# Patient Record
Sex: Male | Born: 1935 | Race: White | Hispanic: No | Marital: Married | Smoking: Former smoker
Health system: Southern US, Community
[De-identification: ages and names within clinical notes are randomized; demographics above are authoritative.]

## PROBLEM LIST (undated history)

## (undated) ENCOUNTER — Emergency Department (HOSPITAL_COMMUNITY): Disposition: A | Discharge status: Home / Self Care | Payer: Self-pay

## (undated) DIAGNOSIS — R0602 Shortness of breath: Secondary | ICD-10-CM

## (undated) DIAGNOSIS — N189 Chronic kidney disease, unspecified: Secondary | ICD-10-CM

## (undated) DIAGNOSIS — E669 Obesity, unspecified: Secondary | ICD-10-CM

## (undated) DIAGNOSIS — I2699 Other pulmonary embolism without acute cor pulmonale: Secondary | ICD-10-CM

## (undated) DIAGNOSIS — Z87898 Personal history of other specified conditions: Secondary | ICD-10-CM

## (undated) DIAGNOSIS — I2510 Atherosclerotic heart disease of native coronary artery without angina pectoris: Secondary | ICD-10-CM

## (undated) DIAGNOSIS — M1990 Unspecified osteoarthritis, unspecified site: Secondary | ICD-10-CM

## (undated) DIAGNOSIS — N390 Urinary tract infection, site not specified: Secondary | ICD-10-CM

## (undated) DIAGNOSIS — K5792 Diverticulitis of intestine, part unspecified, without perforation or abscess without bleeding: Secondary | ICD-10-CM

## (undated) DIAGNOSIS — C801 Malignant (primary) neoplasm, unspecified: Secondary | ICD-10-CM

## (undated) DIAGNOSIS — K56600 Partial intestinal obstruction, unspecified as to cause: Secondary | ICD-10-CM

## (undated) DIAGNOSIS — I639 Cerebral infarction, unspecified: Secondary | ICD-10-CM

## (undated) DIAGNOSIS — N400 Enlarged prostate without lower urinary tract symptoms: Secondary | ICD-10-CM

## (undated) DIAGNOSIS — K219 Gastro-esophageal reflux disease without esophagitis: Secondary | ICD-10-CM

## (undated) DIAGNOSIS — I519 Heart disease, unspecified: Secondary | ICD-10-CM

## (undated) DIAGNOSIS — R32 Unspecified urinary incontinence: Secondary | ICD-10-CM

## (undated) DIAGNOSIS — M810 Age-related osteoporosis without current pathological fracture: Secondary | ICD-10-CM

## (undated) DIAGNOSIS — I10 Essential (primary) hypertension: Secondary | ICD-10-CM

## (undated) DIAGNOSIS — G7000 Myasthenia gravis without (acute) exacerbation: Secondary | ICD-10-CM

## (undated) HISTORY — DX: Cerebral infarction, unspecified: I63.9

## (undated) HISTORY — DX: Gastro-esophageal reflux disease without esophagitis: K21.9

## (undated) HISTORY — DX: Atherosclerotic heart disease of native coronary artery without angina pectoris: I25.10

## (undated) HISTORY — PX: OTHER SURGICAL HISTORY: SHX169

## (undated) HISTORY — DX: Essential (primary) hypertension: I10

## (undated) HISTORY — DX: Partial intestinal obstruction, unspecified as to cause: K56.600

## (undated) HISTORY — PX: NEPHRECTOMY: SHX65

## (undated) HISTORY — PX: TOTAL KNEE ARTHROPLASTY: SHX125

## (undated) HISTORY — DX: Benign prostatic hyperplasia without lower urinary tract symptoms: N40.0

## (undated) HISTORY — DX: Myasthenia gravis without (acute) exacerbation: G70.00

## (undated) HISTORY — PX: EYE SURGERY: SHX253

## (undated) HISTORY — PX: TONSILLECTOMY: SUR1361

## (undated) HISTORY — PX: SP KYPHOPLASTY: HXRAD454

## (undated) HISTORY — PX: ANGIOPLASTY: SHX39

## (undated) HISTORY — DX: Heart disease, unspecified: I51.9

## (undated) HISTORY — PX: DOPPLER ECHOCARDIOGRAPHY: SHX263

## (undated) HISTORY — DX: Obesity, unspecified: E66.9

## (undated) HISTORY — DX: Diverticulitis of intestine, part unspecified, without perforation or abscess without bleeding: K57.92

## (undated) HISTORY — DX: Urinary tract infection, site not specified: N39.0

## (undated) HISTORY — PX: CARDIAC CATHETERIZATION: SHX172

## (undated) HISTORY — DX: Personal history of other specified conditions: Z87.898

## (undated) HISTORY — DX: Unspecified urinary incontinence: R32

## (undated) HISTORY — PX: JOINT REPLACEMENT: SHX530

## (undated) HISTORY — DX: Age-related osteoporosis without current pathological fracture: M81.0

---

## 1998-10-07 ENCOUNTER — Inpatient Hospital Stay (HOSPITAL_COMMUNITY): Admission: RE | Admit: 1998-10-07 | Discharge: 1998-10-11 | Payer: Self-pay | Admitting: Orthopaedic Surgery

## 1998-10-21 ENCOUNTER — Encounter (HOSPITAL_COMMUNITY): Admission: RE | Admit: 1998-10-21 | Discharge: 1999-01-19 | Payer: Self-pay | Admitting: Orthopaedic Surgery

## 1998-10-22 ENCOUNTER — Encounter: Admission: RE | Admit: 1998-10-22 | Discharge: 1998-11-10 | Payer: Self-pay | Admitting: Orthopaedic Surgery

## 1999-12-02 ENCOUNTER — Ambulatory Visit (HOSPITAL_COMMUNITY): Admission: RE | Admit: 1999-12-02 | Discharge: 1999-12-02 | Payer: Self-pay | Admitting: Gastroenterology

## 1999-12-02 ENCOUNTER — Encounter (INDEPENDENT_AMBULATORY_CARE_PROVIDER_SITE_OTHER): Payer: Self-pay | Admitting: Specialist

## 2000-10-06 ENCOUNTER — Encounter: Payer: Self-pay | Admitting: Urology

## 2000-10-06 ENCOUNTER — Encounter: Admission: RE | Admit: 2000-10-06 | Discharge: 2000-10-06 | Payer: Self-pay | Admitting: *Deleted

## 2001-01-11 ENCOUNTER — Encounter: Payer: Self-pay | Admitting: *Deleted

## 2001-01-12 ENCOUNTER — Inpatient Hospital Stay (HOSPITAL_COMMUNITY): Admission: AD | Admit: 2001-01-12 | Discharge: 2001-01-13 | Payer: Self-pay | Admitting: *Deleted

## 2001-04-06 ENCOUNTER — Emergency Department (HOSPITAL_COMMUNITY): Admission: EM | Admit: 2001-04-06 | Discharge: 2001-04-06 | Payer: Self-pay | Admitting: *Deleted

## 2001-08-20 ENCOUNTER — Emergency Department (HOSPITAL_COMMUNITY): Admission: EM | Admit: 2001-08-20 | Discharge: 2001-08-20 | Payer: Self-pay | Admitting: Emergency Medicine

## 2001-09-17 ENCOUNTER — Inpatient Hospital Stay (HOSPITAL_COMMUNITY): Admission: EM | Admit: 2001-09-17 | Discharge: 2001-09-19 | Payer: Self-pay | Admitting: Emergency Medicine

## 2001-09-17 ENCOUNTER — Encounter: Payer: Self-pay | Admitting: Emergency Medicine

## 2003-12-03 ENCOUNTER — Inpatient Hospital Stay (HOSPITAL_COMMUNITY): Admission: RE | Admit: 2003-12-03 | Discharge: 2003-12-10 | Payer: Self-pay | Admitting: Orthopaedic Surgery

## 2004-02-04 ENCOUNTER — Ambulatory Visit (HOSPITAL_COMMUNITY): Admission: RE | Admit: 2004-02-04 | Discharge: 2004-02-04 | Payer: Self-pay | Admitting: Ophthalmology

## 2004-02-24 ENCOUNTER — Encounter: Admission: RE | Admit: 2004-02-24 | Discharge: 2004-02-24 | Payer: Self-pay | Admitting: Neurology

## 2004-03-14 ENCOUNTER — Emergency Department (HOSPITAL_COMMUNITY): Admission: EM | Admit: 2004-03-14 | Discharge: 2004-03-14 | Payer: Self-pay | Admitting: Emergency Medicine

## 2004-05-25 ENCOUNTER — Emergency Department (HOSPITAL_COMMUNITY): Admission: EM | Admit: 2004-05-25 | Discharge: 2004-05-25 | Payer: Self-pay | Admitting: Emergency Medicine

## 2004-06-23 ENCOUNTER — Inpatient Hospital Stay (HOSPITAL_COMMUNITY): Admission: EM | Admit: 2004-06-23 | Discharge: 2004-07-03 | Payer: Self-pay | Admitting: *Deleted

## 2004-08-06 ENCOUNTER — Ambulatory Visit: Payer: Self-pay | Admitting: Family Medicine

## 2004-08-12 ENCOUNTER — Ambulatory Visit: Payer: Self-pay | Admitting: Family Medicine

## 2004-08-15 ENCOUNTER — Emergency Department (HOSPITAL_COMMUNITY): Admission: EM | Admit: 2004-08-15 | Discharge: 2004-08-16 | Payer: Self-pay | Admitting: Emergency Medicine

## 2004-08-28 ENCOUNTER — Ambulatory Visit: Payer: Self-pay | Admitting: Internal Medicine

## 2004-08-28 ENCOUNTER — Ambulatory Visit: Payer: Self-pay | Admitting: Pulmonary Disease

## 2004-08-28 ENCOUNTER — Ambulatory Visit: Payer: Self-pay | Admitting: Physical Medicine & Rehabilitation

## 2004-08-28 ENCOUNTER — Inpatient Hospital Stay (HOSPITAL_COMMUNITY): Admission: EM | Admit: 2004-08-28 | Discharge: 2004-09-08 | Payer: Self-pay | Admitting: Emergency Medicine

## 2004-08-31 ENCOUNTER — Ambulatory Visit: Payer: Self-pay | Admitting: Internal Medicine

## 2004-09-01 ENCOUNTER — Encounter (INDEPENDENT_AMBULATORY_CARE_PROVIDER_SITE_OTHER): Payer: Self-pay | Admitting: Specialist

## 2004-09-08 ENCOUNTER — Ambulatory Visit: Payer: Self-pay | Admitting: Psychology

## 2004-09-08 ENCOUNTER — Inpatient Hospital Stay
Admission: RE | Admit: 2004-09-08 | Discharge: 2004-09-23 | Payer: Self-pay | Admitting: Physical Medicine & Rehabilitation

## 2004-09-08 ENCOUNTER — Ambulatory Visit: Payer: Self-pay | Admitting: Physical Medicine & Rehabilitation

## 2004-10-16 ENCOUNTER — Ambulatory Visit: Payer: Self-pay | Admitting: Family Medicine

## 2004-11-17 ENCOUNTER — Inpatient Hospital Stay (HOSPITAL_COMMUNITY): Admission: EM | Admit: 2004-11-17 | Discharge: 2004-11-20 | Payer: Self-pay | Admitting: Emergency Medicine

## 2004-11-25 ENCOUNTER — Ambulatory Visit: Payer: Self-pay | Admitting: Family Medicine

## 2004-12-11 ENCOUNTER — Ambulatory Visit: Payer: Self-pay | Admitting: Family Medicine

## 2004-12-17 ENCOUNTER — Ambulatory Visit: Payer: Self-pay | Admitting: Family Medicine

## 2004-12-30 ENCOUNTER — Ambulatory Visit: Payer: Self-pay | Admitting: Family Medicine

## 2005-01-13 ENCOUNTER — Ambulatory Visit: Payer: Self-pay | Admitting: Family Medicine

## 2005-01-23 ENCOUNTER — Emergency Department (HOSPITAL_COMMUNITY): Admission: EM | Admit: 2005-01-23 | Discharge: 2005-01-23 | Payer: Self-pay | Admitting: Family Medicine

## 2005-01-28 ENCOUNTER — Ambulatory Visit: Payer: Self-pay | Admitting: Family Medicine

## 2005-03-02 ENCOUNTER — Ambulatory Visit: Payer: Self-pay | Admitting: Family Medicine

## 2005-03-29 ENCOUNTER — Ambulatory Visit: Payer: Self-pay | Admitting: Family Medicine

## 2005-04-26 ENCOUNTER — Ambulatory Visit: Payer: Self-pay | Admitting: Family Medicine

## 2005-05-14 ENCOUNTER — Ambulatory Visit: Payer: Self-pay | Admitting: Family Medicine

## 2005-05-27 ENCOUNTER — Ambulatory Visit: Payer: Self-pay | Admitting: Family Medicine

## 2005-05-28 ENCOUNTER — Ambulatory Visit: Payer: Self-pay | Admitting: Family Medicine

## 2005-06-10 ENCOUNTER — Ambulatory Visit (HOSPITAL_COMMUNITY): Admission: RE | Admit: 2005-06-10 | Discharge: 2005-06-10 | Payer: Self-pay | Admitting: Orthopaedic Surgery

## 2005-07-14 ENCOUNTER — Ambulatory Visit: Payer: Self-pay | Admitting: Family Medicine

## 2005-08-16 ENCOUNTER — Ambulatory Visit: Payer: Self-pay | Admitting: Family Medicine

## 2005-09-01 ENCOUNTER — Ambulatory Visit: Payer: Self-pay | Admitting: Internal Medicine

## 2005-09-01 ENCOUNTER — Inpatient Hospital Stay (HOSPITAL_COMMUNITY): Admission: EM | Admit: 2005-09-01 | Discharge: 2005-09-07 | Payer: Self-pay | Admitting: Emergency Medicine

## 2005-09-04 ENCOUNTER — Ambulatory Visit: Payer: Self-pay | Admitting: Internal Medicine

## 2005-09-06 ENCOUNTER — Ambulatory Visit: Payer: Self-pay | Admitting: Internal Medicine

## 2005-09-10 ENCOUNTER — Ambulatory Visit: Payer: Self-pay | Admitting: Family Medicine

## 2005-12-09 ENCOUNTER — Ambulatory Visit: Payer: Self-pay | Admitting: Family Medicine

## 2005-12-20 ENCOUNTER — Encounter: Payer: Self-pay | Admitting: Family Medicine

## 2006-01-21 ENCOUNTER — Ambulatory Visit: Payer: Self-pay | Admitting: Family Medicine

## 2006-01-26 ENCOUNTER — Encounter: Admission: RE | Admit: 2006-01-26 | Discharge: 2006-01-26 | Payer: Self-pay | Admitting: Family Medicine

## 2006-02-17 ENCOUNTER — Encounter: Payer: Self-pay | Admitting: Family Medicine

## 2006-06-06 ENCOUNTER — Emergency Department (HOSPITAL_COMMUNITY): Admission: EM | Admit: 2006-06-06 | Discharge: 2006-06-06 | Payer: Self-pay | Admitting: Emergency Medicine

## 2006-06-07 ENCOUNTER — Inpatient Hospital Stay (HOSPITAL_COMMUNITY): Admission: EM | Admit: 2006-06-07 | Discharge: 2006-06-09 | Payer: Self-pay | Admitting: *Deleted

## 2006-06-28 ENCOUNTER — Ambulatory Visit: Payer: Self-pay | Admitting: Family Medicine

## 2006-10-04 HISTORY — PX: OTHER SURGICAL HISTORY: SHX169

## 2006-10-04 HISTORY — PX: CARDIAC CATHETERIZATION: SHX172

## 2006-10-15 ENCOUNTER — Inpatient Hospital Stay (HOSPITAL_COMMUNITY): Admission: EM | Admit: 2006-10-15 | Discharge: 2006-10-18 | Payer: Self-pay | Admitting: Emergency Medicine

## 2006-11-09 ENCOUNTER — Observation Stay (HOSPITAL_COMMUNITY): Admission: EM | Admit: 2006-11-09 | Discharge: 2006-11-10 | Payer: Self-pay | Admitting: Emergency Medicine

## 2006-11-10 ENCOUNTER — Ambulatory Visit: Payer: Self-pay | Admitting: Internal Medicine

## 2006-11-16 ENCOUNTER — Ambulatory Visit: Payer: Self-pay | Admitting: Family Medicine

## 2007-03-27 DIAGNOSIS — Z8719 Personal history of other diseases of the digestive system: Secondary | ICD-10-CM | POA: Insufficient documentation

## 2007-03-27 DIAGNOSIS — I10 Essential (primary) hypertension: Secondary | ICD-10-CM

## 2007-03-27 DIAGNOSIS — K219 Gastro-esophageal reflux disease without esophagitis: Secondary | ICD-10-CM

## 2007-03-27 DIAGNOSIS — I2510 Atherosclerotic heart disease of native coronary artery without angina pectoris: Secondary | ICD-10-CM

## 2007-03-27 DIAGNOSIS — Z85528 Personal history of other malignant neoplasm of kidney: Secondary | ICD-10-CM | POA: Insufficient documentation

## 2007-03-27 DIAGNOSIS — G7000 Myasthenia gravis without (acute) exacerbation: Secondary | ICD-10-CM | POA: Insufficient documentation

## 2007-03-27 DIAGNOSIS — Z860100 Personal history of colon polyps, unspecified: Secondary | ICD-10-CM | POA: Insufficient documentation

## 2007-05-15 ENCOUNTER — Encounter: Payer: Self-pay | Admitting: Family Medicine

## 2007-05-18 ENCOUNTER — Encounter: Payer: Self-pay | Admitting: Family Medicine

## 2007-05-26 ENCOUNTER — Encounter: Payer: Self-pay | Admitting: Family Medicine

## 2007-05-29 ENCOUNTER — Encounter: Payer: Self-pay | Admitting: Family Medicine

## 2007-07-18 ENCOUNTER — Ambulatory Visit: Payer: Self-pay | Admitting: Family Medicine

## 2007-07-18 DIAGNOSIS — N400 Enlarged prostate without lower urinary tract symptoms: Secondary | ICD-10-CM

## 2007-07-19 ENCOUNTER — Telehealth: Payer: Self-pay | Admitting: Family Medicine

## 2007-07-24 ENCOUNTER — Telehealth: Payer: Self-pay | Admitting: Family Medicine

## 2007-10-26 ENCOUNTER — Ambulatory Visit: Payer: Self-pay | Admitting: Family Medicine

## 2007-10-26 DIAGNOSIS — D179 Benign lipomatous neoplasm, unspecified: Secondary | ICD-10-CM | POA: Insufficient documentation

## 2007-10-26 DIAGNOSIS — R252 Cramp and spasm: Secondary | ICD-10-CM | POA: Insufficient documentation

## 2008-03-12 ENCOUNTER — Telehealth: Payer: Self-pay | Admitting: Family Medicine

## 2008-03-25 ENCOUNTER — Encounter: Payer: Self-pay | Admitting: Family Medicine

## 2008-09-12 ENCOUNTER — Encounter: Payer: Self-pay | Admitting: Family Medicine

## 2008-09-30 ENCOUNTER — Telehealth: Payer: Self-pay | Admitting: Family Medicine

## 2008-10-03 ENCOUNTER — Encounter: Payer: Self-pay | Admitting: Family Medicine

## 2008-10-08 ENCOUNTER — Ambulatory Visit: Payer: Self-pay | Admitting: Family Medicine

## 2008-10-22 ENCOUNTER — Ambulatory Visit (HOSPITAL_COMMUNITY): Admission: RE | Admit: 2008-10-22 | Discharge: 2008-10-22 | Payer: Self-pay | Admitting: Orthopaedic Surgery

## 2008-10-25 ENCOUNTER — Encounter: Payer: Self-pay | Admitting: Family Medicine

## 2008-11-13 ENCOUNTER — Encounter: Payer: Self-pay | Admitting: Family Medicine

## 2008-12-27 ENCOUNTER — Encounter: Payer: Self-pay | Admitting: Family Medicine

## 2009-01-15 ENCOUNTER — Encounter: Payer: Self-pay | Admitting: Family Medicine

## 2009-01-20 ENCOUNTER — Encounter: Payer: Self-pay | Admitting: Family Medicine

## 2009-01-29 ENCOUNTER — Encounter: Payer: Self-pay | Admitting: Family Medicine

## 2009-02-04 ENCOUNTER — Encounter: Payer: Self-pay | Admitting: Family Medicine

## 2009-04-29 ENCOUNTER — Telehealth: Payer: Self-pay | Admitting: Family Medicine

## 2009-05-20 ENCOUNTER — Emergency Department (HOSPITAL_COMMUNITY): Admission: EM | Admit: 2009-05-20 | Discharge: 2009-05-20 | Payer: Self-pay | Admitting: Family Medicine

## 2009-06-11 ENCOUNTER — Ambulatory Visit: Payer: Self-pay | Admitting: Family Medicine

## 2009-06-11 DIAGNOSIS — B356 Tinea cruris: Secondary | ICD-10-CM

## 2009-06-11 DIAGNOSIS — H9190 Unspecified hearing loss, unspecified ear: Secondary | ICD-10-CM | POA: Insufficient documentation

## 2009-07-20 ENCOUNTER — Emergency Department (HOSPITAL_COMMUNITY): Admission: EM | Admit: 2009-07-20 | Discharge: 2009-07-20 | Payer: Self-pay | Admitting: Family Medicine

## 2009-08-13 ENCOUNTER — Ambulatory Visit: Payer: Self-pay | Admitting: Family Medicine

## 2009-08-13 DIAGNOSIS — R079 Chest pain, unspecified: Secondary | ICD-10-CM | POA: Insufficient documentation

## 2009-08-13 LAB — CONVERTED CEMR LAB: Specific Gravity, Urine: 1.01

## 2009-08-14 ENCOUNTER — Ambulatory Visit: Payer: Self-pay | Admitting: Family Medicine

## 2009-08-14 ENCOUNTER — Telehealth: Payer: Self-pay | Admitting: Family Medicine

## 2009-09-01 ENCOUNTER — Encounter (INDEPENDENT_AMBULATORY_CARE_PROVIDER_SITE_OTHER): Payer: Self-pay | Admitting: *Deleted

## 2009-09-24 ENCOUNTER — Inpatient Hospital Stay (HOSPITAL_COMMUNITY): Admission: EM | Admit: 2009-09-24 | Discharge: 2009-09-29 | Payer: Self-pay | Admitting: Emergency Medicine

## 2009-10-16 ENCOUNTER — Emergency Department (HOSPITAL_COMMUNITY): Admission: EM | Admit: 2009-10-16 | Discharge: 2009-10-16 | Payer: Self-pay | Admitting: Emergency Medicine

## 2009-10-27 ENCOUNTER — Ambulatory Visit: Payer: Self-pay | Admitting: Family Medicine

## 2009-10-27 DIAGNOSIS — L989 Disorder of the skin and subcutaneous tissue, unspecified: Secondary | ICD-10-CM | POA: Insufficient documentation

## 2009-10-27 DIAGNOSIS — M1990 Unspecified osteoarthritis, unspecified site: Secondary | ICD-10-CM | POA: Insufficient documentation

## 2009-10-27 DIAGNOSIS — A088 Other specified intestinal infections: Secondary | ICD-10-CM

## 2009-11-11 ENCOUNTER — Encounter: Payer: Self-pay | Admitting: Family Medicine

## 2009-11-17 ENCOUNTER — Encounter: Payer: Self-pay | Admitting: Family Medicine

## 2009-12-11 ENCOUNTER — Ambulatory Visit: Payer: Self-pay | Admitting: Family Medicine

## 2009-12-11 DIAGNOSIS — R05 Cough: Secondary | ICD-10-CM

## 2010-01-29 ENCOUNTER — Encounter: Payer: Self-pay | Admitting: Family Medicine

## 2010-02-06 ENCOUNTER — Encounter: Payer: Self-pay | Admitting: Family Medicine

## 2010-03-18 ENCOUNTER — Telehealth: Payer: Self-pay | Admitting: Family Medicine

## 2010-03-31 ENCOUNTER — Encounter: Payer: Self-pay | Admitting: Family Medicine

## 2010-04-01 ENCOUNTER — Encounter: Payer: Self-pay | Admitting: Family Medicine

## 2010-04-16 ENCOUNTER — Ambulatory Visit: Payer: Self-pay | Admitting: Family Medicine

## 2010-04-16 ENCOUNTER — Inpatient Hospital Stay (HOSPITAL_COMMUNITY): Admission: EM | Admit: 2010-04-16 | Discharge: 2010-04-21 | Payer: Self-pay | Admitting: Emergency Medicine

## 2010-04-16 DIAGNOSIS — R109 Unspecified abdominal pain: Secondary | ICD-10-CM | POA: Insufficient documentation

## 2010-04-16 DIAGNOSIS — K56600 Partial intestinal obstruction, unspecified as to cause: Secondary | ICD-10-CM

## 2010-04-16 HISTORY — DX: Partial intestinal obstruction, unspecified as to cause: K56.600

## 2010-04-16 LAB — CONVERTED CEMR LAB: Hemoglobin: 15.4 g/dL

## 2010-05-20 ENCOUNTER — Encounter: Payer: Self-pay | Admitting: Family Medicine

## 2010-05-28 ENCOUNTER — Ambulatory Visit (HOSPITAL_COMMUNITY): Admission: RE | Admit: 2010-05-28 | Discharge: 2010-05-28 | Payer: Self-pay | Admitting: Gastroenterology

## 2010-05-28 HISTORY — PX: COLONOSCOPY: SHX174

## 2010-08-11 ENCOUNTER — Ambulatory Visit: Payer: Self-pay | Admitting: Family Medicine

## 2010-09-12 ENCOUNTER — Emergency Department (HOSPITAL_COMMUNITY)
Admission: EM | Admit: 2010-09-12 | Discharge: 2010-09-12 | Payer: Self-pay | Source: Home / Self Care | Admitting: Family Medicine

## 2010-09-16 ENCOUNTER — Encounter: Payer: Self-pay | Admitting: Family Medicine

## 2010-09-17 ENCOUNTER — Encounter: Payer: Self-pay | Admitting: Family Medicine

## 2010-10-01 ENCOUNTER — Emergency Department (HOSPITAL_COMMUNITY)
Admission: EM | Admit: 2010-10-01 | Discharge: 2010-10-01 | Payer: Self-pay | Source: Home / Self Care | Admitting: Family Medicine

## 2010-10-16 ENCOUNTER — Ambulatory Visit
Admission: RE | Admit: 2010-10-16 | Discharge: 2010-10-16 | Payer: Self-pay | Source: Home / Self Care | Attending: Family Medicine | Admitting: Family Medicine

## 2010-10-23 ENCOUNTER — Encounter: Payer: Self-pay | Admitting: Family Medicine

## 2010-11-03 NOTE — Letter (Signed)
Summary: [facility]  [facility]   Imported By: Sherian Rein 03/20/2010 14:50:55  _____________________________________________________________________  External Attachment:    Type:   Image     Comment:   External Document

## 2010-11-03 NOTE — Letter (Signed)
Summary: [facility]   Imported By: Maryln Gottron 05/28/2010 15:03:33  _____________________________________________________________________  External Attachment:    Type:   Image     Comment:   External Document

## 2010-11-03 NOTE — Assessment & Plan Note (Signed)
Summary: GI ILLNESS ? (PT C/O STOMACH ACHE, NAUSEA) // RS   Vital Signs:  Patient profile:   77 year old male Weight:      272 pounds O2 Sat:      94 % Temp:     97.7 degrees F Pulse rate:   92 / minute BP sitting:   150 / 80  (left arm) Cuff size:   large  Vitals Entered By: Pura Spice, RN (August 11, 2010 2:34 PM) CC: aching all over c/o pain rt shoulder to rt side.    History of Present Illness: here for one week of sharp pains in the right rib area. No SOB, no abdominal pains. No change in BMs or urinations. No recent trauma. This is similar to some pains he had one year ago. Our workup at that time was unrevealing (normal Xrays), and it was felt to be a muscular pain.   Allergies: 1)  ! * Dicyclomine  Past History:  Past Medical History: Coronary artery disease (Dr. Julien Nordmann) Hypertension Diverticulitis, hx of GERD Benign prostatic hypertrophy myasthenia gravis (Dr. Anne Hahn) partial small bowel obstruction 04-16-10, resolved with bowel rest  Past Surgical History: Total knee replacements, both (sees Dr. Margreta Journey) Nephrectomy left Angioplasty & had 2 stent replacements stent placed 1/08 colonoscopy 05-28-10 per Dr. Carman Ching, diverticulosis, no repeats planned   Review of Systems  The patient denies anorexia, fever, weight loss, weight gain, vision loss, decreased hearing, hoarseness, syncope, dyspnea on exertion, peripheral edema, prolonged cough, headaches, hemoptysis, abdominal pain, melena, hematochezia, severe indigestion/heartburn, hematuria, incontinence, genital sores, muscle weakness, suspicious skin lesions, transient blindness, difficulty walking, depression, unusual weight change, abnormal bleeding, enlarged lymph nodes, angioedema, breast masses, and testicular masses.         Flu Vaccine Consent Questions     Do you have a history of severe allergic reactions to this vaccine? no    Any prior history of allergic reactions to egg and/or  gelatin? no    Do you have a sensitivity to the preservative Thimersol? no    Do you have a past history of Guillan-Barre Syndrome? no    Do you currently have an acute febrile illness? no    Have you ever had a severe reaction to latex? no    Vaccine information given and explained to patient? yes    Are you currently pregnant? no    Lot Number:[id number]   Exp Date:04/03/2011   Site Given  Left Deltoid IM Pura Spice, RN  August 11, 2010 2:43 PM   Physical Exam  General:  Well-developed,well-nourished,in no acute distress; alert,appropriate and cooperative throughout examination Chest Wall:  tender along the right lateral ribs under the axilla Lungs:  Normal respiratory effort, chest expands symmetrically. Lungs are clear to auscultation, no crackles or wheezes. Heart:  Normal rate and regular rhythm. S1 and S2 normal without gallop, murmur, click, rub or other extra sounds. Abdomen:  Bowel sounds positive,abdomen soft and non-tender without masses, organomegaly or hernias noted. Skin:  Intact without suspicious lesions or rashes   Impression & Recommendations:  Problem # 1:  RIB PAIN, RIGHT SIDED (ICD-786.50)  Complete Medication List: 1)  Pepcid 20 Mg Tabs (Famotidine) .Marland Kitchen.. 1 by mouth two times a day 2)  Toprol Xl 25 Mg Tb24 (Metoprolol succinate) .... Qhs 3)  Aspirin 325 Mg Tabs (Aspirin) .Marland Kitchen.. 1 qd 4)  Mestinon 60 Mg Tabs (Pyridostigmine bromide) .Marland Kitchen.. 1 by mouth once daily 5)  Cellcept 500 Mg Tabs (  Mycophenolate mofetil) .Marland Kitchen.. 1 by mouth once daily 6)  Oscal 500/200 D-3 500-200 Mg-unit Tabs (Calcium-vitamin d) .Marland Kitchen.. 1 by mouth once daily 7)  Vicodin 5-500 Mg Tabs (Hydrocodone-acetaminophen) .Marland Kitchen.. 1 every 6 hours as needed pain 8)  Bentyl 20 Mg Tabs (Dicyclomine hcl) .Marland Kitchen.. 1 by mouth three times a day as needed fro cramps 9)  Miralax Powd (Polyethylene glycol 3350) .Marland Kitchen.. 17g by mouth once daily as needed constipation 10)  Combivent 103-18 Mcg/act Aero (Albuterol-ipratropium)  .... 2 inh. q4-6h as needed 11)  Promethazine Hcl 25 Mg Tabs (Promethazine hcl) .Marland Kitchen.. 1 q4h as needed nausea  Other Orders: Flu Vaccine 20yrs + MEDICARE PATIENTS (Q0347) Administration Flu vaccine - MCR (Q2595)  Patient Instructions: 1)  rest, Vicodin as needed . 2)  Please schedule a follow-up appointment as needed .    Orders Added: 1)  Flu Vaccine 55yrs + MEDICARE PATIENTS [Q2039] 2)  Administration Flu vaccine - MCR [G0008] 3)  Est. Patient Level IV [63875]

## 2010-11-03 NOTE — Letter (Signed)
Summary: [facility]  [facility]   Imported By: Maryln Gottron 11/26/2009 15:50:30  _____________________________________________________________________  External Attachment:    Type:   Image     Comment:   External Document

## 2010-11-03 NOTE — Letter (Signed)
Summary: [facility]  [facility]   Imported By: Sherian Rein 03/20/2010 14:51:50  _____________________________________________________________________  External Attachment:    Type:   Image     Comment:   External Document

## 2010-11-03 NOTE — Progress Notes (Signed)
Summary: new RX  Phone Note Call from Patient   Caller: Patient Call For: Nelwyn Salisbury MD Summary of Call: Pt's neurologist stopped the Dicylcomine, and he wants a new RX for nausea and abdominal pain sent to [facility].  Not GERD. [phone] [facility] Initial call taken by: Lynann Beaver CMA,  March 18, 2010 11:06 AM  Follow-up for Phone Call        call in Phenergan 25 mg tabs, use q 4 hours as needed nausea, #60 with 5 rf Follow-up by: Nelwyn Salisbury MD,  March 19, 2010 8:03 AM   New Allergies: ! * DICYCLOMINE New/Updated Medications: PROMETHAZINE HCL 25 MG TABS (PROMETHAZINE HCL) 1 q4h as needed nausea New Allergies: ! * DICYCLOMINEPrescriptions: PROMETHAZINE HCL 25 MG TABS (PROMETHAZINE HCL) 1 q4h as needed nausea  #60 x 5   Entered by:   Raechel Ache, RN   Authorized by:   Nelwyn Salisbury MD   Signed by:   Raechel Ache, RN on 03/19/2010   Method used:   Historical   RxID:   [id number]

## 2010-11-03 NOTE — Letter (Signed)
Summary: [facility]  [facility]   Imported By: Maryln Gottron 04/07/2010 13:44:51  _____________________________________________________________________  External Attachment:    Type:   Image     Comment:   External Document

## 2010-11-03 NOTE — Consult Note (Signed)
Summary: [facility]  [facility]   Imported By: Maryln Gottron 11/21/2009 15:34:11  _____________________________________________________________________  External Attachment:    Type:   Image     Comment:   External Document

## 2010-11-03 NOTE — Assessment & Plan Note (Signed)
Summary: follow up/cjr   Vital Signs:  Patient profile:   77 year old male Weight:      266 pounds Temp:     97.9 degrees F oral Pulse rate:   99 / minute BP sitting:   132 / 74  (left arm) Cuff size:   large  Vitals Entered By: Alfred Levins, CMA (October 27, 2009 10:43 AM) CC: hospital f/u, referral to dematologist   History of Present Illness: Here to follow up on a hospital stay from 09-24-09 to 09-29-09 for viral gastroenteritis. He had diarrhea and was dehydrated, but he recovered well with some IV fluids and antibiotics. Now he feels fine with normal BMs. He needs refills on his pain meds, and he asks about seeing a dermatologist. he has a number of skin lesions which he is concerned about.   Current Medications (verified): 1)  Pepcid 20 Mg Tabs (Famotidine) .Marland Kitchen.. 1 By Mouth Two Times A Day 2)  Toprol Xl 25 Mg Tb24 (Metoprolol Succinate) .... Qhs 3)  Aspirin 325 Mg Tabs (Aspirin) .Marland Kitchen.. 1 Qd 4)  Mestinon 60 Mg  Tabs (Pyridostigmine Bromide) .Marland Kitchen.. 1 By Mouth Once Daily 5)  Cellcept 500 Mg  Tabs (Mycophenolate Mofetil) .Marland Kitchen.. 1 By Mouth Once Daily 6)  Fosamax 70 Mg Tabs (Alendronate Sodium) .Marland Kitchen.. 1 By Mouth Once Daily 7)  Oscal 500/200 D-3 500-200 Mg-Unit  Tabs (Calcium-Vitamin D) .Marland Kitchen.. 1 By Mouth Once Daily 8)  Vicodin 5-500 Mg Tabs (Hydrocodone-Acetaminophen) .Marland Kitchen.. 1 Every 6 Hours As Needed Pain 9)  Bentyl 20 Mg  Tabs (Dicyclomine Hcl) .Marland Kitchen.. 1 By Mouth Three Times A Day As Needed Fro Cramps 10)  Miralax   Powd (Polyethylene Glycol 3350) .Marland Kitchen.. 17g By Mouth Once Daily As Needed Constipation 11)  Combivent 103-18 Mcg/act Aero (Albuterol-Ipratropium) .... 2 Inh. Q4-6h As Needed  Allergies (verified): No Known Drug Allergies  Past History:  Past Medical History: Reviewed history from 06/11/2009 and no changes required. Coronary artery disease (Dr. Julien Nordmann) Hypertension Diverticulitis, hx of GERD Benign prostatic hypertrophy myasthenia gravis (Dr. Anne Hahn)  Past Surgical  History: Reviewed history from 10/08/2008 and no changes required. Total knee replacements, both (sees Dr. Margreta Journey) Nephrectomy left Angioplasty & had 2 stent replacements stent placed 1/08  Review of Systems  The patient denies anorexia, fever, weight loss, weight gain, vision loss, decreased hearing, hoarseness, chest pain, syncope, dyspnea on exertion, peripheral edema, prolonged cough, headaches, hemoptysis, abdominal pain, melena, hematochezia, severe indigestion/heartburn, hematuria, incontinence, genital sores, muscle weakness, suspicious skin lesions, transient blindness, difficulty walking, depression, unusual weight change, abnormal bleeding, enlarged lymph nodes, angioedema, breast masses, and testicular masses.    Physical Exam  General:  overweight-appearing.   Lungs:  Normal respiratory effort, chest expands symmetrically. Lungs are clear to auscultation, no crackles or wheezes. Heart:  Normal rate and regular rhythm. S1 and S2 normal without gallop, murmur, click, rub or other extra sounds. Abdomen:  Bowel sounds positive,abdomen soft and non-tender without masses, organomegaly or hernias noted. Skin:  multiple seborrheic keratoses and nevi over the body.    Impression & Recommendations:  Problem # 1:  VIRAL GASTROENTERITIS (ICD-008.8) Assessment Improved  Problem # 2:  SKIN LESIONS, MULTIPLE (ICD-709.9) Assessment: Unchanged  Orders: Dermatology Referral (Derma)  Problem # 3:  HYPERTENSION (ICD-401.9) Assessment: Unchanged  His updated medication list for this problem includes:    Toprol Xl 25 Mg Tb24 (Metoprolol succinate) ..... Qhs  Problem # 4:  CORONARY ARTERY DISEASE (ICD-414.00) Assessment: Unchanged  The following medications were removed from  the medication list:    Plavix 75 Mg Tabs (Clopidogrel bisulfate) ..... Qd His updated medication list for this problem includes:    Toprol Xl 25 Mg Tb24 (Metoprolol succinate) ..... Qhs    Aspirin 325 Mg Tabs  (Aspirin) .Marland Kitchen... 1 qd  Problem # 5:  DEGENERATIVE JOINT DISEASE (ICD-715.90) Assessment: Unchanged  His updated medication list for this problem includes:    Aspirin 325 Mg Tabs (Aspirin) .Marland Kitchen... 1 qd    Vicodin 5-500 Mg Tabs (Hydrocodone-acetaminophen) .Marland Kitchen... 1 every 6 hours as needed pain  Complete Medication List: 1)  Pepcid 20 Mg Tabs (Famotidine) .Marland Kitchen.. 1 by mouth two times a day 2)  Toprol Xl 25 Mg Tb24 (Metoprolol succinate) .... Qhs 3)  Aspirin 325 Mg Tabs (Aspirin) .Marland Kitchen.. 1 qd 4)  Mestinon 60 Mg Tabs (Pyridostigmine bromide) .Marland Kitchen.. 1 by mouth once daily 5)  Cellcept 500 Mg Tabs (Mycophenolate mofetil) .Marland Kitchen.. 1 by mouth once daily 6)  Fosamax 70 Mg Tabs (Alendronate sodium) .Marland Kitchen.. 1 by mouth once daily 7)  Oscal 500/200 D-3 500-200 Mg-unit Tabs (Calcium-vitamin d) .Marland Kitchen.. 1 by mouth once daily 8)  Vicodin 5-500 Mg Tabs (Hydrocodone-acetaminophen) .Marland Kitchen.. 1 every 6 hours as needed pain 9)  Bentyl 20 Mg Tabs (Dicyclomine hcl) .Marland Kitchen.. 1 by mouth three times a day as needed fro cramps 10)  Miralax Powd (Polyethylene glycol 3350) .Marland Kitchen.. 17g by mouth once daily as needed constipation 11)  Combivent 103-18 Mcg/act Aero (Albuterol-ipratropium) .... 2 inh. q4-6h as needed  Patient Instructions: 1)  refer to Dermatology Prescriptions: VICODIN 5-500 MG TABS (HYDROCODONE-ACETAMINOPHEN) 1 every 6 hours as needed pain  #120 x 5   Entered and Authorized by:   Nelwyn Salisbury MD   Signed by:   Nelwyn Salisbury MD on 10/27/2009   Method used:   Print then Give to Patient   RxID:   [id number]

## 2010-11-03 NOTE — Letter (Signed)
Summary: [facility]  [facility]   Imported By: Maryln Gottron 02/16/2010 12:51:05  _____________________________________________________________________  External Attachment:    Type:   Image     Comment:   External Document

## 2010-11-03 NOTE — Letter (Signed)
Summary: [facility]  [facility]   Imported By: Maryln Gottron 02/19/2010 12:45:46  _____________________________________________________________________  External Attachment:    Type:   Image     Comment:   External Document

## 2010-11-03 NOTE — Assessment & Plan Note (Signed)
Summary: V/D/dm   Vital Signs:  Patient profile:   77 year old male Weight:      271 pounds Temp:     97.4 degrees F oral BP sitting:   130 / 74  (left arm) Cuff size:   regular  Vitals Entered By: Kern Reap CMA Duncan Dull) (April 16, 2010 2:55 PM) CC: nausea, diarrhea   CC:  nausea and diarrhea.  History of Present Illness: [name] is 77 year old male, who comes in today for evaluation of abdominal bloating, nausea, no vomiting, and to bright red stools.  Today.  He has a history of renal cell carcinoma is under current chemotherapy with CellCept.  Today, he began having abdominal bloating, nausea, no vomiting, and to bright red stools.  Hemoglobin unchanged at 15  Allergies: 1)  ! * Dicyclomine  Past History:  Past medical, surgical, family and social histories (including risk factors) reviewed, and no changes noted (except as noted below).  Past Medical History: Reviewed history from 06/11/2009 and no changes required. Coronary artery disease (Dr. Julien Nordmann) Hypertension Diverticulitis, hx of GERD Benign prostatic hypertrophy myasthenia gravis (Dr. Anne Hahn)  Past Surgical History: Reviewed history from 10/08/2008 and no changes required. Total knee replacements, both (sees Dr. Margreta Journey) Nephrectomy left Angioplasty & had 2 stent replacements stent placed 1/08  Family History: Reviewed history from 06/11/2009 and no changes required. unremarkable  Social History: Reviewed history from 06/11/2009 and no changes required. Never Smoked Alcohol use-no  Review of Systems      See HPI  Physical Exam  General:  elderly male, confused about his medication however, he feels bloated nauseated Abdomen:  the abdomen is obese.  Bowel sounds are not audible.  There is diffuse tenderness no rebound.  No stool in the rectum.  Guaiac-positive Rectal:  No external abnormalities noted. Normal sphincter tone. No rectal masses or tenderness.   Problems:  Medical Problems  Added: 1)  Dx of Abdominal Pain, Acute  (ICD-789.00) 2)  Dx of Abdominal Pain, Lower  (ICD-789.09)  Impression & Recommendations:  Problem # 1:  ABDOMINAL PAIN, ACUTE (ICD-789.00) Assessment New  Complete Medication List: 1)  Pepcid 20 Mg Tabs (Famotidine) .Marland Kitchen.. 1 by mouth two times a day 2)  Toprol Xl 25 Mg Tb24 (Metoprolol succinate) .... Qhs 3)  Aspirin 325 Mg Tabs (Aspirin) .Marland Kitchen.. 1 qd 4)  Mestinon 60 Mg Tabs (Pyridostigmine bromide) .Marland Kitchen.. 1 by mouth once daily 5)  Cellcept 500 Mg Tabs (Mycophenolate mofetil) .Marland Kitchen.. 1 by mouth once daily 6)  Oscal 500/200 D-3 500-200 Mg-unit Tabs (Calcium-vitamin d) .Marland Kitchen.. 1 by mouth once daily 7)  Vicodin 5-500 Mg Tabs (Hydrocodone-acetaminophen) .Marland Kitchen.. 1 every 6 hours as needed pain 8)  Bentyl 20 Mg Tabs (Dicyclomine hcl) .Marland Kitchen.. 1 by mouth three times a day as needed fro cramps 9)  Miralax Powd (Polyethylene glycol 3350) .Marland Kitchen.. 17g by mouth once daily as needed constipation 10)  Combivent 103-18 Mcg/act Aero (Albuterol-ipratropium) .... 2 inh. q4-6h as needed 11)  Promethazine Hcl 25 Mg Tabs (Promethazine hcl) .Marland Kitchen.. 1 q4h as needed nausea  Other Orders: Hgb (16109)  Patient Instructions: 1)  go directly to [place] emergency room......... daughter will takehim   Laboratory Results   Blood Tests   Date/Time Received: April 16, 2010     CBC   HGB:  15.4 g/dL   (Normal Range: 60.4-54.0 in Males, 12.0-15.0 in Females) Comments: Kern Reap CMA Duncan Dull)  April 16, 2010 3:16 PM

## 2010-11-03 NOTE — Assessment & Plan Note (Signed)
Summary: uri/dm   Vital Signs:  Patient profile:   77 year old male O2 Sat:      92 % on Room air Temp:     98.9 degrees F oral Pulse rhythm:   regular BP sitting:   120 / 64  (left arm) Cuff size:   large  Vitals Entered By: Sid Falcon LPN (December 11, 2009 2:25 PM)  O2 Flow:  Room air CC: URI, SOB   History of Present Illness: Acute visit. Patient has a two-day history of cough which is mostly nonproductive and some nasal congestion. No fever or chills. Denies any pleuritic pain or hemoptysis. He has some dyspnea with exertion and not clear if  this is different from baseline. Denies any chest pain. Remote history of pulmonary emboli but these symptoms are different.  Chronic problems include remote history of renal cell carcinoma 1991, myasthenia gravis, hypertension, and history of CAD. Denies any orthopnea or PND. No chest pressure or pain. Smoked only briefly several years ago. No known COPD.  Allergies (verified): No Known Drug Allergies  Past History:  Past Medical History: Last updated: 06/11/2009 Coronary artery disease (Dr. Julien Nordmann) Hypertension Diverticulitis, hx of GERD Benign prostatic hypertrophy myasthenia gravis (Dr. Anne Hahn)  Past Surgical History: Last updated: 10/08/2008 Total knee replacements, both (sees Dr. Margreta Journey) Nephrectomy left Angioplasty & had 2 stent replacements stent placed 1/08  Social History: Last updated: 06/11/2009 Never Smoked Alcohol use-no PMH-FH-SH reviewed for relevance  Review of Systems      See HPI  Physical Exam  General:  Well-developed,well-nourished,in no acute distress; alert,appropriate and cooperative throughout examination Ears:  External ear exam shows no significant lesions or deformities.  Otoscopic examination reveals clear canals, tympanic membranes are intact bilaterally without bulging, retraction, inflammation or discharge. Hearing is grossly normal bilaterally. Mouth:  Oral mucosa and  oropharynx without lesions or exudates.  Teeth in good repair. Lungs:  slightly diminished breath sounds left upper lobe and mid lobe region c/w right but no rales or wheezes. No retractions. Heart:  normal rate and regular rhythm.   Extremities:  no pitting edema noted   Impression & Recommendations:  Problem # 1:  COUGH (ICD-786.2) I have recommended chest x-ray to further evaluate particularly given his age, multiple comorbidities, and somewhat subtle asymmetric breath sounds but patient refuses at this time. He does agree to start antibiotic and consider chest x-ray if not having prompt improvement.  Complete Medication List: 1)  Pepcid 20 Mg Tabs (Famotidine) .Marland Kitchen.. 1 by mouth two times a day 2)  Toprol Xl 25 Mg Tb24 (Metoprolol succinate) .... Qhs 3)  Aspirin 325 Mg Tabs (Aspirin) .Marland Kitchen.. 1 qd 4)  Mestinon 60 Mg Tabs (Pyridostigmine bromide) .Marland Kitchen.. 1 by mouth once daily 5)  Cellcept 500 Mg Tabs (Mycophenolate mofetil) .Marland Kitchen.. 1 by mouth once daily 6)  Fosamax 70 Mg Tabs (Alendronate sodium) .Marland Kitchen.. 1 by mouth once daily 7)  Oscal 500/200 D-3 500-200 Mg-unit Tabs (Calcium-vitamin d) .Marland Kitchen.. 1 by mouth once daily 8)  Vicodin 5-500 Mg Tabs (Hydrocodone-acetaminophen) .Marland Kitchen.. 1 every 6 hours as needed pain 9)  Bentyl 20 Mg Tabs (Dicyclomine hcl) .Marland Kitchen.. 1 by mouth three times a day as needed fro cramps 10)  Miralax Powd (Polyethylene glycol 3350) .Marland Kitchen.. 17g by mouth once daily as needed constipation 11)  Combivent 103-18 Mcg/act Aero (Albuterol-ipratropium) .... 2 inh. q4-6h as needed 12)  Levaquin 500 Mg Tabs (Levofloxacin) .... One by mouth once daily for 10 days  Patient Instructions: 1)  followup  with your primary physician immediately if you have any increased fever, increased shortness of breath, or any new symptoms Prescriptions: LEVAQUIN 500 MG TABS (LEVOFLOXACIN) one by mouth once daily for 10 days  #10 x 0   Entered and Authorized by:   Evelena Peat MD   Signed by:   Evelena Peat MD on  12/11/2009   Method used:   Electronically to        [facility]* (retail)       [address]       [place], [place]  [id number]       Ph: [phone]       Fax: [phone]   RxID:   [id number]

## 2010-11-05 NOTE — Letter (Signed)
Summary: [facility]  [facility]   Imported By: Maryln Gottron 09/29/2010 12:11:16  _____________________________________________________________________  External Attachment:    Type:   Image     Comment:   External Document

## 2010-11-05 NOTE — Letter (Signed)
Summary: [facility]  [facility]   Imported By: Maryln Gottron 10/07/2010 13:17:13  _____________________________________________________________________  External Attachment:    Type:   Image     Comment:   External Document

## 2010-11-05 NOTE — Assessment & Plan Note (Signed)
Summary: COUGH.//SLM   Vital Signs:  Patient profile:   77 year old male Temp:     98.1 degrees F BP sitting:   150 / 82  (left arm) Cuff size:   large  Vitals Entered By: Sid Falcon LPN (October 16, 2010 4:13 PM)  History of Present Illness: Cough productive of white sputum for 2 days No fever.  Mild sore throat.  Ex-smoker. No sick contacts.  No meds for cough. Also has minimal nasal congestion.  Denies dyspnea, hemoptysis, pleuritic pain.  Hx myasthenia gravis followed by neurology.  Allergies: 1)  ! * Dicyclomine  Past History:  Past Medical History: Last updated: 08/11/2010 Coronary artery disease (Dr. Julien Nordmann) Hypertension Diverticulitis, hx of GERD Benign prostatic hypertrophy myasthenia gravis (Dr. Anne Hahn) partial small bowel obstruction 04-16-10, resolved with bowel rest  Social History: Last updated: 06/11/2009 Never Smoked Alcohol use-no PMH reviewed for relevance, SH/Risk Factors reviewed for relevance  Physical Exam  General:  Well-developed,well-nourished,in no acute distress; alert,appropriate and cooperative throughout examination Ears:  External ear exam shows no significant lesions or deformities.  Otoscopic examination reveals clear canals, tympanic membranes are intact bilaterally without bulging, retraction, inflammation or discharge. Hearing is grossly normal bilaterally. Mouth:  Oral mucosa and oropharynx without lesions or exudates.  Teeth in good repair. Neck:  No deformities, masses, or tenderness noted. Lungs:  Normal respiratory effort, chest expands symmetrically. Lungs are clear to auscultation, no crackles or wheezes. Heart:  Normal rate and regular rhythm. S1 and S2 normal without gallop, murmur, click, rub or other extra sounds.   Impression & Recommendations:  Problem # 1:  ACUTE BRONCHITIS (ICD-466.0) explained very likely viral.  Tessalon for cough suppression His updated medication list for this problem includes:   Combivent 103-18 Mcg/act Aero (Albuterol-ipratropium) .Marland Kitchen... 2 inh. q4-6h as needed    Benzonatate 200 Mg Caps (Benzonatate) ..... One by mouth q 8 hours as needed cough  Complete Medication List: 1)  Pepcid 20 Mg Tabs (Famotidine) .Marland Kitchen.. 1 by mouth two times a day 2)  Toprol Xl 25 Mg Tb24 (Metoprolol succinate) .... Qhs 3)  Aspirin 325 Mg Tabs (Aspirin) .Marland Kitchen.. 1 qd 4)  Mestinon 60 Mg Tabs (Pyridostigmine bromide) .Marland Kitchen.. 1 by mouth once daily 5)  Cellcept 500 Mg Tabs (Mycophenolate mofetil) .Marland Kitchen.. 1 by mouth once daily 6)  Oscal 500/200 D-3 500-200 Mg-unit Tabs (Calcium-vitamin d) .Marland Kitchen.. 1 by mouth once daily 7)  Vicodin 5-500 Mg Tabs (Hydrocodone-acetaminophen) .Marland Kitchen.. 1 every 6 hours as needed pain 8)  Bentyl 20 Mg Tabs (Dicyclomine hcl) .Marland Kitchen.. 1 by mouth three times a day as needed fro cramps 9)  Miralax Powd (Polyethylene glycol 3350) .Marland Kitchen.. 17g by mouth once daily as needed constipation 10)  Combivent 103-18 Mcg/act Aero (Albuterol-ipratropium) .... 2 inh. q4-6h as needed 11)  Promethazine Hcl 25 Mg Tabs (Promethazine hcl) .Marland Kitchen.. 1 q4h as needed nausea 12)  Benzonatate 200 Mg Caps (Benzonatate) .... One by mouth q 8 hours as needed cough  Patient Instructions: 1)  Acute Bronchitis symptoms for less then 10 days are not  helped by antibiotics. Take over the counter cough medications. Call if no improvement in 5-7 days, sooner if increasing cough, fever, or new symptoms ( shortness of breath, chest pain) .  Prescriptions: BENZONATATE 200 MG CAPS (BENZONATATE) one by mouth q 8 hours as needed cough  #30 x 0   Entered and Authorized by:   Evelena Peat MD   Signed by:   Evelena Peat MD on 10/16/2010  Method used:   Electronically to        [facility]* (retail)       [address]       [place], [place]  [id number]       Ph: [phone]       Fax: [phone]   RxID:   [id number]    Orders Added: 1)  Est. Patient Level III [27253]

## 2010-11-11 NOTE — Letter (Signed)
Summary: [facility]  [facility]   Imported By: Maryln Gottron 11/06/2010 11:30:07  _____________________________________________________________________  External Attachment:    Type:   Image     Comment:   External Document

## 2010-12-14 LAB — POCT URINALYSIS DIPSTICK
Bilirubin Urine: NEGATIVE
Glucose, UA: NEGATIVE mg/dL
Hgb urine dipstick: NEGATIVE
Ketones, ur: NEGATIVE mg/dL
Ketones, ur: NEGATIVE mg/dL
Nitrite: NEGATIVE
Protein, ur: NEGATIVE mg/dL
Specific Gravity, Urine: 1.02 (ref 1.005–1.030)
pH: 7 (ref 5.0–8.0)

## 2010-12-14 LAB — URINE CULTURE: Culture  Setup Time: 201112291125

## 2010-12-19 LAB — BASIC METABOLIC PANEL
BUN: 10 mg/dL (ref 6–23)
BUN: 13 mg/dL (ref 6–23)
CO2: 26 mEq/L (ref 19–32)
Calcium: 8.4 mg/dL (ref 8.4–10.5)
Chloride: 102 mEq/L (ref 96–112)
Chloride: 105 mEq/L (ref 96–112)
Creatinine, Ser: 0.93 mg/dL (ref 0.4–1.5)
Creatinine, Ser: 1.01 mg/dL (ref 0.4–1.5)
Creatinine, Ser: 1.09 mg/dL (ref 0.4–1.5)
GFR calc Af Amer: >60 mL/min (ref >60)
GFR calc non Af Amer: >60 mL/min (ref >60)
Glucose, Bld: 107 mg/dL — ABNORMAL HIGH (ref 70–99)
Glucose, Bld: 108 mg/dL — ABNORMAL HIGH (ref 70–99)
Glucose, Bld: 92 mg/dL (ref 70–99)
Sodium: 139 mEq/L (ref 135–145)

## 2010-12-19 LAB — CBC
HCT: 39.4 % (ref 39.0–52.0)
MCH: 31.5 pg (ref 26.0–34.0)
MCH: 31.5 pg (ref 26.0–34.0)
MCH: 31.7 pg (ref 26.0–34.0)
MCHC: 33.4 g/dL (ref 30.0–36.0)
MCHC: 33.5 g/dL (ref 30.0–36.0)
MCV: 93.8 fL (ref 78.0–100.0)
MCV: 94.4 fL (ref 78.0–100.0)
Platelets: 158 10*3/uL (ref 150–400)
Platelets: 160 10*3/uL (ref 150–400)
Platelets: 168 10*3/uL (ref 150–400)
RBC: 4.2 MIL/uL — ABNORMAL LOW (ref 4.22–5.81)
RDW: 13.5 % (ref 11.5–15.5)
RDW: 14.2 % (ref 11.5–15.5)
WBC: 3.1 10*3/uL — ABNORMAL LOW (ref 4.0–10.5)

## 2010-12-20 LAB — TYPE AND SCREEN: Antibody Screen: NEGATIVE

## 2010-12-20 LAB — DIFFERENTIAL
Basophils Relative: 0 % (ref 0–1)
Lymphs Abs: 0.3 10*3/uL — ABNORMAL LOW (ref 0.7–4.0)
Monocytes Absolute: 0.5 10*3/uL (ref 0.1–1.0)
Monocytes Relative: 8 % (ref 3–12)
Neutro Abs: 5.2 10*3/uL (ref 1.7–7.7)
Neutrophils Relative %: 86 % — ABNORMAL HIGH (ref 43–77)

## 2010-12-20 LAB — URINALYSIS, ROUTINE W REFLEX MICROSCOPIC
Bilirubin Urine: NEGATIVE
Hgb urine dipstick: NEGATIVE
Ketones, ur: NEGATIVE mg/dL
Nitrite: NEGATIVE
Protein, ur: NEGATIVE mg/dL
Specific Gravity, Urine: 1.024 (ref 1.005–1.030)
Urobilinogen, UA: 0.2 mg/dL (ref 0.0–1.0)

## 2010-12-20 LAB — CBC
MCHC: 33.7 g/dL (ref 30.0–36.0)
Platelets: 172 10*3/uL (ref 150–400)
RDW: 13.7 % (ref 11.5–15.5)
WBC: 6 10*3/uL (ref 4.0–10.5)

## 2010-12-20 LAB — COMPREHENSIVE METABOLIC PANEL
ALT: 22 U/L (ref 0–53)
Albumin: 3.3 g/dL — ABNORMAL LOW (ref 3.5–5.2)
Alkaline Phosphatase: 43 U/L (ref 39–117)
Calcium: 8.9 mg/dL (ref 8.4–10.5)
Potassium: 4.4 mEq/L (ref 3.5–5.1)
Sodium: 136 mEq/L (ref 135–145)
Total Protein: 6.4 g/dL (ref 6.0–8.3)

## 2010-12-20 LAB — ABO/RH: ABO/RH(D): A POS

## 2010-12-20 LAB — APTT: aPTT: 33 seconds (ref 24–37)

## 2010-12-20 LAB — PROTIME-INR: INR: 1.02 (ref 0.00–1.49)

## 2010-12-20 LAB — HEMOCCULT GUIAC POC 1CARD (OFFICE): Fecal Occult Bld: NEGATIVE

## 2010-12-20 LAB — URINE CULTURE

## 2010-12-23 ENCOUNTER — Other Ambulatory Visit: Payer: Self-pay | Admitting: Family Medicine

## 2010-12-25 NOTE — Telephone Encounter (Signed)
Call in #[phone] with 5 rf 

## 2011-01-04 LAB — CBC
HCT: 35.5 % — ABNORMAL LOW (ref 39.0–52.0)
HCT: 36.5 % — ABNORMAL LOW (ref 39.0–52.0)
HCT: 37.5 % — ABNORMAL LOW (ref 39.0–52.0)
HCT: 37.6 % — ABNORMAL LOW (ref 39.0–52.0)
HCT: 43.7 % (ref 39.0–52.0)
HCT: 46.9 % (ref 39.0–52.0)
Hemoglobin: 11.9 g/dL — ABNORMAL LOW (ref 13.0–17.0)
Hemoglobin: 12.4 g/dL — ABNORMAL LOW (ref 13.0–17.0)
Hemoglobin: 12.5 g/dL — ABNORMAL LOW (ref 13.0–17.0)
Hemoglobin: 12.6 g/dL — ABNORMAL LOW (ref 13.0–17.0)
Hemoglobin: 14.8 g/dL (ref 13.0–17.0)
Hemoglobin: 15.8 g/dL (ref 13.0–17.0)
MCHC: 33.3 g/dL (ref 30.0–36.0)
MCHC: 33.3 g/dL (ref 30.0–36.0)
MCHC: 33.6 g/dL (ref 30.0–36.0)
MCHC: 33.9 g/dL (ref 30.0–36.0)
MCV: 92.8 fL (ref 78.0–100.0)
MCV: 94.3 fL (ref 78.0–100.0)
Platelets: 125 10*3/uL — ABNORMAL LOW (ref 150–400)
Platelets: 133 10*3/uL — ABNORMAL LOW (ref 150–400)
Platelets: 134 10*3/uL — ABNORMAL LOW (ref 150–400)
Platelets: 163 10*3/uL (ref 150–400)
RBC: 3.99 MIL/uL — ABNORMAL LOW (ref 4.22–5.81)
RBC: 5.06 MIL/uL (ref 4.22–5.81)
RDW: 14 % (ref 11.5–15.5)
RDW: 14.1 % (ref 11.5–15.5)
RDW: 14.3 % (ref 11.5–15.5)
RDW: 14.3 % (ref 11.5–15.5)
RDW: 14.6 % (ref 11.5–15.5)
WBC: 2.9 10*3/uL — ABNORMAL LOW (ref 4.0–10.5)
WBC: 3.6 10*3/uL — ABNORMAL LOW (ref 4.0–10.5)
WBC: 7.9 10*3/uL (ref 4.0–10.5)

## 2011-01-04 LAB — DIFFERENTIAL
Basophils Absolute: 0 K/uL (ref 0.0–0.1)
Basophils Relative: 0 % (ref 0–1)
Eosinophils Absolute: 0 K/uL (ref 0.0–0.7)
Eosinophils Relative: 0 % (ref 0–5)
Eosinophils Relative: 7 % — ABNORMAL HIGH (ref 0–5)
Lymphocytes Relative: 2 % — ABNORMAL LOW (ref 12–46)
Lymphocytes Relative: 24 % (ref 12–46)
Lymphs Abs: 0.2 K/uL — ABNORMAL LOW (ref 0.7–4.0)
Lymphs Abs: 0.9 10*3/uL (ref 0.7–4.0)
Monocytes Absolute: 0.5 10*3/uL (ref 0.1–1.0)
Monocytes Absolute: 0.7 10*3/uL (ref 0.1–1.0)
Monocytes Relative: 9 % (ref 3–12)
Neutro Abs: 2 10*3/uL (ref 1.7–7.7)
Neutro Abs: 7 10*3/uL (ref 1.7–7.7)
Neutrophils Relative %: 88 % — ABNORMAL HIGH (ref 43–77)

## 2011-01-04 LAB — COMPREHENSIVE METABOLIC PANEL
AST: 37 U/L (ref 0–37)
Albumin: 2.5 g/dL — ABNORMAL LOW (ref 3.5–5.2)
Albumin: 3.4 g/dL — ABNORMAL LOW (ref 3.5–5.2)
Alkaline Phosphatase: 38 U/L — ABNORMAL LOW (ref 39–117)
BUN: 13 mg/dL (ref 6–23)
BUN: 44 mg/dL — ABNORMAL HIGH (ref 6–23)
Calcium: 8.4 mg/dL (ref 8.4–10.5)
Chloride: 106 mEq/L (ref 96–112)
Creatinine, Ser: 1.01 mg/dL (ref 0.4–1.5)
GFR calc Af Amer: 39 mL/min — ABNORMAL LOW (ref >60)
Glucose, Bld: 112 mg/dL — ABNORMAL HIGH (ref 70–99)
Potassium: 3.6 mEq/L (ref 3.5–5.1)
Potassium: 4.9 mEq/L (ref 3.5–5.1)
Sodium: 139 mEq/L (ref 135–145)
Total Protein: 4.9 g/dL — ABNORMAL LOW (ref 6.0–8.3)
Total Protein: 6.4 g/dL (ref 6.0–8.3)

## 2011-01-04 LAB — OVA AND PARASITE EXAMINATION: Ova and parasites: NONE SEEN

## 2011-01-04 LAB — FECAL LACTOFERRIN, QUANT: Fecal Lactoferrin: POSITIVE

## 2011-01-04 LAB — HEPATIC FUNCTION PANEL
AST: 27 U/L (ref 0–37)
Bilirubin, Direct: 0.2 mg/dL (ref 0.0–0.3)
Total Bilirubin: 0.8 mg/dL (ref 0.3–1.2)

## 2011-01-04 LAB — BASIC METABOLIC PANEL
BUN: 19 mg/dL (ref 6–23)
CO2: 22 mEq/L (ref 19–32)
Calcium: 8.1 mg/dL — ABNORMAL LOW (ref 8.4–10.5)
Calcium: 8.7 mg/dL (ref 8.4–10.5)
Chloride: 117 mEq/L — ABNORMAL HIGH (ref 96–112)
GFR calc Af Amer: >60 mL/min (ref >60)
GFR calc non Af Amer: 57 mL/min — ABNORMAL LOW (ref >60)
GFR calc non Af Amer: >60 mL/min (ref >60)
GFR calc non Af Amer: >60 mL/min (ref >60)
GFR calc non Af Amer: >60 mL/min (ref >60)
Glucose, Bld: 103 mg/dL — ABNORMAL HIGH (ref 70–99)
Glucose, Bld: 86 mg/dL (ref 70–99)
Potassium: 4 mEq/L (ref 3.5–5.1)
Potassium: 4 mEq/L (ref 3.5–5.1)
Potassium: 4.6 mEq/L (ref 3.5–5.1)
Sodium: 136 mEq/L (ref 135–145)
Sodium: 138 mEq/L (ref 135–145)
Sodium: 138 mEq/L (ref 135–145)
Sodium: 142 mEq/L (ref 135–145)

## 2011-01-04 LAB — CLOSTRIDIUM DIFFICILE EIA
C difficile Toxins A+B, EIA: NEGATIVE
C difficile Toxins A+B, EIA: NEGATIVE
C difficile Toxins A+B, EIA: NEGATIVE
C difficile Toxins A+B, EIA: NEGATIVE
C difficile Toxins A+B, EIA: NEGATIVE

## 2011-01-04 LAB — COMPREHENSIVE METABOLIC PANEL WITH GFR
ALT: 28 U/L (ref 0–53)
CO2: 23 meq/L (ref 19–32)
Calcium: 8.7 mg/dL (ref 8.4–10.5)
Creatinine, Ser: 1.99 mg/dL — ABNORMAL HIGH (ref 0.4–1.5)
GFR calc non Af Amer: 32 mL/min — ABNORMAL LOW (ref >60)
Glucose, Bld: 113 mg/dL — ABNORMAL HIGH (ref 70–99)
Total Bilirubin: 1.3 mg/dL — ABNORMAL HIGH (ref 0.3–1.2)

## 2011-01-04 LAB — CULTURE, BLOOD (ROUTINE X 2)
Culture: NO GROWTH
Culture: NO GROWTH

## 2011-01-04 LAB — RENAL FUNCTION PANEL
BUN: 44 mg/dL — ABNORMAL HIGH (ref 6–23)
CO2: 18 mEq/L — ABNORMAL LOW (ref 19–32)
Calcium: 7.7 mg/dL — ABNORMAL LOW (ref 8.4–10.5)
Chloride: 112 mEq/L (ref 96–112)
Creatinine, Ser: 1.55 mg/dL — ABNORMAL HIGH (ref 0.4–1.5)

## 2011-01-04 LAB — HEMOCCULT GUIAC POC 1CARD (OFFICE): Fecal Occult Bld: POSITIVE

## 2011-01-04 LAB — STOOL CULTURE

## 2011-01-04 LAB — URINALYSIS, ROUTINE W REFLEX MICROSCOPIC
Specific Gravity, Urine: 1.031 — ABNORMAL HIGH (ref 1.005–1.030)
Urobilinogen, UA: 0.2 mg/dL (ref 0.0–1.0)

## 2011-01-04 LAB — URINE CULTURE
Colony Count: NO GROWTH
Special Requests: NEGATIVE

## 2011-01-04 LAB — BLOOD GAS, VENOUS
Acid-base deficit: 2.6 mmol/L — ABNORMAL HIGH (ref 0.0–2.0)
Bicarbonate: 20.8 mEq/L (ref 20.0–24.0)
TCO2: 17.8 mmol/L (ref 0–100)
pCO2, Ven: 35.7 mmHg — ABNORMAL LOW (ref 45.0–50.0)
pH, Ven: 7.389 — ABNORMAL HIGH (ref 7.250–7.300)
pO2, Ven: 58.3 mmHg — ABNORMAL HIGH (ref 30.0–45.0)

## 2011-01-04 LAB — TSH: TSH: 1.777 u[IU]/mL (ref 0.350–4.500)

## 2011-01-04 LAB — T4, FREE: Free T4: 1.05 ng/dL (ref 0.80–1.80)

## 2011-01-04 LAB — LIPASE, BLOOD: Lipase: 22 U/L (ref 11–59)

## 2011-01-04 LAB — PROTIME-INR
INR: 1.13 (ref 0.00–1.49)
Prothrombin Time: 14.4 s (ref 11.6–15.2)

## 2011-01-04 LAB — URINE MICROSCOPIC-ADD ON

## 2011-01-04 LAB — LACTIC ACID, PLASMA: Lactic Acid, Venous: 3.8 mmol/L — ABNORMAL HIGH (ref 0.5–2.2)

## 2011-01-04 LAB — APTT: aPTT: 36 seconds (ref 24–37)

## 2011-02-02 HISTORY — PX: NM MYOVIEW LTD: HXRAD82

## 2011-02-19 NOTE — Discharge Summary (Signed)
[place]  Patient:    [name], [name]                       MRN: [id number] Adm. Date:  [id number] Disc. Date: 01/13/01 Attending:  Darlin Priestly Dictator:   Mancel Bale, P.A. CC:         Juluis Mire, M.D.   Discharge Summary  ADMISSION DIAGNOSES: 1. History of left nephrectomy secondary to cancer. 2. Remote tobacco use. 3. Status post knee replacement in December 1999. 4. Status post Persantine Cardiolite on November 23, 2000, which revealed    evidence of inferior and inferolateral wall thinning with minimal    reversibility in the inferolateral territory with normal ejection fraction. 5. Need for upcoming surgery for both knee replacement and prostate surgery.  DISCHARGE DIAGNOSES: 1. History of left nephrectomy secondary to cancer. 2. Remote tobacco use. 3. Status post knee replacement in December 1999. 4. Status post Persantine Cardiolite on November 23, 2000, which revealed    evidence of inferior and inferolateral wall thinning with minimal    reversibility in the inferolateral territory with normal ejection fraction. 5. Need for upcoming surgery for both knee replacement and prostate surgery. 6. Status post cardiac catheterization on January 11, 2001, by Dr. Lenise Herald with percutaneous transluminal coronary angioplasty stent to the    mid left anterior descending going from 90% to 10% residual and with    percutaneous transluminal coronary angioplasty stent to the proximal left    anterior descending going from 95% to less than 10% residual.  A    catheterization, he was found to have left ventricle showing ejection    fraction 45-50% with mild anterolateral hypokinesis.  There was no renal    artery stenosis with a right solitary kidney.  HISTORY OF PRESENT ILLNESS:  [name] is a 77 year old, white male patient of Dr. Earline Mayotte with a history of left nephrectomy secondary to CA and remote tobacco use.  He was  last seen by Dr. Jenne Campus in December 1999, for preoperative clearance for a knee replacement.  At that time, he underwent a Persantine Cardiolite which revealed no evidence of ischemia.  He subsequently had his knee replaced without complication.  He presented back to Dr. Jenne Campus because he was scheduled to have another knee replacement and prostate surgery and was therefore referred back for a Persantine Cardiolite on November 23, 2000, to rule out ischemia.  He did undergo the Cardiolite on November 23, 2000, which showed evidence of inferior and inferolateral wall thinning with minimal amount of reversibility noted on the inferolateral territory with normal EF.  When he saw Dr. Jenne Campus back in the office to follow up the Cardiolite on December 16, 2000, he denied any chest pain or shortness of breath and had no CHF symptoms and no syncope.  At that time, it was felt that given his Cardiolite findings and his upcoming surgery, they discussed the need for catheterization.  He was agreeable with this approach and was planned for cardiac catheterization.  HOSPITAL COURSE:  On January 11, 2001, [name] underwent cardiac catheterization by Dr. Lenise Herald.  Dr. Jenne Campus performed successful placement of intracoronary stent x 2 in the distal and proximal LAD.  As well, he had mildly depressed systolic function with EF 45-50% with mild anterolateral hypokinesis.  The angiogram had revealed the following: Left main with no significant disease; LAD had 95% proximal stenosis at the take  off of the first diagonal.  There was aneurysmal dilatation after the stenotic lesion.  There was 40% diffuse disease throughout the mid and distal portion of the LAD with a 90% focal lesion after the take off of the third diagonal; The first diagonal with medium size vessel with 70% osteal lesion; second diagonal with no significant disease and third diagonal no significant disease.  Left circumflex was totally  occluded at the AV groove after giving rise to the first OM.  The first OM bifurcated distally and 60% stenotic lesion in the distal portion of the OM.  The distal portion of the AV groove as well as the second obtuse marginal filled via right to left collateral from the distal PLA.  There was no significant disease in the body of the RCA.  PVA with no significant disease.  PLA had 50% stenosis in the proximal portion. Abdominal aortogram showed single, right renal artery with single right kidney.  No evidence of left renal artery.  He tolerated the procedure well without complications.  Dr. Jenne Campus planned to continue aspirin and Plavix and also planned to continue Integrilin for 18 hours.  On January 12, 2001, [name] had no complaints.  His cardiac enzymes were slightly elevated with a CK of 183, MB 18.5 and troponin 3.11.  EKG showed no acute change.  Therefore, it was felt that we should monitor him through the day and continued to check enzymes.  If he remains stable, we would discharge him home the next morning.  On the morning of January 13, 2001, [name] continues to be without chest pain and feels well.  CK is 82, MB 3.1 and troponin 1.1.  His exam is stable, his groin is stable and his lungs are clear.  He is tolerating his beta blocker and ACE inhibitor.  He is afebrile at 99.2, blood pressure is 115/65, pulse 75 and respiratory rate 20.  His CBC and metabolic profile are stable.  He was seen by Dr. Lenise Herald who deems him stable for discharge home.  CONSULTATIONS:  None.  PROCEDURE:  Cardiac catheterization on January 11, 2001, by Dr. Lenise Herald with successful placement of intracoronary stent x 2 in the distal and proximal left anterior descending.  Left ventriculogram showed ejection fraction 45-50%.  He has a solitary right kidney and single right renal artery that showed no evidence of stenosis.  For other catheterization findings, see above under hospital  course.  LABORATORY DATA AND X-RAY FINDINGS:  On January 11, 2001, WBC 5.6, hemoglobin  13.4, hematocrit 38.8, platelets 223.  On January 13, 2001, WBC 4.7, hemoglobin 13.4, hematocrit 39.4, platelets 217.  On January 12, 2001, sodium 134, potassium 3.7, BUN 18, creatinine 1.2, glucose 109.  On January 13, 2001, sodium 137, potassium 4.0, BUN 16, creatinine 1.2, glucose 98.  Cardiac enzymes on January 11, 2001, showed CK 120, MB 10.7, troponin 0.78.  On January 12, 2001, CK 183, MB 18.2, troponin 3.11.  On January 13, 2001, CK 82, MB 3.1, troponin 1.1. EKG throughout the hospitalization showed normal sinus rhythm with no specific change.  DISCHARGE MEDICATIONS: 1. Enteric coated aspirin 325 mg once a day. 2. Plavix 75 mg once a day for one month. 3. Lipitor 10 mg once a day. 4. Toprol XL 25 mg once a day. 5. Altace 1.25 mg one twice a day. 6. Nitroglycerin 0.4 mg sublingual as directed.  ACTIVITY:  No strenuous activity, lifting greater than five pounds, driving or sexual activity x 3  days.  DIET:  Low salt, low fat, low cholesterol diet.  WOUND CARE:  May gently wash groin with warm water and soap.  SPECIAL INSTRUCTIONS:  Call the office at [phone], if any bleeding or any increased size or pain of the groin or with chest discomfort.  FOLLOWUP:  Follow up with Dr. Jenne Campus on April 23, at 10:15 a.m. in the office. DD:  01/13/01 TD:  01/13/01 Job: [id number]

## 2011-02-19 NOTE — H&P (Signed)
[facility]  Patient:    [name], [name] Visit Number: [id number] MRN: [id number]          Service Type: MED Location: 1E 0102 01 Attending Physician:  Tobey Bride Dictated by:   Abelino Derrick, P.A.C. [facility] Admit Date:  09/17/2001   CC:         [name], M.D. [facility]   History and Physical  CHIEF COMPLAINT:  Chest pain.  HISTORY OF PRESENT ILLNESS:  [name] is a 77 year old male followed by [name] and previously seen by [name]. He had a Cardiolite study prior to knee surgery in February of 2002. Cardiolite was abnormal and he has had catheterization done in April 2002. This revealed a 95% proximal LAD lesion that was stented. His EF was 45% to 50% then. It should be noted the patient really has no history of exertional chest pain or angina; he had his Cardiolite for preoperative clearance. He has a history of a remote MI in 1989, catheterization was done at that time but it does not sound like he had angioplasty. The patient came into the [facility] emergency room tonight after developing some epigastric pain and substernal chest pain at home. The patient says he has been having indigestion-type symptoms off and on all day. He attributed this to "gas". He then developed a little chest tightness with this and took a nitroglycerin without relief. He came to the emergency room for further evaluation. His EKG in the emergency room showed sinus rhythm without acute changes and he is currently pain free. He denies any associated nausea, vomiting, or diaphoresis or shortness of breath. He does have some vague aching in his left arm.  PAST MEDICAL HISTORY:  His past medical history is remarkable for renal cell CA, he had a left nephrectomy in the past. His creatinine in April of 2002 was 1.2. He has DJD and had a left total knee replacement in 1999 and needs his right knee done. He denies any history of  hypertension or diabetes. He may have had hyperlipidemia in the past, he was discharged in April on Lipitor but was unable to tolerate this.  CURRENT MEDICATIONS: 1. Toprol XL 25 mg a day. 2. Aspirin q.d. 3. Nexium p.r.n.  ALLERGIES:  No known drug allergies.  SOCIAL HISTORY:  He is married and lives with his wife. He used to smoke but quit several years ago.  FAMILY HISTORY:  His family history is remarkable in that one brother of four has a history of coronary disease. His father died at age 4. His mother died at age 43.  REVIEW OF SYSTEMS:  Remarkable for situational stress at home; the patient apparently is having a stressful situation at home regarding his daughter and some financial problems. He says he is not sleeping well. He has a history of sinus problems in the past, none currently. He denies any history of GI bleeding or peptic ulcer disease or thyroid disease.  PHYSICAL EXAMINATION:  VITAL SIGNS:  Blood pressure 134/76, pulse 72 and regular. O2 saturation is 96% on 2 liters.  GENERAL:  Well-developed, overweight male in no acute distress.  HEENT:  Normocephalic. Extraocular movements intact. Sclerae anicteric. _______ conjunctivae within normal limits.  NECK:  Without bruit and without JVD.  CHEST:  Reveals basilar crackles bilaterally.  CARDIAC:  Regular rate and rhythm with an S4 gallop. No significant murmur.  ABDOMEN:  Obese, nontender. Bowel sounds are present. He does have  a left upper quadrant surgical scar.  EXTREMITIES:  Without edema. Posterior tibial pulses are 3+/4 bilaterally. Femoral pulses are present. There is no femoral bruits.  NEUROLOGIC:  Grossly intact. He is awake, alert, and oriented and cooperative. Moves all extremities without obvious deficit.  SKIN:  Warm and dry.  EKG:  EKG as noted shows normal sinus rhythm without acute changes.  CHEST X-RAY:  Chest x-ray shows bilaterally left lower lobe atelectasis.  LABORATORIES:   Other labs are pending.  IMPRESSION: 1. Unstable angina. 2. History of coronary disease, left anterior descending artery stenting April    of 2002 after abnormal Cardiolite study that was done preoperatively. 3. Degenerative joint disease. 4. Bilateral atelectasis on x-ray; the patient is fairly sedentary at home. 5. History of questionable remote myocardial infarction in 1989.  PLAN:  Patient is admitted to telemetry. We will check followup CKMBs and troponins. He has been started in IV heparin. If his enzymes are negative and followup EKG is normal, he may be discharged for outpatient Cardiolite study. If not, he will probably need re-study. Dictated by:   Abelino Derrick, P.A.C. [facility] Attending Physician:  Tobey Bride DD:  09/17/01 TD:  09/17/01 Job: [id number]

## 2011-02-19 NOTE — Discharge Summary (Signed)
NAMEALOK, MINSHALL NO.:  [id number]   MEDICAL RECORD NO.:  [id number]          PATIENT TYPE:  INP   LOCATION:  6526                         FACILITY:  [facility]   PHYSICIAN:  Darlin Priestly, MD  DATE OF BIRTH:  12/25/1924   DATE OF ADMISSION:  10/14/2006  DATE OF DISCHARGE:  10/18/2006                               DISCHARGE SUMMARY   Mr. Mccorkle is a 77 year old male followed by Dr. Jenne Campus with a history  of coronary disease.  He had LAD stent in April 2002.  He had a  Cardiolite in May 2006 that showed apical thinning and inferior scar  with good LV function.  His previous history of pulmonary embolism and  is currently off Coumadin.  He presented with chest pain consistent with  unstable anginal October 15, 2006.  He was admitted by Dr. Jacinto Halim.  We  started on heparin and nitrates.  His troponins went to 0.27 and CK-MBs  were negative.  CT scan of his chest was negative for pulmonary emboli.  He underwent diagnostic catheterization October 17, 2006.  The  previously placed LAD stent was patent.  He had a 70% proximal diagonal  stenosis and an old subtotaled distal OM stenosis.  The RCA had a 95%  mid distal lesion that was dilated and stented with a Cypher stent.  EF  was 55%.  He tolerated procedure well.  He did have some transient  Wenckebach during sleep.  Dr. Jacinto Halim feels the patient most likely has  sleep apnea and should have a sleep study as an outpatient.  He did not  want to cut back on his beta blocker at this time.  He was discharged  October 18, 2006.   DISCHARGE MEDICATIONS:  1. Toprol XL 50 mg a day.  2. Coated aspirin once a day.  3. Plavix 75 mg a day.  4. Crestor 10 mg a day.  5. Imdur 60 mg a day.  6. CellCept 500 mg t.i.d.  7. Prednisone 15 mg a day.  8. Os-Cal 600 t.i.d.  9. Protonix once a day.  10.Mestinon 60 mg a day.  11.Nitroglycerin sublingual p.r.n.   LABS:  White count 4.8, hemoglobin 13.6, hematocrit 39.3, platelets 170.  Sodium 139, potassium 3.7, BUN 13, creatinine 1.0 and LFTs were normal.  CK-MB were negative.  Troponins peaked at 0.27, D-dimer was 0.64.  TSH  1.18.   CT angiogram of the chest showed no pulmonary embolism. There was  basilar atelectasis. Chest x-ray showed under inflated lungs without  active disease. INR is 1.1.  EKG shows sinus rhythm without acute  changes.   DISCHARGE DIAGNOSIS:  1. Unstable angina status post elective right coronary artery Cypher      stenting this admission.  2. Coronary disease with previous left anterior descending stent      placed in 2002, patent this admission.  3. Normal LV function.  4. History of prior pulmonary embolism, negative CT scan this      admission, the patient is not on Coumadin.  5. History of renal cell CA status post left nephrectomy.  6. Myasthenia gravis, on chronic steroids.  7. Transient second-degree AV block, asymptomatic.  8. Suspected sleep apnea.   PLAN:  The patient was discharged in stable condition and will follow-up  with Dr. Jenne Campus.  Dr. Jacinto Halim feels he should have a sleep study as an  outpatient, although the patient was somewhat reluctant to proceed with  this.      Abelino Derrick, P.A.      Darlin Priestly, MD  Electronically Signed    LKK/[id number]  D:  10/18/2006  T:  10/18/2006  Job:  [id number]   cc:   Jeannett Senior A. Clent Ridges, MD

## 2011-02-19 NOTE — Discharge Summary (Signed)
[place]. [facility]  Patient:    Gerald, Nolan Visit Number: [id number] MRN: [id number]          Service Type: MED Location: [id number] Attending Physician:  Pamella Pert Dictated by:   Marya Fossa, P.A. Admit Date:  09/17/2001 Discharge Date: 09/19/2001   CC:         Delrae Rend, M.D., admitting physician  Dr. Earline Mayotte   Discharge Summary  DATE OF BIRTH:  23-Sep-1925.  ADMISSION DIAGNOSES: 1. Unstable angina, rule out myocardial infarction. 2. Known coronary artery disease. 3. Degenerative joint disease. 4. Gastroesophageal reflux disease. 5. History of renal cell carcinoma with left nephrectomy in the past. 6. Unknown details of hyperlipidemia.  DISCHARGE DIAGNOSES: 1. Unstable angina resolved, myocardial infarction ruled out with negative    enzymes.  Cardiac catheterization revealing stable coronary artery disease.    Medical therapy. 2. Probable gastroesophageal reflux disease exacerbation - Nexium therapy    recommended. 3. Known coronary artery disease. 4. Degenerative joint disease. 5. Gastroesophageal reflux disease. 6. History of renal cell carcinoma with left nephrectomy in the past. 7. Lipid profile revealing low HDL and elevated LDL - statin therapy    initiated.  HISTORY OF PRESENT ILLNESS:  [name] is a 77 year old male followed by Dr. Tiburcio Pea and Lenise Herald, M.D.  He had a Cardiolite in April of 2002 which was abnormal and had a cardiac catheterization revealing 95% proximal LAD lesion that was stented.  EF was 45 to 50% at that time.  Of note, the Cardiolite was done for preoperative clearance for knee surgery - the patient had not been experiencing any chest pain.  He does have a history of remote MI in 1989.  Cath done at that time; however, records unavailable.  The patient came to [facility] Emergency Room tonight after developing some epigastric discomfort with substernal chest  pain at home  He says he has been having indigestion type symptoms on and off all day and attributed this to "gas."  He had developed a little chest tightness, took a nitroglycerin without relief.  He came to the emergency room for further evaluation.  EKG in the ER showed sinus rhythm without acute changes.  He is currently pain-free.  Denies associated nausea, vomiting, diaphoresis or shortness of breath.  He does have some vague aching in the left arm.  The patient will be admitted for possible unstable angina and rule out MI. Will follow cardiac enzymes.  He has been started on IV heparin.  If EKG is normal and enzymes are negative, will consider outpatient Cardiolite versus cardiac catheterization.  PROCEDURES:  Cardiac catheterization September 19, 2001, by Dr. Lenise Herald.  COMPLICATIONS:  None.  CONSULTATIONS:  None.  HOSPITAL COURSE:  [name] was admitted to [facility] on September 17, 2001, for diagnosis of unstable angina.  He was started on IV heparin  per pharmacy protocol.  Cardiac enzymes were ordered.  The patient was continued on home medications including Toprol and aspirin and Nexium.  ADMISSION LABS:  The wbc was 3.9, hemoglobin 14.0, and platelets 233.  INR 1.0.  Sodium 138, potassium 4.5, BUN 24, creatinine 1.3 and glucose 95.  LFTs within normal limits.  Cardiac enzymes were negative x 3. Total cholesterol 187, triglycerides 176, HDL 35, and LDL 117.  Because of the patients persistent episodes of chest discomfort in the hospital despite IV heparin, Delrae Rend, M.D., felt it was prudent to proceed with cardiac  catheterization prior to discharge as opposed to outpatient Cardiolite.  On September 19, 2001, the patient was transferred to [facility] for elective cardiac catheterization to define coronary anatomy.  Cardiac catheterization was performed by Dr. Jenne Campus on September 19, 2001, and revealed normal left main,  widely patent prior proximal LAD stent, 60% mid LAD, 50% diagonal, widely patent circumflex with a large OM branch and a small branch off of this that was subtotally occluded chronically.  RCA with 30% proximal lesion and distal 50% lesion.  Left ventriculogram was not performed. Dr. Jenne Campus found no cardiac etiology for the patients chest pain and recommended continued medical therapy and Nexium daily.  The patient was felt stable for discharged to home post cath.  Groin remained stable and he had no further chest pain.  DISCHARGE MEDICATIONS: 1. Advicor 500/20 q.h.s. (new). 2. Imdur 30 mg a day (new). 3. Diovan 80 mg a day (new). 4. Plavix 75 mg a day (new). 5. Enteric coated aspirin 81 mg a day (reduced). 6. Toprol XL 25 mg q.d. 7. Nexium 40 mg a day for a month then as needed. 8. Nitroglycerin as needed for chest pain.  ACTIVITY:  No strenuous activity, lifting more than five pounds, or driving for two days.  DIET:  Low fat, low salt, low cholesterol diet.  DISCHARGE INSTRUCTIONS:  May shower.  He is asked to call the office if he has any questions or problems.  FOLLOW-UP:  He is to see Dr. Jenne Campus back on Wednesday, October 11, 2001, at 9 oclock in the morning.  He should call Dr. Tiburcio Pea office to be further evaluated for atypical chest pain and possible GI etiology. Dictated by:   Marya Fossa, P.A. Attending Physician:  Pamella Pert DD:  09/19/01 TD:  09/20/01 Job: [id number]

## 2011-02-19 NOTE — Discharge Summary (Signed)
NAME:  [name], [name]                          ACCOUNT NO.:  [id number]   MEDICAL RECORD NO.:  [id number]                   PATIENT TYPE:  INP   LOCATION:  5019                                 FACILITY:  [facility]   PHYSICIAN:  Lubertha Basque. Jerl Santos, M.D.             DATE OF BIRTH:  03/09/1925   DATE OF ADMISSION:  12/03/2003  DATE OF DISCHARGE:  12/10/2003                                 DISCHARGE SUMMARY   ADMISSION DIAGNOSES:  1. Right knee end stage degenerative joint disease.  2. History of coronary artery disease.  3. History of lens implants.  4. History of left total knee replacement.  5. History of kidney excision.  6. History of cardiac catheterization - coronary artery disease.   DISCHARGE DIAGNOSES:  1. Right knee end stage degenerative joint disease.  2. History of coronary artery disease.  3. History of lens implants.  4. History of left total knee replacement.  5. History of kidney excision.  6. History of cardiac catheterization - coronary artery disease.   PROCEDURE:  Right total knee replacement.   HISTORY OF PRESENT ILLNESS:  Briefly, the patient is a 77 year old white  male patient well known to our practice who has had complaints of increasing  right knee pain, having night pain and trouble walking without discomfort.  He has failed oral nonsteroidal anti-inflammatory drugs and injections of  corticosteroids. His recent x-rays standing films revealed end stage right  knee degenerative joint disease.  We discussed treatment options with him,  that being total knee replacement with the risks of anesthesia, risk of  infection, risk of deep venous thrombosis and possible death.   PERTINENT LABORATORY DATA AND X-RAY FINDINGS:  Electrocardiogram shows  normal sinus rhythm.  White blood cell count 6.1, red blood cells 3.40,  hemoglobin  initially at 15.1 with a drop to 10.7 postoperatively.  His  hematocrit initially 44.0 dropped to 31.3.  Serial INR's were done as he  was  on low dose Coumadin protocol and last testing his INR was 2.1.  Sodium 137,  potassium 4.2, chloride 108, glucose 101, BUN 19, creatinine 1.2, calcium  9.4.   HOSPITAL COURSE:  The patient was admitted postoperatively.  He was placed  on the following medications and protocols:  He was given PCA standing  orders for morphine pump, Ancef 1 gram q.8h. X3 doses, laxative of choice,  Coumadin and Lovenox protocols for deep venous thrombosis prophylaxis,  Percocet one to two q.4h. for pain, Skelaxin as a muscle relaxer.  He was  kept on his home medications.  These include Toprol 25 mg one daily and  Imdur 30 mg one a day.  A Foley catheter was used.  Ice to his knee and knee  immobilizer until he was strong and able to bear weight and then  discontinued.  CPM machine from 0 to 50 degrees advance as tolerated for at  least 8 hours a  day, physical therapy was ordered, weight-bearing as  tolerated and then knee-high TED stockings as well as an incentive  spirometer.  CBC drawn two days in a row postoperatively as well as daily  Pro Time levels while on Coumadin.  The first day postoperatively he was  controlled on his PCA pump.  His Foley catheter was functioning, he was  eating food. His vital signs were stable.  His abdomen was soft.  His calf  was nontender and his wound was benign without sign of infection.  He was  continuing to use his CPM machine and also working with physical therapy.  The second day postoperatively his temperature was 98, blood pressure was  126/59, hemoglobin was 12.6.  His drain was pulled.  His dressing was  changed.  There was no sign of infection in his leg and his calf was  nontender.  He had positive bowel sounds and no guarding.  He was eating  well.  His Foley catheter was discontinue.  The next day his PCA pump was  discontinued as well as his IV.  He did spike a temperature on the third day  postoperatively in the evening to 100 but came back to normal  in the  daytime. His wound was benign and at no time during his hospital stay did  his knee incision appear to be infected or show any signs of infection.  His  INR was 1.4, hemoglobin was 10.7 and he was working well with physical  therapy showing progress. His Foley was discontinued.  There was a SACU or  rehabilitation consult that was done but beds were not available and he also  wished to go home.  He did have some tape blistering around the incision  but, once again, at no time did it show any signs of infection.  We did  start him on Keflex 500 mg one p.o. q.i.d. as a precautionary measure.  Arrangements for home health were done and he was discharged home.   CONDITION ON DISCHARGE:  As follows:  He is improved, he is on Coumadin 5 mg  as directed by the pharmacist, Toprol XL 25 mg one a day, Imdur 30 mg one a  day, Tylox one or two q.4-6h. as needed for pain and may resume his aspirin  after his Coumadin is discontinued.  [facility] would be available  for blood draws for Pro Time's and home physical therapy.  He can be weight-  bearing as tolerated.  His diet is unrestricted.  He can change his dressing  as needed throughout the time until we see him back in the office and will  call [phone] for any signs of infection including redness, drainage,  increasing pain and also to set up an appointment.  Prior to his discharge  his discharge instructions were gone over by me and his medications were  told to him in [place] terms so that he could understand them as well as  the signs of infection to be noted if they appeared.  He was not discharged  home on an antibiotic.      Lindwood Qua, P.A.                    Lubertha Basque Jerl Santos, M.D.    MC/MEDQ  D:  01/07/2004  T:  01/08/2004  Job:  [id number]

## 2011-02-19 NOTE — Op Note (Signed)
NAME:  [name], [name]                          ACCOUNT NO.:  [id number]   MEDICAL RECORD NO.:  [id number]                   PATIENT TYPE:  INP   LOCATION:  2899                                 FACILITY:  [facility]   PHYSICIAN:  Lubertha Basque. Jerl Santos, M.D.             DATE OF BIRTH:  [birth year]   DATE OF PROCEDURE:  12/03/2003  DATE OF DISCHARGE:                                 OPERATIVE REPORT   PREOPERATIVE DIAGNOSIS:  Right knee degenerative arthritis.   POSTOPERATIVE DIAGNOSIS:  Right knee degenerative arthritis.   PROCEDURE:  Right total knee replacement.   ANESTHESIA:  General.   SURGEON:  Lubertha Basque. Jerl Santos, M.D.   ASSISTANT:  Bradley Ferris, M.D.  Lindwood Qua, P.A.   INDICATIONS FOR PROCEDURE:  The patient is a 77 year old man about 4-5 years  out from a successful left knee replacement.  He has persisted with right  knee pain despite oral anti-inflammatories and various injectables.  On x-  ray, he has end stage degeneration.  He has pain at rest and pain which  limits his activities and he is offered a knee replacement operation.  Informed operative consent was obtained after a discussion of the possible  complications of reaction to anesthesia, infection, DVT, PE, and death.   DESCRIPTION OF PROCEDURE:  The patient was taken to the operating room suite  where general anesthetic was applied without difficulty.  He was positioned  supine and prepped and draped in normal sterile fashion.  After  administration of preop IV antibiotics, the right leg was elevated,  exsanguinated, and a tourniquet inflated about the thigh.  A longitudinal  anterior incision was made with dissection down to the extensor mechanism.  All appropriate anti-infective measures were used including preoperative IV  antibiotic, Betadine impregnated drape, and closed, hooded exhaust systems  for each member of the surgical team.  A medial parapatellar incision was  made and the knee cap flipped and the  knee flexed.  Residual meniscal  tissues were removed.  He had end stage degeneration medial compartment with  moderate change lateral.  The ACL and PCL were intact.  These two ligaments  were incised.  He had excellent bone quality.  He had some loose bodies and  large osteophytes, all of which were removed.  An extramedullary guide was  placed on the tibia to make a flap cut on this surface parallel to the  floor.  An intramedullary guide was then placed on the femur in order to  create anterior and posterior cuts on this bone creating a flexion gap of 10  mm.  A second intramedullary guide was placed on the femur to create a  distal cut making an extension gap of 10 mm down the knee.  The femur sized  to a large component and the tibia sized to a 5 and appropriate guides were  placed and utilized.  The patella was cut down  from 16 to 15 mm in thickness  and sized to a  41.  The size 41 guide was placed and utilized.  A trial  reduction was done with the aforementioned components and a 10 mm spacer.  The knee cap tracked well and no lateral release was required.  He had a  slight flexion contracture preoperatively and was set up a bit loose in  order to address this problem.  The trial components were removed and all  cut bony surfaces were irrigated with pulsatile lavage.  The cement was  mixed including antibiotic and this was pressurized on all three cut bony  surfaces.  The aforementioned DePuy LCS components were then placed.  The  components, again, a 5 tibia, large femur, 10 mm deep dish spacer, and a 41  all polyethylene patella.  Pressure was held on the cement until it had  hardened and excess was trimmed.  The tourniquet was deflated and a small  amount of bleeding was easily controlled with Bovie cautery.  A drain was  placed exiting superolaterally.  The knee was again irrigated followed by  reapproximation of the extensor mechanism with #1 Vicryl in an interrupted  fashion.   Once this was accomplished, the knee flexed to about 115 degrees.  The subcutaneous tissues were reapproximated in two layers with 0 and 2-0  undyed Vicryl followed by skin closure with staples.  Adaptic was applied to  the wound followed by dry gauze and a loose Ace wrap.  Estimated blood loss  and interoperative fluids as well as accurate tourniquet time, which was  about one hour, can be obtained from anesthesia records.   DISPOSITION:  The patient was extubated in the operating room and taken to  the recovery room in stable condition.  The plans were for him to be  admitted to the orthopedic surgery service with appropriate postop care to  include perioperative antibiotics and Coumadin plus Lovenox for DVT  prophylaxis.                                               Lubertha Basque Jerl Santos, M.D.    PGD/MEDQ  D:  12/03/2003  T:  12/03/2003  Job:  [id number]

## 2011-02-19 NOTE — Discharge Summary (Signed)
[name], FRANEK NO.:  [id number]   MEDICAL RECORD NO.:  [id number]          PATIENT TYPE:  INP   LOCATION:  3014                         FACILITY:  [facility]   PHYSICIAN:  Rene Paci, M.D. LHCDATE OF BIRTH:  01-09-1925   DATE OF ADMISSION:  09/01/2005  DATE OF DISCHARGE:  09/07/2005                                 DISCHARGE SUMMARY   DISCHARGE DIAGNOSES:  1.  Acute infectious gastroenteritis.  2.  Tachycardia.  3.  Abdominal pain secondary to #1.  4.  Chronic obstructive pulmonary disease.   HISTORY OF PRESENT ILLNESS:  The patient is a 77 year old white male with a  history of myasthenia gravis who presented to [facility] Emergency  Department on September 01, 2005, with complaints of nausea, vomiting, and  diarrhea since 4 a.m. on the day of admission.  He then developed left-sided  chest pain and shoulder pain which later localized to the left abdomen,  accompanied by vomiting, diarrhea, and cramps.  The patient was admitted for  further evaluation and treatment.   PAST MEDICAL HISTORY:  1.  Myasthenia gravis followed by Dr. Anne Hahn.  2.  Coronary artery disease status post PTCA with stent in 2002.  3.  Osteoporosis with compression fracture November 2005 and December 2005.  4.  Degenerative joint disease with a right total knee replacement March      2005 and a left total knee replacement in 1997.  5.  History of PE December 2005.  6.  COPD.  7.  History of renal cell carcinoma status post left nephrectomy.  8.  Status post wrist fracture with plate.   HOSPITAL COURSE:  #1.  ACUTE INFECTIOUS GASTROENTERITIS:  The patient was admitted and was  placed on IV fluids.  A KUB was performed to rule out small-bowel  obstruction which was negative.  In addition, a chest x-ray was performed  which was also negative with the exception of cardiomegaly.  Stool studies  were sent which were negative including negative for C. difficile x2. The  patient was placed on a liquid diet which was later advanced.  Of note, the  patient's wife had similar symptoms which started several days after his  hospitalization.  He did have fever during this admission which resolved.  He is currently tolerating a regular diet without any diarrhea, nausea or  vomiting.   #2.  TACHYCARDIA:  Tachycardia was likely secondary to dehydration in the  setting of acute gastroenteritis.  Tachycardia resolved with IV hydration.   #3.  ABDOMINAL PAIN AND CHEST PAIN:  Cardiac enzymes were performed to rule  out any acute cardiac issues.  Cardiac enzymes were negative.  The patient  was noted to have some mild hypoxia.  A chest x-ray was performed which was  clear on November 29.  The patient was placed on Spiriva, and a prescription  will be provided at discharge.   #4.  HISTORY OF PULMONARY EMBOLUS:  The patient is maintained at home on  Coumadin 7.5 mg once daily.  During this admission, he was noted to be  supratherapeutic.  On  the date of discharge, the patient's INR is 3.3.  At  this time, we will decrease patient's Coumadin to 5 mg p.o. daily; however,  he will need close outpatient followup of PT/INR and adjustment accordingly.   DISCHARGE MEDICATIONS:  1.  Spiriva 1 inhalation daily.  Prescription provided for 30-day supply.  2.  Mestinon 60 mg p.o. 4 times daily.  3.  Bentyl (dicyclomine) 10 mg 4 times daily as needed.  4.  Toprol 50 mg once daily.  5.  Cellcept 500 mg 4 times daily.  6.  Pepcid 20 mg daily.  7.  Imdur 30 mg p.o. once daily.  8.  MiraLax 17 g as needed.  9.  Coumadin 5 mg 1 tablet p.o. daily.  Prescription provided for 30      tablets.  10. Ultram 50 mg as needed.  11. Fosamax 70 mg every Sunday.  12. Combivent 2 puffs 2 to 4 times daily as needed.  13. Vicodin 1 to 2 tablets every 4 hours as needed.  14. Os-Cal Plus D 1 tablet p.o. 3 times daily.  15. Aspirin 81 mg once daily.  16. Prednisone 20 mg every other day as  before.   LABORATORY DATA AT DISCHARGE:  INR 3.3.  Hemoglobin 12.9, hematocrit 37.9.  BUN 8, creatinine 0.8.   FOLLOW UP:  The patient will follow up with Dr. Gershon Crane on Friday,  December 8, at 10:45 a.m.  At this time, he will need a followup PT/INR.  He  will be discharged on 5 mg Coumadin p.o. daily which may need to be adjusted  as an outpatient.      Melissa S. Peggyann Juba, NP      Rene Paci, M.D. [facility]  Electronically Signed    MSO/MEDQ  D:  09/07/2005  T:  09/07/2005  Job:  [id number]   cc:   Jeannett Senior A. Clent Ridges, M.D. [facility]  [address] [place]  [place] [address]

## 2011-02-19 NOTE — Op Note (Signed)
NAMEHARVIR, [name]                ACCOUNT NO.:  [id number]   MEDICAL RECORD NO.:  [id number]          PATIENT TYPE:  AMB   LOCATION:  SDS                          FACILITY:  [facility]   PHYSICIAN:  Lubertha Basque. Dalldorf, M.D.DATE OF BIRTH:  [birth year]   DATE OF PROCEDURE:  06/10/2005  DATE OF DISCHARGE:  06/10/2005                                 OPERATIVE REPORT   PREOPERATIVE DIAGNOSIS:  Right distal radius fracture.   POSTOPERATIVE DIAGNOSIS:  Right distal radius fracture.   PROCEDURE:  ORIF right distal radius fracture.   ANESTHESIA:  Axillary block and MAC.   ATTENDING SURGEON:  Lubertha Basque. Jerl Santos, M.D.   ASSISTANTHarolyn Rutherford, PA   INDICATIONS FOR PROCEDURE:  The patient is a 77 year old man who fell about  a week ago and sustained a displaced intra-articular fracture of his right  dominant wrist. He is offered ORIF in hopes of realigning the bones and  giving him a functional wrist. Informed operative consent was obtained after  discussion of possible complications of reaction to anesthesia, infection,  neurovascular injury, and arthritis.   DESCRIPTION OF PROCEDURE:  The patient was taken to the operating suite  where axillary block was applied along with some sedation.  He was  positioned supine and prepped and draped in normal sterile fashion. After  administration of preop IV Kefzol, the right arm was elevated,  exsanguinated, tourniquet inflated about the upper arm. He was placed about  10 pounds the finger trap traction.  A volar incision was made with  dissection down to the FCR tendon. This was taken in a radial direction  protecting the neurovascular bundle.  I went through the floor of this  tunnel to expose the distal radius on its volar aspect. I elevated the short  muscles off the volar aspect and ulnar direction. We then reduced the  fracture under direct visualization and under fluoroscopy. I stabilized the  construct with a [employer] DVR plate. We used  approximately seven  distal screws and three proximal screws. Used fluoroscopy to confirm  adequate placement of hardware and reduction of fracture. I  read all these  views myself. The traction was then removed. The tourniquet was deflated and  a small amount bleeding was easily controlled Bovie cautery. Radial pulse  remained intact as did an ulnar pulse. The wound was irrigated followed by  reapproximation of subcutaneous tissues with 2-0 undyed Vicryl and skin  closure with nylon. Some Marcaine was injected about the incision site.  Adaptic was applied followed by dry gauze and a volar splint plaster with  wrist in slight extension. Estimated blood loss and intraoperative fluids as  well as accurate tourniquet time can be obtained from  anesthesia records.   DISPOSITION:  The patient was taken to recovery in stable condition. Plans  were for him to go home the  same day and follow up in the office in less  than a week.  I will contact him by phone tonight.      Lubertha Basque Jerl Santos, M.D.  Electronically Signed     PGD/MEDQ  D:  06/10/2005  T:  06/11/2005  Job:  [id number]

## 2011-02-19 NOTE — Discharge Summary (Signed)
[name], [name]                ACCOUNT NO.:  [id number]   MEDICAL RECORD NO.:  [id number]          PATIENT TYPE:  INP   LOCATION:  0356                         FACILITY:  [facility]   PHYSICIAN:  [name], M.D. LHCDATE OF BIRTH:  09/20/1925   DATE OF ADMISSION:  11/17/2004  DATE OF DISCHARGE:                                 DISCHARGE SUMMARY   DISCHARGE DIAGNOSES:  1.  Fever, suspect viral pneumonia, chest x-ray improved. Continue empiric      antibiotics.  2.  Shortness of breath, acute-on-chronic, with chronic obstructive      pulmonary disease exacerbation secondary to above, resolved.  3.  History of pulmonary embolus September 2005; subtherapeutic INR on      admission. CT chest negative for pulmonary embolus. Discharge INR is      2.7.  4.  History of myasthenia gravis; treatment ongoing by Dr. Anne Nolan.  5.  History of coronary artery disease status post percutaneous transluminal      coronary angioplasty and stent in 2002; continue medical management.  6.  Osteoporosis with multiple compression fractures November 2005 and      December 2005.  7.  History of renal cell carcinoma status post left nephrectomy.  8.  Degenerative joint disease status post right total knee arthroplasty      March 2005 and left total knee replacement in 1997.  9.  General debilitation status post physical therapy/occupational therapy      evaluation; stable for home health therapy as prior to admission.   DISCHARGE MEDICATIONS:  1.  Prednisone taper.  2.  Empiric Avelox 400 mg daily x7 days more to complete 10-day course.   Other medications are as prior to admission and include:  1.  Toprol-XL 25 mg p.o. daily.  2.  Mestinon 60 mg q.i.d.  3.  Aspirin 81 mg daily.  4.  Cellcept 250 mg tablets two b.i.d.  5.  Pepcid 20 mg daily or Protonix 40 mg daily.  6.  Imdur 30 mg p.o. daily.  7.  MiraLax 17 g p.o. daily.  8.  Coumadin 5 mg daily except 7 mg on Monday and Friday. Outpatient  follow-      up with the primary M.D.  9.  Ultram p.r.n. pain.  10. Combivent inhaler two puffs three to four times a day as needed.  11. Home oxygen 2 L at all times with exertion as prior to admission.  12. Fosamax 70 mg once weekly on Saturday.  13. Calcium + D supplements b.i.d.   DISPOSITION:  The patient is discharged home in medically stable and  improved condition. He has been afebrile since admission. He is tolerating  room air saturations at 92-94%, improving even higher with 2 L as  instructed, though the patient is often noncompliant with this. He is self-  ambulatory with aid of walker. Hospital follow-up will be with primary care  physician, Dr. Clent Nolan, on Wednesday, November 25, 2004 at 1 p.m. for hospital  follow-up, Coumadin check, as well as to reschedule bone density test that  the patient missed during this hospitalization. Otherwise, hospital  follow-  up with neurologist, Dr. Anne Nolan, to be scheduled by the patient on an as-  needed basis or as previously scheduled for routine follow-up.   HOSPITAL COURSE BY PROBLEM:  FEVER WITH SHORTNESS OF BREATH. The patient is  a 77 year old gentleman with myasthenia gravis and COPD, often noncompliant  with his oxygen, who presented to the emergency room with his wife on the  day of admission complaining of being unable to catch is breath. He was  found to have a temperature of 102.3 as well as bilateral infiltrates on  chest x-ray and was admitted for antibiotic treatment, IV steroids, and  further observation and treatment. A CT chest was repeated as his Coumadin  was subtherapeutic at 1.7 and he has history of PE. However, the CT failed  to reveal any recurrent clot or significant airspace disease. Follow-up  chest x-ray in 48 hours of admission showed significant clearing with  previous bibasilar infiltrates and the patient's O2 saturations improved  accordingly. He was changed to p.o. Avelox for continued empiric treatment   of probable viral disease and his fever had been resolved since admission.  Prednisone was changed back to an oral taper and it is unclear to me at this  time if the patient had been on prednisone prior to admission as he is  claiming that he should not be on so much prednisone due to his  osteoporosis. Defer further management of this to his primary M.D. and/or  neurologist as this is unclear to me to be related treatment to myasthenia  or chronic COPD. The patient is instructed to continue his Combivent and  oxygen as prior to admission with stressed need for compliance on this. All  other medications for his other medical problems as listed above are without  change during this time.      VL/MEDQ  D:  11/20/2004  T:  11/20/2004  Job:  [id number]

## 2011-02-19 NOTE — Consult Note (Signed)
NAMEARIN, PERAL NO.:  [id number]   MEDICAL RECORD NO.:  [id number]          PATIENT TYPE:  INP   LOCATION:  3307                         FACILITY:  [facility]   PHYSICIAN:  Ollen Gross. Vernell Morgans, M.D. DATE OF BIRTH:  1925/03/15   DATE OF CONSULTATION:  06/26/2004  DATE OF DISCHARGE:                                   CONSULTATION   HISTORY OF PRESENT ILLNESS:  Mr. Cadieux is a 77 year old white male who was  initially admitted several days ago with shortness of breath and pain all  over his body. He was found to have pulmonary embolus and as part of his  workup, also underwent an abdominal CT, which showed a possible stone in his  gallbladder but no inflammatory change of the gallbladder. There was some  question of some small inflammatory change around his sigmoid colon. He does  not note any particular pain in any particular place at the moment. He  denies any nausea or vomiting. He feels much better and denies any abdominal  pain. He has not run any fevers and his white count has been normal.   REVIEW OF SYSTEMS:  His other review of systems is unremarkable.   PAST MEDICAL HISTORY:  Significant for myasthenia gravis, coronary artery  disease, renal cell carcinoma and degenerative joint disease.   PAST SURGICAL HISTORY:  Significant for bilateral total knee replacements,  the most recent was March of 2005 and percutaneous transluminal coronary  angioplasty and stent placement of his coronary artery and left nephrectomy.   MEDICATIONS:  Include pyridostigmine, prednisone, aspirin, Colace,  magnesium, Isosorbide, iron, Toprol, dicyclomine and CellCept.   ALLERGIES:  No known drug allergies.   SOCIAL HISTORY:  He quit smoking 25 to 30 years ago. Denies any alcohol use.  His family history is significant for coronary artery disease in his  brothers.   PHYSICAL EXAMINATION:  VITAL SIGNS:  Temperature 98.4, blood pressure  120/55, pulse 85.  GENERAL:  A well  developed, well nourished, white male in no acute distress.  SKIN:  Warm and dry with no jaundice. Eyes, extraocular muscles intact.  Pupils are equal, round and reactive to light. Sclerae nonicteric.  LUNGS:  Clear bilaterally. __________.  HEART:  Regular rate and rhythm with an impulse in left chest.  ABDOMEN:  Soft and nontender with no palpable mass or hepatosplenomegaly.  EXTREMITIES:  No clubbing, cyanosis, or edema with good strength of his arms  and legs.  PSYCHOLOGICAL:  He is alert and oriented x3 with no evidence today of  anxiety or depression.   LABORATORY DATA:  On review of his lab work, he had a white count of 5,400  and his liver functions were normal.   His CT scan report was reviewed and showed possible stone in his gallbladder  but no gallbladder wall thickening or ductal dilatation. There was also some  question of some mild inflammatory change near his sigmoid colon.   ASSESSMENT/PLAN:  This is a 77 year old gentleman admitted with acute  pulmonary embolus who is now on anticoagulation. It appears as though he may  have a small stone in his gallbladder but there was no inflammatory change  of the gallbladder and given his current condition, I would not recommend  surgery at this time to remove a gallbladder that may be asymptomatic. He  has no abdominal pain, fever, or white count to go along with sigmoid  diverticulitis either, although his lack of findings may be related to his  prednisone and immunosuppression. Because of this, I would probably keep him  on antibiotics for several days and monitor him but if he continues to have  no abdominal pain and do well, then he will probably not require any  surgery.   We will continue to follow along with you and appreciate the opportunity to  help with the care of this patient.       PST/MEDQ  D:  06/26/2004  T:  06/27/2004  Job:  [id number]

## 2011-02-19 NOTE — Discharge Summary (Signed)
NAMEHANLEY, WOERNER                ACCOUNT NO.:  [id number]   MEDICAL RECORD NO.:  [id number]            PATIENT TYPE:   LOCATION:                                 FACILITY:   PHYSICIAN:  Valerie A. Felicity Coyer, MD     DATE OF BIRTH:   DATE OF ADMISSION:  11/09/2006  DATE OF DISCHARGE:  11/10/2006                               DISCHARGE SUMMARY   DISCHARGE DIAGNOSIS:  Probable bronchitis.   HISTORY OF PRESENT ILLNESS:  [name] is a 77 year old white  male who presented to the emergency department with complaint of two day  history of shortness of breath which was accompanied by chest pressure  and arm pain. The patient also was noted to have a productive cough with  tactile fevers and chills. He was admitted for further evaluation and  treatment.   PAST MEDICAL HISTORY:  1. Coronary artery disease status post PCI to the LAD in 2000 with      repeat cardiac cath for chest pain January 2008 and Cypher stent      placement to a 95% RCA lesion on October 17, 2006.  2. Remote history of PE.  3. Myasthenia gravis.  4. Osteoporosis.  5. GERD.  6. History of renal cell carcinoma.  7. Osteoarthritis.  8. Bilateral knee arthroplasty.   COURSE OF HOSPITALIZATION:  Probable bronchitis. The patient was  admitted and underwent a chest x-ray which showed low lung volumes and  mild pulmonary vascular congestion. There was no clear sign of  pneumonia.  He was started on Avelox. He has remained afebrile and is  improved clinically, although he did complain of some chest discomfort.  He was also noted to have wheezing at the time, and his symptoms  appeared to be more pulmonary in origin. He did undergo serial cardiac  enzymes which were negative x3. He was maintained on his aspirin and  Plavix as prior to admission. In addition a CT angio chest was performed  as the patient has a remote history of PE. CT angio chest was negative  for PE. It did note multiple compression fractures three  of which had  been previously treated. At this time we plan to discharge the patient  to home as he is clinically improved. He is to follow up with Dr. Clent Ridges  next week and will be treated with a 7-day course of p.o. Avelox.   MEDICATIONS AT TIME OF DISCHARGE:  1. Plavix 75 mg p.o. daily.  2. Zocor 40 mg p.o. daily.  3. Protonix 40 mg p.o. daily.  4. CellCept 500 mg p.o. t.i.d.  5. Aspirin 325 mg p.o. daily.  6. Toprol XL 50 mg p.o. daily.  7. Mestinon 60 mg p.o. with meals and at bedtime.  8. Prednisone 15 mg p.o. every other day.  9. Avelox 400 mg p.o. daily for seven days.   DISPOSITION:  The patient will be discharged to home.   PERTINENT LABORATORY DATA:  At time of discharge cardiac enzymes  negative x3. Blood cultures drawn on November 08, 2006 remain no growth  to date.  Urine culture is pending. BUN 23, creatinine 1.12. Hemoglobin  13.9, hematocrit 40.8.   FOLLOW UP:  The patient to follow up with Dr. Gershon Crane on February 13  at 1:30 p.m. At this visit he will need follow up of final culture  results.      Sandford Craze, NP      Raenette Rover. Felicity Coyer, MD  Electronically Signed    MO/MEDQ  D:  11/10/2006  T:  11/10/2006  Job:  [id number]   cc:   Tera Mater. Clent Ridges, MD

## 2011-02-19 NOTE — Cardiovascular Report (Signed)
[place]  Patient:    [name], [name]                       MRN: [id number] Proc. Date: 01/11/01 Adm. Date:  [id number] Attending:  Rosaria Ferries F CC:         Juluis Mire, M.D.                        Cardiac Catheterization  PROCEDURES: 1. Left heart catheterization. 2. Coronary angiography. 3. Left ventriculogram. 4. Abdominal aortogram. 5. Left anterior descending - proximal and mid - placement of    intracoronary stent x 2.  COMPLICATIONS:  None.  INDICATIONS:  Mr. Dempster is a 77 year old, white male, patient of Dr. Ellene Route with a history of left nephrectomy secondary to CA, remote tobacco abuse, recently underwent Persantine Cardiolite on November 23, 2000, revealing inferolateral wall thinning with reversibility noted and inferolateral wall territory.  He had a normal EF.  He is now referred for cardiac catheterization to define his coronary anatomy.  DESCRIPTION OF PROCEDURE:  After given informed written consent, the patient was brought to the cardiac catheterization lab where his right and left groins were shaved, prepped, and draped in the usual sterile fashion.  ECG monitoring was established.  Using modified Seldinger technique, a #6 French arterial sheath was inserted in the right femoral artery.  A 6 French diagnostic catheter was then used to perform diagnostic angiography.  This reveals a large left main with no significant disease.  The LAD is a large vessel which coursed to the apex and gave rise to three diagonal branches.  The LAD is noted to have a 95% proximal stenosis at the takeoff of the first diagonal.  There is aneurysmal dilatation after the stenotic lesion.  There is 40% diffuse disease throughout the mid and distal portion of the LAD with a 90% focal lesion after the takeoff of the third diagonal.  The first diagonal was a medium sized vessel with 70% ostial lesion.  The second diagonal was a small  vessel with no significant disease. The third diagonal was a medium sized vessel with no significant disease.  The left circumflex is a medium sized vessel which coursed in the AV groove and gave rise to one obtuse marginal branch.  AV groove circumflex is noted to be totally occluded after giving rise to the first OM.  The first OM is a medium sized vessel which bifurcates distally and has a 60% stenotic lesion in the distal portion of the OM.  The distal portion of the AV groove as well as a second obtuse marginal fills via right to left collaterals from the distal PLA.  There does not appear to be any significant disease in the distal AV groove circumflex or second OM.  The right coronary artery is a large vessel which is dominant and gives rise to PDA and large posterolateral branch.  There is no significant disease in the body of the RCA.  The PDA is a medium sized vessel with no significant disease.  The PLA is a large vessel which bifurcates in its mid segment and gives collaterals to the distal AV groove circumflex.  There is 50% stenosis in the proximal portion of the PLA.  LEFT VENTRICULOGRAM:  The left ventriculogram reveals mildly depressed EF at 45-50%.  There is mild anterolateral hypokinesis.  ABDOMINAL AORTOGRAM:  Abdominal aortogram reveals single right renal artery with  single right kidney.  There is no evidence of left renal artery and clips are noted in the mid abdomen.  HEMODYNAMICS:  Systemic arterial pressure 124/66, LV systemic pressure 123/6, LVEDP of 16.  INTERVENTIONAL PROCEDURE:  LAD - proximal and mid:  After diagnostic angiography, the #6 French sheath was then exchanged for a #7 [place] arterial sheath and a #7 [place] guiding catheter was coaxially engaged in the left coronary ostium.  Next, a short 0.014 Forte guidewire was advanced out of the guiding catheter into the proximal LAD.  This guidewire was then positioned in the distal LAD without  difficulty.  Next, a Guidant 2.5 x 8 mm Pixel stent was then tracked over the guidewire, positioned in the distal LAD stenotic lesion. The stent was then deployed to a maximum of 16 atmospheres for a total of one minute.  Followup angiogram revealed no evidence of dissection or thrombus with TIMI-3 flow to the distal vessel.  This stent balloon was then removed and a second Guidant, Penta 4.0 x 23 mm stent was then tracked across the proximal LAD stenotic lesion.  This stent was then deployed to a maximum of 16 atmospheres for a total of 18 seconds.  The patient became markedly ischemic and the balloon was then deflated and a second inflation to 16 atmospheres performed for a total of 18 seconds.  Followup angiogram revealed no evidence of dissection or thrombus with TIMI-3 flow to the distal vessel.  There did appear to be some impingement on the ostium of the first diagonal.  However, we were unable to recanulate the diagonal using the guidewire.  The patient was chest pain free.  We elected to proceed with Integrilin infusion. Intravenous doses of heparin were given to maintain the ACT between 200 and 300.  Final orthogonal angiograms reveal less than 10% residual stenosis in the proximal and mid LAD stenotic lesions with TIMI-3 flow to the distal vessel. At this point, we elected to conclude the procedure.  All balloons, wires, and catheters removed.  Hemostatic sheaths were sewn in place.  The patient was transferred back to the ward in stable condition.  CONCLUSIONS: 1. Successful placement of intracoronary stent x 2 (Guidant 2.5 x 8 mm    Pixel and 4.0 x 23 mm Penta) in the distal and proximal left anterior    descending stenotic lesions respectively. 2. Mildly depressed left ventricular systolic function with wall motion    abnormalities as noted above. 3. Adjunct use of Integrilin fusion. 4. Single right renal artery and right kidney.  There is no evidence of a    left renal  artery or left kidney. DD:  01/11/01 TD:  01/11/01 Job: [id number]

## 2011-02-19 NOTE — H&P (Signed)
NAMEHERBERTH, DEHARO NO.:  [id number]   MEDICAL RECORD NO.:  [id number]          PATIENT TYPE:  EMS   LOCATION:  ED                           FACILITY:  [facility]   PHYSICIAN:  Deirdre Peer. Polite, M.D. DATE OF BIRTH:  1925-03-01   DATE OF ADMISSION:  11/17/2004  DATE OF DISCHARGE:                                HISTORY & PHYSICAL   PRIMARY CARE PHYSICIAN:  Dr. Gershon Crane.   CONSULTANTS:  Dr. Jerl Santos, orthopedic surgery.   CHIEF COMPLAINT:  Shortness of breath.   The patient is a 77 year old white male with past medical history of  extensive osteoporosis and multiple compression fractures, COPD, and  myasthenia gravis, who presents on November 17, 2004, after having several  days of increased dyspnea on exertion.  He was brought in by his wife, when  it was noted today that he was acting very confused.  The patient has a  history of COPD and actually was just recently in the hospital treated for  multiple compression fractures requiring kyphoplasty and vertebroplasty.  This was on September 23, 2004.  Since then, he has been doing relatively  well, but he has been having more and more shortness of breath.  When he was  brought into the emergency room, he was noted to have an O2 saturation of  89% on 3 L and a temp of 102.3.  The patient's oxygen was increased up to 5  L.  Labs were checked.  He was noted to have a pO2 of 81, but this was on 4  L of oxygen.  In addition, he had a normal white count of 4.7 but an 80%  shift on his differential.  A chest x-ray was checked which noted bilateral  infiltrates, although given he had a recent diagnosis of pulmonary embolus  back in September, so a CT of his chest was done.  There was no evidence of  any acute pulmonary embolus but showed bibasilar atelectasis and infiltrate.  The patient was seen.  He complained of shortness of breath as well as he  complained of some right leg and knee pain.  His wife tells me that he  has  had some severe knee swelling since his knee replacement and is followed by  Dr. Jerl Santos.  He was recently seen in Dr. Nolon Nations office, and the plan  was to have an aspiration of his knee to remove fluid, which she was told it  was blood.  The patient otherwise denies any other complaints.  He is  slightly lethargic but easily arousable.  He appears to be alert and  oriented x 2.  He otherwise denies any complaints such as headache, visual  changes, dysphagia, chest pain, palpitations.  He does complain of wheezing  and a productive cough with whitish sputum.  He denies any abdominal pain,  hematuria, dysuria, constipation, or diarrhea.  He denies any left leg pain.   REVIEW OF SYSTEMS:  Otherwise negative.   PAST MEDICAL HISTORY:  1.  Osteoporosis.  2.  Multiple compression fractures, status post vertebroplasty and  kyphoplasty.  3.  Status post bilateral knee replacements.  4.  CAD with a history of MI in the past.  5.  History of hypertension.  6.  History of myasthenia gravis.  7.  History of COPD.   MEDICATIONS:  The patient is on, based on his last discharge summary:  1.  Toprol XL 25 daily.  2.  Mestinon 60 q.i.d.  3.  Aspirin 81 daily.  4.  CellCept 250, 2 p.o. b.i.d.  5.  Pepcid 20 daily.  6.  Imdur 30 daily.  7.  Miralax 17 g daily.  8.  Coumadin 5 q.h.s.  9.  Ultram 50 q.i.d.  10. Vicodin 1-2 q.4-6h. p.r.n. pain.  11. Fosamax 70 on Sundays.  12. Combivent 2 puffs t.i.d. to q.i.d. p.r.n.  13. Os-Cal D 500 mg p.o. t.i.d.   ALLERGIES:  The patient has no known drug allergies.   SOCIAL HISTORY:  The patient does not use any tobacco or alcohol.  He lives  with his wife.   FAMILY HISTORY:  Noncontributory.   PHYSICAL EXAMINATION:  VITAL SIGNS:  On admission, temp 102.3, heart rate  153 now down to 102.  Blood pressure 143/83, respirations 26, O2 saturations  89% on 3 L, now 92% on 5 L.  GENERAL:  The patient appears to be arousable, oriented x 2, no  apparent  distress.  HEENT:  Normocephalic, atraumatic.  Mucous membranes are dry.  He has no  carotid bruits.  HEART:  Regular rate and rhythm, S1 and S2.  LUNGS:  He has decreased breath sounds throughout with some mild rales at  the bases.  ABDOMEN:  Soft, nontender, obese, positive bowel sounds.  EXTREMITIES:  No clubbing, cyanosis.  He has a 1-2+ pitting edema with  marked swelling of his right knee.   LABORATORY DATA:  A pH of 7.46, pCO2 28, pO2 81, bicarb 20, O2 saturation  93%.  This is on 4 L.  White count of 4.7 with an 80% shift.  H&H 13.0 and  39.  MCV of 90, platelet count 316.  Sodium 132, potassium 3.7, chloride  106, bicarb 21, BUN 15, creatinine 0.9, glucose 111.  LFTs are all within  normal limits except for an albumin of 3.3.  D-dimer is elevated at 8.81.  PT 18.5, INR 1.8.   ASSESSMENT/PLAN:  1.  Shortness of breath, likely chronic obstructive pulmonary disease      exacerbation from pneumonia.  IV steroids, nebulizers, oxygen,      antibiotics.  No evidence of pulmonary embolus by CT.  The patient is on      Coumadin from his pulmonary embolus diagnosed in September 2005.  Given      his decreased ambulation, would continue his Coumadin, although we will      add Lovenox until his INR is greater than 2.  If the patient does not      respond to treatments, would consider myasthenia gravis exacerbation,      would at that time ask neurology for assistance.  2.  Decreased ambulation with reported hemarthroses of the right knee joint.      The patient is being followed by Dr. Jerl Santos.  We will ask them for a      consult to see if an aspiration of the knee needs to be done while in      the hospital.  3.  Myasthenia gravis.  Continue Mestinon.  4.  Osteoporosis with multiple compression fractures, continue Os-Cal.  5.  Hypertension, coronary artery disease.  Continue medications.     SKK/MEDQ  D:  11/17/2004  T:  11/17/2004  Job:  [id number]   cc:   Jeannett Senior A.  Clent Ridges, M.D. [facility]   Lubertha Basque. Jerl Santos, M.D.  [address]  [place]  [place] [address]  Fax: [phone]

## 2011-02-19 NOTE — H&P (Signed)
NAME:  [name], [name] NO.:  [id number]   MEDICAL RECORD NO.:  [id number]          PATIENT TYPE:  EMS   LOCATION:  MAJO                         FACILITY:  [facility]   PHYSICIAN:  Isidor Holts, M.D.  DATE OF BIRTH:  June 02, 1925   DATE OF ADMISSION:  06/23/2004  DATE OF DISCHARGE:                                HISTORY & PHYSICAL   PRIMARY CARE PHYSICIAN:  Unassigned.   CHIEF COMPLAINT:  Shortness of breath, chest pain, abdominal pain.   HISTORY OF PRESENT ILLNESS:  As above.  This is a 77 year old Caucasian male  with known case of coronary artery disease, recently diagnosed with  myasthenia gravis.  He is status post right knee arthroplasty March 2005;  however, he is now fully mobile.  The patient started having shortness of  breath about three weeks ago both on exertion and at rest, awful chest pain  on and off, although he states that this did not feel like his cardiac  pain.  Denies cough, denies hemoptysis.  Denies leg pain or swelling,  though history of recent prolonged travel or immobility.  In addition,  developed pain in the upper abdomen on and off since June 22, 2004.  Denies vomiting, denies diarrhea.  Gets constipated.  His sister brought him  to the emergency department.  Apparently, his PMD, Dr. Shon Hough, has no  privileges at [facility].   PAST MEDICAL HISTORY:  1.  Myasthenia gravis diagnosed three months ago.  2.  Coronary artery disease, status post PTCA and stent.  3.  Renal cell carcinoma 1990, status post last nephrectomy for DJD.  4.  Status post bilateral total knee replacement, left knee years ago, right      knee March 2005.   MEDICATIONS:  1.  Pyridostigmine bromide 60 mg t.i.d.  2.  Prednisone 10 mg working up to 40 mg q.d.  3.  Enteric-coated aspirin 81 mg q.d.  4.  Colace 100 mg.  5.  Magnesium 250 mg q.d.  6.  Isosorbide mononitrate 30 mg q.d.  7.  Iron q.d.  8.  Toprol XL 25 mg p.o. q.d.  9.   Dicyclomine one p.o. q.i.d.  10. CellCept 500 mg p.o. q.d.   ALLERGIES:  No known drug allergies.   SOCIAL HISTORY:  Married with three offspring-one of whom is deceased from  nonmedical causes.  Ex-smoker, quit 25-30 years ago.  Denies alcohol use or  drug abuse.   FAMILY HISTORY:  Has four brothers-one of whom has coronary artery disease.  Has sisters all of whom are alive and well.   REVIEW OF SYMPTOMS:  As per HPI and chief complaint.  Otherwise negative,  although the patient states that in the past three weeks he has also had  back pain and off, although he denies strong.   PHYSICAL EXAMINATION:  VITAL SIGNS:  Temperature 98.7, pulse 96 per minute,  regular respiratory rate 16, blood pressure 144/88 mmHg.  Oxygen saturation  96%.  GENERAL:  Appears comfortable at rest albeit somewhat drowsy secondary to  morphine that was started in the emergency  department.  No shortness of  breath at rest.  No syncope, pallor or jaundice.  NECK:  Supple.  No JVP not seen.  No palpable lymphadenopathy.  CHEST:  No palpable chest pain.  LUNGS:  Clear to auscultation.  No wheezes or crackles.  HEART:  S1 and S2 heard normal regular.  No murmurs.  ABDOMEN:  Obese.  The patient has left lateral scar, i.e. status post  nephrectomy and nontender.  No palpable organomegaly.  Normal bowel sounds.  EXTREMITIES:  Scars noted at both knees.  No pitting edema.  Has pronounced  varicosities but no ankle DVT.   LABORATORY DATA:  CBC shows WBC 6.7, hemoglobin 13.2, hematocrit 38.4,  platelets 217,000.  Electrolytes 3136, potassium 3.3, chloride 104, CO2 of  25, BUN 22, creatinine 1.0, glucose 79.  AST 23, ALT 49.  CK-MB less than  1.3.  Troponin I less than 0.05.  D-dimer 3.86.  Urinalysis negative.   CT chest/abdomen shows bilateral acute pulmonary emboli.  Midthoracic spine  fracture clearly acute.  Sigmoid diverticulitis.  Abdominal wall is  distended with probable small stool with evidence of acute  cholecystitis.  Chest x-ray showed bilateral atelectasis.  EKG shows sinus rhythm.  No acute  ischemic changes.   ASSESSMENT:  1.  Admit to stepdown unit for bilateral pulmonary emboli.  Commence      anticoagulation and oxygen therapy.  2.  Sigmoid diverticula:  We will manage with antibiotic therapy.  Bowel      rest overnight with clear fluids.  May expand that the following day      provided the patient tolerates this.  3.  Coronary artery disease:  Asymptomatic for now; however, we will      complete cardiac enzymes cycle.  4.  Myasthenia gravis:  Continue prednisone, pyridostigmine.  The patient is      also on CellCept so will continue this for now.  It is possible that the      patient may need neurologic consultation while in the hospital.  5.  Osteoporosis with fracture of the thoracic spine:  Given the patient's      past history of renal cell carcinoma, one has to maintain a low      threshold for working up for metastasis.  However, we will consider      Fosamax in due course as the patient is also on steroids.  Meanwhile, we      will administer analgesics as needed.  Further management of the      clinical course.       CO/MEDQ  D:  06/23/2004  T:  06/23/2004  Job:  [id number]

## 2011-02-19 NOTE — Discharge Summary (Signed)
[name], [name]                ACCOUNT NO.:  [id number]   MEDICAL RECORD NO.:  [id number]          PATIENT TYPE:  INP   LOCATION:  5150                         FACILITY:  [facility]   PHYSICIAN:  Rene Paci, M.D. LHCDATE OF BIRTH:  Aug 18, 1925   DATE OF ADMISSION:  08/28/2004  DATE OF DISCHARGE:  09/08/2004                                 DISCHARGE SUMMARY   DISCHARGE DIAGNOSES:  1.  Intractable back pain.  2.  Compression fractures at C7, T10, and L1.  3.  Acute hypoxic respiratory insufficiency.  4.  Weakness.  5.  Increased falls.  6.  Debilitation.  7.  Myasthenia gravis.   HOSPITAL COURSE:  [name] is a 77 year old white male who has had  increased recent falls since the diagnosis of myasthenia gravis.  The  patient fell prior to admission sustaining rib fractures and compression  fractures at T7.  The patient is having progressive difficulty caring for  himself at home.   PAST MEDICAL HISTORY:  1.  Myasthenia gravis recently diagnosed.  2.  Coronary artery disease.  3.  Percutaneous transluminal coronary angioplasty.  4.  History of renal cell carcinoma, status post left nephrectomy.  5.  Status post bilateral total knee replacements in 2002 and 2005.  6.  Morbid obesity.  7.  Bilateral PE diagnosed earlier in the month.   HOSPITAL COURSE:  #1 -  The patient presented with intractable back pain  secondary to compression fractures and rib fractures.  The patient was  evaluated by interventional radiology for possible vertebroplasty and  kyphoplasty.  Ultimately, he was able to undergo kyphoplasty at T10 and L1,  and vertebroplasty at T7.  The patient had improvement in his back pain  after treatment.  #2 -  PULMONARY:  The patient developed acute hypoxic respiratory  insufficiency.  This was transient and felt to be multi-factorial secondary  to over-sedation versus atelectasis secondary to poor inspiratory effort, as  well as underlying COPD versus recent  bilateral PE's.  There were no signs  of pneumonia or congestive heart failure.  Dr. Anne Hahn did not think that his  respiratory compromise was secondary to a myasthenia gravis crisis.  The  patient's oxygenation has improved.  The patient was seen briefly by  pulmonary who concurred with diagnosis and treatment.  The patient is stable  from a pulmonary standpoint for transfer to rehabilitation.  #3 -  NEURO:  As noted, the patient was recently diagnosed with myasthenia  gravis.  The patient was seen in consultation by neuro.  They make some  adjustments to his medications.  #4 -  DYSPHAGIA:  The patient did undergo a modified barium swallow during  this admission and diet recommendations were made.  #5 -  ELEVATED D-DIMER, POSSIBLY SECONDARY TO KNOWN BILATERAL PULMONARY  EMBOLUS:  The patient is chronically anti-coagulated.  Anti-coagulation was  revesered for his interventional radiology procedures, but has been resumed.  #6 -  WEAKNESS, DEBILITATION, AND FALLS:  Also probably multi-factorial  secondary to his underlying myasthenia gravis, but also contributed by his  recent compression fractures and PE's.  The patient was seen in consultation  by physical medicine and rehabilitation who felt he would benefit from  inpatient subacute rehabilitation.  Arrangements have been made for the  patient to be transferred to the subacute rehabilitation unit.   DISCHARGE LABORATORY DATA:  Prothrombin time 17.5, INR 1.7.  Hemoglobin  12.8.  BNP 132.  BUN 23, creatinine 1.  Urinalysis was negative.   DISCHARGE MEDICATIONS:  1.  Protonix 40 mg daily.  2.  MiraLax 17 g daily.  3.  Calcium carbonate.  4.  Os-Cal 500 plus D b.i.d.  5.  Imdur 30 mg daily.  6.  Aspirin 81 mg daily.  7.  Parafon 500 mg t.i.d.  8.  CellCept 500 mg b.i.d.  9.  Forteo injection 20 mcg daily.  10. Albuterol nebulizer with Atrovent q.i.d.  11. Mestinon 60 mg q.i.d.  12. Coumadin per pharmacy.  13. Prednisone 20 mg daily  x3 days, then discontinue b.i.d.  14. Ultram 50 mg to 100 mg q.4h. p.r.n.   DIET:  He has been advanced to a regular diet with thin liquids.      Laur   LC/MEDQ  D:  09/08/2004  T:  09/08/2004  Job:  [id number]   cc:   Marlan Palau, M.D.  [address]  [address]  [place]  [place] [address]  Fax: [phone]

## 2011-02-19 NOTE — Consult Note (Signed)
NAME:  [name], HOCKEY NO.:  [id number]   MEDICAL RECORD NO.:  [id number]          PATIENT TYPE:  INP   LOCATION:  3030                         FACILITY:  [facility]   PHYSICIAN:  Melvyn Novas, M.D.  DATE OF BIRTH:  [birth year]   DATE OF CONSULTATION:  08/29/2004  DATE OF DISCHARGE:                                   CONSULTATION   REASON FOR CONSULTATION:  Mr. Cardiff is a 77 year old gentleman with a  history of myasthenia gravis followed at [facility] by  Dr. Lesia Sago.  Mr. Ohanesian is admitted now for a procedure to alleviate  his severe lower back pain at kyphoplasty.  The patient has not just has a  history of myasthenia gravis, but also a history of bilateral pulmonary  emboli in the past, and has, according to the family, has been increasing  doses of CellCept for myasthenia treatment and steroid doses for myasthenia  treatment responded with confusion.  He is currently on Lovenox, Zyprexa, Os-  Cal, isosorbide mononitrate, Toprol, CellCept 500 mg daily, aspirin  81 mg daily, prednisone 10 mg b.i.d., ____________t.i.d., and was on  Coumadin per pharmacy protocol which has now been changed to Lovenox.   ALLERGIES:  No known drug allergies.   ADMISSION DIAGNOSES:  1.  Intractable back pain.  2.  Delirium.  3.  History of coronary artery disease, status post percutaneous      transluminal coronary angioplasty.  4.  Had a left-sided nephrectomy for renal cell carcinoma.  5.  Bilateral total knee replacements.  6.  Suffers currently from a T7 compression fracture which may be the main      reason for his back pain.   SOCIAL HISTORY:  The patient lives with his family.   PHYSICAL EXAMINATION:  VITAL SIGNS:  Stable.  Afebrile with a temperature of  97, blood pressure 114/66, pulse 88.  HEENT:  Normocephalic, atraumatic.  NEUROLOGIC:  The patient is alert, friendly, but asks repeatedly the same  questions.  He knows that he is here for a  procedure, but ask me three times  who will perform the procedure and what the name of the doctor is.  He then  again mistook me for the person who will actually perform the procedure.  He  also states that he needs something done quickly, as his back pain is  intractable.  He speaks with a hoarse voice and has mild dysarthria, no  aphasia.  His respiratory rate is 14 to 16.  He can hold his breath and  count to 20 with me.  Motor examination:  Equal grip strength bilaterally.  On the left side, mild cogwheeling and tremor.  Deep tendon reflexes are  equal.  The patient has pain with psoas elevation and prefers to lay flat in  bed.  A finger-nose test was performed and shows a mild pronator drift on  the left, as well as increasing tremor amplitude with finger-nose test, left  over right.  Sensory examination shows that the patient has not just focal  back pain over T9, but also an area  of back pain at T12 and L3, paraspinal  tenderness is associated.  He does not have loss of primary modalities.   I will discuss this case tomorrow with Dr. Lesia Sago.  I think that the  patient is ready to undergo the procedure and that the increasing doses of  steroids in the treatment of myasthenia might have caused him to have a  delirium,  but it would be an unusual response to CellCept.  We could use an injectable  form of __________to bridge over the period when the patient cannot take  p.o. medications perioperatively.  CellCept can be continued within eight  hours after surgery.  I do think it is safe to continue Zyprexa to treat the  acute delirium component.       CD/MEDQ  D:  08/29/2004  T:  08/29/2004  Job:  [id number]   cc:   Stacie Glaze, M.D. [facility]   Marlan Palau, M.D.  [address]  [address]  [place]  [place] [address]  Fax: [phone]

## 2011-02-19 NOTE — H&P (Signed)
NAME:  [name], [name]                ACCOUNT NO.:  [id number]   MEDICAL RECORD NO.:  [id number]          PATIENT TYPE:  INP   LOCATION:  1843                         FACILITY:  [facility]   PHYSICIAN:  Bimal R. Sherryll Burger, MD      DATE OF BIRTH:  1925/09/09   DATE OF PROCEDURE:  DATE OF DISCHARGE:                    STAT - MUST CHANGE TO CORRECT WORK TYPE   CHIEF COMPLAINT:  Shortness of breath.   HISTORY OF PRESENT ILLNESS:  This is a 77 year old gentleman with a  history of coronary artery disease status post: stenting to an RCA  lesion on October 17, 2006 myasthenia gravis. He has a 2-day history of  shortness of breath associated with a productive cough and tactile fever  and chills. He also describes some mild chest pressure with some left  arm pain; however, states that this is similar to his chest pain that he  has had in the past that has resulted in angioplasty. He denies any  headaches, rhinorrhea, sore throat, or sick contacts. Additionally, he  denies any orthopnea, PND, angina, or lower extremity edema. He does  express some increased fatigue, but no generalized or localized  weakness.   ALLERGIES:  NONE.   PAST MEDICAL HISTORY:  1. Coronary artery disease status post: PCI to the LAD in 2000 and      then repeat cardiac catheterization for chest pain in January of      2008, with a cipher stent placed to a 95% RCA lesion on October 17, 2006.  2. Remote history of PE, currently not on Coumadin.  3. Myasthenia gravis on Cellcept and prednisone, chronically.  4. Osteoporosis.  5. GERD.  6. History of renal cell carcinoma status post: left total      nephrectomy.  7. Osteoarthritis.  8. History of bilateral knee arthroplasty.   CURRENT MEDICATIONS:  1. Toprol-XL 50 mg daily.  2. Aspirin 325 mg daily.  3. Plavix 75 mg daily.  4. Crestor 10 mg daily.  5. Imdur 60 mg daily.  6. Cellcept 500 mg t.i.d.  7. Prednisone 15 mg daily.  8. Os-Cal 600 mg t.i.d.  9. Protonix 40  mg daily.  10.Mestinon 60 mg daily.  11.Sublingual nitroglycerine p.r.n.   SOCIAL HISTORY:  He lives with his wife. He is currently on disability  for his myasthenia gravis. He quit tobacco 40 years ago. He denies any  other habits.   FAMILY HISTORY:  Noncontributory.   REVIEW OF SYSTEMS:  In addition to the review of systems as above in the  HPI, he does admit to some chills and sweats. He also endorses some  wheezing, as well as a productive cough.   PHYSICAL EXAMINATION:  Temperature is 100.3, pulse of 94, respiratory  rate of 22, blood pressure of 145/64, O2 saturation is 94% on room air.  GENERAL: In no acute distress, alert and oriented times 3 with some  audible wheezing just during my history taking.  HEENT: Pupils equally round and reactive to light. Normocephalic,  atraumatic. Anicteric sclerae. Extraocular muscles are intact.  NECK: Supple with no lymphadenopathy. 2+ carotid  impulses. No carotid  bruits.  LUNGS: With bilateral bruits, wheezing. No rhonchi or crackles, with  good airway movement bilaterally.  CARDIOVASCULAR: Regular rate and rhythm, normal S1/S2, no murmurs, rubs,  or gallops.  ABDOMEN: Positive bowel sounds, soft, nontender, nondistended.  EXTREMITIES: 2+ pulses, no lower extremity edema, clubbing, nor  cyanosis.  NEUROLOGIC: Cranial nerves III-XII are intact with 5/5 motor strength  throughout.   LABORATORY DATA:  Chest x-ray shows low lung volumes without any obvious  infiltrate or edema. EKG shows a normal sinus rhythm at a rate 87 with  left axis deviation normal intervals with a Q-wave in V1, but without  any AQ, ST, or Q-wave changes. He has LVH by voltage. White count of  5.8, platelets of 213, hematocrit of 43, potassium of 4, creatinine of  1.2, glucose of 91. AVG shows a pH of 7.47, PCO2 of 31. He has blood  cultures times 2 pending, a nasal influenza washing was negative for  influenza and infection.   ASSESSMENT:  1. Bronchitis versus  early pneumonia.  2. Coronary artery disease status post: percutaneous coronary      intervention in January of 2008.  3. Myasthenia gravis.  4. History of left total nephrectomy.   PLAN:  The patient will be admitted for overnight observation. Given his  most-likely diagnosis of bronchitis versus community-acquired pneumonia  and his other risk factors we will just get 2 more sets of cardiac  enzymes just to ensure that this is not related to his recent stent  placement. I have started him on Levofloxacin for this upper respiratory  infection. He will continue on all of his remaining outpatient  medications. I have not started him on steroids, as he does not appear  to be in extremis at this point in time. I have also asked RT to provide  nebs as needed.           ______________________________  Eston Esters Sherryll Burger, MD     BRS/MEDQ  D:  11/09/2006  T:  11/09/2006  Job:  [id number]

## 2011-02-19 NOTE — Cardiovascular Report (Signed)
. [facility]  Patient:    [name], [name] Visit Number: [id number] MRN: [id number]          Service Type: MED Location: [id number] Attending Physician:  Pamella Pert Dictated by:   Lenise Herald, M.D. Proc. Date: 09/19/01 Admit Date:  09/17/2001 Discharge Date: 09/19/2001   CC:         Cardiac Catheterization Laboratory   Cardiac Catheterization  PROCEDURE:  Coronary angiography.  CARDIOLOGIST:  Lenise Herald, M.D.  COMPLICATIONS:  None.  INDICATIONS:  Mr. [name] is a 77 year old white male, a patient of Dr. Dessie Coma, with a history of coronary artery disease, status post PTCA and stenting of his LAD in April 2002.  The patient was readmitted on September 17, 2001, with recurrent chest pain.  He subsequently ruled out for a myocardial infarction.  He is now referred for a repeat cardiac catheterization.  DESCRIPTION OF PROCEDURE:  After giving an informed written consent, the patient is brought to the cardiac catheterization lab where the right groin was shaved, prepped, and draped in the usual sterile fashion. Electrocardiogram monitoring was established.  Using the modified Seldinger technique, a 6-French arterial sheath was inserted in the right femoral artery.  A 6-French diagnostic catheter was then used to perform a diagnostic angiography.  RESULTS 1. Left main coronary artery:  This reveals a large left main with    no significant disease. 2. Left anterior descending coronary artery:  The left anterior descending    coronary artery large vessels coursed to the apex after one diagonal    branch.  There is a stent noted in the proximal portion of the LAD, as    well as in the midportion of the LAD.  The proximal stent is widely    patent.  There is diffuse disease of the mid-LAD up to 50%-60% stenosis    involving the takeoff of the first diagonal.  The mid-LAD stent appears    widely patent.  The first diagonal is a  medium-sized vessel which    originates in the diffuse diseased segment of the LAD.  There is a 50%    ostial and proximal diagonal disease. 3. Left circumflex coronary artery:  The left circumflex artery is a    medium-sized vessel which coursed in the AV groove and gives off one    large bifurcating obtuse marginal branch.  The upper branch of the OM    is widely patent, with irregularities.  The lower branch of the OM is    subtotally occluded in its midsegment.  This is chronic. 4. Right coronary artery:  The right coronary artery is a large vessel    which is dominant and gives rise to a PDA as well as a posterolateral    branch.  The RCA is noted to have a 30% proximal lesion.  The PDA has    no significant disease.  The posterolateral branch is a large vessel    which bifurcates distally and has a 50% midvessel stenotic lesion.  No left ventriculogram is performed, secondary to the patients solitary kidney and mildly elevated creatinine.  HEMODYNAMICS:  Systemic arterial pressure:  132/75.  CONCLUSION 1. Widely patent stents noted in the proximal and mid left anterior    descending coronary artery. 2. Chronically-occluded lower branch of the first obtuse marginal. 3. No left ventriculogram performed. Dictated by:   Lenise Herald, M.D. Attending Physician:  Pamella Pert DD:  09/19/01 TD:  09/19/01 Job:  [id number]

## 2011-02-19 NOTE — H&P (Signed)
[facility]  Patient:    [name], [name] Visit Number: [id number] MRN: [id number]          Service Type: MED Location: 3W [phone] Attending Physician:  Pamella Pert Dictated by:   Abelino Derrick, P.A.C. Admit Date:  09/17/2001                           History and Physical  CHIEF COMPLAINT:  Chest pain.  HISTORY OF PRESENT ILLNESS:  [name] is a 77 year old male followed by Dr. Juluis Mire and previously seen by Dr. Lenise Herald.  He had a Cardiolite study prior to knee surgery in February of this year.  Cardiolite was abnormal and he has had catheterization done in April 2002.  This revealed a 95% proximal LAD lesion that was stented.  His EF was 45-50% then.  It should be noted the patient really has no history of exertional chest pain or angina; he had his Cardiolite for preoperative clearance.  He has a history of a remote MI in 1989; catheterization was done at that time but it does not sound like he had an angioplasty.  The patient came into the [facility] Emergency Room tonight after developing some epigastric pain and substernal chest pain at home.  Patient says he has been having indigestion-type symptoms off and on all day.  He attributed this to "gas."  He then developed a little chest tightness with this and took a nitroglycerin without relief.  He came to the emergency room for further evaluation.  His EKG in the emergency room showed a sinus rhythm without acute changes.  He is currently pain-free.  He denies any associated nausea, vomiting, or diaphoresis or shortness of breath. He does have some vague aching in his left arm.  PAST MEDICAL HISTORY:  His past medical history is remarkable for renal cell CA; he had a left nephrectomy in the past.  His creatinine in April of 2002 was 1.2.  He has DJD and had a left total knee replacement in 1999 and needs his right knee done.  He denies any history of hypertension or  diabetes.  He may have had hyperlipidemia in the past; he was discharged in April on Lipitor but was unable to tolerate this.  CURRENT MEDICATIONS: 1. Toprol-XL 25 mg a day. 2. Aspirin q.d. 3. Nexium p.r.n.  ALLERGIES:  He has no known drug allergies.  SOCIAL HISTORY:  He is married and lives with his wife.  He used to smoke but quit several years ago.  FAMILY HISTORY:  Family history is remarkable in that one brother of four has a history of coronary disease.  His father died at age 42, mother died at age 45.  REVIEW OF SYSTEMS:  Review of systems is remarkable for situational stress at home, patient apparently is having a stressful situation at home regarding his daughter and some financial problems.  He says he is not sleeping well.  He has a history of sinus problems in the past but none currently.  Denies any history of GI bleeding or peptic ulcer disease or thyroid disease.  PHYSICAL EXAMINATION:  VITAL SIGNS:  Blood pressure 134/76, pulse 72 and regular.  O2 saturation is 96% on 2 L.  GENERAL:  He is a well-developed, overweight male in no acute distress.  HEENT:  Normocephalic.  Extraocular movements are intact.  Sclerae nonicteric. Lids and conjunctivae are within normal limits.  NECK:  Without bruit and without JVD.  CHEST:  Basilar crackles bilaterally.  CARDIAC:  Regular rate and rhythm with an S4 gallop.  No significant murmur.  ABDOMEN:  Obese, nontender.  Bowel sounds are present.  He does have a left upper quadrant surgical scar.  EXTREMITIES:  Without edema.  Posterior tibial pulses are 3+/4 bilaterally. Femoral pulses are present.  There are no femoral bruits noted.  NEUROLOGIC:  Exam is grossly intact.  He is awake, alert and cooperative and moves all extremities without obvious deficit.  SKIN:  Warm and dry.  LABORATORY DATA:  EKG, as noted, shows normal sinus rhythm without acute changes.  Chest x-ray shows bilateral left lower lobe  atelectasis.  Other labs are pending.  IMPRESSION: 1. Unstable angina. 2. History of coronary disease, left anterior descending stenting,    April of 2002, after abnormal Cardiolite study that was done    preoperatively. 3. Degenerative joint disease. 4. Bilateral atelectasis on x-ray; the patient is fairly sedentary at home. 5. History of questionable remote myocardial infarction in 1989.  PLAN:  Patient is admitted to telemetry.  We will check followup CK-MBs and troponins.  He has been started on IV heparin.  If his enzymes are negative and followup EKG is normal, he may be discharged for outpatient Cardiolite study, if not, he will probably need restudy.  Dictated by:   Abelino Derrick, P.A.C. Attending Physician:  Pamella Pert DD:  09/17/01 TD:  09/18/01 Job: [id number]

## 2011-02-19 NOTE — Cardiovascular Report (Signed)
NAMESHANNA, STRENGTH NO.:  [id number]   MEDICAL RECORD NO.:  [id number]          PATIENT TYPE:  INP   LOCATION:  6526                         FACILITY:  [facility]   PHYSICIAN:  Cristy Hilts. Jacinto Halim, MD       DATE OF BIRTH:  [birth year]   DATE OF PROCEDURE:  10/17/2006  DATE OF DISCHARGE:                            CARDIAC CATHETERIZATION   PROCEDURE PERFORMED:  1. Left ventriculography.  2. Selective left coronary angiography.  3. Percutaneous transluminal coronary angiography and stenting of the      distal right coronary artery.   INDICATION:  [name] is a 77 year old gentleman with a history of  coronary artery disease with angioplasty to his proximal and mid LAD  performed by Dr. Lenise Herald with implantation of 4.0 x 23-mm Penta  and a 2.5 x 8-mm Pixel in 2002.  He had been doing fairly well.  He is  now being admitted to the hospital with symptoms very typical for  unstable angina.  He also came in with chest pain and a pulmonary  embolism was ruled out as his D-dimer was slightly elevated by  performing a CT scan of his chest.  Of note, patient has had a history  of pulmonary embolism in the past, and this was related to his knee  surgery, following which he had a pulmonary embolism.  He has been off  of Coumadin for a year and had been doing well until 3 days ago when he  started experiencing chest discomfort.  Hence, he is now brought back to  the cardiac catheter lab to evaluate his coronary artery.   HEMODYNAMIC DATA:  The left arterial pressures were 111/110 - with an  end-diastolic pressure of14 mmHg.  The aortic pressure was 110/64 with a  mean of 84 mmHg.  There was no pressure gradient across the aortic  valve.   ANGIOGRAPHIC DATA:  Left ventricular systolic function was normal with  the ejection fraction estimated around 55%.  There was no regional wall  motion abnormality.  There was no significant mitral regurgitation.   Right coronary artery:   The right coronary is a large and super-dominant  vessel giving a large PDA and a large PLA, which supplies the large part  of the lateral wall.  He has got mild diffuse luminal irregularity.  In  the distal right coronary artery, it has a 90-95% stenosis at the  bifurcation of the secondary PLA branch.  Distally this PLA branch, one  of the small branches is subtotally occluded.   Left main:  Left main is a large-caliber vessel.  It is very short.   Circumflex:  Circumflex is a moderate-caliber vessel.  It has mild  calcification.  It has got mild diffuse luminal irregularity.  Proximal  circumflex has a 40-50% stenosis.  Distal circumflex is subtotally  occluded.  This appears chronic and appears to be calcific.   LAD:  LAD is a large-caliber vessel in the proximal segment. It has  again got diffuse luminal irregularity.  The previously-placed stent to  the proximal and mid LAD are  widely patent.  The diagonal one which is  small has an ostial 70% stenosis and appears to be stent jailed.  There  is a small diagonal 2 and a small-to-moderate-sized diagonal 3.   <Interventional DATA/>  Successful PTCA and stenting of the distal right coronary artery with  the implantation of a 3.0 x 13-mm Cypher.  This stent was posterolateral  to the 3.25 x 8-mm Quantum at 20 atmospheres of pressure.  Overall, the  stenosis was reduced from 90-95% to 0% with TIMI-3 to TIMI-3 flow  maintained at the end of the procedure.   RECOMMENDATION:  Patient will be continued on aspirin and will also be  continued on Plavix for at least a period of year.  Excellent results  are noted.  Continued brisk modification is indicated.  Patient will  probably be discharged home in the morning if he remains stable.   A total of 180-190 mL of contrast was utilized for diagnostic and he  wishes to proceed.   TECHNIQUE OF THE PROCEDURE:  Under usual sterile precautions using a 6-  French left femoral arterial access, a  6-French multipurpose B2 catheter  was advanced in the ascending aorta with 0.035 Jwire.  The catheter was  gently advanced to the left ventricle and hand contrast injection of the  LV was performed in both RAO and LAO projections .  Catheter was flushed  with saline pulled into the ascending aorta.  Right coronary artery was  engaged and angiography was performed.  Then the left main coronary  artery was selectively engaged and angiography is performed.  Then the  catheter was pulled out of the body in the usual fashion.   TECHNICAL INTERVENTION:  The 6-French sheath was exchanged to a 7-French  sheath.  Using a 7-French FR-4 guide and using heparin and Integralin  for anticoagulation, a 190 cm x 0.014 80W guidewire was utilized, and  with moderate to moderate difficulty and with the help of a balloon  support, I was able to cross through the distal high-grade stenosis of  the PLA branch of the right coronary artery.  Then using a 2.5 x 15-mm  Maverick, a balloon angioplasty was performed at 14 atmospheres of  pressure x2.  Then I decided to proceed with stenting of the same vessel  with a 3.0 x 13-mm Cypher.  The stent was deployed at 6 atmospheres of  pressure x2 for 45 seconds.  This stent was then post dilated with a  3.25 x 8-mm Quantum at 18 atmospheres distally and 20 atmospheres of  pressure proximally with total reduction of stenosis from 90-95% to 0%  with excellent results.  Then the guidewires withdrawn angiography.  Guide catheter disengaged and pulled out of the body.  The patient  tolerated the procedure.  No immediate complications noted.      Cristy Hilts. Jacinto Halim, MD  Electronically Signed     JRG/MEDQ  D:  10/17/2006  T:  10/18/2006  Job:  [id number]   cc:   Darlin Priestly, MD  Tera Mater. Clent Ridges, MD

## 2011-02-19 NOTE — Discharge Summary (Signed)
[name], [name]                ACCOUNT NO.:  [id number]   MEDICAL RECORD NO.:  [id number]          PATIENT TYPE:  ORB   LOCATION:  4524                         FACILITY:  [facility]   PHYSICIAN:  Erick Colace, M.D.DATE OF BIRTH:  [birth year]   DATE OF ADMISSION:  09/08/2004  DATE OF DISCHARGE:  09/23/2004                                 DISCHARGE SUMMARY   DISCHARGE DIAGNOSES:  1.  Multiple compression fractures, T7, T10, and L1, requiring      vertebroplasty of T7 and kyphoplasty of T10 and L1.  2.  Myasthenia gravis.  3.  Continued hypoxia.  4.  Pain control better overall.  5.  New mild compression fracture T11.   HISTORY OF PRESENT ILLNESS:  [name] is a 77 year old male with recent  diagnosis of myasthenia gravis and PEs, increased falls with rib fractures,  and low back pain secondary to T7 compression fracture, admitted August 28, 2004 with constipation, low back pain, and inability of family to care  for patient at home.  On September 01, 2004, once INR had trended downward,  the patient underwent vertebroplasty T7 and kyphoplasty T10 and L1 by  Deveshwar.  The patient was noted to have persistent hypoxia this admission,  and pulmonary was consulted for input.  They recommended discontinuing  Zyprexa and metoprolol and question of MG exacerbation contributing to  hypoxia.  Dr. Anne Hahn was consulted and recommended swallowing evaluation to  rule out aspiration.  No signs of myasthenia gravis crisis per his exam, and  BS then showed less penetration of thins with large bolus.  Patient placed  on D3 diet with thin liquids, initially advanced to regular currently.  The  patient was started on steroids with question of COPD.  This is currently  being tapered off.  He continues to have complaints regarding pain with poor  mobility.  He is at moderate assist with transfers, is able to stand and  march in place with plus to total assist, with complaints of pain with  activity.  SACU was consulted for progression.   PAST MEDICAL HISTORY:  1.  Renal cancer, with left nephrectomy.  2.  GERD.  3.  PE, diagnosed September 2005.  4.  Myasthenia gravis.  5.  Bilateral total knee replacement.  6.  Obesity.  7.  Coronary artery disease, with PTCA.   ALLERGIES:  No known drug allergies.   SOCIAL HISTORY:  The patient lives with family.  He is married.  Wife unable  to provide much assistance.  He requires some assist for transfers.  The  patient's nephew is assisting with this.  The patient does not use any  tobacco or alcohol.   HOSPITAL COURSE:  [name] was admitted to subacute on September 08, 2004 for SACU level therapies to consist of PT, OT, __________.  Post  admission, the patient was maintained on Lovenox until Coumadin on a  therapeutic basis.  The patient had much complaints regarding pain control  as well as poor p.o. intake and poor motivation initially.  He required lots  of encouragement to  even sit out of bed for short periods of time.  The  patient was started on Ultram q.i.d., and then Vicodin slowly added, with  much improvement in his pain control.  The patient's p.o. intake has slowly  improved during his stay.  No complaints of back pain reported by the time  of discharge.  He has continued with hypoxia throughout his stay.  __________ dropped from 85 to 92%.  His saturations did improve with  activity.  He has been encouraged to continue with incentive spirometry q. 1-  2 hours while awake.  A follow-up chest x-ray was done secondary to  continued shortness of breath and hypoxia.  On September 21, 2004, chest x-  ray showed slight increased bibasilar atelectasis.  Also, incidental note  was made of a mild compression fracture of T11 which was new since MRI of  thoracic spine dated August 31, 2004.   The patient has been on Forteo during his stay.  Attempts were made to teach  the patient and wife regarding  self-injections.  However, this was very  difficult and unsuccessful.  Also of issue has been cost regarding the  medications in terms of insurance coverage.  The patient was changed over to  Fosamax weekly to help with bone density.  Attempts were made to contact the  insurance company regarding some assistance program.  They have recommended  following up with a [employer] representative regarding best medical insurance  coverage for this medication in the future.  Dr. Claris Che office was contacted  regarding this, and they will followup with the patient regarding resumption  of Forteo in the future.  Labs checked during this stay:  Labs of September 22, 2004 showed hemoglobin 11.4, hematocrit 33.3, white count 4.0, platelets  272.  Check of electrolytes at admission revealed sodium 138, potassium 4,  chloride 104, CO2 24, BUN 17, creatinine 0.9, glucose 105.  LFTs revealed  total protein 5, albumin 2.3, total bilirubin 0.5, AST 19, ALT 65, alkaline  phosphatase 94.   The patient's kyphoplasty puncture sites have healed well.  Dr. Leonides Cave of  neuropsych has been following for support.  The patient's mood and  motivation have improved greatly during his stay.  At the time of discharge,  the patient was at a supervision level for self-care.  He is supervision  level for toileting, for toilet transfers, modified independent for toilet  hygiene.  He is at supervision level for transfers, supervision level for  ambulating 125 feet with a rolling walker.  Further followup therapies to  include home health PT, OT, and R.N. by [facility].  Home health  R.N. to draw next pro time on September 25, 2004, with results to Dr. Claris Che  office.  Home health R.N. also to help complete out current Forteo pen and  medication adjustment and medication administration.  She will also help the  patient with medication monitoring.  On September 23, 2004, the patient is discharged to home.   DISCHARGE  MEDICATIONS:  1.  Toprol XL 25 mg daily.  2.  Mestinon 60 mg q.i.d.  3.  Coated aspirin 81 mg daily.  4.  CellCept 250 mg two p.o. b.i.d.  5.  Pepcid 20 mg daily.  6.  Imdur 30 mg daily.  7.  MiraLax 17 g in 8 ounces of fluid per day.  8.  Coumadin 5 mg q.p.m.  9.  Ultram 50 mg q.i.d.  10. Vicodin 1-2 q.4-6 h. p.r.n. pain.  11.  Fosamax 70 mg on Sundays.  12. Combivent 2 puffs t.i.d. to q.i.d. p.r.n.  13. Os-Cal Plus D one p.o. t.i.d.   ACTIVITY:  Supervision for walking.  Keep using walker.   DIET:  Regular.   SPECIAL INSTRUCTIONS:  May use Tylenol or Vicodin for pain.  Use oxygen for  now.  No alcohol, no smoking, no driving.  [facility] to provide  home health PT, OT, R.N.   FOLLOWUP:  Patient to follow up with Dr. Anne Hahn in the next couple weeks.  Follow up with Dr. Clent Ridges for routine check in two weeks.  Follow up with Dr.  Wynn Banker as needed.      Pame   PP/MEDQ  D:  09/23/2004  T:  09/24/2004  Job:  [id number]   cc:   Tera Mater. Clent Ridges, M.D. [facility]   Marlan Palau, M.D.  [address]  [address]  [place]  [place] [address]  Fax: [phone]   Grandville Silos. Corliss Skains, M.D.  [address]  [place]  [place] [address]  Fax: [phone]   Pollyann Savoy, M.D.  [address]  [place], [place] [address]  Fax: [phone]

## 2011-02-19 NOTE — Discharge Summary (Signed)
[name], Gerald NO.:  [id number]   MEDICAL RECORD NO.:  [id number]          PATIENT TYPE:  INP   LOCATION:  5156                         FACILITY:  [facility]   PHYSICIAN:  Michaelyn Barter, M.D. DATE OF BIRTH:  1924-12-18   DATE OF ADMISSION:  06/23/2004  DATE OF DISCHARGE:  07/03/2004                                 DISCHARGE SUMMARY   CONSULTATIONS:  General surgery with Dr. Ollen Gross. [name], III.   CHIEF COMPLAINT:  The chief complaint at the time of admission was shortness  of breath, chest pain, and abdominal pain.   HISTORY OF PRESENT ILLNESS:  [name] is a 77 year old gentleman with a  past medical history of coronary artery disease and myasthenia gravis.  At  the time of his presentation, he complained of having shortness of breath,  which had started approximately 3 weeks prior to admission, and occurred on  exertion and at rest.  He also experienced chest pain on and off, which he  described as being something different than the cardiac pain that he had  experienced in the past.  He denied having cough or hemoptysis.  There was  no complaint of leg pain or swelling.  He did give a history of recent  travel.  He also complained of pain in the upper abdominal area on and off  since June 22, 2004.  He denied emesis or diarrhea.   PAST MEDICAL HISTORY:  1.  Myasthenia gravis diagnosed approximately 3 months ago.  2.  Coronary artery disease status post PTCA and stent placement.  3.  Renal cell carcinoma diagnosed in 1990, status post questionable      nephrectomy.  4.  Status post bilateral total knee replacement approximately 3 years ago      for the left knee, and the right knee was replaced in March of 2005.   ALLERGIES:  The patient has no known drug allergies.   SOCIAL HISTORY:  Cigarettes positive; however, the patient quit  approximately 25-30 years ago.  Alcohol - the patient denied.  Street drugs  - the patient denied.   FAMILY  HISTORY:  One brother has coronary artery disease.   HOSPITAL COURSE:  1.  The patient had a portable chest x-ray completed, and the final      impression of that was suboptimal inspiration causes bibasilar      atelectasis.  Likewise, to further work-up his complaint of shortness of      breath and chest pain, he underwent a chest CT scan with contrast, the      final impressions of which were (1) extensive acute bilateral pulmonary      emboli with associated bilateral pulmonary atelectasis, and (2) possible      acute mid-T spine fracture.  The patient was subsequently started on      heparin IV as treatment of his pulmonary emboli.  Likewise, because of      his abdominal pain, he had a CT scan of his abdomen and pelvis      completed, the final results of which were (1) no definite  acute      abdominal findings; however, the gallbladder does appear mildly      distended, and there are small __________ within its lumen.      Cholecystitis cannot entirely be excluded clearly clinically.  In      addition, it also revealed a right renal cyst, with no evidence of      metastatic renal cell carcinoma status post left nephrectomy.  Over the      course of his hospitalization, the patient did complain of some right      lateral side chest pain; however, he stated that the pain medication      given did control his symptoms.  By June 26, 2004, the patient was      stating that he had started feeling much better; however, he did state      that his right upper quadrant pain was persistent.  June 27, 2004,      the patient was stating again that his chest pain was better, although      he did experience some breakthrough pain on and off once the medication      would wear off.  By the time of his discharge, the patient stated that      his chest pain had resolved.  2.  The second issue was the abdominal pain.  Because of the findings on the      CT scan, surgery was consulted, and  Dr. Ollen Gross. [name] responded to the      consult.  His final assessment and plan of the patient was that the      patient may have a small stone in his gallbladder, but there were no      inflammatory changes of the gallbladder, and given his other medical      condition (i.e. pulmonary emboli), he stated that he would not recommend      surgery at this time to remove a gallbladder that may be asymptomatic.      At that time, the patient was not complaining of abdominal pain, fever,      and his white count was within normal limits, which went against a      diagnosis of sigmoid diverticulitis also.  He stated that those findings      may be related to the fact that the patient was on prednisone and      immunosuppression.  He did recommend, however, that the patient remain      on antibiotics for several days, and also to continue to monitor the      patient, and stated that if the patient continues to have no abdominal      pain and does well, he probably will not require any surgery at all.      Again, with regards to the patient's abdominal pain, he was treated      empirically with ciprofloxacin and Flagyl, and these were provided      because of the presence of sigmoid diverticula.  However, again, over      the course of the patient's hospitalization, his abdominal complaints      resolved, and by the day of discharge, he stated that he felt much      better, and again requested to be discharged home.  3.  Coronary artery disease.  The patient does have a history of coronary      artery disease; however, over the course of his hospitalization, he  never complained of cardiac related symptoms.  4.  Myasthenia gravis.  The patient's myasthenia gravis remained controlled      over the course of his hospitalization, and he was continued on his home      regimen of prednisone and pyridostigmine.  Likewise, CellCept was also      continued. 5.  Osteoporosis with fracture of the thoracic  spine.  This was just      monitored over the course of the patient's hospitalization.   CONDITION ON THE DATE OF DISCHARGE:  At the date of discharge, the patient  presented with no new complaints.  He was found to be in no obvious distress  likewise.  His vitals at the time of discharge revealed a temperature of  97.9, heart rate 83, respirations 20, blood pressure 138/84, and the CBGs  from the previous 24 hours were 88, 110, and 131 respectively.  The decision  was made to discharge the patient home.   DISCHARGE MEDICATIONS:  1.  Os-Cal plus D one tablet b.i.d.  2.  Warfarin 2.5 mg p.o. daily.  3.  Isosorbide mononitrate 30 mg p.o. daily.  4.  Toprol-XL 25 mg one tablet daily.  5.  CellCept 500 mg p.o. daily.  6.  Colace 100 mg b.i.d.  7.  Aspirin 81 mg p.o. daily.  8.  Prednisone 10 mg p.o. daily.  9.  Protonix 40 mg p.o. daily.  10. Pyridostigmine 60 mg one tablet t.i.d.  11. Darvocet 50/325 mg one tablet q.6h. p.r.n. for pain.  12. Dicyclomine 10 mg one tablet q.6h.   DISCHARGE INSTRUCTIONS:  The patient was instructed to see Dr. Benn Moulder at  [facility] before home health could see him at home, and he had  an outpatient appointment set up to see Dr. Abran Cantor on July 06, 2004.  He was  also instructed to take all of his medications as prescribed.   DIAGNOSES AT THE TIME OF DISCHARGE:  1.  Bilateral pulmonary emboli.  2.  Possible cholelithiasis/possible cholecystitis.  3.  Compression fracture.  4.  Myasthenia gravis.  5.  Olecranon bursitis.  6.  Stable coronary artery disease.      Orla   OR/MEDQ  D:  08/17/2004  T:  08/17/2004  Job:  [id number]

## 2011-02-19 NOTE — Discharge Summary (Signed)
[name], [name]                ACCOUNT NO.:  [id number]   MEDICAL RECORD NO.:  [id number]          PATIENT TYPE:  INP   LOCATION:  1414                         FACILITY:  [facility]   PHYSICIAN:  Lucrezia Starch. Earlene Plater, M.D.  DATE OF BIRTH:  [birth year]   DATE OF ADMISSION:  06/06/2006  DATE OF DISCHARGE:  06/09/2006                                 DISCHARGE SUMMARY   DIAGNOSES:  1. Urinary tract infection.  2. Immunosuppression.  3. Gross hematuria.   OPERATIVE PROCEDURES:  None.   HISTORY AND PHYSICAL EXAMINATION:  Mr. Meidinger is a very nice, 77 year old,  white male who presented with fever to 103, development of frequency and  gross hematuria.  He has history of myasthenia gravis, and was admitted for  evaluation.  He is also immunosuppressed and on CellCept and prednisone for  the myasthenia gravis.   For past medical history, social history, family history, review of systems,  please see the signed past medical history sheet for full details.   On physical examination he is a well-nourished, well-developed, well-groomed  male in mild distress, oriented x3 but had some slowing of speech.  Head,  eyes, ears, nose and throat normal.  Neck supple without masses or  thyromegaly.  Chest had normal diaphragmatic motion.  Abdomen was soft,  nontender without masses or organomegaly or hernia.  Extremities had trace  bilateral lower extremity edema.  Neurologic was intact but again he is slow  due to the myasthenia gravis.   HOSPITAL COURSE:  The patient was admitted, placed on IV antibiotics and by  June 07, 2006, he was afebrile.  The labs were markedly improved.  He  was still very weak, voiding with some hematuria.  Blood culture and urine  culture were pending.  On the evening of June 07, 2006, he had a low-  grade fever.  A renal ultrasound revealed a cyst only.  Preliminary urine  culture and sensitivity was Escherichia coli.  By June 08, 2006, his  temperature max was 101  degrees Fahrenheit.  He was switched to Rocephin IV  to tobramycin due to the culture and sensitivity and IV was changed to  saline lock.  He was subsequently discharged on June 09, 2006,  tolerating a diet, voiding well.  Urine culture and sensitivity was  Escherichia coli sensitive to Cipro.   DISCHARGE MEDICATIONS:  Oral Cipro.   FOLLOWUP:  He was to return in 2 weeks for an office cystoscopy due to the  hematuria.  Instructions were given.   CONDITION ON DISCHARGE:  Improved.      Ronald L. Earlene Plater, M.D.  Electronically Signed     RLD/MEDQ  D:  07/06/2006  T:  07/08/2006  Job:  [id number]

## 2011-02-19 NOTE — H&P (Signed)
NAMERALPHEAL, ZAPPONE NO.:  [id number]   MEDICAL RECORD NO.:  [id number]          PATIENT TYPE:  OBV   LOCATION:  1414                         FACILITY:  [facility]   PHYSICIAN:  Heloise Purpura, MD      DATE OF BIRTH:  05-04-1925   DATE OF ADMISSION:  06/06/2006  DATE OF DISCHARGE:                                HISTORY & PHYSICAL   PRIMARY UROLOGIST:  Dr. Gaynelle Arabian.   PRIMARY CARE PHYSICIAN:  Dr. Gershon Crane.   CHIEF COMPLAINT:  1. Fever.  2. Gross hematuria.  3. Urinary frequency.   HISTORY:  Mr. Montesdeoca is a 77 year old gentleman with a history of renal cell  carcinoma and BPH who began having a fever up to 103 degrees yesterday  associated with gross hematuria and urinary frequency.  He continued to feel  poorly and presented to the [facility] earlier today.  He  was diagnosed with a urinary tract infection there.  He also has myasthenia  gravis and is on immunosuppression with prednisone and CellCept.  Due to his  immunosuppressed status as well as the fact the patient was found to be  slightly tachycardiac at that time, he was sent to the [facility] Emergency  Room for further urologic evaluation and hospital admission.  He denies a  history of flank pain or specific history of subjective chills.  He states  that he has had some urinary urgency as well as some urge incontinence which  he has at baseline as well.  He denies a history of gross hematuria up until  the last 24 hours.  He denies a history of frequent urinary tract  infections, urolithiasis, or GU malignancy, besides his renal cell  carcinoma.  He is status post a left radical nephrectomy in 1990 by Dr.  Earlene Plater.   PAST MEDICAL HISTORY:  1. Myasthenia gravis.  2. Coronary artery disease.  3. Osteoporosis.  4. Gastroesophageal reflux disease.  5. Renal cell carcinoma.  6. Osteoarthritis.   PAST SURGICAL HISTORY:  1. Bilateral total knee arthroplasty.  2. Left radical  nephrectomy.  3. Right upper extremity trauma with internal fixation.   MEDICATIONS:  1. CellCept.  2. Prednisone  3. Aspirin.  4. Calcium supplementation.  5. Fosamax.  6. Toprol-XL.  7. Isosorbide.  8. Vicodin,  9. Protonix.  10.Dicyclomine   ALLERGIES:  NO KNOWN DRUG ALLERGIES.   FAMILY HISTORY:  No history of GU malignancy.   SOCIAL HISTORY:  The patient did smoke but quit smoking 40 years ago.   REVIEW OF SYSTEMS:  UROLOGIC REVIEW OF SYSTEMS:  Are as in the history of  present illness.  CARDIOVASCULAR:  No chest pain or palpitations.  PULMONARY:  No shortness of breath.  NEUROLOGIC: Grossly intact, without  recent neurologic changes.   PHYSICAL EXAMINATION:  VITAL SIGNS:  The patient's vitals in the [facility]  Emergency Room are currently pending.  He was found to be afebrile at the  Urgent Care Center but with a heart rate of 110.  CONSTITUTIONAL:  The patient is alert and oriented and  in no acute distress,  without labored breathing.  CARDIOVASCULAR:  Regular rate and rhythm, without obvious murmurs.  LUNGS:  Clear bilaterally.  ABDOMEN:  The patient does have a well-healed left-sided flank scar.  No  abdominal masses.  GU:  The patient has a normal male phallus, with grossly bloody urine.  EXTREMITIES:  Trace bilateral lower extremity edema.  SKIN:  Warm and well perfused.  NEUROLOGIC:  Grossly intact.   LABORATORY STUDIES:  The patient's white blood count is elevated at 12.9.  Urinalysis demonstrated positive nitrite and large leukocyte esterase urine.  Urine culture is pending.   IMPRESSION:  Febrile, urinary tract infection with immunosuppression and  gross hematuria.   PLANS:  Due to the fact that the patient is immunosuppressed, he does have a  complex urinary tract infection and I feel should be closely monitored for  that reason.  He also has a solitary kidney.  Therefore, I will plan to  admit him for IV fluid hydration and IV antibiotics and close  monitoring.  I  will plan to check laboratory work to check his renal function.  Blood  cultures and urine cultures will also be sent.  The patient will be admitted  under the care of Dr. Gaynelle Arabian for further decision-making.           ______________________________  Heloise Purpura, MD  Electronically Signed     LB/MEDQ  D:  06/06/2006  T:  06/06/2006  Job:  [id number]

## 2011-08-06 ENCOUNTER — Encounter: Payer: Self-pay | Admitting: Family Medicine

## 2011-08-09 ENCOUNTER — Encounter: Payer: Self-pay | Admitting: Family Medicine

## 2011-08-09 ENCOUNTER — Ambulatory Visit (INDEPENDENT_AMBULATORY_CARE_PROVIDER_SITE_OTHER): Payer: Medicare Other | Admitting: Family Medicine

## 2011-08-09 VITALS — BP 136/82 | HR 91 | Temp 97.6°F | Wt 271.0 lb

## 2011-08-09 DIAGNOSIS — K589 Irritable bowel syndrome without diarrhea: Secondary | ICD-10-CM

## 2011-08-09 DIAGNOSIS — M1990 Unspecified osteoarthritis, unspecified site: Secondary | ICD-10-CM

## 2011-08-09 DIAGNOSIS — Z23 Encounter for immunization: Secondary | ICD-10-CM

## 2011-08-09 MED ORDER — HYDROCODONE-ACETAMINOPHEN 5-500 MG PO TABS: 1.0000 | ORAL_TABLET | Freq: Four times a day (QID) | ORAL | Status: DC | PRN

## 2011-08-09 NOTE — Progress Notes (Signed)
  Subjective:    Patient ID: [name], male    DOB: 30-May-1925, 77 y.o.   MRN: [id number]  HPI Here complaining of irregular BMs for the past few months. He has complained of this before, and we recommended he use Miralax daily. However he uses this only sporadically, so his BMs alternate between constipation and diarrhea. He feels bloated at times, and he passes a lot of gas. He will have small hard BMs for several days followed by liquid stools for several days. No fever or blood in the stool. Appetite is good.    Review of Systems  Constitutional: Negative.   Gastrointestinal: Positive for diarrhea and constipation. Negative for nausea, vomiting, abdominal pain, blood in stool, abdominal distention and rectal pain.       Objective:   Physical Exam  Constitutional: He appears well-developed and well-nourished.  Abdominal: Soft. Bowel sounds are normal. He exhibits no distension and no mass. There is no tenderness. There is no rebound and no guarding.          Assessment & Plan:  This is classic IBS. I again advised him to use Miralax on a daily basis.

## 2011-10-28 ENCOUNTER — Encounter (HOSPITAL_COMMUNITY): Payer: Self-pay | Admitting: *Deleted

## 2011-10-28 ENCOUNTER — Emergency Department (HOSPITAL_COMMUNITY)
Admission: EM | Admit: 2011-10-28 | Discharge: 2011-10-29 | Disposition: A | Discharge status: Home / Self Care | Payer: Medicare Other | Attending: Emergency Medicine | Admitting: Emergency Medicine

## 2011-10-28 DIAGNOSIS — I10 Essential (primary) hypertension: Secondary | ICD-10-CM | POA: Insufficient documentation

## 2011-10-28 DIAGNOSIS — R509 Fever, unspecified: Secondary | ICD-10-CM | POA: Insufficient documentation

## 2011-10-28 DIAGNOSIS — I2510 Atherosclerotic heart disease of native coronary artery without angina pectoris: Secondary | ICD-10-CM | POA: Insufficient documentation

## 2011-10-28 DIAGNOSIS — R300 Dysuria: Secondary | ICD-10-CM | POA: Insufficient documentation

## 2011-10-28 DIAGNOSIS — G7000 Myasthenia gravis without (acute) exacerbation: Secondary | ICD-10-CM | POA: Insufficient documentation

## 2011-10-28 DIAGNOSIS — Z79899 Other long term (current) drug therapy: Secondary | ICD-10-CM | POA: Insufficient documentation

## 2011-10-28 DIAGNOSIS — N390 Urinary tract infection, site not specified: Secondary | ICD-10-CM | POA: Insufficient documentation

## 2011-10-28 LAB — DIFFERENTIAL
Basophils Relative: 0 % (ref 0–1)
Lymphocytes Relative: 11 % — ABNORMAL LOW (ref 12–46)
Monocytes Absolute: 0.9 10*3/uL (ref 0.1–1.0)
Monocytes Relative: 10 % (ref 3–12)
Neutro Abs: 6.9 10*3/uL (ref 1.7–7.7)
Neutrophils Relative %: 77 % (ref 43–77)

## 2011-10-28 LAB — CBC
HCT: 42.9 % (ref 39.0–52.0)
Hemoglobin: 14.5 g/dL (ref 13.0–17.0)
RBC: 4.57 MIL/uL (ref 4.22–5.81)
WBC: 9 10*3/uL (ref 4.0–10.5)

## 2011-10-28 MED ORDER — SODIUM CHLORIDE 0.9 % IV BOLUS (SEPSIS)
500.0000 mL | INTRAVENOUS | Status: AC
  Administered 2011-10-28: 500 mL via INTRAVENOUS

## 2011-10-28 MED ORDER — SODIUM CHLORIDE 0.9 % IV SOLN
INTRAVENOUS | Status: DC
  Administered 2011-10-29: via INTRAVENOUS

## 2011-10-28 NOTE — ED Notes (Signed)
JXB:[id number]<GN> Expected date:10/28/11<BR> Expected time:10:26 PM<BR> Means of arrival:Ambulance<BR> Comments:<BR> EMS 10 GC - painful urination

## 2011-10-28 NOTE — ED Notes (Signed)
Pt brought in via ems and taken to room 2 and made comfortable. Pt is able to make needs known.

## 2011-10-28 NOTE — ED Provider Notes (Signed)
History     CSN: [id number]  Arrival date & time 10/28/11  2230   First MD Initiated Contact with Patient 10/28/11 2234      Chief Complaint  Patient presents with  . Dysuria    pt states started about 4 hours ago.   . Fever    started 2 hours ago.     (Consider location/radiation/quality/duration/timing/severity/associated sxs/prior treatment) HPI  Patient relates this afternoon he had acute onset of frequency, dysuria with burning or, and fever to 100. He states he's having to urinate every 2-3 minutes. He denies nausea or vomiting. He denies cough. He states he has chronic rhinorrhea. He has an appointment tomorrow afternoon to be seen by his doctor. He states he was last put on Cipro by his urologist about 30 days ago for a urinary tract  infection. Patient denies feeling he is having a flareup of his myasthenia gravis.  PCP Dr. Clent Ridges Neurologist t Dr. Anne Hahn  Past Medical History  Diagnosis Date  . Coronary artery disease   . Hypertension   . Diverticulitis   . GERD (gastroesophageal reflux disease)   . Benign prostatic hypertrophy   . Myasthenia gravis   . Partial small bowel obstruction 04/16/10    resolved with bowel rest    Past Surgical History  Procedure Date  . Total knee arthroplasty     both Sees Dr. Margreta Journey  . Nephrectomy     left  . Angioplasty     had 2 stent replacements  . Stented placed 1/08  . Colonoscopy 05/28/10    per Dr. Carman Ching, diverticulosis, no repeats planned     History reviewed. No pertinent family history.  History  Substance Use Topics  . Smoking status: Never Smoker   . Smokeless tobacco: Never Used  . Alcohol Use: No   Lives at home with spouse   Review of Systems  All other systems reviewed and are negative.    Allergies  Dicyclomine  Home Medications   Current Outpatient Rx  Name Route Sig Dispense Refill  . IPRATROPIUM-ALBUTEROL 18-103 MCG/ACT IN AERO Inhalation Inhale 2 puffs into the lungs every 6  (six) hours as needed.      . ASPIRIN 325 MG PO TABS Oral Take 325 mg by mouth daily.      Marland Kitchen BENZONATATE 200 MG PO CAPS Oral Take 200 mg by mouth 3 (three) times daily as needed.      Marland Kitchen CALCIUM CARBONATE-VITAMIN D 500-200 MG-UNIT PO TABS Oral Take 1 tablet by mouth daily.      Marland Kitchen FAMOTIDINE 20 MG PO TABS Oral Take 20 mg by mouth 2 (two) times daily.      Marland Kitchen HYDROCODONE-ACETAMINOPHEN 5-500 MG PO TABS Oral Take 1 tablet by mouth every 6 (six) hours as needed for pain. 120 tablet 5  . METOPROLOL SUCCINATE ER 25 MG PO TB24 Oral Take 25 mg by mouth at bedtime.      Marland Kitchen MYCOPHENOLATE MOFETIL 500 MG PO TABS Oral Take 500 mg by mouth daily.      Marland Kitchen POLYETHYLENE GLYCOL 3350 PO PACK Oral Take 17 g by mouth daily.      Marland Kitchen PROMETHAZINE HCL 25 MG PO TABS Oral Take 25 mg by mouth every 6 (six) hours as needed.      Marland Kitchen PYRIDOSTIGMINE BROMIDE 60 MG PO TABS Oral Take 60 mg by mouth daily.        BP 142/82  Pulse 96  Temp(Src) 99 F (37.2 C) (Oral)  Resp 20  Ht 5\' 9"  (1.753 m)  Wt 253 lb (114.76 kg)  BMI 37.36 kg/m2  SpO2 95%  Vital signs normal except mild low-grade fever   Physical Exam  Nursing note and vitals reviewed. Constitutional: He is oriented to person, place, and time. He appears well-developed and well-nourished.  Non-toxic appearance. He does not appear ill. No distress.  HENT:  Head: Normocephalic and atraumatic.  Right Ear: External ear normal.  Left Ear: External ear normal.  Nose: Nose normal. No mucosal edema or rhinorrhea.  Mouth/Throat: Mucous membranes are normal. No dental abscesses or uvula swelling.        mucous membranes dry  Eyes: Conjunctivae and EOM are normal. Pupils are equal, round, and reactive to light.  Neck: Normal range of motion and full passive range of motion without pain. Neck supple.  Cardiovascular: Normal rate, regular rhythm and normal heart sounds.  Exam reveals no gallop and no friction rub.   No murmur heard. Pulmonary/Chest: Effort normal and breath  sounds normal. No respiratory distress. He has no wheezes. He has no rhonchi. He has no rales. He exhibits no tenderness and no crepitus.  Abdominal: Soft. Normal appearance and bowel sounds are normal. He exhibits no distension. There is no tenderness. There is no rebound and no guarding.  Musculoskeletal: Normal range of motion. He exhibits no edema and no tenderness.       Moves all extremities well.   Neurological: He is alert and oriented to person, place, and time. He has normal strength. No cranial nerve deficit.  Skin: Skin is warm, dry and intact. No rash noted. No erythema. No pallor.  Psychiatric: His speech is normal and behavior is normal. His mood appears not anxious.       Affect is flat    ED Course  Procedures (including critical care time)   Pt had IV started, at end of shift his urine had just resulted. He received IV rocephin and will be dispositioned by Dr Hyacinth Meeker.  Results for orders placed during the hospital encounter of 10/28/11  CBC      Component Value Range   WBC 9.0  4.0 - 10.5 (K/uL)   RBC 4.57  4.22 - 5.81 (MIL/uL)   Hemoglobin 14.5  13.0 - 17.0 (g/dL)   HCT 62.1  30.8 - 65.7 (%)   MCV 93.9  78.0 - 100.0 (fL)   MCH 31.7  26.0 - 34.0 (pg)   MCHC 33.8  30.0 - 36.0 (g/dL)   RDW 84.6  96.2 - 95.2 (%)   Platelets 175  150 - 400 (K/uL)  DIFFERENTIAL      Component Value Range   Neutrophils Relative 77  43 - 77 (%)   Neutro Abs 6.9  1.7 - 7.7 (K/uL)   Lymphocytes Relative 11 (*) 12 - 46 (%)   Lymphs Abs 1.0  0.7 - 4.0 (K/uL)   Monocytes Relative 10  3 - 12 (%)   Monocytes Absolute 0.9  0.1 - 1.0 (K/uL)   Eosinophils Relative 1  0 - 5 (%)   Eosinophils Absolute 0.1  0.0 - 0.7 (K/uL)   Basophils Relative 0  0 - 1 (%)   Basophils Absolute 0.0  0.0 - 0.1 (K/uL)  COMPREHENSIVE METABOLIC PANEL      Component Value Range   Sodium 139  135 - 145 (mEq/L)   Potassium 4.1  3.5 - 5.1 (mEq/L)   Chloride 107  96 - 112 (mEq/L)   CO2 23  19 -  32 (mEq/L)   Glucose,  Bld 108 (*) 70 - 99 (mg/dL)   BUN 25 (*) 6 - 23 (mg/dL)   Creatinine, Ser 1.61  0.50 - 1.35 (mg/dL)   Calcium 9.6  8.4 - 09.6 (mg/dL)   Total Protein 6.9  6.0 - 8.3 (g/dL)   Albumin 3.4 (*) 3.5 - 5.2 (g/dL)   AST 32  0 - 37 (U/L)   ALT 34  0 - 53 (U/L)   Alkaline Phosphatase 44  39 - 117 (U/L)   Total Bilirubin 0.5  0.3 - 1.2 (mg/dL)   GFR calc non Af Amer 59 (*) >90 (mL/min)   GFR calc Af Amer 68 (*) >90 (mL/min)  URINALYSIS, ROUTINE W REFLEX MICROSCOPIC      Component Value Range   Color, Urine YELLOW  YELLOW    APPearance TURBID (*) CLEAR    Specific Gravity, Urine 1.023  1.005 - 1.030    pH 7.0  5.0 - 8.0    Glucose, UA NEGATIVE  NEGATIVE (mg/dL)   Hgb urine dipstick LARGE (*) NEGATIVE    Bilirubin Urine NEGATIVE  NEGATIVE    Ketones, ur NEGATIVE  NEGATIVE (mg/dL)   Protein, ur 045 (*) NEGATIVE (mg/dL)   Urobilinogen, UA 0.2  0.0 - 1.0 (mg/dL)   Nitrite NEGATIVE  NEGATIVE    Leukocytes, UA LARGE (*) NEGATIVE   URINE MICROSCOPIC-ADD ON      Component Value Range   WBC, UA TOO NUMEROUS TO COUNT  <3 (WBC/hpf)   RBC / HPF 11-20  <3 (RBC/hpf)   Bacteria, UA FEW (*) RARE     Diagnoses that have been ruled out:  None  Diagnoses that are still under consideration:  None  Final diagnoses:  UTI (lower urinary tract infection)    New Prescriptions   CEPHALEXIN (KEFLEX) 500 MG CAPSULE    Take 1 capsule (500 mg total) by mouth 4 (four) times daily.    Plan discharge per Dr Hyacinth Meeker      MDM          Ward Givens, MD 10/29/11 [phone]

## 2011-10-29 LAB — COMPREHENSIVE METABOLIC PANEL
AST: 32 U/L (ref 0–37)
Albumin: 3.4 g/dL — ABNORMAL LOW (ref 3.5–5.2)
Alkaline Phosphatase: 44 U/L (ref 39–117)
BUN: 25 mg/dL — ABNORMAL HIGH (ref 6–23)
CO2: 23 mEq/L (ref 19–32)
Chloride: 107 mEq/L (ref 96–112)
Creatinine, Ser: 1.11 mg/dL (ref 0.50–1.35)
GFR calc non Af Amer: 59 mL/min — ABNORMAL LOW (ref >90)
Potassium: 4.1 mEq/L (ref 3.5–5.1)
Total Bilirubin: 0.5 mg/dL (ref 0.3–1.2)

## 2011-10-29 LAB — URINALYSIS, ROUTINE W REFLEX MICROSCOPIC
Bilirubin Urine: NEGATIVE
Nitrite: NEGATIVE
Protein, ur: 100 mg/dL — AB
Urobilinogen, UA: 0.2 mg/dL (ref 0.0–1.0)

## 2011-10-29 LAB — URINE MICROSCOPIC-ADD ON

## 2011-10-29 MED ORDER — CEPHALEXIN 500 MG PO CAPS: 500.0000 mg | ORAL_CAPSULE | Freq: Four times a day (QID) | ORAL | Status: AC

## 2011-10-29 MED ORDER — DEXTROSE 5 % IV SOLN
1.0000 g | Freq: Once | INTRAVENOUS | Status: AC
  Administered 2011-10-29: 01:00:00 via INTRAVENOUS
  Filled 2011-10-29: qty 10

## 2011-10-29 NOTE — ED Notes (Signed)
Patient is resting comfortably. 

## 2011-10-29 NOTE — ED Notes (Signed)
Vital signs stable. 

## 2011-10-29 NOTE — ED Provider Notes (Signed)
  Physical Exam  BP 142/82  Pulse 96  Temp(Src) 99 F (37.2 C) (Oral)  Resp 20  Ht 5\' 9"  (1.753 m)  Wt 253 lb (114.76 kg)  BMI 37.36 kg/m2  SpO2 95%  Physical Exam  ED Course  Procedures  MDM Received in change of shift, has normal vital signs, normal white blood cell count, normal kidney function but has a urinalysis consistent with a urinary tract infection. I have reevaluated the patient who has no abdominal pain at this time. Rocephin IV has been given and the patient states that he does not have a ride home. Due to lack of transportation we'll place the patient in the transitional. For the rest of the evening and have family pickup in the morning.      Vida Roller, MD 10/29/11 [phone]

## 2011-10-29 NOTE — ED Notes (Signed)
Patient denies pain and is resting comfortably.  

## 2011-10-31 LAB — URINE CULTURE

## 2011-11-04 LAB — CULTURE, BLOOD (ROUTINE X 2)
Culture  Setup Time: 201301250328
Culture: NO GROWTH

## 2011-11-27 ENCOUNTER — Encounter (HOSPITAL_COMMUNITY): Payer: Self-pay

## 2011-11-27 ENCOUNTER — Emergency Department (INDEPENDENT_AMBULATORY_CARE_PROVIDER_SITE_OTHER)
Admission: EM | Admit: 2011-11-27 | Discharge: 2011-11-27 | Disposition: A | Discharge status: Home / Self Care | Payer: Medicare Other | Source: Home / Self Care | Attending: Family Medicine | Admitting: Family Medicine

## 2011-11-27 DIAGNOSIS — N390 Urinary tract infection, site not specified: Secondary | ICD-10-CM

## 2011-11-27 LAB — POCT URINALYSIS DIP (DEVICE)
Bilirubin Urine: NEGATIVE
Nitrite: POSITIVE — AB
pH: 7 (ref 5.0–8.0)

## 2011-11-27 MED ORDER — CIPROFLOXACIN HCL 500 MG PO TABS: 500.0000 mg | ORAL_TABLET | Freq: Two times a day (BID) | ORAL | Status: AC

## 2011-11-27 NOTE — ED Notes (Signed)
PT HAS BURNING AND FREQUENCY OF URINATION SINCE YESTERDAY, HE HAD UTI THREE WEEKS AGO.

## 2011-11-27 NOTE — Discharge Instructions (Signed)

## 2011-11-27 NOTE — ED Provider Notes (Signed)
History     CSN: [id number]  Arrival date & time 11/27/11  4098   None     Chief Complaint  Patient presents with  . Urinary Tract Infection    (Consider location/radiation/quality/duration/timing/severity/associated sxs/prior treatment) Patient is a 78 y.o. male presenting with dysuria. The history is provided by the patient. No language interpreter was used.  Dysuria  This is a new problem. The current episode started 12 to 24 hours ago. The problem occurs every urination. The problem has been gradually worsening. The pain is at a severity of 3/10. The pain is moderate. The maximum temperature recorded prior to his arrival was 101 to 101.9 F. The fever has been present for 5 days or more. There is no history of pyelonephritis. Associated symptoms include vomiting. He has tried antibiotics for the symptoms. His past medical history is significant for recurrent UTIs.  Pt is followed by urology for you recurrent UTIs. Patient reports he was treated in January for a urinary tract infection he followed up with urology. Patient has one kidney. He reports he lost a kidney to kidney cancer. Patient reports he has responded best to Cipro in the past and that this is what the urologist uses to treat him.  Past Medical History  Diagnosis Date  . Coronary artery disease   . Hypertension   . Diverticulitis   . GERD (gastroesophageal reflux disease)   . Benign prostatic hypertrophy   . Myasthenia gravis   . Partial small bowel obstruction 04/16/10    resolved with bowel rest    Past Surgical History  Procedure Date  . Total knee arthroplasty     both Sees Dr. Margreta Journey  . Nephrectomy     left  . Angioplasty     had 2 stent replacements  . Stented placed 1/08  . Colonoscopy 05/28/10    per Dr. Carman Ching, diverticulosis, no repeats planned     History reviewed. No pertinent family history.  History  Substance Use Topics  . Smoking status: Never Smoker   . Smokeless tobacco: Never  Used  . Alcohol Use: No      Review of Systems  Gastrointestinal: Positive for vomiting.  Genitourinary: Positive for dysuria.  All other systems reviewed and are negative.    Allergies  Dicyclomine  Home Medications   Current Outpatient Rx  Name Route Sig Dispense Refill  . IPRATROPIUM-ALBUTEROL 18-103 MCG/ACT IN AERO Inhalation Inhale 2 puffs into the lungs every 6 (six) hours as needed.      Marland Kitchen CALCIUM CARBONATE-VITAMIN D 500-200 MG-UNIT PO TABS Oral Take 1 tablet by mouth daily.      Marland Kitchen FAMOTIDINE 20 MG PO TABS Oral Take 20 mg by mouth 2 (two) times daily.      Marland Kitchen HYDROCODONE-ACETAMINOPHEN 5-500 MG PO TABS Oral Take 1 tablet by mouth every 6 (six) hours as needed for pain. 120 tablet 5  . METOPROLOL SUCCINATE ER 25 MG PO TB24 Oral Take 25 mg by mouth daily.     Marland Kitchen MYCOPHENOLATE MOFETIL 500 MG PO TABS Oral Take 500 mg by mouth daily.      Marland Kitchen PANTOPRAZOLE SODIUM 40 MG PO TBEC Oral Take 40 mg by mouth daily.    Marland Kitchen POLYETHYLENE GLYCOL 3350 PO PACK Oral Take 17 g by mouth daily.      Marland Kitchen PROMETHAZINE HCL 25 MG PO TABS Oral Take 25 mg by mouth every 6 (six) hours as needed. nausea    . PYRIDOSTIGMINE BROMIDE 60 MG PO TABS  Oral Take 60 mg by mouth daily.        BP 153/79  Pulse 85  Temp(Src) 97.6 F (36.4 C) (Oral)  Resp 19  SpO2 95%  Physical Exam  Nursing note and vitals reviewed. Constitutional: He is oriented to person, place, and time. He appears well-developed and well-nourished.  HENT:  Head: Normocephalic.  Eyes: Pupils are equal, round, and reactive to light.  Neck: Normal range of motion.  Cardiovascular: Normal rate.   Pulmonary/Chest: Effort normal.  Abdominal: Soft.  Musculoskeletal: Normal range of motion.  Neurological: He is alert and oriented to person, place, and time.  Skin: Skin is warm.  Psychiatric: He has a normal mood and affect.    ED Course  Procedures (including critical care time)  Labs Reviewed  POCT URINALYSIS DIP (DEVICE) - Abnormal;  Notable for the following:    Hgb urine dipstick LARGE (*)    Protein, ur >=300 (*)    Nitrite POSITIVE (*)    Leukocytes, UA LARGE (*) Biochemical Testing Only. Please order routine urinalysis from main lab if confirmatory testing is needed.   All other components within normal limits   No results found.   No diagnosis found.    MDM  Last urine culture reviewed was positive for Klebsiella. Today's urinalysis shows large leukocytes. I will obtain a culture patient is given prescription for Cipro #14 one tablet by mouth twice a day he is advised to call the urology office for appointment next week for recheck he is to return if symptoms worsen or change.       Langston Masker, [place] 11/27/11 1108  [place], [place] 11/27/11 1323  [place], [place] 11/27/11 1324  [place], [place] 11/27/11 1325  [place], [place] 11/27/11 1326  [place], [place] 11/27/11 1328  [place], [place] 11/29/11 1103  [place], [place] 11/29/11 1116  [place], [place] 11/29/11 [phone]

## 2011-11-29 LAB — URINE CULTURE
Colony Count: >=100000
Culture  Setup Time: 201302231849

## 2011-12-02 NOTE — ED Notes (Signed)
Urine culture: >100,000 colonies Klebsiella Pneumoniae. Pt. adequately treated with Cipro. [name] 12/02/2011

## 2011-12-02 NOTE — ED Provider Notes (Signed)
Medical screening examination/treatment/procedure(s) were performed by non-physician practitioner and as supervising physician I was immediately available for consultation/collaboration.  Amariya Liskey G  D.O.    Randa Spike, MD 12/02/11 [phone]

## 2012-02-21 ENCOUNTER — Other Ambulatory Visit: Payer: Self-pay | Admitting: Orthopaedic Surgery

## 2012-03-14 ENCOUNTER — Telehealth: Payer: Self-pay | Admitting: Family Medicine

## 2012-03-14 NOTE — Telephone Encounter (Signed)
Refill request for Vicodin 5/500 mg take 1 po q6hrs prn and pt last here on 08/19/11.

## 2012-03-15 ENCOUNTER — Encounter (HOSPITAL_COMMUNITY): Payer: Self-pay | Admitting: Pharmacy Technician

## 2012-03-15 MED ORDER — HYDROCODONE-ACETAMINOPHEN 5-500 MG PO TABS: 1.0000 | ORAL_TABLET | Freq: Four times a day (QID) | ORAL | Status: DC | PRN

## 2012-03-15 NOTE — Telephone Encounter (Signed)
Call in #[phone] with 5 rf 

## 2012-03-15 NOTE — Telephone Encounter (Signed)
Pt is waiting on refill °

## 2012-03-15 NOTE — Telephone Encounter (Signed)
I called in script 

## 2012-03-21 ENCOUNTER — Encounter (HOSPITAL_COMMUNITY)
Admission: RE | Admit: 2012-03-21 | Discharge: 2012-03-21 | Disposition: A | Discharge status: Home / Self Care | Payer: Medicare Other | Source: Ambulatory Visit | Attending: Orthopaedic Surgery | Admitting: Orthopaedic Surgery

## 2012-03-21 ENCOUNTER — Encounter (HOSPITAL_COMMUNITY): Payer: Self-pay

## 2012-03-21 HISTORY — DX: Chronic kidney disease, unspecified: N18.9

## 2012-03-21 HISTORY — DX: Shortness of breath: R06.02

## 2012-03-21 HISTORY — DX: Unspecified osteoarthritis, unspecified site: M19.90

## 2012-03-21 HISTORY — DX: Other pulmonary embolism without acute cor pulmonale: I26.99

## 2012-03-21 HISTORY — DX: Malignant (primary) neoplasm, unspecified: C80.1

## 2012-03-21 LAB — TYPE AND SCREEN: Antibody Screen: NEGATIVE

## 2012-03-21 LAB — BASIC METABOLIC PANEL
BUN: 19 mg/dL (ref 6–23)
CO2: 24 mEq/L (ref 19–32)
Chloride: 105 mEq/L (ref 96–112)
Creatinine, Ser: 1.09 mg/dL (ref 0.50–1.35)

## 2012-03-21 LAB — DIFFERENTIAL
Basophils Absolute: 0 10*3/uL (ref 0.0–0.1)
Eosinophils Relative: 5 % (ref 0–5)
Lymphocytes Relative: 24 % (ref 12–46)
Monocytes Absolute: 0.5 10*3/uL (ref 0.1–1.0)

## 2012-03-21 LAB — URINALYSIS, ROUTINE W REFLEX MICROSCOPIC
Hgb urine dipstick: NEGATIVE
Nitrite: NEGATIVE
Specific Gravity, Urine: 1.023 (ref 1.005–1.030)
Urobilinogen, UA: 0.2 mg/dL (ref 0.0–1.0)

## 2012-03-21 LAB — SURGICAL PCR SCREEN
MRSA, PCR: NEGATIVE
Staphylococcus aureus: NEGATIVE

## 2012-03-21 LAB — CBC
HCT: 41.9 % (ref 39.0–52.0)
MCHC: 33.4 g/dL (ref 30.0–36.0)
MCV: 93.5 fL (ref 78.0–100.0)
RDW: 13.9 % (ref 11.5–15.5)
WBC: 5 10*3/uL (ref 4.0–10.5)

## 2012-03-21 NOTE — Progress Notes (Signed)
Call to [facility], spoke with [facility], request last ov visit & ekg & stress test.

## 2012-03-21 NOTE — Pre-Procedure Instructions (Signed)
20 [name]  03/21/2012   Your procedure is scheduled on:  03/28/2012  Report to [facility] at 8:15 AM.  Call this number if you have problems the morning of surgery: [phone]   Remember:   Do not eat food or drink:After Midnight.      Take these medicines the morning of surgery with A SIP OF WATER: metoprolol, protonix,  Cellcept, mestinon   Do not wear jewelry, make-up or nail polish.  Do not wear lotions, powders, or perfumes. You may wear deodorant.  Do not shave 48 hours prior to surgery. Men may shave face and neck.  Do not bring valuables to the hospital.  Contacts, dentures or bridgework may not be worn into surgery.  Leave suitcase in the car. After surgery it may be brought to your room.  For patients admitted to the hospital, checkout time is 11:00 AM the day of discharge.   Patients discharged the day of surgery will not be allowed to drive home.  Name and phone number of your driver: with sister  Special Instructions: CHG Shower Use Special Wash: 1/2 bottle night before surgery and 1/2 bottle morning of surgery.   Please read over the following fact sheets that you were given: Pain Booklet, Coughing and Deep Breathing, Blood Transfusion Information, MRSA Information and Surgical Site Infection Prevention

## 2012-03-21 NOTE — Progress Notes (Signed)
Left message for Dr. Jerl Santos, Agustin Cree, for determination of plavix in view of surgery date of 03/28/2012.

## 2012-03-21 NOTE — Progress Notes (Signed)
Call to Dr,. Massegee for guidance on pt.'s meds for day of surg. Pt. Instructed to take cellcept, mestinon , toprol & protonix dos.

## 2012-03-22 ENCOUNTER — Encounter (HOSPITAL_COMMUNITY): Payer: Self-pay | Admitting: Vascular Surgery

## 2012-03-22 NOTE — H&P (Signed)
[name] is an 78 y.o. male.   Chief Complaint: right total knee replacement with pain HPI: this patient has had a knee replacement done on the right side in 2005 and is having tibial component pain.a bone scan from 2010 is very suggestive of tibial component loosening.  Recent aspiration shows 1000 WBCs.  At this point he is having pain when he ambulates and some trouble sleeping at nighttime.  We have discussed proceeding with revision of the tibial component to reduce his pain and improve function.  Past Medical History  Diagnosis Date  . Coronary artery disease   . Diverticulitis   . GERD (gastroesophageal reflux disease)   . Benign prostatic hypertrophy   . Myasthenia gravis   . Partial small bowel obstruction 04/16/10    resolved with bowel rest  . Hypertension     Dr. Alanda Amass- within a month or 2 to clear for surgery   . Shortness of breath   . Pulmonary embolus     2000  . Arthritis     back, hips, knees   . Cancer     h/o renal carcinoma   . Chronic kidney disease     h/o UTI-2012  . Myasthenia gravis     Dr. Anne Hahn    Past Surgical History  Procedure Date  . Total knee arthroplasty     both Sees Dr. Margreta Journey  . Nephrectomy     left  . Angioplasty     had 2 stent replacements  . Stented placed 1/08  . Colonoscopy 05/28/10    per Dr. Carman Ching, diverticulosis, no repeats planned   . Sp kyphoplasty     and vertebroplasty  . Cardiac catheterization     2008, stents   . Eye surgery     cataracts(bilateral)  removed, ?iol  . Tonsillectomy   . Joint replacement     2005-R, L knee replacement- 2000    No family history on file. Social History:  reports that he quit smoking about 53 years ago. He has never used smokeless tobacco. He reports that he does not drink alcohol or use illicit drugs.  Allergies:  Allergies  Allergen Reactions  . Dicyclomine Other (See Comments)    REACTION: can worsen myasthenia gravis    No prescriptions prior to  admission    Results for orders placed during the hospital encounter of 03/21/12 (from the past 48 hour(s))  URINALYSIS, ROUTINE W REFLEX MICROSCOPIC     Status: Abnormal   Collection Time   03/21/12 11:05 AM      Component Value Range Comment   Color, Urine YELLOW  YELLOW    APPearance CLEAR  CLEAR    Specific Gravity, Urine 1.023  1.005 - 1.030    pH 6.0  5.0 - 8.0    Glucose, UA NEGATIVE  NEGATIVE mg/dL    Hgb urine dipstick NEGATIVE  NEGATIVE    Bilirubin Urine NEGATIVE  NEGATIVE    Ketones, ur NEGATIVE  NEGATIVE mg/dL    Protein, ur NEGATIVE  NEGATIVE mg/dL    Urobilinogen, UA 0.2  0.0 - 1.0 mg/dL    Nitrite NEGATIVE  NEGATIVE    Leukocytes, UA TRACE (*) NEGATIVE   URINE MICROSCOPIC-ADD ON     Status: Normal   Collection Time   03/21/12 11:05 AM      Component Value Range Comment   Squamous Epithelial / LPF RARE  RARE    WBC, UA 0-2  <3 WBC/hpf   SURGICAL PCR  SCREEN     Status: Normal   Collection Time   03/21/12 11:06 AM      Component Value Range Comment   MRSA, PCR NEGATIVE  NEGATIVE    Staphylococcus aureus NEGATIVE  NEGATIVE   APTT     Status: Normal   Collection Time   03/21/12 11:13 AM      Component Value Range Comment   aPTT 35  24 - 37 seconds   BASIC METABOLIC PANEL     Status: Abnormal   Collection Time   03/21/12 11:13 AM      Component Value Range Comment   Sodium 139  135 - 145 mEq/L    Potassium 4.2  3.5 - 5.1 mEq/L    Chloride 105  96 - 112 mEq/L    CO2 24  19 - 32 mEq/L    Glucose, Bld 93  70 - 99 mg/dL    BUN 19  6 - 23 mg/dL    Creatinine, Ser 9.56  0.50 - 1.35 mg/dL    Calcium 9.5  8.4 - 21.3 mg/dL    GFR calc non Af Amer 60 (*) >90 mL/min    GFR calc Af Amer 70 (*) >90 mL/min   CBC     Status: Normal   Collection Time   03/21/12 11:13 AM      Component Value Range Comment   WBC 5.0  4.0 - 10.5 K/uL    RBC 4.48  4.22 - 5.81 MIL/uL    Hemoglobin 14.0  13.0 - 17.0 g/dL    HCT 08.6  57.8 - 46.9 %    MCV 93.5  78.0 - 100.0 fL    MCH 31.3   26.0 - 34.0 pg    MCHC 33.4  30.0 - 36.0 g/dL    RDW 62.9  52.8 - 41.3 %    Platelets 180  150 - 400 K/uL   DIFFERENTIAL     Status: Normal   Collection Time   03/21/12 11:13 AM      Component Value Range Comment   Neutrophils Relative 61  43 - 77 %    Neutro Abs 3.0  1.7 - 7.7 K/uL    Lymphocytes Relative 24  12 - 46 %    Lymphs Abs 1.2  0.7 - 4.0 K/uL    Monocytes Relative 10  3 - 12 %    Monocytes Absolute 0.5  0.1 - 1.0 K/uL    Eosinophils Relative 5  0 - 5 %    Eosinophils Absolute 0.3  0.0 - 0.7 K/uL    Basophils Relative 0  0 - 1 %    Basophils Absolute 0.0  0.0 - 0.1 K/uL   PROTIME-INR     Status: Normal   Collection Time   03/21/12 11:13 AM      Component Value Range Comment   Prothrombin Time 15.1  11.6 - 15.2 seconds    INR 1.17  0.00 - 1.49   TYPE AND SCREEN     Status: Normal   Collection Time   03/21/12 11:15 AM      Component Value Range Comment   ABO/RH(D) A POS      Antibody Screen NEG      Sample Expiration 04/04/2012      Dg Chest 2 View  03/21/2012  *RADIOLOGY REPORT*  Clinical Data: Right knee replacement  CHEST - 2 VIEW  Comparison: 09/24/2009  Findings: Upper normal heart size. Calcified tortuous aorta. Pulmonary vascularity normal. Minimal peribronchial  thickening and bibasilar atelectasis. Lungs otherwise clear. No pleural effusion or pneumothorax. Multiple thoracic compression deformities with prior vertebroplasties at question three levels.  IMPRESSION: Minimal bronchitic changes and bibasilar atelectasis.  Original Report Authenticated By: Lollie Marrow, M.D.    Review of Systems  Constitutional: Negative.   HENT: Negative.   Eyes: Negative.   Respiratory: Negative.   Cardiovascular: Negative.   Gastrointestinal: Negative.   Genitourinary: Negative.   Musculoskeletal: Negative.   Skin: Negative.   Neurological: Negative.   Endo/Heme/Allergies: Negative.   Psychiatric/Behavioral: Negative.     There were no vitals taken for this  visit. Physical Exam  Constitutional: He appears well-nourished.  HENT:  Head: Atraumatic.  Eyes: Pupils are equal, round, and reactive to light.  Neck: Normal range of motion.  Cardiovascular: Normal rate.   Respiratory: Breath sounds normal.  GI: Bowel sounds are normal.  Musculoskeletal:       Right knee exam: Range of motion 0-105 no effusion.  Walks with a slight altered gait.  Some pain in the proximal tibial region to direct palpation.  There is no redness or warmth.  Neurological: He is alert.  Skin: Skin is warm.  Psychiatric: He has a normal mood and affect.     Assessment/Plan A: Painful right total knee replacement in 2005 with probable loose tibial component P: We have discussed with this patient proceeding with a right knee revision and the risks of anesthesia, infection, DVT and possible death associated with this procedure.  We have also discussed the length of stay in the hospital and need for physical therapy.  Hopefully the procedure will reduce his pain and improve function.  Kylee Umana R 03/22/2012, 5:39 PM

## 2012-03-22 NOTE — Consult Note (Signed)
Anesthesia Chart Review:  Patient is a 78 year old male scheduled for a right TK revision on 03/28/12 by Dr. Jerl Santos.  History includes CAD s/p non-DES mid LAD '02 with chronically occluded mid CX, DES RCA '08, obesity with BMI 41.7, HTN, HLD, left nephrectomy for renal cancer and UTI (Dr. Vernie Ammons), Myasthenia Gravis on chronic Mestinon, prednisone and Cellcept (Dr. Anne Hahn), GERD, BPH, PE '00, chronic SOB, diverticulitis, partial SBO treated non-surgically '11, prior bilateral TKR.  His Cardiologist is Dr. Alanda Amass [facility]).  He was last seen on 01/25/12, and by notes, appeared stable from a cardiac standpoint and "does not need any cardiac testing at present." EKG then showed NSR with PACs, minor nonspecific ST changes that were felt unchanged by Dr. Alanda Amass.  His last stress test was on 02/24/11 and showed moderate area of inferobasal scar without ischemia.  It was not felt significantly changed from prior study, and overall felt to be a low risk scan.    Echo on 02/24/11 showed mild LVH, EF > 55%, impaired LV relaxation, mild LA dilatation, mild mitral annular calcification, mild TR, RV systolic pressure elevated at 16-10 mmHg, AV mildly sclerotic, aortic root sclerosis/calcification.  CXR on 03/21/12 showed minimal bronchitic changes and bibasilar atelectasis.  Labs noted.    Anticipate he can proceed if no significant change in his status.  Of note, Agustin Cree at [employer] reported that Dr. Jerl Santos did not feel patient had to stop his Plavix pre-operatively.  Shonna Chock, PA-C

## 2012-03-27 MED ORDER — CEFAZOLIN SODIUM-DEXTROSE 2-3 GM-% IV SOLR
2.0000 g | INTRAVENOUS | Status: AC
  Administered 2012-03-28: 2 g via INTRAVENOUS

## 2012-03-28 ENCOUNTER — Encounter (HOSPITAL_COMMUNITY)
Admission: RE | Disposition: A | Discharge status: Skilled Nursing Facility | Payer: Self-pay | Source: Ambulatory Visit | Attending: Orthopaedic Surgery

## 2012-03-28 ENCOUNTER — Encounter (HOSPITAL_COMMUNITY): Payer: Self-pay | Admitting: Vascular Surgery

## 2012-03-28 ENCOUNTER — Inpatient Hospital Stay (HOSPITAL_COMMUNITY)
Admission: RE | Admit: 2012-03-28 | Discharge: 2012-03-31 | DRG: 468 | Disposition: A | Discharge status: Skilled Nursing Facility | Payer: Medicare Other | Source: Ambulatory Visit | Attending: Orthopaedic Surgery | Admitting: Orthopaedic Surgery

## 2012-03-28 ENCOUNTER — Encounter (HOSPITAL_COMMUNITY): Payer: Self-pay | Admitting: Internal Medicine

## 2012-03-28 ENCOUNTER — Encounter (HOSPITAL_COMMUNITY): Payer: Self-pay | Admitting: *Deleted

## 2012-03-28 ENCOUNTER — Ambulatory Visit (HOSPITAL_COMMUNITY): Payer: Medicare Other | Admitting: Vascular Surgery

## 2012-03-28 DIAGNOSIS — Z85528 Personal history of other malignant neoplasm of kidney: Secondary | ICD-10-CM

## 2012-03-28 DIAGNOSIS — N400 Enlarged prostate without lower urinary tract symptoms: Secondary | ICD-10-CM | POA: Diagnosis present

## 2012-03-28 DIAGNOSIS — Z79899 Other long term (current) drug therapy: Secondary | ICD-10-CM

## 2012-03-28 DIAGNOSIS — Z87891 Personal history of nicotine dependence: Secondary | ICD-10-CM

## 2012-03-28 DIAGNOSIS — I10 Essential (primary) hypertension: Secondary | ICD-10-CM | POA: Diagnosis present

## 2012-03-28 DIAGNOSIS — Z01812 Encounter for preprocedural laboratory examination: Secondary | ICD-10-CM

## 2012-03-28 DIAGNOSIS — G7000 Myasthenia gravis without (acute) exacerbation: Secondary | ICD-10-CM | POA: Diagnosis present

## 2012-03-28 DIAGNOSIS — K219 Gastro-esophageal reflux disease without esophagitis: Secondary | ICD-10-CM

## 2012-03-28 DIAGNOSIS — Z9861 Coronary angioplasty status: Secondary | ICD-10-CM

## 2012-03-28 DIAGNOSIS — I129 Hypertensive chronic kidney disease with stage 1 through stage 4 chronic kidney disease, or unspecified chronic kidney disease: Secondary | ICD-10-CM | POA: Diagnosis present

## 2012-03-28 DIAGNOSIS — T84012A Broken internal right knee prosthesis, initial encounter: Secondary | ICD-10-CM

## 2012-03-28 DIAGNOSIS — I2510 Atherosclerotic heart disease of native coronary artery without angina pectoris: Secondary | ICD-10-CM | POA: Diagnosis present

## 2012-03-28 DIAGNOSIS — Z86711 Personal history of pulmonary embolism: Secondary | ICD-10-CM

## 2012-03-28 DIAGNOSIS — Z7902 Long term (current) use of antithrombotics/antiplatelets: Secondary | ICD-10-CM

## 2012-03-28 DIAGNOSIS — T84039A Mechanical loosening of unspecified internal prosthetic joint, initial encounter: Principal | ICD-10-CM | POA: Diagnosis present

## 2012-03-28 DIAGNOSIS — IMO0002 Reserved for concepts with insufficient information to code with codable children: Secondary | ICD-10-CM

## 2012-03-28 DIAGNOSIS — N189 Chronic kidney disease, unspecified: Secondary | ICD-10-CM | POA: Diagnosis present

## 2012-03-28 DIAGNOSIS — Z96659 Presence of unspecified artificial knee joint: Secondary | ICD-10-CM

## 2012-03-28 DIAGNOSIS — Z01818 Encounter for other preprocedural examination: Secondary | ICD-10-CM

## 2012-03-28 DIAGNOSIS — Y831 Surgical operation with implant of artificial internal device as the cause of abnormal reaction of the patient, or of later complication, without mention of misadventure at the time of the procedure: Secondary | ICD-10-CM | POA: Diagnosis present

## 2012-03-28 HISTORY — PX: TOTAL KNEE REVISION: SHX996

## 2012-03-28 SURGERY — TOTAL KNEE REVISION
Anesthesia: General | Site: Knee | Laterality: Right | Wound class: Clean

## 2012-03-28 MED ORDER — MORPHINE SULFATE 2 MG/ML IJ SOLN
2.0000 mg | INTRAMUSCULAR | Status: DC | PRN
  Administered 2012-03-28 – 2012-03-29 (×2): 2 mg via INTRAVENOUS
  Filled 2012-03-28 (×2): qty 1

## 2012-03-28 MED ORDER — CALCIUM CARBONATE-VITAMIN D 500-200 MG-UNIT PO TABS
1.0000 | ORAL_TABLET | Freq: Four times a day (QID) | ORAL | Status: DC
  Administered 2012-03-28 (×2): 1 via ORAL
  Filled 2012-03-28 (×2): qty 1

## 2012-03-28 MED ORDER — ACETAMINOPHEN 10 MG/ML IV SOLN
INTRAVENOUS | Status: AC
  Filled 2012-03-28: qty 100

## 2012-03-28 MED ORDER — LACTATED RINGERS IV SOLN
INTRAVENOUS | Status: DC
  Administered 2012-03-28: 10:00:00 via INTRAVENOUS

## 2012-03-28 MED ORDER — ATORVASTATIN CALCIUM 10 MG PO TABS
10.0000 mg | ORAL_TABLET | Freq: Every day | ORAL | Status: DC
  Administered 2012-03-29 – 2012-03-30 (×2): 10 mg via ORAL
  Filled 2012-03-28 (×3): qty 1

## 2012-03-28 MED ORDER — PREDNISONE 2.5 MG PO TABS
7.5000 mg | ORAL_TABLET | ORAL | Status: DC
Start: 2012-04-13 — End: 2012-03-31

## 2012-03-28 MED ORDER — METOCLOPRAMIDE HCL 10 MG PO TABS: 5.0000 mg | ORAL_TABLET | Freq: Three times a day (TID) | ORAL | Status: DC | PRN

## 2012-03-28 MED ORDER — EPHEDRINE SULFATE 50 MG/ML IJ SOLN
INTRAMUSCULAR | Status: DC | PRN
  Administered 2012-03-28 (×2): 10 mg via INTRAVENOUS

## 2012-03-28 MED ORDER — OMEGA-3-ACID ETHYL ESTERS 1 G PO CAPS
2.0000 g | ORAL_CAPSULE | Freq: Every day | ORAL | Status: DC
  Administered 2012-03-29 – 2012-03-31 (×3): 2 g via ORAL
  Filled 2012-03-28 (×3): qty 2

## 2012-03-28 MED ORDER — PROPOFOL 10 MG/ML IV EMUL
INTRAVENOUS | Status: DC | PRN
  Administered 2012-03-28: 200 mg via INTRAVENOUS

## 2012-03-28 MED ORDER — MIDAZOLAM HCL 2 MG/2ML IJ SOLN: 0.5000 mg | Freq: Once | INTRAMUSCULAR | Status: DC | PRN

## 2012-03-28 MED ORDER — METOPROLOL SUCCINATE ER 50 MG PO TB24
50.0000 mg | ORAL_TABLET | Freq: Every day | ORAL | Status: DC
  Administered 2012-03-29 – 2012-03-31 (×3): 50 mg via ORAL
  Filled 2012-03-28 (×5): qty 1

## 2012-03-28 MED ORDER — ONDANSETRON HCL 4 MG/2ML IJ SOLN
INTRAMUSCULAR | Status: DC | PRN
  Administered 2012-03-28: 4 mg via INTRAVENOUS

## 2012-03-28 MED ORDER — ACETAMINOPHEN 10 MG/ML IV SOLN
INTRAVENOUS | Status: DC | PRN
  Administered 2012-03-28: 1000 mg via INTRAVENOUS

## 2012-03-28 MED ORDER — PROMETHAZINE HCL 25 MG PO TABS: 25.0000 mg | ORAL_TABLET | Freq: Four times a day (QID) | ORAL | Status: DC | PRN

## 2012-03-28 MED ORDER — PREDNISONE 5 MG PO TABS: 7.5000 mg | ORAL_TABLET | Freq: Every day | ORAL | Status: DC

## 2012-03-28 MED ORDER — MIDAZOLAM HCL 2 MG/2ML IJ SOLN
1.0000 mg | INTRAMUSCULAR | Status: DC | PRN
  Administered 2012-03-28: 0.5 mg via INTRAVENOUS

## 2012-03-28 MED ORDER — SODIUM CHLORIDE 0.9 % IR SOLN
Status: DC | PRN
  Administered 2012-03-28: 1

## 2012-03-28 MED ORDER — MIDAZOLAM HCL 2 MG/2ML IJ SOLN
INTRAMUSCULAR | Status: AC
  Filled 2012-03-28: qty 2

## 2012-03-28 MED ORDER — MYCOPHENOLATE MOFETIL 250 MG PO CAPS
500.0000 mg | ORAL_CAPSULE | Freq: Every day | ORAL | Status: DC
  Administered 2012-03-29 – 2012-03-31 (×3): 500 mg via ORAL
  Filled 2012-03-28 (×3): qty 2

## 2012-03-28 MED ORDER — ASPIRIN EC 325 MG PO TBEC
325.0000 mg | DELAYED_RELEASE_TABLET | Freq: Two times a day (BID) | ORAL | Status: DC
  Administered 2012-03-28 – 2012-03-31 (×6): 325 mg via ORAL
  Filled 2012-03-28 (×9): qty 1

## 2012-03-28 MED ORDER — LACTATED RINGERS IV SOLN
INTRAVENOUS | Status: DC | PRN
  Administered 2012-03-28 (×2): via INTRAVENOUS

## 2012-03-28 MED ORDER — PREDNISONE 20 MG PO TABS
40.0000 mg | ORAL_TABLET | Freq: Two times a day (BID) | ORAL | Status: DC
  Administered 2012-03-29 – 2012-03-31 (×5): 40 mg via ORAL
  Filled 2012-03-28 (×7): qty 2

## 2012-03-28 MED ORDER — ACETAMINOPHEN 325 MG PO TABS: 650.0000 mg | ORAL_TABLET | Freq: Four times a day (QID) | ORAL | Status: DC | PRN

## 2012-03-28 MED ORDER — CEFUROXIME SODIUM 1.5 G IJ SOLR
INTRAMUSCULAR | Status: DC | PRN
  Administered 2012-03-28: 1.5 g

## 2012-03-28 MED ORDER — MENTHOL 3 MG MT LOZG: 1.0000 | LOZENGE | OROMUCOSAL | Status: DC | PRN

## 2012-03-28 MED ORDER — PANTOPRAZOLE SODIUM 40 MG PO TBEC: 40.0000 mg | DELAYED_RELEASE_TABLET | Freq: Two times a day (BID) | ORAL | Status: DC | PRN

## 2012-03-28 MED ORDER — ONDANSETRON HCL 4 MG/2ML IJ SOLN: 4.0000 mg | Freq: Four times a day (QID) | INTRAMUSCULAR | Status: DC | PRN

## 2012-03-28 MED ORDER — PREDNISONE 5 MG PO TABS: 7.5000 mg | ORAL_TABLET | ORAL | Status: DC

## 2012-03-28 MED ORDER — PROMETHAZINE HCL 25 MG/ML IJ SOLN: 6.2500 mg | INTRAMUSCULAR | Status: DC | PRN

## 2012-03-28 MED ORDER — POLYETHYLENE GLYCOL 3350 17 G PO PACK: 17.0000 g | PACK | Freq: Every day | ORAL | Status: DC | PRN

## 2012-03-28 MED ORDER — METOCLOPRAMIDE HCL 5 MG/ML IJ SOLN: 5.0000 mg | Freq: Three times a day (TID) | INTRAMUSCULAR | Status: DC | PRN

## 2012-03-28 MED ORDER — PREDNISONE 10 MG PO TABS: 10.0000 mg | ORAL_TABLET | Freq: Every day | ORAL | Status: DC

## 2012-03-28 MED ORDER — ZOLPIDEM TARTRATE 5 MG PO TABS: 5.0000 mg | ORAL_TABLET | Freq: Every evening | ORAL | Status: DC | PRN

## 2012-03-28 MED ORDER — PHENOL 1.4 % MT LIQD
1.0000 | OROMUCOSAL | Status: DC | PRN
  Filled 2012-03-28: qty 177

## 2012-03-28 MED ORDER — CHLORHEXIDINE GLUCONATE 4 % EX LIQD: 60.0000 mL | Freq: Once | CUTANEOUS | Status: DC

## 2012-03-28 MED ORDER — PREDNISONE 20 MG PO TABS: 20.0000 mg | ORAL_TABLET | Freq: Every day | ORAL | Status: DC

## 2012-03-28 MED ORDER — ACETAMINOPHEN 650 MG RE SUPP: 650.0000 mg | Freq: Four times a day (QID) | RECTAL | Status: DC | PRN

## 2012-03-28 MED ORDER — MEPERIDINE HCL 25 MG/ML IJ SOLN: 6.2500 mg | INTRAMUSCULAR | Status: DC | PRN

## 2012-03-28 MED ORDER — FENTANYL CITRATE 0.05 MG/ML IJ SOLN
50.0000 ug | INTRAMUSCULAR | Status: DC | PRN
  Administered 2012-03-28: 50 ug via INTRAVENOUS

## 2012-03-28 MED ORDER — FENTANYL CITRATE 0.05 MG/ML IJ SOLN
INTRAMUSCULAR | Status: AC
  Filled 2012-03-28: qty 2

## 2012-03-28 MED ORDER — LIDOCAINE HCL (CARDIAC) 20 MG/ML IV SOLN
INTRAVENOUS | Status: DC | PRN
  Administered 2012-03-28: 60 mg via INTRAVENOUS

## 2012-03-28 MED ORDER — CLOPIDOGREL BISULFATE 75 MG PO TABS
75.0000 mg | ORAL_TABLET | Freq: Every day | ORAL | Status: DC
  Administered 2012-03-29 – 2012-03-31 (×3): 75 mg via ORAL
  Filled 2012-03-28 (×4): qty 1

## 2012-03-28 MED ORDER — HYDROMORPHONE HCL PF 1 MG/ML IJ SOLN: 0.2500 mg | INTRAMUSCULAR | Status: DC | PRN

## 2012-03-28 MED ORDER — CEFAZOLIN SODIUM-DEXTROSE 2-3 GM-% IV SOLR
INTRAVENOUS | Status: AC
  Administered 2012-03-29: 2 g via INTRAVENOUS
  Filled 2012-03-28: qty 50

## 2012-03-28 MED ORDER — CEFAZOLIN SODIUM-DEXTROSE 2-3 GM-% IV SOLR
2.0000 g | Freq: Four times a day (QID) | INTRAVENOUS | Status: AC
  Administered 2012-03-28 – 2012-03-29 (×2): 2 g via INTRAVENOUS
  Filled 2012-03-28 (×3): qty 50

## 2012-03-28 MED ORDER — PYRIDOSTIGMINE BROMIDE 60 MG PO TABS
60.0000 mg | ORAL_TABLET | Freq: Four times a day (QID) | ORAL | Status: DC
  Administered 2012-03-28 – 2012-03-31 (×11): 60 mg via ORAL
  Filled 2012-03-28 (×14): qty 1

## 2012-03-28 MED ORDER — PREDNISONE 20 MG PO TABS
40.0000 mg | ORAL_TABLET | Freq: Every day | ORAL | Status: DC
  Filled 2012-03-28: qty 2

## 2012-03-28 MED ORDER — BUPIVACAINE-EPINEPHRINE PF 0.5-1:200000 % IJ SOLN
INTRAMUSCULAR | Status: DC | PRN
  Administered 2012-03-28: 30 mL

## 2012-03-28 MED ORDER — MYCOPHENOLATE MOFETIL 500 MG PO TABS: 500.0000 mg | ORAL_TABLET | Freq: Every day | ORAL | Status: DC

## 2012-03-28 MED ORDER — DEXTROSE IN LACTATED RINGERS 5 % IV SOLN
INTRAVENOUS | Status: DC
  Administered 2012-03-28 – 2012-03-29 (×2): via INTRAVENOUS

## 2012-03-28 MED ORDER — ONDANSETRON HCL 4 MG PO TABS: 4.0000 mg | ORAL_TABLET | Freq: Four times a day (QID) | ORAL | Status: DC | PRN

## 2012-03-28 MED ORDER — ADULT MULTIVITAMIN W/MINERALS CH
1.0000 | ORAL_TABLET | Freq: Every day | ORAL | Status: DC
  Administered 2012-03-28 – 2012-03-31 (×4): 1 via ORAL
  Filled 2012-03-28 (×4): qty 1

## 2012-03-28 MED ORDER — CEFUROXIME SODIUM 1.5 G IJ SOLR
INTRAMUSCULAR | Status: AC
  Filled 2012-03-28: qty 1.5

## 2012-03-28 MED ORDER — FENTANYL CITRATE 0.05 MG/ML IJ SOLN
INTRAMUSCULAR | Status: DC | PRN
  Administered 2012-03-28 (×2): 50 ug via INTRAVENOUS
  Administered 2012-03-28: 100 ug via INTRAVENOUS

## 2012-03-28 MED ORDER — CALCIUM CARBONATE-VITAMIN D 500-200 MG-UNIT PO TABS
1.0000 | ORAL_TABLET | Freq: Three times a day (TID) | ORAL | Status: DC
  Administered 2012-03-29 – 2012-03-31 (×7): 1 via ORAL
  Filled 2012-03-28 (×9): qty 1

## 2012-03-28 MED ORDER — HYDROCODONE-ACETAMINOPHEN 5-325 MG PO TABS
1.0000 | ORAL_TABLET | ORAL | Status: DC | PRN
  Administered 2012-03-28 – 2012-03-31 (×7): 2 via ORAL
  Filled 2012-03-28 (×8): qty 2

## 2012-03-28 SURGICAL SUPPLY — 64 items
AUTOTRANSFUSION W/QD PVC DRAIN (AUTOTRANSFUSION) ×2 IMPLANT
BANDAGE ELASTIC 4 VELCRO ST LF (GAUZE/BANDAGES/DRESSINGS) ×2 IMPLANT
BANDAGE ELASTIC 6 VELCRO ST LF (GAUZE/BANDAGES/DRESSINGS) ×2 IMPLANT
BANDAGE ESMARK 6X9 LF (GAUZE/BANDAGES/DRESSINGS) ×1 IMPLANT
BANDAGE GAUZE ELAST BULKY 4 IN (GAUZE/BANDAGES/DRESSINGS) ×4 IMPLANT
BLADE SAGITTAL 25.0X1.19X90 (BLADE) ×2 IMPLANT
BLADE SURG ROTATE 9660 (MISCELLANEOUS) IMPLANT
BNDG CMPR 9X6 STRL LF SNTH (GAUZE/BANDAGES/DRESSINGS) ×1
BNDG ESMARK 6X9 LF (GAUZE/BANDAGES/DRESSINGS) ×2
BOWL SMART MIX CTS (DISPOSABLE) ×2 IMPLANT
CEMENT HV SMART SET (Cement) ×2 IMPLANT
CLOTH BEACON ORANGE TIMEOUT ST (SAFETY) ×2 IMPLANT
COVER SURGICAL LIGHT HANDLE (MISCELLANEOUS) ×2 IMPLANT
CUFF TOURNIQUET SINGLE 34IN LL (TOURNIQUET CUFF) ×2 IMPLANT
CUFF TOURNIQUET SINGLE 44IN (TOURNIQUET CUFF) IMPLANT
DRAPE EXTREMITY T 121X128X90 (DRAPE) ×2 IMPLANT
DRAPE PROXIMA HALF (DRAPES) ×2 IMPLANT
DRAPE U-SHAPE 47X51 STRL (DRAPES) ×2 IMPLANT
DRSG ADAPTIC 3X8 NADH LF (GAUZE/BANDAGES/DRESSINGS) ×2 IMPLANT
DRSG PAD ABDOMINAL 8X10 ST (GAUZE/BANDAGES/DRESSINGS) ×2 IMPLANT
DURAPREP 26ML APPLICATOR (WOUND CARE) ×2 IMPLANT
ELECT REM PT RETURN 9FT ADLT (ELECTROSURGICAL) ×2
ELECTRODE REM PT RTRN 9FT ADLT (ELECTROSURGICAL) ×1 IMPLANT
EVACUATOR 1/8 PVC DRAIN (DRAIN) IMPLANT
GLOVE BIO SURGEON STRL SZ8.5 (GLOVE) IMPLANT
GLOVE BIOGEL PI IND STRL 8 (GLOVE) ×1 IMPLANT
GLOVE BIOGEL PI IND STRL 8.5 (GLOVE) IMPLANT
GLOVE BIOGEL PI INDICATOR 8 (GLOVE) ×1
GLOVE BIOGEL PI INDICATOR 8.5 (GLOVE)
GLOVE SS BIOGEL STRL SZ 8 (GLOVE) ×1 IMPLANT
GLOVE SUPERSENSE BIOGEL SZ 8 (GLOVE) ×1
GOWN PREVENTION PLUS XLARGE (GOWN DISPOSABLE) ×2 IMPLANT
GOWN PREVENTION PLUS XXLARGE (GOWN DISPOSABLE) IMPLANT
GOWN STRL NON-REIN LRG LVL3 (GOWN DISPOSABLE) ×4 IMPLANT
HANDPIECE INTERPULSE COAX TIP (DISPOSABLE) ×2
HOOD PEEL AWAY FACE SHEILD DIS (HOOD) ×4 IMPLANT
INSERT TIB LCS RP LRG 15 (Knees) ×2 IMPLANT
KIT BASIN OR (CUSTOM PROCEDURE TRAY) ×2 IMPLANT
KIT ROOM TURNOVER OR (KITS) ×2 IMPLANT
MANIFOLD NEPTUNE II (INSTRUMENTS) ×2 IMPLANT
NEEDLE 18GX1X1/2 (RX/OR ONLY) (NEEDLE) IMPLANT
NS IRRIG 1000ML POUR BTL (IV SOLUTION) ×2 IMPLANT
PACK TOTAL JOINT (CUSTOM PROCEDURE TRAY) ×2 IMPLANT
PAD ARMBOARD 7.5X6 YLW CONV (MISCELLANEOUS) ×4 IMPLANT
PAD CAST 4YDX4 CTTN HI CHSV (CAST SUPPLIES) ×1 IMPLANT
PADDING CAST COTTON 4X4 STRL (CAST SUPPLIES) ×2
PADDING CAST COTTON 6X4 STRL (CAST SUPPLIES) ×2 IMPLANT
SET HNDPC FAN SPRY TIP SCT (DISPOSABLE) ×1 IMPLANT
SPONGE GAUZE 4X4 12PLY (GAUZE/BANDAGES/DRESSINGS) ×2 IMPLANT
STAPLER VISISTAT 35W (STAPLE) ×2 IMPLANT
SUCTION FRAZIER TIP 10 FR DISP (SUCTIONS) ×2 IMPLANT
SUT VIC AB 0 CT1 27 (SUTURE) ×4
SUT VIC AB 0 CT1 27XBRD ANBCTR (SUTURE) ×2 IMPLANT
SUT VIC AB 1 CTB1 27 (SUTURE) ×4 IMPLANT
SUT VIC AB 2-0 CT1 27 (SUTURE) ×4
SUT VIC AB 2-0 CT1 TAPERPNT 27 (SUTURE) ×2 IMPLANT
SUT VLOC 180 0 24IN GS25 (SUTURE) ×2 IMPLANT
SYR 50ML SLIP (SYRINGE) IMPLANT
TOWEL OR 17X24 6PK STRL BLUE (TOWEL DISPOSABLE) ×2 IMPLANT
TOWEL OR 17X26 10 PK STRL BLUE (TOWEL DISPOSABLE) ×2 IMPLANT
TRAY FOLEY CATH 14FR (SET/KITS/TRAYS/PACK) ×2 IMPLANT
TRAY TIB SZ 5 REVISION (Knees) ×2 IMPLANT
TUBE ANAEROBIC SPECIMEN COL (MISCELLANEOUS) IMPLANT
WATER STERILE IRR 1000ML POUR (IV SOLUTION) ×6 IMPLANT

## 2012-03-28 NOTE — Anesthesia Preprocedure Evaluation (Signed)
Anesthesia Evaluation  Patient identified by MRN, date of birth, ID band Patient awake    Reviewed: Allergy & Precautions, H&P , NPO status , Patient's Chart, lab work & pertinent test results, reviewed documented beta blocker date and time   History of Anesthesia Complications Negative for: history of anesthetic complications  Airway Mallampati: III TM Distance: >3 FB Neck ROM: Full    Dental No notable dental hx. (+) Caps and Dental Advisory Given   Pulmonary former smoker (quit 40 years ) breath sounds clear to auscultation  Pulmonary exam normal       Cardiovascular hypertension, Pt. on medications and Pt. on home beta blockers + CAD and + Cardiac Stents (DES RCA 2008, non-DES LAD 2002, on Plavix) Rhythm:Regular Rate:Normal  '12  Myoview:  No ischemia, EF 58% '12 ECHO: LVH with EF >55%   Neuro/Psych  Neuromuscular disease (myasthenia gravis (eye symptoms)- mestinon) negative psych ROS   GI/Hepatic Neg liver ROS, GERD-  Medicated and Controlled,  Endo/Other  Morbid obesity  Renal/GU negative Renal ROS     Musculoskeletal  (+) Arthritis -,   Abdominal (+) + obese,   Peds  Hematology negative hematology ROS (+)   Anesthesia Other Findings   Reproductive/Obstetrics                           Anesthesia Physical Anesthesia Plan  ASA: III  Anesthesia Plan: General   Post-op Pain Management:    Induction: Intravenous  Airway Management Planned: LMA  Additional Equipment:   Intra-op Plan:   Post-operative Plan:   Informed Consent: I have reviewed the patients History and Physical, chart, labs and discussed the procedure including the risks, benefits and alternatives for the proposed anesthesia with the patient or authorized representative who has indicated his/her understanding and acceptance.   Dental advisory given  Plan Discussed with: CRNA and Surgeon  Anesthesia Plan  Comments: (Plan routine monitors, GA- LMA OK, femoral nerve block for post op analgesia)        Anesthesia Quick Evaluation

## 2012-03-28 NOTE — Progress Notes (Signed)
Orthopedic Tech Progress Note Patient Details:  [name] 08/05/1925 [id number]  CPM Right Knee CPM Right Knee: On Right Knee Flexion (Degrees): 90  Right Knee Extension (Degrees): 0  Additional Comments: trapeze bar   Cammer, Mickie Bail 03/28/2012, 3:29 PM

## 2012-03-28 NOTE — Brief Op Note (Signed)
[name] [id number] 03/28/2012   PRE-OP DIAGNOSIS: painful right TKR  POST-OP DIAGNOSIS: same  PROCEDURE: revision right TKR  ANESTHESIA: general and block  [facility]   Dictation #:  [id number]

## 2012-03-28 NOTE — Consult Note (Addendum)
[name] is an 78 y.o. male.    PCP: Nelwyn Salisbury, MD    His neurologist is Dr. Lesia Sago His cardiologist is Dr. Alanda Amass   Reason for Consult: Management of coronary artery disease, and myasthenia gravis  Referring Physician: Marcene Corning   Chief Complaint: Knee pain  HPI: This is 78 year old, Caucasian male, with a past medical history of coronary artery disease, hypertension, myasthenia gravis, who underwent a right total knee replacement earlier today. Patient currently still under the effects of anesthesia and is unable to provide much history. He denies any significant pain in his recent right knee at this time. Denies any shortness of breath or chest pains. More history will need to be obtained when the patient is fully awake and alert.   Prior to Admission medications   Medication Sig Start Date End Date Taking? Authorizing Provider  calcium-vitamin D (OSCAL WITH D) 500-200 MG-UNIT per tablet Take 1 tablet by mouth 4 (four) times daily.    Yes Historical Provider, MD  clopidogrel (PLAVIX) 75 MG tablet Take 75 mg by mouth daily.   Yes Historical Provider, MD  HYDROcodone-acetaminophen (VICODIN) 5-500 MG per tablet Take 1 tablet by mouth every 6 (six) hours as needed for pain. 03/15/12  Yes Nelwyn Salisbury, MD  metoprolol succinate (TOPROL-XL) 50 MG 24 hr tablet Take 50 mg by mouth daily after breakfast. Take with or immediately following a meal.   Yes Historical Provider, MD  Multiple Vitamin (MULTIVITAMIN WITH MINERALS) TABS Take 1 tablet by mouth daily.   Yes Historical Provider, MD  mycophenolate (CELLCEPT) 500 MG tablet Take 500 mg by mouth daily.     Yes Historical Provider, MD  pantoprazole (PROTONIX) 40 MG tablet Take 40 mg by mouth 2 (two) times daily as needed. For upset stomach   Yes Historical Provider, MD  polyethylene glycol (MIRALAX / GLYCOLAX) packet Take 17 g by mouth daily as needed. For constipation   Yes Historical Provider, MD  predniSONE  (DELTASONE) 2.5 MG tablet Take 7.5 mg by mouth every other day.   Yes Historical Provider, MD  promethazine (PHENERGAN) 25 MG tablet Take 25 mg by mouth every 6 (six) hours as needed. For nausea   Yes Historical Provider, MD  pyridostigmine (MESTINON) 60 MG tablet Take 60 mg by mouth 4 (four) times daily.    Yes Historical Provider, MD  rosuvastatin (CRESTOR) 10 MG tablet Take 10 mg by mouth daily after breakfast.    Yes Historical Provider, MD  omega-3 acid ethyl esters (LOVAZA) 1 G capsule Take 2 g by mouth daily.    Historical Provider, MD    Allergies:  Allergies  Allergen Reactions  . Dicyclomine Other (See Comments)    REACTION: can worsen myasthenia gravis    Past Medical History  Diagnosis Date  . Coronary artery disease   . Diverticulitis   . GERD (gastroesophageal reflux disease)   . Benign prostatic hypertrophy   . Partial small bowel obstruction 04/16/10    resolved with bowel rest  . Hypertension     Dr. Alanda Amass- within a month or 2 to clear for surgery   . Shortness of breath   . Pulmonary embolus     2000  . Arthritis     back, hips, knees   . Cancer     h/o renal carcinoma   . Chronic kidney disease     h/o UTI-2012  . Myasthenia gravis   . Myasthenia gravis     Dr.  Anne Hahn    Past Surgical History  Procedure Date  . Total knee arthroplasty     both Sees Dr. Margreta Journey  . Nephrectomy     left  . Angioplasty     had 2 stent replacements  . Stented placed 1/08  . Colonoscopy 05/28/10    per Dr. Carman Ching, diverticulosis, no repeats planned   . Sp kyphoplasty     and vertebroplasty  . Cardiac catheterization     2008, stents   . Eye surgery     cataracts(bilateral)  removed, ?iol  . Tonsillectomy   . Joint replacement     2005-R, L knee replacement- 2000    Social History: He was able to tell me that he lives in [place] with his wife. However rest of the components of the social history could not be obtained.  Family History: Unable to  obtain as the patient is under affects of anesthesia  Review of Systems -  unobtainable from patient due to effects of anesthesia  Physical Examination Blood pressure 158/75, pulse 85, temperature 97.9 F (36.6 C), temperature source Oral, resp. rate 15, SpO2 98.00%.  General appearance: appears stated age, no distress and slowed mentation Head: Normocephalic, without obvious abnormality, atraumatic Eyes: conjunctivae/corneas clear. PERRL, EOM's intact.  Neck: no adenopathy, no carotid bruit, no JVD, supple, symmetrical, trachea midline and thyroid not enlarged, symmetric, no tenderness/mass/nodules Resp: clear to auscultation bilaterally Cardio: regular rate and rhythm, S1, S2 normal, no murmur, click, rub or gallop GI: soft, non-tender; bowel sounds normal; no masses,  no organomegaly Extremities: Right LE was not examined due to recent surgery. Other extremities normal, atraumatic, no cyanosis or edema Pulses: 2+ and symmetric Skin: Skin color, texture, turgor normal. No rashes or lesions Lymph nodes: Cervical, supraclavicular, and axillary nodes normal. Neurologic: Drowsy but arousable. No focal deficits.  Labs done on June 18 were reviewed.  Assessment/Plan  Active Problems:  MYASTHENIA GRAVIS W/O (ACUTE) EXACERBATION  HYPERTENSION  CORONARY ARTERY DISEASE  GERD   So, this is an 78 year old, Caucasian male, who underwent a right total knee replacement earlier today. Patient appears to be medically stable at this time. He still under the effects of anesthesia.  #1 history of coronary artery disease: This issue appears to be stable. Patient denies any chest pain or shortness of breath. For this surgery by [facility]. Heart and vascular. He is known to have a normal ejection fraction. He's had a stent placed to the RCA in 2008, and has had low risk stress testing subsequently. Continue to monitor closely. Continue with his antiplatelet agents as long, as it's okay with  orthopedic service.  #2 history of myasthenia gravis: Continue with Mestinon. Patient tells me that he is also on CellCept and prednisone for the same reason. In view the surgery we will give him a slightly higher dose of prednisone to deal with the surgical stress and quickly taper down to his usual home dose off of prednisone.   #3 history of hypertension: Continue to monitor. Blood pressures closely.  #4 history of GERD continue PPI.  He is a full code.  DVT, prophylaxis as per orthopedic surgeon.  Would like to thank Dr. Jerl Santos for this consultation. We will follow this patient along with him while he is hospitalized.  [facility]  [facility] Pager [phone]  03/28/2012, 4:03 PM

## 2012-03-28 NOTE — Anesthesia Procedure Notes (Signed)
Anesthesia Regional Block:  Femoral nerve block  Pre-Anesthetic Checklist: ,, timeout performed, Correct Patient, Correct Site, Correct Laterality, Correct Procedure, Correct Position, site marked, Risks and benefits discussed,  Surgical consent,  Pre-op evaluation,  At surgeon's request and post-op pain management  Laterality: Right  Prep: chloraprep       Needles:  Injection technique: Single-shot  Needle Type: Stimulator Needle - 40     Needle Length: 4cm  Needle Gauge: 22 and 22 G    Additional Needles:  Procedures: nerve stimulator Femoral nerve block  Nerve Stimulator or Paresthesia:  Response: patella twitch, 0.45 mA, 0.1 ms,   Additional Responses:   Narrative:  Start time: 03/28/2012 10:40 AM End time: 03/28/2012 10:47 AM Injection made incrementally with aspirations every 5 mL.  Performed by: Personally  Anesthesiologist: Sandford Craze, MD  Additional Notes: Pt identified in Holding room.  Monitors applied. Working IV access confirmed. Sterile prep R groin.  #22ga PNS to patella twitch at 0.36mA threshold.  30cc 0.5% Bupivacaine with 1:200k epi injected incrementally after negative test dose.  Patient asymptomatic, VSS, no heme aspirated, tolerated well.   Sandford Craze, MD  Femoral nerve block

## 2012-03-28 NOTE — Progress Notes (Signed)
Orthopedic Tech Progress Note Patient Details:  [name] 1925-01-27 [id number] Drop off Ortho Devices Type of Ortho Device: Knee Immobilizer Ortho Device/Splint Location: right LE Ortho Device/Splint Interventions: Ordered;Other (comment) (dropped off in pt. room)   [place] R Thompson 03/28/2012, 5:02 PM

## 2012-03-28 NOTE — Interval H&P Note (Signed)
History and Physical Interval Note:  03/28/2012 9:51 AM  [name]  has presented today for surgery, with the diagnosis of PAINFUL RIGHT TOTAL KNEE  The various methods of treatment have been discussed with the patient and family. After consideration of risks, benefits and other options for treatment, the patient has consented to  Procedure(s) (LRB): TOTAL KNEE REVISION (Right) as a surgical intervention .  The patient's history has been reviewed, patient examined, no change in status, stable for surgery.  I have reviewed the patients' chart and labs.  Questions were answered to the patient's satisfaction.     Geniece Akers G

## 2012-03-28 NOTE — Preoperative (Signed)
Beta Blockers   Reason not to administer Beta Blockers:Beta blocker taken this am 

## 2012-03-28 NOTE — Anesthesia Postprocedure Evaluation (Signed)
Anesthesia Post Note  Patient: [name]  Procedure(s) Performed: Procedure(s) (LRB): TOTAL KNEE REVISION (Right)  Anesthesia type: General  Patient location: PACU  Post pain: Pain level controlled and Adequate analgesia  Post assessment: Post-op Vital signs reviewed, Patient's Cardiovascular Status Stable, Respiratory Function Stable, Patent Airway and Pain level controlled  Last Vitals:  Filed Vitals:   03/28/12 1514  BP: 158/75  Pulse: 85  Temp:   Resp: 15    Post vital signs: Reviewed and stable  Level of consciousness: awake, alert  and oriented  Complications: No apparent anesthesia complications

## 2012-03-28 NOTE — Transfer of Care (Signed)
Immediate Anesthesia Transfer of Care Note  Patient: [name]  Procedure(s) Performed: Procedure(s) (LRB): TOTAL KNEE REVISION (Right)  Patient Location: PACU  Anesthesia Type: General  Level of Consciousness: sedated  Airway & Oxygen Therapy: Patient Spontanous Breathing and Patient connected to face mask oxygen  Post-op Assessment: Report given to PACU RN, Post -op Vital signs reviewed and stable and Patient moving all extremities  Post vital signs: Reviewed and stable  Complications: No apparent anesthesia complications

## 2012-03-29 DIAGNOSIS — G7000 Myasthenia gravis without (acute) exacerbation: Secondary | ICD-10-CM

## 2012-03-29 DIAGNOSIS — K219 Gastro-esophageal reflux disease without esophagitis: Secondary | ICD-10-CM

## 2012-03-29 DIAGNOSIS — Z96659 Presence of unspecified artificial knee joint: Secondary | ICD-10-CM

## 2012-03-29 DIAGNOSIS — T847XXA Infection and inflammatory reaction due to other internal orthopedic prosthetic devices, implants and grafts, initial encounter: Secondary | ICD-10-CM

## 2012-03-29 DIAGNOSIS — I10 Essential (primary) hypertension: Secondary | ICD-10-CM

## 2012-03-29 DIAGNOSIS — I2510 Atherosclerotic heart disease of native coronary artery without angina pectoris: Secondary | ICD-10-CM

## 2012-03-29 LAB — CBC
MCH: 31.5 pg (ref 26.0–34.0)
MCV: 93.8 fL (ref 78.0–100.0)
Platelets: 152 10*3/uL (ref 150–400)
RDW: 14.1 % (ref 11.5–15.5)
WBC: 5.4 10*3/uL (ref 4.0–10.5)

## 2012-03-29 LAB — BASIC METABOLIC PANEL
Calcium: 8.5 mg/dL (ref 8.4–10.5)
Creatinine, Ser: 0.98 mg/dL (ref 0.50–1.35)
GFR calc Af Amer: 85 mL/min — ABNORMAL LOW (ref >90)
Sodium: 139 mEq/L (ref 135–145)

## 2012-03-29 NOTE — Op Note (Signed)
[name], [name] NO.:  [id number]  MEDICAL RECORD NO.:  [id number]  LOCATION:  5N30C                        FACILITY:  [facility]  PHYSICIAN:  Lubertha Basque. Arlan Birks, M.D.DATE OF BIRTH:  04/28/1925  DATE OF PROCEDURE:  03/28/2012 DATE OF DISCHARGE:                              OPERATIVE REPORT   PREOPERATIVE DIAGNOSIS:  Painful loose right total knee replacement.  POSTOPERATIVE DIAGNOSIS:  Painful loose right total knee replacement.  PROCEDURE:  Right total knee replacement revision.  ANESTHESIA:  General and block.  ATTENDING SURGEON:  Lubertha Basque. Jerl Santos, MD  ASSISTANT:  Lindwood Qua, PA  INDICATION FOR PROCEDURE:  The patient is an 78 year old man with several years of a painful right total knee replacement.  This was placed about 8 years ago.  He initially did very well.  By x-ray, he has been noted to have some lucency around the tibial component and a bone scan confirmed probable loosening of this component.  He had aspiration of the knee which has always returned sterile fluid.  He has pain which is limiting to him and he is offered a revision operation after clearance was achieved through his family doctor, cardiologist, and neurologist.  Informed operative consent was obtained after discussion of possible complications including reaction to anesthesia, infection, DVT, PE, and death.  SUMMARY OF FINDINGS AND PROCEDURE:  Under general anesthesia and a block through his old incision, a right knee replacement was exposed.  The tibial component was indeed loose at the component cement interface. This was easily removed.  We removed all the cement as well and revised to a size 5 MBT tibial tray and changed the spacer to 15 mm in size.  He was closed primarily, admitted back to the Orthopedic Surgery Service with a medical consult to be called.  DESCRIPTION OF PROCEDURE:  The patient was taken to the operating suite where general anesthetic was applied  without difficulty.  He was also given a block in the preanesthesia area.  He was positioned supine and prepped and draped in normal sterile fashion.  After the administration of IV Kefzol and appropriate time out, the right leg was elevated, exsanguinated, tourniquet inflated about the thigh.  His old longitudinal anterior incision was made with dissection down to the capsule.  This was incised in medial parapatellar fashion.  The kneecap was split off to the side and the knee flexed.  He had a small amount of fluid which appeared normal.  The spacer was removed without difficulty and the tibial component was examined.  This was grossly loose at the component cement interface and was removed simply.  The entire cement mantle remained in place.  This was then removed sacrificing just a very tiny amount of bone.  We then sized this to a 5 and reamed for the MBT stem.  We then placed a size 5 MBT stem.  I first used pulsatile lavage and cleansed surfaces completely and then dried them.  I trimmed excess cement. Pressure was held on the component until the cement had hardened.  We then set several trial reductions and it seemed best in terms of stability and motion with a size 15 deep dish  spacer.  This was applied.  I also performed a synovectomy on the anterior aspect of the knee.  The tourniquet was deflated and a small amount of bleeding was easily controlled with Bovie cautery.  The knee was again irrigated followed by placement of a drain exiting superolaterally.  We then reapproximated the extensor mechanism with V-Loc suture in running fashion.  Subcutaneous tissues were reapproximated with 0 and 2-0 undyed Vicryl and skin was closed with staples.  Adaptic was applied followed by dry gauze and loose Ace wrap.  Estimated blood loss and fluids obtained from anesthesia records as can accurate tourniquet time.  DISPOSITION:  The patient was extubated in the operating room and taken to  recovery room in stable addition.  He was to be admitted to the Orthopedic Surgery Service.  Appropriate postop care to include perioperative antibiotics and aspirin for DVT prophylaxis.     Lubertha Basque Jerl Santos, M.D.     PGD/MEDQ  D:  03/28/2012  T:  03/29/2012  Job:  [id number]

## 2012-03-29 NOTE — Progress Notes (Signed)
Subjective: 1 Day Post-Op Procedure(s) (LRB): TOTAL KNEE REVISION (Right)  Activity level:  Bedrest and CPM Diet tolerance:  liquids Voiding:  Foley still in place Patient reports pain as moderate.    Objective: Vital signs in last 24 hours: Temp:  [96.8 F (36 C)-98.3 F (36.8 C)] 97.9 F (36.6 C) (06/26 0440) Pulse Rate:  [72-85] 81  (06/26 0440) Resp:  [13-18] 16  (06/26 0440) BP: (102-158)/(63-98) 114/63 mmHg (06/26 0440) SpO2:  [94 %-98 %] 94 % (06/26 0440) Weight:  [124 kg (273 lb 5.9 oz)] 124 kg (273 lb 5.9 oz) (06/26 0554)  Labs: No results found for this basename: HGB:5 in the last 72 hours No results found for this basename: WBC:2,RBC:2,HCT:2,PLT:2 in the last 72 hours No results found for this basename: NA:2,K:2,CL:2,CO2:2,BUN:2,CREATININE:2,GLUCOSE:2,CALCIUM:2 in the last 72 hours No results found for this basename: LABPT:2,INR:2 in the last 72 hours  Physical Exam:  Neurologically intact ABD soft Sensation intact distally Intact pulses distally Dorsiflexion/Plantar flexion intact Compartment soft  Assessment/Plan:  1 Day Post-Op Procedure(s) (LRB): TOTAL KNEE REVISION (Right) Advance diet Up with therapy  Will likely need placement temporarily  Appreciate medical team help and stress steroid order  Continues on his baseline plavix and have bumped his ASA to 325 bid for DVT prophylaxis(for next 2 weeks)    Neytiri Asche G 03/29/2012, 6:36 AM

## 2012-03-29 NOTE — Evaluation (Signed)
Physical Therapy Evaluation Patient Details Name: [name] MRN: [id number] DOB: 24-Jun-1925 Today's Date: 03/29/2012 Time: 4098-1191 PT Time Calculation (min): 25 min  PT Assessment / Plan / Recommendation Clinical Impression  pt presents with R TKRevision.  pt at this time requiring 2 person A to get to chair.  discussed with pt need for ST-SNF at D/C, but pt not very receptive to this idea.  pt seems to have decreased cognition limiting awareness of deficits and safety awareness.  Feel pt needs SNF.      PT Assessment  Patient needs continued PT services    Follow Up Recommendations  Skilled nursing facility    Barriers to Discharge Other (comment) pt notes wife is primary CG and he has multiple steps to enter home.      Equipment Recommendations  Defer to next venue    Recommendations for Other Services OT consult   Frequency 7X/week    Precautions / Restrictions Precautions Precautions: Knee Required Braces or Orthoses: Knee Immobilizer - Right Knee Immobilizer - Right: Discontinue once straight leg raise with < 10 degree lag Restrictions Weight Bearing Restrictions: Yes RLE Weight Bearing: Weight bearing as tolerated   Pertinent Vitals/Pain Not much pain at all.        Mobility  Bed Mobility Bed Mobility: Supine to Sit;Sitting - Scoot to Edge of Bed Supine to Sit: 4: Min assist;With rails;HOB elevated (HOB ~ 35 degrees) Sitting - Scoot to Edge of Bed: 4: Min assist Details for Bed Mobility Assistance: cues for safe technique, encouragement Transfers Transfers: Sit to Stand;Stand to Sit Sit to Stand: 1: +2 Total assist;With upper extremity assist;From bed Sit to Stand: Patient Percentage: 50% Stand to Sit: 1: +2 Total assist;With upper extremity assist;With armrests;To chair/3-in-1 Stand to Sit: Patient Percentage: 40% Details for Transfer Assistance: cues for use of UEs, positioning of LEs, safe technique Ambulation/Gait Ambulation/Gait Assistance: 1: +2  Total assist Ambulation/Gait: Patient Percentage: 60% Ambulation Distance (Feet): 3 Feet Assistive device: Rolling walker Ambulation/Gait Assistance Details: cues for use of RW, movement of LEs, upright posture, safety.  pt's R knee buckles and pt with decreased awareness of buckling and need for 2 person A.   Gait Pattern: Step-to pattern;Trunk flexed Stairs: No Wheelchair Mobility Wheelchair Mobility: No    Exercises Total Joint Exercises Ankle Circles/Pumps: AROM;Both;10 reps Quad Sets: AROM;Both;10 reps   PT Diagnosis: Abnormality of gait;Acute pain  PT Problem List: Decreased strength;Decreased range of motion;Decreased activity tolerance;Decreased balance;Decreased mobility;Decreased cognition;Decreased knowledge of use of DME;Decreased safety awareness;Pain PT Treatment Interventions: DME instruction;Gait training;Stair training;Functional mobility training;Therapeutic activities;Therapeutic exercise;Balance training;Cognitive remediation;Patient/family education   PT Goals Acute Rehab PT Goals PT Goal Formulation: With patient Time For Goal Achievement: 04/05/12 Potential to Achieve Goals: Good Pt will go Supine/Side to Sit: with supervision PT Goal: Supine/Side to Sit - Progress: Goal set today Pt will go Sit to Supine/Side: with supervision PT Goal: Sit to Supine/Side - Progress: Goal set today Pt will go Sit to Stand: with min assist;with upper extremity assist PT Goal: Sit to Stand - Progress: Goal set today Pt will go Stand to Sit: with min assist;with upper extremity assist PT Goal: Stand to Sit - Progress: Goal set today Pt will Ambulate: 51 - 150 feet;with min assist;with rolling walker PT Goal: Ambulate - Progress: Goal set today Pt will Go Up / Down Stairs: 3-5 stairs;with min assist;with least restrictive assistive device PT Goal: Up/Down Stairs - Progress: Goal set today Pt will Perform Home Exercise Program: with supervision, verbal  cues required/provided PT  Goal: Perform Home Exercise Program - Progress: Goal set today  Visit Information  Last PT Received On: 03/29/12 Assistance Needed: +2    Subjective Data  Subjective: I'm stronger than most people my age.   Patient Stated Goal: Home   Prior Functioning  Home Living Lives With: Spouse Lucila [place]) Available Help at Discharge: Family;Friend(s);Neighbor;Available 24 hours/day Type of Home: House Home Access: Stairs to enter [employer] of Steps: 6 Entrance Stairs-Rails: Right;Left;Can reach both Home Layout: Two level;Able to live on main level with bedroom/bathroom Bathroom Shower/Tub: Engineer, manufacturing systems: Standard Bathroom Accessibility: Yes How Accessible: Accessible via walker Home Adaptive Equipment: Shower chair without back;Bedside commode/3-in-1 Prior Function Level of Independence: Independent Able to Take Stairs?: Yes Driving: Yes 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  ) Communication Communication: No difficulties    Cognition  Overall Cognitive Status: Impaired Area of Impairment: Memory;Awareness of deficits;Safety/judgement Arousal/Alertness: Awake/alert Orientation Level: Oriented X4 / Intact Behavior During Session: [facility] for tasks performed Memory Deficits: pt repeats self multiple times during PT.   Safety/Judgement: Decreased safety judgement for tasks assessed;Decreased awareness of need for assistance Safety/Judgement - Other Comments: pt requiring 2 person A for 3 steps to a chair and pt noting he would be fine if he had a RW, yet pt was using a RW at that time.   Awareness of Deficits: pt not fully aware of LE weakness.   Cognition - Other Comments: ? If cognitive deficits are baseline.  No family to verify.      Extremity/Trunk Assessment Right Lower Extremity Assessment RLE ROM/Strength/Tone: Deficits RLE ROM/Strength/Tone Deficits: AAROM ~15-60 RLE Sensation: WFL - Light Touch Left Lower Extremity Assessment LLE  ROM/Strength/Tone: WFL for tasks assessed LLE Sensation: WFL - Light Touch   Balance Balance Balance Assessed: No  End of Session PT - End of Session Equipment Utilized During Treatment: Gait belt;Right knee immobilizer Activity Tolerance: Patient tolerated treatment well Patient left: in chair;with call bell/phone within reach Nurse Communication: Mobility status  GP     Sunny Schlein, [place] [phone] 03/29/2012, 12:37 PM

## 2012-03-29 NOTE — Progress Notes (Signed)
UR COMPLETED  

## 2012-03-29 NOTE — Consult Note (Signed)
Physical Medicine and Rehabilitation Consult Reason for Consult: Right total knee revision Referring Physician: Dr. Jerl Santos HPI: [name] is a 78 y.o. right-handed male with history of myasthenia gravis as well as right total knee replacement 2005. Admitted 03/28/2012 with progressive pain of right knee. X-rays and imaging suggestive of tibial component loosening. Recent aspiration shows 1000 WBCs. Patient reports decrease in mobility and trouble sleeping due to knee pain. Underwent right total knee replacement revision 03/28/2012 per Dr. Jerl Santos. Postoperative pain management. He continues on CellCept as well his Mestinon for history of myasthenia gravis. He is weightbearing as tolerated to the right lower extremity. Sequential compression devices in place for DVT prophylaxis. Physical and occupational therapy are pending. M.D. has requested physical medicine rehabilitation consult to consider inpatient rehabilitation services The patient states he's had no symptoms from his myasthenia gravis recently. No significant postoperative complications thus far, tolerated first time up with PT and OT, bed mobility min assist with +2 total 4 ambulation a few steps.  Review of Systems  Gastrointestinal: Positive for constipation.  Musculoskeletal: Positive for joint pain.  Neurological: Positive for weakness and headaches.  All other systems reviewed and are negative.   Past Medical History  Diagnosis Date  . Coronary artery disease   . Diverticulitis   . GERD (gastroesophageal reflux disease)   . Benign prostatic hypertrophy   . Partial small bowel obstruction 04/16/10    resolved with bowel rest  . Hypertension     Dr. Alanda Amass- within a month or 2 to clear for surgery   . Shortness of breath   . Pulmonary embolus     2000  . Arthritis     back, hips, knees   . Cancer     h/o renal carcinoma   . Chronic kidney disease     h/o UTI-2012  . Myasthenia gravis   . Myasthenia gravis    Dr. Anne Hahn   Past Surgical History  Procedure Date  . Total knee arthroplasty     both Sees Dr. Margreta Journey  . Nephrectomy     left  . Angioplasty     had 2 stent replacements  . Stented placed 1/08  . Colonoscopy 05/28/10    per Dr. Carman Ching, diverticulosis, no repeats planned   . Sp kyphoplasty     and vertebroplasty  . Cardiac catheterization     2008, stents   . Eye surgery     cataracts(bilateral)  removed, ?iol  . Tonsillectomy   . Joint replacement     2005-R, L knee replacement- 2000   History reviewed. No pertinent family history. Social History:  reports that he quit smoking about 53 years ago. He has never used smokeless tobacco. He reports that he does not drink alcohol or use illicit drugs. Allergies:  Allergies  Allergen Reactions  . Dicyclomine Other (See Comments)    REACTION: can worsen myasthenia gravis   Medications Prior to Admission  Medication Sig Dispense Refill  . calcium-vitamin D (OSCAL WITH D) 500-200 MG-UNIT per tablet Take 1 tablet by mouth 4 (four) times daily.       . clopidogrel (PLAVIX) 75 MG tablet Take 75 mg by mouth daily.      Marland Kitchen HYDROcodone-acetaminophen (VICODIN) 5-500 MG per tablet Take 1 tablet by mouth every 6 (six) hours as needed for pain.  120 tablet  5  . metoprolol succinate (TOPROL-XL) 50 MG 24 hr tablet Take 50 mg by mouth daily after breakfast. Take with or immediately following a  meal.      . Multiple Vitamin (MULTIVITAMIN WITH MINERALS) TABS Take 1 tablet by mouth daily.      . mycophenolate (CELLCEPT) 500 MG tablet Take 500 mg by mouth daily.        . pantoprazole (PROTONIX) 40 MG tablet Take 40 mg by mouth 2 (two) times daily as needed. For upset stomach      . polyethylene glycol (MIRALAX / GLYCOLAX) packet Take 17 g by mouth daily as needed. For constipation      . predniSONE (DELTASONE) 2.5 MG tablet Take 7.5 mg by mouth every other day.      . promethazine (PHENERGAN) 25 MG tablet Take 25 mg by mouth every 6 (six)  hours as needed. For nausea      . pyridostigmine (MESTINON) 60 MG tablet Take 60 mg by mouth 4 (four) times daily.       . rosuvastatin (CRESTOR) 10 MG tablet Take 10 mg by mouth daily after breakfast.       . omega-3 acid ethyl esters (LOVAZA) 1 G capsule Take 2 g by mouth daily.        Home:    Functional History:   Functional Status:  Mobility:          ADL:    Cognition: Cognition Orientation Level: Oriented X4    Blood pressure 114/63, pulse 81, temperature 97.9 F (36.6 C), temperature source Oral, resp. rate 16, SpO2 94.00%. Physical Exam  Constitutional: He is oriented to person, place, and time.  Neck: Neck supple.  Cardiovascular: Regular rhythm.   Pulmonary/Chest: Breath sounds normal. No respiratory distress.  Abdominal: He exhibits no distension. There is no tenderness.  Musculoskeletal: He exhibits no edema.  Neurological: He is alert and oriented to person, place, and time.  Skin:       Knee immobilizer with dry dressing to right knee in place  Psychiatric:       Mood is flat but appropriate  5/5 motor strength in bilateral deltoid, biceps, triceps, grip, 5/5 in the left hip flexor knee extensor ankle dorsiflexor 3 minus in the right hip flexor. Knee not tested due to knee immobilizer. Ankle dorsiflexor plantar flexor 4+/5 Sensation is intact to light touch in bilateral lower extremities  No results found for this or any previous visit (from the past 24 hour(s)). No results found.  Assessment/Plan: Diagnosis: right knee revision do to infection. No postoperative complications thus far 1. Does the need for close, 24 hr/day medical supervision in concert with the patient's rehab needs make it unreasonable for this patient to be served in a less intensive setting? No 2. Co-Morbidities requiring supervision/potential complications: myasthenia gravis asymptomatic 3. Due to bowel management, safety, skin/wound care, medication administration and pain  management, does the patient require 24 hr/day rehab nursing? Potentially 4. Does the patient require coordinated care of a physician, rehab nurse, PT (0.5-1 hrs/day, pre- days/week) and OT (0.5-1 hrs/day, 3 days/week) to address physical and functional deficits in the context of the above medical diagnosis(es)? Potentially Addressing deficits in the following areas: balance, endurance, locomotion, strength, transferring, bathing, dressing and toileting 5. Can the patient actively participate in an intensive therapy program of at least 3 hrs of therapy per day at least 5 days per week? Potentially 6. The potential for patient to make measurable gains while on inpatient rehab is good 7. Anticipated functional outcomes upon discharge from inpatient rehab are supervision to modified independent mobility with PT, supervision to modified independent ADLs with OT,  not applicable with SLP. 8. Estimated rehab length of stay to reach the above functional goals is: SNF/1-2 weeks 9. Does the patient have adequate social supports to accommodate these discharge functional goals? Potentially 10. Anticipated D/C setting: Home 11. Anticipated post D/C treatments: HH therapy 12. Overall Rehab/Functional Prognosis: good  RECOMMENDATIONS: This patient's condition is appropriate for continued rehabilitative care in the following setting: SNF Patient has agreed to participate in recommended program. Potentially Note that insurance prior authorization may be required for reimbursement for recommended care.  Comment:no medical complications to worn at CIR stay. If the patient does not progress over the next one to 2 days with his PT and OT may be a good candidate for SNF level rehabilitation    03/29/2012

## 2012-03-29 NOTE — Consult Note (Signed)
[name] is an 78 y.o. male.    PCP: Nelwyn Salisbury, MD    His neurologist is Dr. Lesia Sago His cardiologist is Dr. Alanda Amass   Reason for Consult: Management of coronary artery disease, and myasthenia gravis  Referring Physician: Marcene Corning  HPI: This is 78 year old, Caucasian male, with a past medical history of coronary artery disease, hypertension, myasthenia gravis, who underwent a right total knee replacement earlier today. Patient currently still under the effects of anesthesia and is unable to provide much history. He denies any significant pain in his recent right knee at this time. Denies any shortness of breath or chest pains. More history will need to be obtained when the patient is fully awake and alert.   Prior to Admission medications   Medication Sig Start Date End Date Taking? Authorizing Provider  calcium-vitamin D (OSCAL WITH D) 500-200 MG-UNIT per tablet Take 1 tablet by mouth 4 (four) times daily.    Yes Historical Provider, MD  clopidogrel (PLAVIX) 75 MG tablet Take 75 mg by mouth daily.   Yes Historical Provider, MD  HYDROcodone-acetaminophen (VICODIN) 5-500 MG per tablet Take 1 tablet by mouth every 6 (six) hours as needed for pain. 03/15/12  Yes Nelwyn Salisbury, MD  metoprolol succinate (TOPROL-XL) 50 MG 24 hr tablet Take 50 mg by mouth daily after breakfast. Take with or immediately following a meal.   Yes Historical Provider, MD  Multiple Vitamin (MULTIVITAMIN WITH MINERALS) TABS Take 1 tablet by mouth daily.   Yes Historical Provider, MD  mycophenolate (CELLCEPT) 500 MG tablet Take 500 mg by mouth daily.     Yes Historical Provider, MD  pantoprazole (PROTONIX) 40 MG tablet Take 40 mg by mouth 2 (two) times daily as needed. For upset stomach   Yes Historical Provider, MD  polyethylene glycol (MIRALAX / GLYCOLAX) packet Take 17 g by mouth daily as needed. For constipation   Yes Historical Provider, MD  predniSONE (DELTASONE) 2.5 MG tablet Take 7.5 mg  by mouth every other day.   Yes Historical Provider, MD  promethazine (PHENERGAN) 25 MG tablet Take 25 mg by mouth every 6 (six) hours as needed. For nausea   Yes Historical Provider, MD  pyridostigmine (MESTINON) 60 MG tablet Take 60 mg by mouth 4 (four) times daily.    Yes Historical Provider, MD  rosuvastatin (CRESTOR) 10 MG tablet Take 10 mg by mouth daily after breakfast.    Yes Historical Provider, MD  omega-3 acid ethyl esters (LOVAZA) 1 G capsule Take 2 g by mouth daily.    Historical Provider, MD    Allergies:  Allergies  Allergen Reactions  . Dicyclomine Other (See Comments)    REACTION: can worsen myasthenia gravis    Past Medical History  Diagnosis Date  . Coronary artery disease   . Diverticulitis   . GERD (gastroesophageal reflux disease)   . Benign prostatic hypertrophy   . Partial small bowel obstruction 04/16/10    resolved with bowel rest  . Hypertension     Dr. Alanda Amass- within a month or 2 to clear for surgery   . Shortness of breath   . Pulmonary embolus     2000  . Arthritis     back, hips, knees   . Cancer     h/o renal carcinoma   . Chronic kidney disease     h/o UTI-2012  . Myasthenia gravis   . Myasthenia gravis     Dr. Anne Hahn    Past Surgical  History  Procedure Date  . Total knee arthroplasty     both Sees Dr. Margreta Journey  . Nephrectomy     left  . Angioplasty     had 2 stent replacements  . Stented placed 1/08  . Colonoscopy 05/28/10    per Dr. Carman Ching, diverticulosis, no repeats planned   . Sp kyphoplasty     and vertebroplasty  . Cardiac catheterization     2008, stents   . Eye surgery     cataracts(bilateral)  removed, ?iol  . Tonsillectomy   . Joint replacement     2005-R, L knee replacement- 2000      Physical Examination Blood pressure 114/63, pulse 81, temperature 97.9 F (36.6 C), temperature source Oral, resp. rate 16, weight 124 kg (273 lb 5.9 oz), SpO2 94.00%.  General appearance: appears stated age, no  distress and slowed mentation Resp: clear to auscultation bilaterally Cardio: regular rate and rhythm, S1, S2 normal, no murmur, click, rub or gallop GI: soft, non-tender; bowel sounds normal; no masses,  no organomegaly Extremities: Right LE was not examined due to recent surgery. Other extremities normal, atraumatic, no cyanosis or edema Pulses: 2+ and symmetric Skin: Skin color, texture, turgor normal. No rashes or lesions    Assessment/Plan  Active Problems:  MYASTHENIA GRAVIS W/O (ACUTE) EXACERBATION  HYPERTENSION  CORONARY ARTERY DISEASE  GERD   #1 history of coronary artery disease: This issue appears to be stable. Patient denies any chest pain or shortness of breath. For this surgery by [facility]. Heart and vascular. He is known to have a normal ejection fraction. He's had a stent placed to the RCA in 2008, and has had low risk stress testing subsequently. Continue to monitor closely. Continue with his antiplatelet agents . #2 history of myasthenia gravis: Continue with Mestinon. Patient tells me that he is also on CellCept and prednisone for the same reason. In view the surgery we will give him a slightly higher dose of prednisone to deal with the surgical stress and quickly taper down to his usual home dose off of prednisone.   #3 history of hypertension: Continue to monitor. Blood pressures closely.  #4 history of GERD continue PPI.  He is a full code.  DVT, prophylaxis as per orthopedic surgeon.   We will follow this patient along with him while he is hospitalized.  [facility]  [facility] Pager [phone]  03/29/2012, 12:32 PM

## 2012-03-30 ENCOUNTER — Encounter (HOSPITAL_COMMUNITY): Payer: Self-pay | Admitting: Orthopaedic Surgery

## 2012-03-30 DIAGNOSIS — I2510 Atherosclerotic heart disease of native coronary artery without angina pectoris: Secondary | ICD-10-CM

## 2012-03-30 DIAGNOSIS — I10 Essential (primary) hypertension: Secondary | ICD-10-CM

## 2012-03-30 DIAGNOSIS — G7000 Myasthenia gravis without (acute) exacerbation: Secondary | ICD-10-CM

## 2012-03-30 DIAGNOSIS — K219 Gastro-esophageal reflux disease without esophagitis: Secondary | ICD-10-CM

## 2012-03-30 LAB — CBC
Platelets: 153 10*3/uL (ref 150–400)
RBC: 3.76 MIL/uL — ABNORMAL LOW (ref 4.22–5.81)
RDW: 13.8 % (ref 11.5–15.5)
WBC: 10.3 10*3/uL (ref 4.0–10.5)

## 2012-03-30 MED ORDER — SODIUM CHLORIDE 0.9 % IJ SOLN
3.0000 mL | Freq: Two times a day (BID) | INTRAMUSCULAR | Status: DC
  Administered 2012-03-30 (×2): 3 mL via INTRAVENOUS

## 2012-03-30 NOTE — Progress Notes (Addendum)
Physical Therapy Treatment Patient Details Name: [name] MRN: [id number] DOB: November 15, 1924 Today's Date: 03/30/2012 Time: 1191-4782 PT Time Calculation (min): 31 min  PT Assessment / Plan / Recommendation Comments on Treatment Session  Pt able to make great progress with mobility today but required significant amount of cues for increased safety awareness with activity.  Although pt made great gains with mobility he would con't to benefit from STR due to decreased safety awareness but unsure if impulsiveness is baseline or not.  Pt seems to be pretty adamant about returning home at d/c- if he does d/c directly home he will need HHPT.  Spoke with SW Re: pt's progress & d/c plans.  Pt would benefit from atleast 1-2 more days before d/cing home (if thats the plan) to ensure consistency with performance & maximize strength, safety, & independence with functional mobility.      Follow Up Recommendations  Skilled nursing facility;Home health PT;Supervision/Assistance - 24 hour    Barriers to Discharge        Equipment Recommendations  Rolling walker with 5" wheels;Other (comment) (if d/c's directly home)    Recommendations for Other Services    Frequency 7X/week   Plan Discharge plan needs to be updated    Precautions / Restrictions Precautions Precautions: Knee Required Braces or Orthoses: Knee Immobilizer - Right Knee Immobilizer - Right: Discontinue once straight leg raise with < 10 degree lag Restrictions Weight Bearing Restrictions: Yes RLE Weight Bearing: Weight bearing as tolerated       Mobility  Bed Mobility Bed Mobility: Supine to Sit Supine to Sit: 4: Min guard;HOB flat Sitting - Scoot to Edge of Bed: 4: Min guard Details for Bed Mobility Assistance: Cues for safety awareness of hips when coming to EOB.   Transfers Transfers: Sit to Stand;Stand to Sit Sit to Stand: 4: Min guard;With upper extremity assist;From bed Stand to Sit: 4: Min guard;With upper extremity  assist;With armrests;To chair/3-in-1 Details for Transfer Assistance: Cues for safe technique, hand placement, use of UE's to control descent, body positioning before sitting.   Ambulation/Gait Ambulation/Gait Assistance: 4: Min guard Ambulation Distance (Feet): 100 Feet Assistive device: Rolling walker Ambulation/Gait Assistance Details: Cues for safe use of RW, sequencing, body positioning inside RW, safety with turns, quad activation to prevent knee buckling- trialed ambulation without KI; no knee buckling noted.  Cues to slow down & take his time to increase safety.   Gait Pattern: Step-through pattern;Trunk flexed;Decreased hip/knee flexion - right Stairs: Yes Stairs Assistance: 4: Min assist Stairs Assistance Details (indicate cue type and reason): Max cues for sequencing, technique, & safety.  Pt impulsive.   Stair Management Technique: Two rails;Step to pattern;Forwards Number of Stairs: 4  Wheelchair Mobility Wheelchair Mobility: No    Exercises Total Joint Exercises Ankle Circles/Pumps: AROM;Both;10 reps Quad Sets: AROM;Both;10 reps Straight Leg Raises: AROM;Right;10 reps   PT Diagnosis:    PT Problem List:   PT Treatment Interventions:     PT Goals Acute Rehab PT Goals Time For Goal Achievement: 04/05/12 Potential to Achieve Goals: Good PT Goal: Supine/Side to Sit - Progress: Progressing toward goal PT Goal: Sit to Stand - Progress: Met PT Goal: Stand to Sit - Progress: Met PT Goal: Ambulate - Progress: Progressing toward goal PT Goal: Up/Down Stairs - Progress: Progressing toward goal PT Goal: Perform Home Exercise Program - Progress: Progressing toward goal  Visit Information  Last PT Received On: 03/30/12 Assistance Needed: +1    Subjective Data      Cognition  Overall Cognitive Status: Impaired Area of Impairment: Safety/judgement Arousal/Alertness: Awake/alert Orientation Level: Oriented X4 / Intact Behavior During Session: WFL for tasks  performed Safety/Judgement: Impulsive;Decreased safety judgement for tasks assessed Safety/Judgement - Other Comments: Pt required max safety cues throughout session.  Impulsive.   Cognition - Other Comments: ? If cognitive deficits are baseline.  No family to verify.      Balance  Balance Balance Assessed: No  End of Session PT - End of Session Equipment Utilized During Treatment: Gait belt Activity Tolerance: Patient tolerated treatment well Patient left: in chair;with call bell/phone within reach Nurse Communication: Mobility status CPM Right Knee CPM Right Knee: On Right Knee Flexion (Degrees): 60  Right Knee Extension (Degrees): 0    GP     Lara Mulch 03/30/2012, 12:04 PM  Verdell Face, PTA [phone] 03/30/2012

## 2012-03-30 NOTE — Progress Notes (Addendum)
Subjective: 2 Days Post-Op Procedure(s) (LRB): TOTAL KNEE REVISION (Right)  Activity level:  OOB with PT Diet tolerance:  regular Voiding:  well Patient reports pain as mild.    Objective: Vital signs in last 24 hours: Temp:  [97.9 F (36.6 C)-98.4 F (36.9 C)] 97.9 F (36.6 C) (06/27 1101) Pulse Rate:  [79-92] 81  (06/27 1101) Resp:  [16-20] 18  (06/27 1101) BP: (131-142)/(62-72) 131/62 mmHg (06/27 1101) SpO2:  [93 %-95 %] 95 % (06/27 1101)  Labs:  Basename 03/30/12 0450 03/29/12 0540  HGB 11.5* 11.6*    Basename 03/30/12 0450 03/29/12 0540  WBC 10.3 5.4  RBC 3.76* 3.68*  HCT 34.3* 34.5*  PLT 153 152    Basename 03/29/12 0540  NA 139  K 3.8  CL 105  CO2 26  BUN 19  CREATININE 0.98  GLUCOSE 124*  CALCIUM 8.5   No results found for this basename: LABPT:2,INR:2 in the last 72 hours  Physical Exam:  ABD soft Neurovascular intact Sensation intact distally Intact pulses distally Dorsiflexion/Plantar flexion intact Incision: no drainage No cellulitis present  Assessment/Plan:  2 Days Post-Op Procedure(s) (LRB): TOTAL KNEE REVISION (Right)  Dressing changed and drain pulled Up with therapy D/C IV fluids Plan for discharge tomorrow PT feels SNF best and I would agree a week there would be safest Will go on ASA 325 bid for DVT prophylaxis    Ranell Skibinski G 03/30/2012, 11:06 AM

## 2012-03-30 NOTE — Consult Note (Signed)
[name] is an 78 y.o. male.    PCP: Nelwyn Salisbury, MD    His neurologist is Dr. Lesia Sago His cardiologist is Dr. Alanda Amass   Reason for Consult: Management of coronary artery disease, and myasthenia gravis  Referring Physician: Marcene Corning  HPI: This is 78 year old, Caucasian male, with a past medical history of coronary artery disease, hypertension, myasthenia gravis, who underwent a right total knee replacement earlier today. Patient currently still under the effects of anesthesia and is unable to provide much history. He denies any significant pain in his recent right knee at this time. Denies any shortness of breath or chest pains. More history will need to be obtained when the patient is fully awake and alert.   Prior to Admission medications   Medication Sig Start Date End Date Taking? Authorizing Provider  calcium-vitamin D (OSCAL WITH D) 500-200 MG-UNIT per tablet Take 1 tablet by mouth 4 (four) times daily.    Yes Historical Provider, MD  clopidogrel (PLAVIX) 75 MG tablet Take 75 mg by mouth daily.   Yes Historical Provider, MD  HYDROcodone-acetaminophen (VICODIN) 5-500 MG per tablet Take 1 tablet by mouth every 6 (six) hours as needed for pain. 03/15/12  Yes Nelwyn Salisbury, MD  metoprolol succinate (TOPROL-XL) 50 MG 24 hr tablet Take 50 mg by mouth daily after breakfast. Take with or immediately following a meal.   Yes Historical Provider, MD  Multiple Vitamin (MULTIVITAMIN WITH MINERALS) TABS Take 1 tablet by mouth daily.   Yes Historical Provider, MD  mycophenolate (CELLCEPT) 500 MG tablet Take 500 mg by mouth daily.     Yes Historical Provider, MD  pantoprazole (PROTONIX) 40 MG tablet Take 40 mg by mouth 2 (two) times daily as needed. For upset stomach   Yes Historical Provider, MD  polyethylene glycol (MIRALAX / GLYCOLAX) packet Take 17 g by mouth daily as needed. For constipation   Yes Historical Provider, MD  predniSONE (DELTASONE) 2.5 MG tablet Take 7.5 mg  by mouth every other day.   Yes Historical Provider, MD  promethazine (PHENERGAN) 25 MG tablet Take 25 mg by mouth every 6 (six) hours as needed. For nausea   Yes Historical Provider, MD  pyridostigmine (MESTINON) 60 MG tablet Take 60 mg by mouth 4 (four) times daily.    Yes Historical Provider, MD  rosuvastatin (CRESTOR) 10 MG tablet Take 10 mg by mouth daily after breakfast.    Yes Historical Provider, MD  omega-3 acid ethyl esters (LOVAZA) 1 G capsule Take 2 g by mouth daily.    Historical Provider, MD    Allergies:  Allergies  Allergen Reactions  . Dicyclomine Other (See Comments)    REACTION: can worsen myasthenia gravis    Past Medical History  Diagnosis Date  . Coronary artery disease   . Diverticulitis   . GERD (gastroesophageal reflux disease)   . Benign prostatic hypertrophy   . Partial small bowel obstruction 04/16/10    resolved with bowel rest  . Hypertension     Dr. Alanda Amass- within a month or 2 to clear for surgery   . Shortness of breath   . Pulmonary embolus     2000  . Arthritis     back, hips, knees   . Cancer     h/o renal carcinoma   . Chronic kidney disease     h/o UTI-2012  . Myasthenia gravis   . Myasthenia gravis     Dr. Anne Hahn    Past Surgical  History  Procedure Date  . Total knee arthroplasty     both Sees Dr. Margreta Journey  . Nephrectomy     left  . Angioplasty     had 2 stent replacements  . Stented placed 1/08  . Colonoscopy 05/28/10    per Dr. Carman Ching, diverticulosis, no repeats planned   . Sp kyphoplasty     and vertebroplasty  . Cardiac catheterization     2008, stents   . Eye surgery     cataracts(bilateral)  removed, ?iol  . Tonsillectomy   . Joint replacement     2005-R, L knee replacement- 2000  . Total knee revision 03/28/2012    Procedure: TOTAL KNEE REVISION;  Surgeon: Velna Ochs, MD;  Location: [facility] OR;  Service: Orthopedics;  Laterality: Right;      Physical Examination Blood pressure 131/62, pulse 81,  temperature 97.9 F (36.6 C), temperature source Oral, resp. rate 18, weight 124 kg (273 lb 5.9 oz), SpO2 95.00%.  General appearance: appears stated age, no distress and slowed mentation Resp: clear to auscultation bilaterally Cardio: regular rate and rhythm, S1, S2 normal, no murmur, click, rub or gallop GI: soft, non-tender; bowel sounds normal; no masses,  no organomegaly Extremities: Right LE was not examined due to recent surgery. Other extremities normal, atraumatic, no cyanosis or edema Pulses: 2+ and symmetric Skin: Skin color, texture, turgor normal. No rashes or lesions    Assessment/Plan  Active Problems:  MYASTHENIA GRAVIS W/O (ACUTE) EXACERBATION  HYPERTENSION  CORONARY ARTERY DISEASE  GERD   #1 history of coronary artery disease: This issue appears to be stable. Patient denies any chest pain or shortness of breath. For this surgery by [facility]. Heart and vascular. He is known to have a normal ejection fraction. He's had a stent placed to the RCA in 2008, and has had low risk stress testing subsequently. Continue to monitor closely. Continue with his antiplatelet agents . #2 history of myasthenia gravis: Continue with Mestinon. Patient tells me that he is also on CellCept and prednisone for the same reason.on tapering dose of prednisone.  #3 history of hypertension:  Better controlled. Continue to monitor. Blood pressures closely.  #4 history of GERD continue PPI.  He is a full code.  DVT, prophylaxis as per orthopedic surgeon.   We will follow this patient along with him while he is hospitalized.  [facility]  [facility] Pager [phone]  03/30/2012, 6:26 PM

## 2012-03-30 NOTE — Care Management Note (Signed)
    Page 1 of 1   03/30/2012     10:49:15 AM   CARE MANAGEMENT NOTE 03/30/2012  Patient:  [name], [name]   Account Number:  [id number]  Date Initiated:  03/30/2012  Documentation initiated by:  Anette Guarneri  Subjective/Objective Assessment:   POD#1 s/p TKA  Lives at home with wife  PT recommends SNF  Patient perfers home with [facility] services  has DME per patient     Action/Plan:   Home with [facility] services vs SNF   Anticipated DC Date:  03/31/2012   Anticipated DC Plan:  HOME W HOME HEALTH SERVICES  In-house referral  Clinical Social Worker      DC Planning Services  CM consult      Choice offered to / List presented to:             Status of service:  In process, will continue to follow Medicare Important Message given?   (If response is "NO", the following Medicare IM given date fields will be blank) Date Medicare IM given:   Date Additional Medicare IM given:    Discharge Disposition:    Per UR Regulation:  Reviewed for med. necessity/level of care/duration of stay  If discussed at Long Length of Stay Meetings, dates discussed:    Comments:  03/30/12 10:43  Anette Guarneri RN/CM Spoke with patient regarding d/c needs. PT recommends SNF but patient does not want SNF, perfers home with [facility] services. Per patient he has all DME. [employer] to deliver CPM CM and CSW/Donna Craddock with continue to follow progression

## 2012-03-30 NOTE — Progress Notes (Signed)
Agree with PTA.    Jehieli Brassell, PT [phone]  

## 2012-03-30 NOTE — Clinical Social Work Placement (Addendum)
    Clinical Social Work Department CLINICAL SOCIAL WORK PLACEMENT NOTE 03/30/2012  Patient:  KAMONTE, MCMICHEN  Account Number:  [id number] Admit date:  03/28/2012  Clinical Social Worker:  Lupita Leash Imya Mance, BSW  Date/time:  03/30/2012 05:14 PM  Clinical Social Work is seeking post-discharge placement for this patient at the following level of care:   SKILLED NURSING   (*CSW will update this form in Epic as items are completed)   03/30/2012  Patient/family provided with [facility] Department of Clinical Social Work's list of facilities offering this level of care within the geographic area requested by the patient (or if unable, by the patient's family).  03/30/2012  Patient/family informed of their freedom to choose among providers that offer the needed level of care, that participate in Medicare, Medicaid or managed care program needed by the patient, have an available bed and are willing to accept the patient.  03/30/2012  Patient/family informed of [facility]' ownership interest in [facility], as well as of the fact that they are under no obligation to receive care at this facility.  PASARR submitted to EDS on 03/30/2012 PASARR number received from EDS on 04/01/2011  FL2 transmitted to all facilities in geographic area requested by pt/family on  03/30/2012 FL2 transmitted to all facilities within larger geographic area on   Patient informed that his/her managed care company has contracts with or will negotiate with  certain facilities, including the following:   NA- patient has Medicare  Patient/family informed of bed offers received:   Patient chooses bed at  Physician recommends and patient chooses bed at    Patient to be transferred to  on  03/31/2012 Patient to be transferred to facility by [facility]  The following physician request were entered in Epic:   Additional Comments: Patient and wife are all agreeable to short term SNF- patient states "I'll go for 1 week  only." Notified SNF, patient's wife and nursing.    Lorri Frederick. [place]  [phone]

## 2012-03-30 NOTE — Evaluation (Signed)
Occupational Therapy Evaluation Patient Details Name: [name] MRN: [id number] DOB: 03/07/1925 Today's Date: 03/30/2012 Time: 1478-2956 OT Time Calculation (min): 34 min  OT Assessment / Plan / Recommendation Clinical Impression  Pt s/p Rt TKA and presents with pain, weakness, decreased rt knee ROM as well as decreased safety awareness thereby limiting pt's I with ADL. Pt will benefit from skilled OT in the acute setting to maximize I with ADL and ADL mobility prior to d/c. Currently, pt unsafe to return home but is refusing SNF. Will continue to assess    OT Assessment  Patient needs continued OT Services    Follow Up Recommendations  Skilled nursing facility (vs HHOT with 24hr supervision pending progress)    Barriers to Discharge Decreased caregiver support    Equipment Recommendations  None recommended by OT    Recommendations for Other Services    Frequency  Min 2X/week    Precautions / Restrictions Precautions Precautions: Knee Required Braces or Orthoses: Knee Immobilizer - Right Knee Immobilizer - Right: Discontinue once straight leg raise with < 10 degree lag (achieved on 6/27) Restrictions Weight Bearing Restrictions: Yes RLE Weight Bearing: Weight bearing as tolerated   Pertinent Vitals/Pain Pt reports 3/10 rt knee pain. Pre-medicated     ADL  Eating/Feeding: Simulated;Independent Where Assessed - Eating/Feeding: Chair Grooming: Performed;Min guard Where Assessed - Grooming: Unsupported standing Lower Body Bathing: Simulated;Minimal assistance Where Assessed - Lower Body Bathing: Supported sit to stand Upper Body Dressing: Performed;Set up Where Assessed - Upper Body Dressing: Unsupported sitting Lower Body Dressing: Performed;Moderate assistance Where Assessed - Lower Body Dressing: Unsupported sitting Toilet Transfer: Min guard;Simulated Toilet Transfer Method: Sit to Barista: Bedside commode Toileting - Clothing  Manipulation and Hygiene: Simulated;Minimal assistance Where Assessed - Engineer, mining and Hygiene: Standing Tub/Shower Transfer: Performed;Minimal assistance Tub/Shower Transfer Method: Ambulating (sideways entry) Psychologist, educational: Shower seat without back Equipment Used: Gait belt;Rolling walker Transfers/Ambulation Related to ADLs: Pt Min guard A with RW ambulation. ADL Comments: Pt impulsive/with decreased safety awareness during functional activities (especially tub transfer). Pt able to doff/don sock on left foot but unable to reach on right- states his wife can help him    OT Diagnosis: Generalized weakness;Acute pain  OT Problem List: Decreased range of motion;Decreased activity tolerance;Decreased knowledge of use of DME or AE;Decreased safety awareness;Pain OT Treatment Interventions: Self-care/ADL training;DME and/or AE instruction;Therapeutic activities;Patient/family education;Balance training   OT Goals Acute Rehab OT Goals OT Goal Formulation: With patient Time For Goal Achievement: 04/06/12 Potential to Achieve Goals: Good ADL Goals Pt Will Perform Grooming: with modified independence;Standing at sink ADL Goal: Grooming - Progress: Goal set today Pt Will Perform Lower Body Bathing: with supervision;Sit to stand in shower ADL Goal: Lower Body Bathing - Progress: Goal set today Pt Will Perform Lower Body Dressing: with min assist;Sit to stand from chair;Sit to stand from bed ADL Goal: Lower Body Dressing - Progress: Goal set today Pt Will Transfer to Toilet: with modified independence;Ambulation;3-in-1 ADL Goal: Toilet Transfer - Progress: Goal set today Pt Will Perform Toileting - Clothing Manipulation: with modified independence;Standing ADL Goal: Toileting - Clothing Manipulation - Progress: Goal set today Pt Will Perform Tub/Shower Transfer: Tub transfer;with supervision;with DME;Grab bars;Shower seat without back ADL Goal: Glass blower/designer - Progress: Goal set today  Visit Information  Last OT Received On: 03/30/12 Assistance Needed: +1 PT/OT Co-Evaluation/Treatment: Yes    Subjective Data  Subjective: I'm doing better today Patient Stated Goal: Return home with family   Prior  Functioning  Home Living Lives With: Spouse Available Help at Discharge: Family;Friend(s);Neighbor;Available 24 hours/day Type of Home: House Home Access: Stairs to enter [employer] of Steps: 6 Entrance Stairs-Rails: Right;Left;Can reach both Home Layout: Two level;Able to live on main level with bedroom/bathroom Bathroom Shower/Tub: Engineer, manufacturing systems: Standard Bathroom Accessibility: Yes How Accessible: Accessible via walker Home Adaptive Equipment: Shower chair without back;Bedside commode/3-in-1 Prior Function Level of Independence: Independent Able to Take Stairs?: Yes Driving: Yes Communication Communication: No difficulties Dominant Hand: Right    Cognition  Arousal/Alertness: Awake/alert Orientation Level: Oriented X4 / Intact Safety/Judgement: Decreased safety judgement for tasks assessed;Decreased awareness of need for assistance    Extremity/Trunk Assessment Right Upper Extremity Assessment RUE ROM/Strength/Tone: Within functional levels Left Upper Extremity Assessment LUE ROM/Strength/Tone: Within functional levels   Mobility Transfers Sit to Stand: 4: Min guard;From bed Stand to Sit: 4: Min guard;With armrests;To chair/3-in-1 Details for Transfer Assistance: VC for hand placement and cues to square up with chair prior to sitting   Exercise    Balance    End of Session OT - End of Session Equipment Utilized During Treatment: Gait belt Activity Tolerance: Patient tolerated treatment well Patient left: in chair;with call bell/phone within reach Nurse Communication: Mobility status  GO     Brendan Gadson 03/30/2012, 10:04 AM

## 2012-03-30 NOTE — Progress Notes (Signed)
Late entry for 03/29/12. Per Rehab MD consult on 03/29/12, patient lacks medical complexity that is required for CIR admit. Discussed this with patient at bedside. Not sure that patient fully understands due to still being slightly sleepy. I did discuss with MSW. CIR signing off at this time. Toni Amend adm coordinator [phone].

## 2012-03-30 NOTE — Clinical Social Work Psychosocial (Signed)
     Clinical Social Work Department BRIEF PSYCHOSOCIAL ASSESSMENT 03/30/2012  Patient:  [name], [name]     Account Number:  [id number]     Admit date:  03/28/2012  Clinical Social Worker:  Burnard Hawthorne  Date/Time:  03/30/2012 05:01 PM  Referred by:  Physician  Date Referred:  03/30/2012 Referred for  SNF Placement   Other Referral:   Interview type:  Patient Other interview type:   Spoke with patient's brother Sherilyn Cooter later in afternoon per OK of patient. He stated that he would talk to his wife tonight and keep her notified of dc plan.    PSYCHOSOCIAL DATA Living Status:  WIFE Admitted from facility:   Level of care:   Primary support name:  Tucker Minter  [phone] Primary support relationship to patient:  SPOUSE Degree of support available:   Supportive    Sister : Lauro Regulus  [phone]    CURRENT CONCERNS Current Concerns  Post-Acute Placement   Other Concerns:    SOCIAL WORK ASSESSMENT / PLAN CSW referred to see patient for short term SNF for rehab. During intial visit, patiient declined offer of SNF despite recommendation of PT. After talking with Dr. Jerl Santos- he now agrees to a 1 week stay (ONLY) in a SNF. Bed search process discussed; patient requests [facility].  Spoke with Al Decant- she stated that she may not have a vacancy but will look at referral.  Fl2 generated and faxed to [facility]   Assessment/plan status:  Psychosocial Support/Ongoing Assessment of Needs Other assessment/ plan:   Information/referral to community resources:   SNF  bed list provided    PATIENTS/FAMILYS RESPONSE TO PLAN OF CARE: Patient reluctantly agrees to short term SNF and will only agree to 1 week in SNF then states he will return home. His wife and family support SNF per MD and patient.

## 2012-03-31 DIAGNOSIS — T84012A Broken internal right knee prosthesis, initial encounter: Secondary | ICD-10-CM

## 2012-03-31 DIAGNOSIS — G7000 Myasthenia gravis without (acute) exacerbation: Secondary | ICD-10-CM

## 2012-03-31 DIAGNOSIS — I2510 Atherosclerotic heart disease of native coronary artery without angina pectoris: Secondary | ICD-10-CM

## 2012-03-31 DIAGNOSIS — K219 Gastro-esophageal reflux disease without esophagitis: Secondary | ICD-10-CM

## 2012-03-31 DIAGNOSIS — I10 Essential (primary) hypertension: Secondary | ICD-10-CM

## 2012-03-31 LAB — CBC
Hemoglobin: 11.2 g/dL — ABNORMAL LOW (ref 13.0–17.0)
RBC: 3.61 MIL/uL — ABNORMAL LOW (ref 4.22–5.81)
WBC: 9.9 10*3/uL (ref 4.0–10.5)

## 2012-03-31 MED ORDER — PREDNISONE 10 MG PO TABS: 10.0000 mg | ORAL_TABLET | Freq: Every day | ORAL | Status: DC

## 2012-03-31 MED ORDER — ASPIRIN 325 MG PO TBEC: 325.0000 mg | DELAYED_RELEASE_TABLET | Freq: Two times a day (BID) | ORAL | Status: AC

## 2012-03-31 MED ORDER — HYDROCODONE-ACETAMINOPHEN 5-325 MG PO TABS: 1.0000 | ORAL_TABLET | ORAL | Status: AC | PRN

## 2012-03-31 MED ORDER — PREDNISONE 2.5 MG PO TABS: 7.5000 mg | ORAL_TABLET | Freq: Every day | ORAL | Status: AC

## 2012-03-31 MED ORDER — PREDNISONE 20 MG PO TABS: 40.0000 mg | ORAL_TABLET | Freq: Two times a day (BID) | ORAL | Status: AC

## 2012-03-31 MED ORDER — PREDNISONE 20 MG PO TABS: 40.0000 mg | ORAL_TABLET | Freq: Every day | ORAL | Status: AC

## 2012-03-31 MED ORDER — PREDNISONE 2.5 MG PO TABS: 7.5000 mg | ORAL_TABLET | ORAL | Status: DC

## 2012-03-31 MED ORDER — PREDNISONE 20 MG PO TABS: 20.0000 mg | ORAL_TABLET | Freq: Every day | ORAL | Status: AC

## 2012-03-31 NOTE — Progress Notes (Signed)
Subjective: 3 Days Post-Op Procedure(s) (LRB): TOTAL KNEE REVISION (Right) Patient reports pain as 2 on 0-10 scale.    Objective: Vital signs in last 24 hours: Temp:  [98.4 F (36.9 C)-98.7 F (37.1 C)] 98.4 F (36.9 C) (06/28 0605) Pulse Rate:  [79-80] 79  (06/28 0924) Resp:  [18] 18  (06/28 0605) BP: (116-139)/(53-74) 116/53 mmHg (06/28 0924) SpO2:  [94 %-97 %] 97 % (06/28 0605)  Intake/Output from previous day: 06/27 0701 - 06/28 0700 In: 840 [P.O.:840] Out: 1700 [Urine:1700] Intake/Output this shift:     Basename 03/31/12 0635 03/30/12 0450 03/29/12 0540  HGB 11.2* 11.5* 11.6*    Basename 03/31/12 0635 03/30/12 0450  WBC 9.9 10.3  RBC 3.61* 3.76*  HCT 33.1* 34.3*  PLT 155 153    Basename 03/29/12 0540  NA 139  K 3.8  CL 105  CO2 26  BUN 19  CREATININE 0.98  GLUCOSE 124*  CALCIUM 8.5   No results found for this basename: LABPT:2,INR:2 in the last 72 hours Right knee exam: Neurologically intact Sensation intact distally Intact pulses distally Dorsiflexion/Plantar flexion intact Incision: dressing C/D/I Compartment soft  Assessment/Plan: 3 Days Post-Op Procedure(s) (LRB): TOTAL KNEE REVISION (Right) Discharge to SNF F/U Dr Jerl Santos in 2 weeks. Ellamarie Naeve G 03/31/2012, 11:11 AM

## 2012-03-31 NOTE — Progress Notes (Signed)
Occupational Therapy Treatment Patient Details Name: [name] MRN: [id number] DOB: 06-16-1925 Today's Date: 03/31/2012 Time: 1202-1226 OT Time Calculation (min): 24 min  OT Assessment / Plan / Recommendation Comments on Treatment Session Pt progressing with therapy but continues with decreased safety awareness    Follow Up Recommendations  Skilled nursing facility    Barriers to Discharge       Equipment Recommendations  Defer to next venue    Recommendations for Other Services    Frequency     Plan Discharge plan remains appropriate    Precautions / Restrictions Precautions Precautions: Knee Required Braces or Orthoses: Knee Immobilizer - Right Knee Immobilizer - Right: Discontinue once straight leg raise with < 10 degree lag Restrictions RLE Weight Bearing: Weight bearing as tolerated   Pertinent Vitals/Pain Pt denies any pain at this time. Pt with DOE and attributes this to medications he has been taking. Pt's 02 sats=95% during ambulation    ADL  Grooming: Performed;Wash/dry hands;Supervision/safety Where Assessed - Grooming: Unsupported standing Lower Body Dressing: Performed;Minimal assistance Where Assessed - Lower Body Dressing: Unsupported sit to stand Toilet Transfer: Performed;Supervision/safety Toilet Transfer Method: Sit to Barista: Bedside commode Toileting - Clothing Manipulation and Hygiene: Performed;Min guard Where Assessed - Engineer, mining and Hygiene: Standing Equipment Used: Gait belt;Rolling walker Transfers/Ambulation Related to ADLs: Pt Supervision with ambulation but Min guard A with turns and backing to seating for safety ADL Comments: Pt progressing with therapy but continues with some decreased safety.     OT Diagnosis:    OT Problem List:   OT Treatment Interventions:     OT Goals ADL Goals ADL Goal: Grooming - Progress: Progressing toward goals ADL Goal: Lower Body Dressing - Progress:  Met ADL Goal: Toilet Transfer - Progress: Progressing toward goals ADL Goal: Toileting - Clothing Manipulation - Progress: Progressing toward goals  Visit Information  Last OT Received On: 03/31/12 Assistance Needed: +1    Subjective Data      Prior Functioning       Cognition  Overall Cognitive Status: Impaired Area of Impairment: Safety/judgement Arousal/Alertness: Awake/alert Orientation Level: Oriented X4 / Intact Behavior During Session: WFL for tasks performed Safety/Judgement: Decreased safety judgement for tasks assessed Safety/Judgement - Other Comments: decreased impulsiveness this sesison but still present.  Pt stating he would be fine to get by himself to ambulate to bathroom.      Mobility Bed Mobility Bed Mobility: Not assessed Sit to Stand: 5: Supervision;With armrests;From chair/3-in-1 Stand to Sit: To chair/3-in-1;With armrests;4:  Details for Transfer Assistance: Cues for safety, to ensure RW positioning, & body positioning before sitting.     Exercises   Balance Balance Balance Assessed: No  End of Session OT - End of Session Equipment Utilized During Treatment: Gait belt Activity Tolerance: Patient tolerated treatment well Patient left: in chair;with call bell/phone within reach   GO     [name] 03/31/2012, 2:24 PM

## 2012-03-31 NOTE — Consult Note (Signed)
[name] is an 78 y.o. male.    PCP: Nelwyn Salisbury, MD    His neurologist is Dr. Lesia Sago His cardiologist is Dr. Alanda Amass   Reason for Consult: Management of coronary artery disease, and myasthenia gravis  Referring Physician: Marcene Corning  HPI: This is 78 year old, Caucasian male, with a past medical history of coronary artery disease, hypertension, myasthenia gravis, who underwent a right total knee replacement earlier today. Patient currently still under the effects of anesthesia and is unable to provide much history. He denies any significant pain in his recent right knee at this time. Denies any shortness of breath or chest pains. More history will need to be obtained when the patient is fully awake and alert.   Prior to Admission medications   Medication Sig Start Date End Date Taking? Authorizing Provider  calcium-vitamin D (OSCAL WITH D) 500-200 MG-UNIT per tablet Take 1 tablet by mouth 4 (four) times daily.    Yes Historical Provider, MD  clopidogrel (PLAVIX) 75 MG tablet Take 75 mg by mouth daily.   Yes Historical Provider, MD  HYDROcodone-acetaminophen (VICODIN) 5-500 MG per tablet Take 1 tablet by mouth every 6 (six) hours as needed for pain. 03/15/12  Yes Nelwyn Salisbury, MD  metoprolol succinate (TOPROL-XL) 50 MG 24 hr tablet Take 50 mg by mouth daily after breakfast. Take with or immediately following a meal.   Yes Historical Provider, MD  Multiple Vitamin (MULTIVITAMIN WITH MINERALS) TABS Take 1 tablet by mouth daily.   Yes Historical Provider, MD  mycophenolate (CELLCEPT) 500 MG tablet Take 500 mg by mouth daily.     Yes Historical Provider, MD  pantoprazole (PROTONIX) 40 MG tablet Take 40 mg by mouth 2 (two) times daily as needed. For upset stomach   Yes Historical Provider, MD  polyethylene glycol (MIRALAX / GLYCOLAX) packet Take 17 g by mouth daily as needed. For constipation   Yes Historical Provider, MD  predniSONE (DELTASONE) 2.5 MG tablet Take 7.5 mg  by mouth every other day.   Yes Historical Provider, MD  promethazine (PHENERGAN) 25 MG tablet Take 25 mg by mouth every 6 (six) hours as needed. For nausea   Yes Historical Provider, MD  pyridostigmine (MESTINON) 60 MG tablet Take 60 mg by mouth 4 (four) times daily.    Yes Historical Provider, MD  rosuvastatin (CRESTOR) 10 MG tablet Take 10 mg by mouth daily after breakfast.    Yes Historical Provider, MD  omega-3 acid ethyl esters (LOVAZA) 1 G capsule Take 2 g by mouth daily.    Historical Provider, MD    Allergies:  Allergies  Allergen Reactions  . Dicyclomine Other (See Comments)    REACTION: can worsen myasthenia gravis    Past Medical History  Diagnosis Date  . Coronary artery disease   . Diverticulitis   . GERD (gastroesophageal reflux disease)   . Benign prostatic hypertrophy   . Partial small bowel obstruction 04/16/10    resolved with bowel rest  . Hypertension     Dr. Alanda Amass- within a month or 2 to clear for surgery   . Shortness of breath   . Pulmonary embolus     2000  . Arthritis     back, hips, knees   . Cancer     h/o renal carcinoma   . Chronic kidney disease     h/o UTI-2012  . Myasthenia gravis   . Myasthenia gravis     Dr. Anne Hahn    Past Surgical  History  Procedure Date  . Total knee arthroplasty     both Sees Dr. Margreta Journey  . Nephrectomy     left  . Angioplasty     had 2 stent replacements  . Stented placed 1/08  . Colonoscopy 05/28/10    per Dr. Carman Ching, diverticulosis, no repeats planned   . Sp kyphoplasty     and vertebroplasty  . Cardiac catheterization     2008, stents   . Eye surgery     cataracts(bilateral)  removed, ?iol  . Tonsillectomy   . Joint replacement     2005-R, L knee replacement- 2000  . Total knee revision 03/28/2012    Procedure: TOTAL KNEE REVISION;  Surgeon: Velna Ochs, MD;  Location: [facility] OR;  Service: Orthopedics;  Laterality: Right;      Physical Examination Blood pressure 116/53, pulse 79,  temperature 98.4 F (36.9 C), temperature source Oral, resp. rate 18, weight 124 kg (273 lb 5.9 oz), SpO2 97.00%.  General appearance: appears stated age, no distress and slowed mentation Resp: clear to auscultation bilaterally Cardio: regular rate and rhythm, S1, S2 normal, no murmur, click, rub or gallop GI: soft, non-tender; bowel sounds normal; no masses,  no organomegaly Extremities: Right LE was not examined due to recent surgery. Other extremities normal, atraumatic, no cyanosis or edema Pulses: 2+ and symmetric Skin: Skin color, texture, turgor normal. No rashes or lesions    Assessment/Plan  Active Problems:  MYASTHENIA GRAVIS W/O (ACUTE) EXACERBATION  HYPERTENSION  CORONARY ARTERY DISEASE  GERD   #1 history of coronary artery disease: This issue appears to be stable. Patient denies any chest pain or shortness of breath. For this surgery by [facility]. Heart and vascular. He is known to have a normal ejection fraction. He's had a stent placed to the RCA in 2008, and has had low risk stress testing subsequently. Continue to monitor closely. Continue with his antiplatelet agents . #2 history of myasthenia gravis: Continue with Mestinon. Patient tells me that he is also on CellCept and prednisone for the same reason.on tapering dose of prednisone currently.   #3 history of hypertension:  Better controlled. Continue to monitor. Blood pressures closely.  #4 history of GERD continue PPI.  He is a full code.  DVT, prophylaxis as per orthopedic surgeon.   We will follow this patient along with him while he is hospitalized.  [facility]  [facility] Pager [phone]  03/31/2012, 10:21 AM

## 2012-03-31 NOTE — Discharge Summary (Signed)
Patient ID: [name] MRN: [id number] DOB/AGE: [birth year] 78 y.o.  Admit date: 03/28/2012 Discharge date: 03/31/2012  Admission Diagnoses:  Principal Problem:  *Failed total knee, right Active Problems:  MYASTHENIA GRAVIS W/O (ACUTE) EXACERBATION  HYPERTENSION  CORONARY ARTERY DISEASE  GERD   Discharge Diagnoses:  Same  Past Medical History  Diagnosis Date  . Coronary artery disease   . Diverticulitis   . GERD (gastroesophageal reflux disease)   . Benign prostatic hypertrophy   . Partial small bowel obstruction 04/16/10    resolved with bowel rest  . Hypertension     Dr. Alanda Amass- within a month or 2 to clear for surgery   . Shortness of breath   . Pulmonary embolus     2000  . Arthritis     back, hips, knees   . Cancer     h/o renal carcinoma   . Chronic kidney disease     h/o UTI-2012  . Myasthenia gravis   . Myasthenia gravis     Dr. Anne Hahn    Surgeries: Procedure(s):Right TOTAL KNEE REVISION on 03/28/2012  Consult: Hospitalist for management of myasthenia gravis.  Discharged Condition: Improved  Hospital Course: [name] is an 78 y.o. male who was admitted 03/28/2012 for operative treatment ofFailed total knee, right. Patient has severe unremitting pain that affects sleep, daily activities, and work/hobbies. After pre-op clearance the patient was taken to the operating room on 03/28/2012 and underwent  Procedure(s): TOTAL KNEE REVISION.    Patient was given perioperative antibiotics: Anti-infectives     Start     Dose/Rate Route Frequency Ordered Stop   03/28/12 1830   ceFAZolin (ANCEF) IVPB 2 g/50 mL premix        2 g 100 mL/hr over 30 Minutes Intravenous Every 6 hours 03/28/12 1628 03/29/12 0200   03/28/12 1355   cefUROXime (ZINACEF) injection  Status:  Discontinued          As needed 03/28/12 1355 03/28/12 1500   03/27/12 1437   ceFAZolin (ANCEF) IVPB 2 g/50 mL premix        2 g 100 mL/hr over 30 Minutes Intravenous 60 min pre-op 03/27/12  1437 03/28/12 1250           Patient was given sequential compression devices, early ambulation, and chemoprophylaxis to prevent DVT.  Patient benefited maximally from hospital stay and there were no complications.    Recent vital signs: Patient Vitals for the past 24 hrs:  BP Temp Temp src Pulse Resp SpO2  03/31/12 0924 116/53 mmHg - - 79  - -  03/31/12 0605 134/74 mmHg 98.4 F (36.9 C) - 80  18  97 %  2012-04-13 2137 139/60 mmHg 98.7 F (37.1 C) Oral 79  18  94 %     Recent laboratory studies:  Basename 03/31/12 0635 04/13/2012 0450 03/29/12 0540  WBC 9.9 10.3 --  HGB 11.2* 11.5* --  HCT 33.1* 34.3* --  PLT 155 153 --  NA -- -- 139  K -- -- 3.8  CL -- -- 105  CO2 -- -- 26  BUN -- -- 19  CREATININE -- -- 0.98  GLUCOSE -- -- 124*  INR -- -- --  CALCIUM -- -- 8.5     Discharge Medications:   Medication List  As of 03/31/2012 11:15 AM   TAKE these medications         aspirin 325 MG EC tablet   Take 1 tablet (325 mg total) by mouth 2 (two)  times daily.      calcium-vitamin D 500-200 MG-UNIT per tablet   Commonly known as: OSCAL WITH D   Take 1 tablet by mouth 4 (four) times daily.      clopidogrel 75 MG tablet   Commonly known as: PLAVIX   Take 75 mg by mouth daily.      HYDROcodone-acetaminophen 5-500 MG per tablet   Commonly known as: VICODIN   Take 1 tablet by mouth every 6 (six) hours as needed for pain.      HYDROcodone-acetaminophen 5-325 MG per tablet   Commonly known as: NORCO   Take 1-2 tablets by mouth every 4 (four) hours as needed.      metoprolol succinate 50 MG 24 hr tablet   Commonly known as: TOPROL-XL   Take 50 mg by mouth daily after breakfast. Take with or immediately following a meal.      multivitamin with minerals Tabs   Take 1 tablet by mouth daily.      mycophenolate 500 MG tablet   Commonly known as: CELLCEPT   Take 500 mg by mouth daily.      omega-3 acid ethyl esters 1 G capsule   Commonly known as: LOVAZA   Take 2 g by mouth  daily.      pantoprazole 40 MG tablet   Commonly known as: PROTONIX   Take 40 mg by mouth 2 (two) times daily as needed. For upset stomach      polyethylene glycol packet   Commonly known as: MIRALAX / GLYCOLAX   Take 17 g by mouth daily as needed. For constipation      predniSONE 20 MG tablet   Commonly known as: DELTASONE   Take 2 tablets (40 mg total) by mouth 2 (two) times daily with a meal.      predniSONE 20 MG tablet   Commonly known as: DELTASONE   Take 2 tablets (40 mg total) by mouth daily with breakfast.      predniSONE 2.5 MG tablet   Commonly known as: DELTASONE   Take 3 tablets (7.5 mg total) by mouth every other day.      predniSONE 20 MG tablet   Commonly known as: DELTASONE   Take 1 tablet (20 mg total) by mouth daily with breakfast.      predniSONE 10 MG tablet   Commonly known as: DELTASONE   Take 1 tablet (10 mg total) by mouth daily with breakfast.      predniSONE 2.5 MG tablet   Commonly known as: DELTASONE   Take 3 tablets (7.5 mg total) by mouth daily with breakfast.      promethazine 25 MG tablet   Commonly known as: PHENERGAN   Take 25 mg by mouth every 6 (six) hours as needed. For nausea      pyridostigmine 60 MG tablet   Commonly known as: MESTINON   Take 60 mg by mouth 4 (four) times daily.      rosuvastatin 10 MG tablet   Commonly known as: CRESTOR   Take 10 mg by mouth daily after breakfast.            Diagnostic Studies: Dg Chest 2 View  03/21/2012  *RADIOLOGY REPORT*  Clinical Data: Right knee replacement  CHEST - 2 VIEW  Comparison: 09/24/2009  Findings: Upper normal heart size. Calcified tortuous aorta. Pulmonary vascularity normal. Minimal peribronchial thickening and bibasilar atelectasis. Lungs otherwise clear. No pleural effusion or pneumothorax. Multiple thoracic compression deformities with prior vertebroplasties at question  three levels.  IMPRESSION: Minimal bronchitic changes and bibasilar atelectasis.  Original Report  Authenticated By: Lollie Marrow, M.D.    Disposition: 01-Home or Self Care  Discharge Orders    Future Orders Please Complete By Expires   Diet general      Weight bearing as tolerated      CPM      Comments:   Continuous passive motion machine (CPM):      Use the CPM from 0 to 60 for 8 hours per day.      You may increase by 10 degrees per day.  You may break it up into 2 or 3 sessions per day.      Use CPM for 1-2 weeks or until you are told to stop.   Do not put a pillow under the knee. Place it under the heel.            SignedMatthew Folks 03/31/2012, 11:15 AM

## 2012-03-31 NOTE — Progress Notes (Signed)
PT Progress Note:     03/31/12 1300  PT Visit Information  Last PT Received On 03/31/12  Assistance Needed +1  PT Time Calculation  PT Start Time 0756  PT Stop Time 0819  PT Time Calculation (min) 23 min  Precautions  Precautions Knee  Required Braces or Orthoses Knee Immobilizer - Right  Knee Immobilizer - Right Discontinue once straight leg raise with < 10 degree lag  Restrictions  RLE Weight Bearing WBAT  Cognition  Overall Cognitive Status Impaired  Area of Impairment Safety/judgement  Arousal/Alertness Awake/alert  Orientation Level Oriented X4 / Intact  Behavior During Session [facility] for tasks performed  Safety/Judgement Impulsive;Decreased awareness of need for assistance  Safety/Judgement - Other Comments decreased impulsiveness this sesison but still present.  Pt stating he would be fine to get by himself to ambulate to bathroom.    Bed Mobility  Bed Mobility Supine to Sit;Sitting - Scoot to Edge of Bed  Supine to Sit 5: Supervision;HOB flat  Sitting - Scoot to Edge of Bed 5: Supervision  Details for Bed Mobility Assistance Cues for safety due to hips very close to EOB.  Transfers  Transfers Sit to Stand;Stand to Sit  Sit to Stand 4: Min guard;With upper extremity assist;From bed  Stand to Sit 4: Min guard;With upper extremity assist;With armrests;To chair/3-in-1  Details for Transfer Assistance Cues for safety, to ensure RW positioning, & body positioning before sitting.    Ambulation/Gait  Ambulation/Gait Assistance 4: Min guard  Ambulation Distance (Feet) 150 Feet  Assistive device Rolling walker  Ambulation/Gait Assistance Details Cues to stay inside RW, safety with turns, tall posture, increased floor clearance, & increased knee extension during stance phase  Gait Pattern Step-through pattern;Decreased stride length  Stairs No  Wheelchair Mobility  Wheelchair Mobility No  Balance  Balance Assessed No  Exercises  Exercises Total Joint  Total Joint Exercises    Ankle Circles/Pumps AROM;Both;15 reps  Quad Sets AROM;Both;15 reps  Heel Slides AROM;Right;15 reps  Hip ABduction/ADduction AROM;Right;15 reps  Straight Leg Raises AROM;Right;15 reps  PT - End of Session  Equipment Utilized During Treatment Gait belt  Activity Tolerance Patient tolerated treatment well  Patient left in chair;with call bell/phone within reach  Nurse Communication Mobility status  PT - Assessment/Plan  Comments on Treatment Session Although pt is improving with safety & decreased impulsiveness he cont'd to require cues throughotu for safety.    PT Frequency 7X/week  Recommendations for Other Services OT consult  Follow Up Recommendations Skilled nursing facility;Home health PT;Supervision/Assistance - 24 hour  Equipment Recommended Rolling walker with 5" wheels;Other (comment)  Acute Rehab PT Goals  Time For Goal Achievement 04/05/12  Potential to Achieve Goals Good  PT Goal: Supine/Side to Sit - Progress Met  PT Goal: Sit to Stand - Progress Met  PT Goal: Stand to Sit - Progress Met  PT Goal: Ambulate - Progress Met  PT Goal: Perform Home Exercise Program - Progress Progressing toward goal     Pain:  4/10 knee.  Premedicated.     [name], [place] [phone] 03/31/2012

## 2012-05-13 ENCOUNTER — Encounter (HOSPITAL_COMMUNITY): Payer: Self-pay | Admitting: *Deleted

## 2012-05-13 ENCOUNTER — Emergency Department (INDEPENDENT_AMBULATORY_CARE_PROVIDER_SITE_OTHER): Payer: Medicare Other

## 2012-05-13 ENCOUNTER — Emergency Department (INDEPENDENT_AMBULATORY_CARE_PROVIDER_SITE_OTHER)
Admission: EM | Admit: 2012-05-13 | Discharge: 2012-05-13 | Disposition: A | Discharge status: Home / Self Care | Payer: Medicare Other | Source: Home / Self Care | Attending: Family Medicine | Admitting: Family Medicine

## 2012-05-13 DIAGNOSIS — N390 Urinary tract infection, site not specified: Secondary | ICD-10-CM

## 2012-05-13 DIAGNOSIS — K5909 Other constipation: Secondary | ICD-10-CM

## 2012-05-13 DIAGNOSIS — K5904 Chronic idiopathic constipation: Secondary | ICD-10-CM

## 2012-05-13 LAB — POCT URINALYSIS DIP (DEVICE)
Ketones, ur: NEGATIVE mg/dL
Protein, ur: >=300 mg/dL — AB
Specific Gravity, Urine: 1.025 (ref 1.005–1.030)
Urobilinogen, UA: 0.2 mg/dL (ref 0.0–1.0)

## 2012-05-13 MED ORDER — CEPHALEXIN 500 MG PO CAPS: 500.0000 mg | ORAL_CAPSULE | Freq: Four times a day (QID) | ORAL | Status: AC

## 2012-05-13 MED ORDER — POLYETHYLENE GLYCOL 3350 17 GM/SCOOP PO POWD: 17.0000 g | Freq: Every day | ORAL | Status: AC

## 2012-05-13 NOTE — ED Provider Notes (Signed)
History     CSN: [id number]  Arrival date & time 05/13/12  1725   First MD Initiated Contact with Patient 05/13/12 1733      Chief Complaint  Patient presents with  . Diarrhea  . Urinary Frequency  . Constipation  . Nausea    (Consider location/radiation/quality/duration/timing/severity/associated sxs/prior treatment) Patient is a 78 y.o. male presenting with diarrhea, frequency, and constipation. The history is provided by the patient.  Diarrhea The primary symptoms include nausea, vomiting, diarrhea and dysuria. Primary symptoms do not include fever or abdominal pain. The illness began 6 to 7 days ago. The onset was sudden. The problem has been resolved (primary sx resolved with immodium after 3 days, now with constipation for 2 days., no blood seen, ).  The dysuria is associated with frequency.  The illness is also significant for constipation.  Urinary Frequency Pertinent negatives include no abdominal pain.  Constipation  Associated symptoms include diarrhea, nausea and vomiting. Pertinent negatives include no fever and no abdominal pain.    Past Medical History  Diagnosis Date  . Coronary artery disease   . Diverticulitis   . GERD (gastroesophageal reflux disease)   . Benign prostatic hypertrophy   . Partial small bowel obstruction 04/16/10    resolved with bowel rest  . Hypertension     Dr. Alanda Amass- within a month or 2 to clear for surgery   . Shortness of breath   . Pulmonary embolus     2000  . Arthritis     back, hips, knees   . Cancer     h/o renal carcinoma   . Chronic kidney disease     h/o UTI-2012  . Myasthenia gravis   . Myasthenia gravis     Dr. Anne Hahn    Past Surgical History  Procedure Date  . Total knee arthroplasty     both Sees Dr. Margreta Journey  . Nephrectomy     left  . Angioplasty     had 2 stent replacements  . Stented placed 1/08  . Colonoscopy 05/28/10    per Dr. Carman Ching, diverticulosis, no repeats planned   . Sp kyphoplasty      and vertebroplasty  . Cardiac catheterization     2008, stents   . Eye surgery     cataracts(bilateral)  removed, ?iol  . Tonsillectomy   . Joint replacement     2005-R, L knee replacement- 2000  . Total knee revision 03/28/2012    Procedure: TOTAL KNEE REVISION;  Surgeon: Velna Ochs, MD;  Location: [facility] OR;  Service: Orthopedics;  Laterality: Right;    History reviewed. No pertinent family history.  History  Substance Use Topics  . Smoking status: Former Smoker    Quit date: 03/22/1959  . Smokeless tobacco: Never Used  . Alcohol Use: No      Review of Systems  Constitutional: Negative.  Negative for fever.  Gastrointestinal: Positive for nausea, vomiting, diarrhea and constipation. Negative for abdominal pain.  Genitourinary: Positive for dysuria and frequency.    Allergies  Dicyclomine  Home Medications   Current Outpatient Rx  Name Route Sig Dispense Refill  . ASPIRIN 81 MG PO TABS Oral Take 81 mg by mouth daily.    Marland Kitchen CALCIUM CARBONATE-VITAMIN D 500-200 MG-UNIT PO TABS Oral Take 1 tablet by mouth 4 (four) times daily.     Marland Kitchen CLOPIDOGREL BISULFATE 75 MG PO TABS Oral Take 75 mg by mouth daily.    Marland Kitchen HYDROCODONE-ACETAMINOPHEN 5-500 MG PO TABS Oral Take  1 tablet by mouth every 6 (six) hours as needed for pain. 120 tablet 5  . METOPROLOL SUCCINATE ER 50 MG PO TB24 Oral Take 50 mg by mouth daily after breakfast. Take with or immediately following a meal.    . ADULT MULTIVITAMIN W/MINERALS CH Oral Take 1 tablet by mouth daily.    Marland Kitchen MYCOPHENOLATE MOFETIL 500 MG PO TABS Oral Take 500 mg by mouth daily.      . OMEGA-3-ACID ETHYL ESTERS 1 G PO CAPS Oral Take 2 g by mouth daily.    Marland Kitchen PANTOPRAZOLE SODIUM 40 MG PO TBEC Oral Take 40 mg by mouth 2 (two) times daily as needed. For upset stomach    . POLYETHYLENE GLYCOL 3350 PO PACK Oral Take 17 g by mouth daily as needed. For constipation    . PREDNISONE 10 MG PO TABS Oral Take 1 tablet (10 mg total) by mouth daily with  breakfast. 3 tablet 0  . PYRIDOSTIGMINE BROMIDE 60 MG PO TABS Oral Take 60 mg by mouth 4 (four) times daily.     Marland Kitchen ROSUVASTATIN CALCIUM 10 MG PO TABS Oral Take 10 mg by mouth daily after breakfast.     . TRIAMTERENE-HCTZ 37.5-25 MG PO CAPS Oral Take 1 capsule by mouth every morning.    Marland Kitchen TRIMETHOPRIM 100 MG PO TABS Oral Take 100 mg by mouth 1 day or 1 dose.    . CEPHALEXIN 500 MG PO CAPS Oral Take 1 capsule (500 mg total) by mouth 4 (four) times daily. Take all of medicine and drink lots of fluids 20 capsule 0  . POLYETHYLENE GLYCOL 3350 PO POWD Oral Take 17 g by mouth daily. As needed for constipation. 255 g 0  . PREDNISONE 2.5 MG PO TABS Oral Take 3 tablets (7.5 mg total) by mouth every other day. 30 tablet 0  . PROMETHAZINE HCL 25 MG PO TABS Oral Take 25 mg by mouth every 6 (six) hours as needed. For nausea      BP 121/77  Pulse 75  Temp 98.3 F (36.8 C) (Oral)  Resp 18  SpO2 95%  Physical Exam  Nursing note and vitals reviewed. Constitutional: He is oriented to person, place, and time. He appears well-developed and well-nourished.  Abdominal: Soft. Bowel sounds are normal. He exhibits no distension and no mass. There is no tenderness. There is no rebound and no guarding.  Neurological: He is alert and oriented to person, place, and time.  Skin: Skin is warm and dry.  Psychiatric: He has a normal mood and affect. His behavior is normal.    ED Course  Procedures (including critical care time)  Labs Reviewed  POCT URINALYSIS DIP (DEVICE) - Abnormal; Notable for the following:    Hgb urine dipstick MODERATE (*)     Protein, ur >=300 (*)     Nitrite POSITIVE (*)     Leukocytes, UA MODERATE (*)  Biochemical Testing Only. Please order routine urinalysis from main lab if confirmatory testing is needed.   All other components within normal limits  URINE CULTURE   Dg Abd 1 View  05/13/2012  *RADIOLOGY REPORT*  Clinical Data: Constipation  ABDOMEN - 1 VIEW  Comparison: None.   Findings: Nonobstructive bowel gas pattern.  Mild to moderate stool burden.  Multiple surgical clips in the left abdomen.  Degenerative changes of the visualized thoracolumbar spine.  Prior vertebral augmentation at L1.  Vascular calcifications.  IMPRESSION: No evidence of bowel obstruction.  Mild to moderate stool burden.  Original Report  Authenticated By: Charline Bills, M.D.     1. Constipation - functional   2. UTI (lower urinary tract infection)       MDM  X-rays reviewed and report per radiologist.         Linna Hoff, MD 05/13/12 [phone]

## 2012-05-13 NOTE — ED Notes (Signed)
Per pt after eating breakfast 6 days ago onset of diarrhea took immodium diarrhea resolved - now with constipation small hard stools x 2 days - nausea/urinary frequency/urgency x approx 2 days intermittent - rectum sore from constipation -

## 2012-05-16 LAB — URINE CULTURE: Colony Count: >=100000

## 2012-05-17 NOTE — ED Notes (Signed)
Urine culture:>100,000 colonies Klebsiella Pneumoniae.  Pt. adequately treated with Keflex. [name] 05/17/2012

## 2012-05-26 ENCOUNTER — Encounter: Payer: Self-pay | Admitting: Family Medicine

## 2012-05-26 ENCOUNTER — Ambulatory Visit (INDEPENDENT_AMBULATORY_CARE_PROVIDER_SITE_OTHER): Payer: Medicare Other | Admitting: Family Medicine

## 2012-05-26 VITALS — BP 123/70 | HR 88 | Temp 98.1°F | Wt 254.0 lb

## 2012-05-26 DIAGNOSIS — N390 Urinary tract infection, site not specified: Secondary | ICD-10-CM

## 2012-05-26 DIAGNOSIS — R040 Epistaxis: Secondary | ICD-10-CM

## 2012-05-26 NOTE — Progress Notes (Signed)
  Subjective:    Patient ID: [name], male    DOB: [birth year], 78 y.o.   MRN: [id number]  HPI Here with some issues. He has been seeing a urologist at [facility] for several years for BPH and recurrent UTIs but he is unhappy with how things are going. He had seen Dr. Darvin Neighbours in the past and would like to see him again. He is aware that Dr. Earlene Plater now works at [facility]. Also he has had some slight nosebleeds for several weeks. He was started on Nasonex sprays daily about 2 months ago by Dr. Alanda Amass for nasal congestion. Also he has been taking Keflex for a UTI and had a culture done recently on this. He has not heard the results yet.    Review of Systems  Constitutional: Negative.   HENT: Positive for nosebleeds.   Respiratory: Negative.   Cardiovascular: Negative.   Gastrointestinal: Negative.   Genitourinary: Positive for urgency and frequency.       Objective:   Physical Exam  Constitutional: He appears well-developed and well-nourished.  HENT:  Right Ear: External ear normal.  Left Ear: External ear normal.  Nose: Nose normal.  Mouth/Throat: Oropharynx is clear and moist.  Cardiovascular: Normal rate, regular rhythm, normal heart sounds and intact distal pulses.   Pulmonary/Chest: Effort normal and breath sounds normal.  Abdominal: Soft. Bowel sounds are normal. He exhibits no distension and no mass. There is no tenderness. There is no rebound and no guarding.          Assessment & Plan:  The nosebleeds are probably side effects of the Nasonex, so I suggested he cut the sprays back to every other day . His urine culture revealed that the Klebsiella is sensitive to cephalosporins so I advised him to finish out the Keflex. We will refer him to see Dr. Earlene Plater.

## 2012-06-15 ENCOUNTER — Encounter (HOSPITAL_COMMUNITY): Payer: Self-pay | Admitting: *Deleted

## 2012-06-15 ENCOUNTER — Emergency Department (INDEPENDENT_AMBULATORY_CARE_PROVIDER_SITE_OTHER)
Admission: EM | Admit: 2012-06-15 | Discharge: 2012-06-15 | Disposition: A | Discharge status: Home / Self Care | Payer: Medicare Other | Source: Home / Self Care | Attending: Emergency Medicine | Admitting: Emergency Medicine

## 2012-06-15 DIAGNOSIS — N390 Urinary tract infection, site not specified: Secondary | ICD-10-CM

## 2012-06-15 LAB — POCT URINALYSIS DIP (DEVICE)
Bilirubin Urine: NEGATIVE
Nitrite: NEGATIVE
Urobilinogen, UA: 0.2 mg/dL (ref 0.0–1.0)
pH: 5.5 (ref 5.0–8.0)

## 2012-06-15 MED ORDER — CIPROFLOXACIN HCL 500 MG PO TABS: 500.0000 mg | ORAL_TABLET | Freq: Two times a day (BID) | ORAL | Status: AC

## 2012-06-15 NOTE — ED Provider Notes (Signed)
Chief Complaint  Patient presents with  . Urinary Tract Infection    History of Present Illness:   [name] is an 78 year old male who for the past year has had recurring urinary tract infections. These occur about once a month. He has seen Dr. Earlene Plater for these and plans to see him again next week. He has taken multiple courses of antibiotics. He finds that Cipro works the best. He was doing well up until yesterday when he noted burning, frequency, and urgency. He denies any blood in urine. He's had no fever or chills. No lower back or lower abdominal pain. No nausea or vomiting.  Review of Systems:  Other than noted above, the patient denies any of the following symptoms: General:  No fevers, chills, sweats, aches, or fatigue. GI:  No abdominal pain, back pain, nausea, vomiting, diarrhea, or constipation. GU:  No dysuria, frequency, urgency, hematuria, urethral discharge, penile lesions, penile pain, testicular pain, swelling, or mass, inguinal lymphadenopathy or incontinence.  PMFSH:  Past medical history, family history, social history, meds, and allergies were reviewed.  Physical Exam:   Vital signs:  BP 143/76  Pulse 90  Temp 98.1 F (36.7 C) (Oral)  Resp 20  SpO2 94% Gen:  Alert, oriented, in no distress. Lungs:  Clear to auscultation, no wheezes, rales or rhonchi. Heart:  Regular rhythm, no gallop or murmer. Abdomen:  Flat and soft.  No tenderness to palpation, guarding, or rebound.  No hepato-splenomegaly or mass.  Bowel sounds were normally active.  No hernia. Back:  No CVA tenderness.  Skin:  Clear, warm and dry.  Labs:   Results for orders placed during the hospital encounter of 06/15/12  POCT URINALYSIS DIP (DEVICE)      Component Value Range   Glucose, UA NEGATIVE  NEGATIVE mg/dL   Bilirubin Urine NEGATIVE  NEGATIVE   Ketones, ur NEGATIVE  NEGATIVE mg/dL   Specific Gravity, Urine 1.025  1.005 - 1.030   Hgb urine dipstick LARGE (*) NEGATIVE   pH 5.5  5.0 - 8.0   Protein, ur >=300 (*) NEGATIVE mg/dL   Urobilinogen, UA 0.2  0.0 - 1.0 mg/dL   Nitrite NEGATIVE  NEGATIVE   Leukocytes, UA LARGE (*) NEGATIVE    Other Labs Obtained at Urgent Care Center:  A urine culture was obtained.  Results are pending at this time and we will call about any positive results.  Assessment: The encounter diagnosis was UTI (lower urinary tract infection).   Plan:   1.  The following meds were prescribed:   New Prescriptions   CIPROFLOXACIN (CIPRO) 500 MG TABLET    Take 1 tablet (500 mg total) by mouth every 12 (twelve) hours.   2.  The patient was instructed in symptomatic care and handouts were given. 3.  The patient was told to return if becoming worse in any way, if no better in 3 or 4 days, and given some red flag symptoms that would indicate earlier return.  Follow up:  The patient was told to follow up with Dr. Darvin Neighbours next week.      Reuben Likes, MD 06/15/12 [phone]

## 2012-06-15 NOTE — ED Notes (Signed)
Pt is here with complaints of recurrent UTI with urinary frequency, urgency and burning.  Pt has been seen in the past by a Urologist and has an appointment to see a Dr. Earlene Plater in [facility] soon (unsure of exact date).  Pt states he has been treated at [facility] for UTI in the past as well.

## 2012-06-17 LAB — URINE CULTURE

## 2012-06-19 ENCOUNTER — Other Ambulatory Visit: Payer: Self-pay | Admitting: Urology

## 2012-06-19 DIAGNOSIS — Z87440 Personal history of urinary (tract) infections: Secondary | ICD-10-CM

## 2012-06-19 DIAGNOSIS — R319 Hematuria, unspecified: Secondary | ICD-10-CM

## 2012-06-19 NOTE — ED Notes (Signed)
Urine culture: >100,000 colonies Proteus Mirabilis.  Pt. adequately treated with Cipro. [name] 06/19/2012

## 2012-06-21 ENCOUNTER — Ambulatory Visit
Admission: RE | Admit: 2012-06-21 | Discharge: 2012-06-21 | Disposition: A | Discharge status: Home / Self Care | Payer: Medicare Other | Source: Ambulatory Visit | Attending: Urology | Admitting: Urology

## 2012-06-21 DIAGNOSIS — R319 Hematuria, unspecified: Secondary | ICD-10-CM

## 2012-06-21 DIAGNOSIS — Z87440 Personal history of urinary (tract) infections: Secondary | ICD-10-CM

## 2012-09-26 ENCOUNTER — Other Ambulatory Visit: Payer: Self-pay

## 2012-09-26 NOTE — Telephone Encounter (Signed)
Rx refill for hydcodone-actaminophen 5-500.  Rx last filled 03/15/2012 #120 with 5 rf.  Pt last seen 05/26/12. Pls advise.

## 2012-09-28 MED ORDER — HYDROCODONE-ACETAMINOPHEN 5-500 MG PO TABS: 1.0000 | ORAL_TABLET | Freq: Four times a day (QID) | ORAL | Status: DC | PRN

## 2012-09-28 NOTE — Telephone Encounter (Signed)
Pt notified Rx refill called into pharmacy.

## 2012-09-28 NOTE — Telephone Encounter (Signed)
Call in #[phone] with 5 rf 

## 2013-01-11 ENCOUNTER — Other Ambulatory Visit: Payer: Self-pay | Admitting: Neurology

## 2013-01-19 ENCOUNTER — Other Ambulatory Visit: Payer: Self-pay | Admitting: Neurology

## 2013-02-13 ENCOUNTER — Telehealth: Payer: Self-pay | Admitting: Family Medicine

## 2013-02-13 NOTE — Telephone Encounter (Signed)
Refill request for Vicodin and change dosage.

## 2013-02-13 NOTE — Telephone Encounter (Signed)
Change Vicodin to 5/325 to take q 6 hours prn pain, call in #120 with 5 rf

## 2013-02-14 MED ORDER — HYDROCODONE-ACETAMINOPHEN 5-325 MG PO TABS: 1.0000 | ORAL_TABLET | Freq: Four times a day (QID) | ORAL | Status: DC | PRN

## 2013-02-14 NOTE — Telephone Encounter (Signed)
I called in script 

## 2013-04-09 ENCOUNTER — Other Ambulatory Visit: Payer: Self-pay | Admitting: Urology

## 2013-04-09 DIAGNOSIS — N281 Cyst of kidney, acquired: Secondary | ICD-10-CM

## 2013-04-10 ENCOUNTER — Ambulatory Visit
Admission: RE | Admit: 2013-04-10 | Discharge: 2013-04-10 | Disposition: A | Discharge status: Home / Self Care | Payer: Medicare Other | Source: Ambulatory Visit | Attending: Urology | Admitting: Urology

## 2013-04-10 DIAGNOSIS — N281 Cyst of kidney, acquired: Secondary | ICD-10-CM

## 2013-06-11 ENCOUNTER — Encounter: Payer: Self-pay | Admitting: Neurology

## 2013-06-14 ENCOUNTER — Encounter: Payer: Self-pay | Admitting: Neurology

## 2013-06-14 ENCOUNTER — Ambulatory Visit (INDEPENDENT_AMBULATORY_CARE_PROVIDER_SITE_OTHER): Payer: Medicare Other | Admitting: Neurology

## 2013-06-14 VITALS — BP 143/61 | HR 77 | Wt 261.0 lb

## 2013-06-14 DIAGNOSIS — G7000 Myasthenia gravis without (acute) exacerbation: Secondary | ICD-10-CM

## 2013-06-14 DIAGNOSIS — Z5181 Encounter for therapeutic drug level monitoring: Secondary | ICD-10-CM

## 2013-06-14 LAB — CBC WITH DIFFERENTIAL
Basos: 0 % (ref 0–3)
Eos: 5 % (ref 0–5)
Eosinophils Absolute: 0.2 10*3/uL (ref 0.0–0.4)
HCT: 41.4 % (ref 37.5–51.0)
Hemoglobin: 14.3 g/dL (ref 12.6–17.7)
MCH: 31.3 pg (ref 26.6–33.0)
MCHC: 34.5 g/dL (ref 31.5–35.7)
MCV: 91 fL (ref 79–97)
Monocytes Absolute: 0.6 10*3/uL (ref 0.1–0.9)
Neutrophils Absolute: 1.6 10*3/uL (ref 1.4–7.0)
Neutrophils Relative %: 48 % (ref 40–74)
RBC: 4.57 x10E6/uL (ref 4.14–5.80)

## 2013-06-14 LAB — COMPREHENSIVE METABOLIC PANEL
AST: 23 IU/L (ref 0–40)
Albumin/Globulin Ratio: 1.5 (ref 1.1–2.5)
Albumin: 3.8 g/dL (ref 3.5–4.7)
BUN: 17 mg/dL (ref 8–27)
CO2: 24 mmol/L (ref 18–29)
Calcium: 9.2 mg/dL (ref 8.6–10.2)
Creatinine, Ser: 0.97 mg/dL (ref 0.76–1.27)
GFR calc non Af Amer: 71 mL/min/{1.73_m2} (ref >59)
Globulin, Total: 2.6 g/dL (ref 1.5–4.5)
Glucose: 95 mg/dL (ref 65–99)
Sodium: 138 mmol/L (ref 134–144)
Total Protein: 6.4 g/dL (ref 6.0–8.5)

## 2013-06-14 NOTE — Progress Notes (Signed)
Reason for visit: Myasthenia gravis  [name] is an 79 y.o. male  History of present illness:  [name] is a 79 year old right-handed white male with a history of myasthenia gravis. that has primarily ocular features. The patient has done well since last seen, without any new issues. The patient will occasionally have very transient double vision for a moment or 2, and when he blinks, the double vision goes away. The patient operates a motor vehicle without difficulty. The patient reports no weakness with chewing or swallowing, or any weakness of the extremities. No other new medical issues have come up since last seen.  Past Medical History  Diagnosis Date  . Coronary artery disease   . Diverticulitis   . GERD (gastroesophageal reflux disease)   . Benign prostatic hypertrophy   . Partial small bowel obstruction 04/16/10    resolved with bowel rest  . Hypertension     Dr. Alanda Amass- within a month or 2 to clear for surgery   . Shortness of breath   . Pulmonary embolus     2000  . Arthritis     back, hips, knees   . Cancer     h/o renal carcinoma   . Chronic kidney disease     h/o UTI-2012  . Obesity   . Myasthenia gravis     Dr. Anne Hahn  . Osteoporosis     Past Surgical History  Procedure Laterality Date  . Total knee arthroplasty      both Sees Dr. Margreta Journey  . Nephrectomy      left  . Angioplasty      had 2 stent replacements  . Stented placed  1/08  . Colonoscopy  05/28/10    per Dr. Carman Ching, diverticulosis, no repeats planned   . Sp kyphoplasty      and vertebroplasty  . Cardiac catheterization      2008, stents   . Eye surgery      cataracts(bilateral)  removed, ?iol  . Tonsillectomy    . Joint replacement      2005-R, L knee replacement- 2000  . Total knee revision  03/28/2012    Procedure: TOTAL KNEE REVISION;  Surgeon: Velna Ochs, MD;  Location: [facility] OR;  Service: Orthopedics;  Laterality: Right;    Family History  Problem Relation Age  of Onset  . Colon cancer Mother     Social history:  reports that he quit smoking about 54 years ago. His smoking use included Cigarettes. He smoked 0.00 packs per day. He has never used smokeless tobacco. He reports that he does not drink alcohol or use illicit drugs.    Allergies  Allergen Reactions  . Dicyclomine Other (See Comments)    REACTION: can worsen myasthenia gravis    Medications:  Current Outpatient Prescriptions on File Prior to Visit  Medication Sig Dispense Refill  . aspirin 81 MG tablet Take 81 mg by mouth daily.      . calcium-vitamin D (OSCAL WITH D) 500-200 MG-UNIT per tablet Take 1 tablet by mouth 4 (four) times daily.       . clopidogrel (PLAVIX) 75 MG tablet Take 75 mg by mouth daily.      Marland Kitchen HYDROcodone-acetaminophen (NORCO/VICODIN) 5-325 MG per tablet Take 1 tablet by mouth every 6 (six) hours as needed for pain.  120 tablet  5  . metoprolol succinate (TOPROL-XL) 50 MG 24 hr tablet Take 50 mg by mouth daily after breakfast. Take with or immediately following a  meal.      . Multiple Vitamin (MULTIVITAMIN WITH MINERALS) TABS Take 1 tablet by mouth daily.      . mycophenolate (CELLCEPT) 500 MG tablet TAKE 1 TABLET EVERY MORNING AND 2 TABLETS AT BEDTIME.  90 tablet  11  . omega-3 acid ethyl esters (LOVAZA) 1 G capsule Take 2 g by mouth daily.      . pantoprazole (PROTONIX) 40 MG tablet Take 40 mg by mouth 2 (two) times daily as needed. For upset stomach      . polyethylene glycol (MIRALAX / GLYCOLAX) packet Take 17 g by mouth daily as needed. For constipation      . promethazine (PHENERGAN) 25 MG tablet Take 25 mg by mouth every 6 (six) hours as needed. For nausea      . pyridostigmine (MESTINON) 60 MG tablet TAKE  (1)  TABLET  FOUR TIMES DAILY.  120 tablet  11  . rosuvastatin (CRESTOR) 10 MG tablet Take 10 mg by mouth daily after breakfast.       . triamterene-hydrochlorothiazide (DYAZIDE) 37.5-25 MG per capsule Take 1 capsule by mouth every morning.      .  trimethoprim (TRIMPEX) 100 MG tablet Take 100 mg by mouth 1 day or 1 dose.       No current facility-administered medications on file prior to visit.    ROS:  Out of a complete 14 system review of symptoms, the patient complains only of the following symptoms, and all other reviewed systems are negative.  Urination problems Double vision Muscle cramps Runny nose Insomnia  Blood pressure 143/61, pulse 77, weight 261 lb (118.389 kg).  Physical Exam  General: The patient is alert and cooperative at the time of the examination.The patient is markedly obese.  Skin: 1+ edema at the ankles is noted bilaterally.   Neurologic Exam  Cranial nerves: Facial symmetry is present. Speech is normal, no aphasia or dysarthria is noted. Extraocular movements are full. Visual fields are full.  Motor: The patient has good strength in all 4 extremities.  Coordination: The patient has good finger-nose-finger and heel-to-shin bilaterally.  Gait and station: The patient has a normal gait. Tandem gait is unsteady. Romberg is negative. No drift is seen.  Reflexes: Deep tendon reflexes are symmetric, but are depressed.   Assessment/Plan:  1. Myasthenia gravis, ocular features  The patient seems to be doing relatively well at this point. The patient is on prednisone taking 7.5 mg every other day, and he is on Mestinon. The patient is also on CellCept taking 500 mg in the morning, 1000 mg in the evening. We will check blood work today. On the next visit, we will try to initiate a taper of the prednisone. The patient followup in 6 months.  Marlan Palau MD 06/14/2013 2:01 PM  [facility] [address] [place], [place] [address]  Phone [phone] Fax [phone]

## 2013-06-15 ENCOUNTER — Emergency Department (INDEPENDENT_AMBULATORY_CARE_PROVIDER_SITE_OTHER)
Admission: EM | Admit: 2013-06-15 | Discharge: 2013-06-15 | Disposition: A | Discharge status: Home / Self Care | Payer: Medicare Other | Source: Home / Self Care | Attending: Family Medicine | Admitting: Family Medicine

## 2013-06-15 ENCOUNTER — Encounter (HOSPITAL_COMMUNITY): Payer: Self-pay | Admitting: Emergency Medicine

## 2013-06-15 DIAGNOSIS — R040 Epistaxis: Secondary | ICD-10-CM

## 2013-06-15 DIAGNOSIS — J329 Chronic sinusitis, unspecified: Secondary | ICD-10-CM

## 2013-06-15 MED ORDER — AMOXICILLIN 875 MG PO TABS: 875.0000 mg | ORAL_TABLET | Freq: Two times a day (BID) | ORAL | Status: DC

## 2013-06-15 NOTE — ED Provider Notes (Signed)
Medical screening examination/treatment/procedure(s) were performed by resident physician or non-physician practitioner and as supervising physician I was immediately available for consultation/collaboration.   KINDL,JAMES DOUGLAS MD.   James D Kindl, MD 06/15/13 1432 

## 2013-06-15 NOTE — ED Notes (Signed)
C/o sinus pressure and pain, nasal stuffiness with nose bleeds off/on.  X 1 wk. Denies fever and any other symptoms. Pt has tried otc meds with no relief.

## 2013-06-15 NOTE — ED Provider Notes (Signed)
CSN: [id number]     Arrival date & time 06/15/13  1020 History   First MD Initiated Contact with Patient 06/15/13 1035     Chief Complaint  Patient presents with  . Facial Pain    sinus pressure and pain with nose bleeds off/on   (Consider location/radiation/quality/duration/timing/severity/associated sxs/prior Treatment) HPI Comments: 79 year old male presents complaining of epistaxis or on off for one week, stuffy nose, and sinus pressure on the left side. This started one week ago with a significant nosebleed that was heavy for 1 hour, and lasted about 3-4 hours before complete resolution. Since then, he has had intermittent bloody mucus discharge from the bilateral nares. He has had some nasal congestion and some sinus pressure as well. Now, he is no symptoms. He does not feel sick, fever, chills, NVD, sore throat. No dark stools.   Past Medical History  Diagnosis Date  . Coronary artery disease   . Diverticulitis   . GERD (gastroesophageal reflux disease)   . Benign prostatic hypertrophy   . Partial small bowel obstruction 04/16/10    resolved with bowel rest  . Hypertension     Dr. Alanda Amass- within a month or 2 to clear for surgery   . Shortness of breath   . Pulmonary embolus     2000  . Arthritis     back, hips, knees   . Cancer     h/o renal carcinoma   . Chronic kidney disease     h/o UTI-2012  . Obesity   . Myasthenia gravis     Dr. Anne Hahn  . Osteoporosis    Past Surgical History  Procedure Laterality Date  . Total knee arthroplasty      both Sees Dr. Margreta Journey  . Nephrectomy      left  . Angioplasty      had 2 stent replacements  . Stented placed  1/08  . Colonoscopy  05/28/10    per Dr. Carman Ching, diverticulosis, no repeats planned   . Sp kyphoplasty      and vertebroplasty  . Cardiac catheterization      2008, stents   . Eye surgery      cataracts(bilateral)  removed, ?iol  . Tonsillectomy    . Joint replacement      2005-R, L knee replacement-  2000  . Total knee revision  03/28/2012    Procedure: TOTAL KNEE REVISION;  Surgeon: Velna Ochs, MD;  Location: [facility] OR;  Service: Orthopedics;  Laterality: Right;   Family History  Problem Relation Age of Onset  . Colon cancer Mother    History  Substance Use Topics  . Smoking status: Former Smoker    Types: Cigarettes    Quit date: 03/22/1959  . Smokeless tobacco: Never Used     Comment: quit 1970s  . Alcohol Use: No    Review of Systems  Constitutional: Negative for fever, chills and fatigue.  HENT: Positive for nosebleeds, congestion, rhinorrhea and sinus pressure. Negative for ear pain, sore throat, trouble swallowing, neck pain, neck stiffness, dental problem and postnasal drip.   Eyes: Negative for visual disturbance.  Respiratory: Negative for cough and shortness of breath.   Cardiovascular: Negative for chest pain, palpitations and leg swelling.  Gastrointestinal: Negative for nausea, vomiting, abdominal pain, diarrhea and constipation.  Genitourinary: Negative for dysuria, urgency, frequency and hematuria.  Musculoskeletal: Negative for myalgias and arthralgias.  Skin: Negative for rash.  Neurological: Negative for dizziness, weakness and light-headedness.    Allergies  Dicyclomine  Home Medications   Current Outpatient Rx  Name  Route  Sig  Dispense  Refill  . aspirin 81 MG tablet   Oral   Take 81 mg by mouth daily.         . clopidogrel (PLAVIX) 75 MG tablet   Oral   Take 75 mg by mouth daily.         . metoprolol succinate (TOPROL-XL) 50 MG 24 hr tablet   Oral   Take 50 mg by mouth daily after breakfast. Take with or immediately following a meal.         . mycophenolate (CELLCEPT) 500 MG tablet      TAKE 1 TABLET EVERY MORNING AND 2 TABLETS AT BEDTIME.   90 tablet   11   . omega-3 acid ethyl esters (LOVAZA) 1 G capsule   Oral   Take 2 g by mouth daily.         . pantoprazole (PROTONIX) 40 MG tablet   Oral   Take 40 mg by mouth 2  (two) times daily as needed. For upset stomach         . pyridostigmine (MESTINON) 60 MG tablet      TAKE  (1)  TABLET  FOUR TIMES DAILY.   120 tablet   11   . rosuvastatin (CRESTOR) 10 MG tablet   Oral   Take 10 mg by mouth daily after breakfast.          . triamterene-hydrochlorothiazide (DYAZIDE) 37.5-25 MG per capsule   Oral   Take 1 capsule by mouth every morning.         . trimethoprim (TRIMPEX) 100 MG tablet   Oral   Take 100 mg by mouth 1 day or 1 dose.         Marland Kitchen amoxicillin (AMOXIL) 875 MG tablet   Oral   Take 1 tablet (875 mg total) by mouth 2 (two) times daily.   20 tablet   0   . calcium-vitamin D (OSCAL WITH D) 500-200 MG-UNIT per tablet   Oral   Take 1 tablet by mouth 4 (four) times daily.          Marland Kitchen HYDROcodone-acetaminophen (NORCO/VICODIN) 5-325 MG per tablet   Oral   Take 1 tablet by mouth every 6 (six) hours as needed for pain.   120 tablet   5   . Multiple Vitamin (MULTIVITAMIN WITH MINERALS) TABS   Oral   Take 1 tablet by mouth daily.         . polyethylene glycol (MIRALAX / GLYCOLAX) packet   Oral   Take 17 g by mouth daily as needed. For constipation         . predniSONE (DELTASONE) 2.5 MG tablet   Oral   Take 7.5 mg by mouth every other day.         . promethazine (PHENERGAN) 25 MG tablet   Oral   Take 25 mg by mouth every 6 (six) hours as needed. For nausea          BP 143/74  Pulse 78  Temp(Src) 98.2 F (36.8 C) (Oral)  Resp 18  SpO2 95% Physical Exam  Nursing note and vitals reviewed. Constitutional: He is oriented to person, place, and time. He appears well-developed and well-nourished. No distress.  HENT:  Head: Normocephalic and atraumatic.  Right Ear: External ear normal.  Left Ear: External ear normal.  Nose: Epistaxis (there is clotted blood in both nares, no active bleeding) is observed.  Mouth/Throat: Oropharynx is clear and moist. No oropharyngeal exudate or posterior oropharyngeal erythema.  No  bleeding in the posterior oropharynx to suggest a posterior bleed  Eyes: Conjunctivae are normal. Pupils are equal, round, and reactive to light.  Neck: Normal range of motion. Neck supple.  Lymphadenopathy:    He has no cervical adenopathy.  Neurological: He is alert and oriented to person, place, and time.  Skin: Skin is warm and dry. No rash noted. He is not diaphoretic.  Psychiatric: He has a normal mood and affect. Judgment normal.    ED Course  Procedures (including critical care time) Labs Review Labs Reviewed - No data to display Imaging Review No results found.  MDM   1. Sinusitis   2. Epistaxis    Patient has been having episodes of epistaxis and sinus pressure. He does take Plavix which is likely contributing to this. Given the sinus pressure, this may also be a sinus infection. Treated for sinus infection and use Nasal gel to treat any dryness which may cause bleeds. I have advised him to return here or to the emergency department if the active bleeding should return given he is on blood thinners   Meds ordered this encounter  Medications  . amoxicillin (AMOXIL) 875 MG tablet    Sig: Take 1 tablet (875 mg total) by mouth 2 (two) times daily.    Dispense:  20 tablet    Refill:  0       Graylon Good, PA-C 06/15/13 1136

## 2013-06-18 NOTE — Progress Notes (Signed)
Quick Note:  I called pt and relayed the message that labwork unremarkable. He relayed understanding. ______

## 2013-07-19 ENCOUNTER — Telehealth: Payer: Self-pay | Admitting: Cardiovascular Disease

## 2013-07-19 NOTE — Telephone Encounter (Signed)
No message

## 2013-07-23 ENCOUNTER — Ambulatory Visit (INDEPENDENT_AMBULATORY_CARE_PROVIDER_SITE_OTHER): Payer: Medicare Other | Admitting: Cardiovascular Disease

## 2013-07-23 ENCOUNTER — Encounter: Payer: Self-pay | Admitting: Cardiovascular Disease

## 2013-07-23 VITALS — BP 120/80 | HR 76 | Ht 70.0 in | Wt 261.9 lb

## 2013-07-23 DIAGNOSIS — E785 Hyperlipidemia, unspecified: Secondary | ICD-10-CM

## 2013-07-23 DIAGNOSIS — Z905 Acquired absence of kidney: Secondary | ICD-10-CM

## 2013-07-23 DIAGNOSIS — I10 Essential (primary) hypertension: Secondary | ICD-10-CM

## 2013-07-23 DIAGNOSIS — I2510 Atherosclerotic heart disease of native coronary artery without angina pectoris: Secondary | ICD-10-CM

## 2013-07-23 NOTE — Patient Instructions (Signed)
Your physician recommends that you schedule a follow-up appointment and blood work in 6 MONTHS.

## 2013-07-25 ENCOUNTER — Encounter: Payer: Self-pay | Admitting: Cardiovascular Disease

## 2013-07-25 DIAGNOSIS — E785 Hyperlipidemia, unspecified: Secondary | ICD-10-CM | POA: Insufficient documentation

## 2013-07-25 DIAGNOSIS — Z905 Acquired absence of kidney: Secondary | ICD-10-CM | POA: Insufficient documentation

## 2013-07-25 NOTE — Progress Notes (Signed)
Patient ID: [name], male   DOB: 1925/01/08, 79 y.o.   MRN: [id number]     PATIENT PROFILE:  [name] is an 79 year old gentleman who presents to the office today to establish neurologic care with me. He is a former patient of Dr. Alanda Amass.    HPI: [name] has a history of established coronary artery disease in April 2002 underwent stenting of his mid LAD with bare-metal stent. At that time he was found to have an occluded mid circumflex with right-to-left collaterals and no significant right coronary artery disease. He underwent a subsequent catheterization in 2000 and 8000 have high-grade distal RCA stenosis for which he was underwent insertion of a 3.0x13 mm drug-eluting Cypher stent. His LAD stent was patent but he did have 70% stenosis in the diagonal vessel arising from the proximal portion of the stent. His distal circumflex again was subtotally occluded. Ejection fraction was 55%. His last Myoview study was in May 2012 which showed moderate inferior scar without significant ischemia. He has been on medical therapy for his coronary artery disease.  Additional problems include undergoing a left nephrectomy in 1992 for nonmalignant disease done by Dr. Earlene Nolan. He also has a history of myasthenia gravis for which she's been on chronic Mestinon therapy in the direction of Dr. Anne Nolan. Patient also has undergone knee replacement surgery bi laterally.   He has remained fairly active. He denies any exertionally precipitated chest pain. He denies any awareness of palpitations. He denies presyncope or syncope. He does have a history of hypertension as well as hyperlipidemia. He last saw Dr. Alanda Amass in his solo practice 6 months ago.  Past Medical History  Diagnosis Date  . Coronary artery disease   . Diverticulitis   . GERD (gastroesophageal reflux disease)   . Benign prostatic hypertrophy   . Partial small bowel obstruction 04/16/10    resolved with bowel rest  . Hypertension    Dr. Alanda Amass- within a month or 2 to clear for surgery   . Shortness of breath   . Pulmonary embolus     2000  . Arthritis     back, hips, knees   . Cancer     h/o renal carcinoma   . Chronic kidney disease     h/o UTI-2012  . Obesity   . Myasthenia gravis     Dr. Anne Nolan  . Osteoporosis     Past Surgical History  Procedure Laterality Date  . Total knee arthroplasty      both Sees Dr. Margreta Journey  . Nephrectomy      left  . Angioplasty      had 2 stent replacements  . Stented placed  1/08  . Colonoscopy  05/28/10    per Dr. Carman Ching, diverticulosis, no repeats planned   . Sp kyphoplasty      and vertebroplasty  . Cardiac catheterization      2008, stents   . Eye surgery      cataracts(bilateral)  removed, ?iol  . Tonsillectomy    . Joint replacement      2005-R, L knee replacement- 2000  . Total knee revision  03/28/2012    Procedure: TOTAL KNEE REVISION;  Surgeon: Gerald Ochs, MD;  Location: [facility];  Service: Orthopedics;  Laterality: Right;    Allergies  Allergen Reactions  . Dicyclomine Other (See Comments)    REACTION: can worsen myasthenia gravis    Current Outpatient Prescriptions  Medication Sig Dispense Refill  . aspirin 81 MG  tablet Take 81 mg by mouth daily.      . clopidogrel (PLAVIX) 75 MG tablet Take 75 mg by mouth daily.      Marland Kitchen HYDROcodone-acetaminophen (NORCO/VICODIN) 5-325 MG per tablet Take 1 tablet by mouth every 6 (six) hours as needed for pain.  120 tablet  5  . metoprolol succinate (TOPROL-XL) 50 MG 24 hr tablet Take 50 mg by mouth daily after breakfast. Take with or immediately following a meal.      . Multiple Vitamin (MULTIVITAMIN WITH MINERALS) TABS Take 1 tablet by mouth daily.      . mycophenolate (CELLCEPT) 500 MG tablet TAKE 1 TABLET EVERY MORNING AND 2 TABLETS AT BEDTIME.  90 tablet  11  . omega-3 acid ethyl esters (LOVAZA) 1 G capsule Take 2 g by mouth daily.      . pantoprazole (PROTONIX) 40 MG tablet Take 40 mg by mouth 2  (two) times daily as needed. For upset stomach      . polyethylene glycol (MIRALAX / GLYCOLAX) packet Take 17 g by mouth daily as needed. For constipation      . predniSONE (DELTASONE) 2.5 MG tablet Take 7.5 mg by mouth every other day.      . promethazine (PHENERGAN) 25 MG tablet Take 25 mg by mouth every 6 (six) hours as needed. For nausea      . pyridostigmine (MESTINON) 60 MG tablet TAKE  (1)  TABLET  FOUR TIMES DAILY.  120 tablet  11  . rosuvastatin (CRESTOR) 10 MG tablet Take 10 mg by mouth daily after breakfast.       . triamterene-hydrochlorothiazide (DYAZIDE) 37.5-25 MG per capsule Take 1 capsule by mouth every morning.      . calcium-vitamin D (OSCAL WITH D) 500-200 MG-UNIT per tablet Take 1 tablet by mouth 4 (four) times daily.        No current facility-administered medications for this visit.    Social history is notable in that he was in the [employer] in World War [employer] in the [employer]. He worked in [employer]. He is married for 63 years. He has 3 children. There is no recent tobacco use having quit greater than 35 years ago. No alcohol use. He previously had been active in [employer] and retired in 2007.  Family History  Problem Relation Age of Onset  . Colon cancer Mother     ROS is negative for fever chills or night sweats. He denies skin lesions. He denies visual changes. He does have myasthenia gravis which has been controlled on Mestinon and low-dose prednisone followed by Dr. Anne Nolan. He denies cough or sputum production. There is no presyncope or syncope. He is unaware of tachycardia palpitations. He denies any anginal symptoms recently. There is no significant shortness of breath. He denies change in bowel or bladder habits. Denies nausea vomiting diarrhea. He denies hematuria. He denies blood in his stool. He is unaware of any anemia. There is no history of diabetes. He does have hyperlipidemia for which he is on Crestor. He denies myalgias. He denies  significant leg swelling. He denies difficulty with sleep. Other comprehensive 12 point system review is negative.  PE BP 120/80  Pulse 76  Ht 5\' 10"  (1.778 m)  Wt 261 lb 14.4 oz (118.797 kg)  BMI 37.58 kg/m2 Repeat blood pressure by me was 138/82. Pulse 76 General: Alert, oriented, no distress.  Skin: normal turgor, no rashes HEENT: Normocephalic, atraumatic. Pupils round and reactive; sclera anicteric; Fundi no  hemorrhages or exudates the Nose without nasal septal hypertrophy Mouth/Parynx benign; Mallinpatti scale 3 Neck: No JVD, no carotid briuts Lungs: clear to ausculatation and percussion; no wheezing or rales Heart: RRR, s1 s2 normal 1/6 systolic murmur. Abdomen: soft, nontender; no hepatosplenomehaly, BS+; abdominal aorta nontender and not dilated by palpation. Pulses 2+ Extremities: no clubbinbg cyanosis or edema, Homan's sign negative; no muscle tenderness to palpation Neurologic: grossly nonfocal Psychological: Normal affect and mood.    ECG: Normal sinus rhythm with first-degree AV block with PR interval 224 ms. Early transition. Voltage criteria for left ventricular hypertrophy.  LABS:  BMET    Component Value Date/Time   NA 138 06/14/2013 1125   NA 139 03/29/2012 0540   K 4.3 06/14/2013 1125   CL 106 06/14/2013 1125   CO2 24 06/14/2013 1125   GLUCOSE 95 06/14/2013 1125   GLUCOSE 124* 03/29/2012 0540   BUN 17 06/14/2013 1125   BUN 19 03/29/2012 0540   CREATININE 0.97 06/14/2013 1125   CALCIUM 9.2 06/14/2013 1125   GFRNONAA 71 06/14/2013 1125   GFRAA 82 06/14/2013 1125     Hepatic Function Panel     Component Value Date/Time   PROT 6.4 06/14/2013 1125   PROT 6.9 10/28/2011 2322   ALBUMIN 3.4* 10/28/2011 2322   AST 23 06/14/2013 1125   ALT 22 06/14/2013 1125   ALKPHOS 58 06/14/2013 1125   BILITOT 0.6 06/14/2013 1125   BILIDIR 0.2 09/26/2009 0448   IBILI 0.6 09/26/2009 0448     CBC    Component Value Date/Time   WBC 3.4 06/14/2013 1125   WBC 9.9 03/31/2012 0635     RBC 4.57 06/14/2013 1125   RBC 3.61* 03/31/2012 0635   HGB 14.3 06/14/2013 1125   HCT 41.4 06/14/2013 1125   PLT 179 06/14/2013 1125   MCV 91 06/14/2013 1125   MCH 31.3 06/14/2013 1125   MCH 31.0 03/31/2012 0635   MCHC 34.5 06/14/2013 1125   MCHC 33.8 03/31/2012 0635   RDW 13.7 06/14/2013 1125   RDW 14.0 03/31/2012 0635   LYMPHSABS 1.0 06/14/2013 1125   LYMPHSABS 1.2 03/21/2012 1113   MONOABS 0.5 03/21/2012 1113   EOSABS 0.2 06/14/2013 1125   EOSABS 0.3 03/21/2012 1113   BASOSABS 0.0 06/14/2013 1125   BASOSABS 0.0 03/21/2012 1113     BNP No results found for this basename: probnp    Lipid Panel  No results found for this basename: chol, trig, hdl, cholhdl, vldl, ldlcalc     RADIOLOGY: No results found.   ASSESSMENT AND PLAN: My impression is that Mr. Toscano is an active 79 year old gentleman who has established coronary artery disease dating back to 2002 when he underwent initial stenting of his left anterior descending artery. His last cardiac catheterization was in 2008 at which time he underwent stenting of his distal right coronary artery. He has documented subtotal occlusion of his distal circumflex vessel. His last nuclear perfusion study in May 2012 show a small region of inferior scar. Clinically, he is doing well on his current therapy. Did review laboratory which was done by Dr. Alanda Amass in April 2014. Fasting glucose was 93, Hb 13.8 hematocrit 40.8. Cholesterol 106,triglycerides 73, HDL 43, and LDL 48 on current therapy. TSH was normal at 1.2193 I have recommended Mr. Halt to continue his current medical regimen. He is not having anginal symptoms or change in shortness of breath. I will see him in 6 months or call evaluation. In 2015we will most likely will repeat a  nuclear perfusion study, but  I will not schedule this presently.   Lennette Bihari, MD, [facility] 07/25/2013 8:46 AM

## 2013-07-26 ENCOUNTER — Other Ambulatory Visit: Payer: Self-pay | Admitting: Neurology

## 2013-07-26 ENCOUNTER — Encounter: Payer: Self-pay | Admitting: Cardiovascular Disease

## 2013-09-03 ENCOUNTER — Ambulatory Visit (INDEPENDENT_AMBULATORY_CARE_PROVIDER_SITE_OTHER): Payer: Medicare Other | Admitting: Family Medicine

## 2013-09-03 ENCOUNTER — Encounter: Payer: Self-pay | Admitting: Family Medicine

## 2013-09-03 VITALS — BP 140/76 | HR 105 | Temp 98.5°F | Wt 262.0 lb

## 2013-09-03 DIAGNOSIS — I10 Essential (primary) hypertension: Secondary | ICD-10-CM

## 2013-09-03 DIAGNOSIS — M1990 Unspecified osteoarthritis, unspecified site: Secondary | ICD-10-CM

## 2013-09-03 DIAGNOSIS — Z23 Encounter for immunization: Secondary | ICD-10-CM

## 2013-09-03 DIAGNOSIS — Z9109 Other allergy status, other than to drugs and biological substances: Secondary | ICD-10-CM | POA: Insufficient documentation

## 2013-09-03 MED ORDER — HYDROCODONE-ACETAMINOPHEN 5-325 MG PO TABS: 1.0000 | ORAL_TABLET | Freq: Four times a day (QID) | ORAL | Status: DC | PRN

## 2013-09-03 MED ORDER — FLUTICASONE PROPIONATE 50 MCG/ACT NA SUSP: 2.0000 | Freq: Every day | NASAL | Status: DC

## 2013-09-03 NOTE — Progress Notes (Signed)
Pre visit review using our clinic review tool, if applicable. No additional management support is needed unless otherwise documented below in the visit note. 

## 2013-09-03 NOTE — Addendum Note (Signed)
Addended by: Azucena Freed on: 09/03/2013 04:30 PM   Modules accepted: Orders

## 2013-09-03 NOTE — Progress Notes (Signed)
   Subjective:    Patient ID: [name], male    DOB: [birth year], 79 y.o.   MRN: [id number]  HPI Here for several issues. He still has daily trouble with PND and stuffy nose despite taking an antihistamine daily. No cough or fever. He needs a flu shot and his pain meds refilled.    Review of Systems  Constitutional: Negative.   HENT: Positive for congestion, postnasal drip and sinus pressure.   Eyes: Negative.   Respiratory: Negative.        Objective:   Physical Exam  Constitutional: He appears well-developed and well-nourished.  HENT:  Right Ear: External ear normal.  Left Ear: External ear normal.  Nose: Nose normal.  Mouth/Throat: Oropharynx is clear and moist.  Eyes: Conjunctivae are normal.  Pulmonary/Chest: Effort normal and breath sounds normal. No respiratory distress. He has no wheezes. He has no rales.  Lymphadenopathy:    He has no cervical adenopathy.          Assessment & Plan:  Try Flonase sprays daily. Refilled meds.

## 2013-09-24 ENCOUNTER — Other Ambulatory Visit: Payer: Self-pay | Admitting: *Deleted

## 2013-09-24 MED ORDER — PANTOPRAZOLE SODIUM 40 MG PO TBEC: 40.0000 mg | DELAYED_RELEASE_TABLET | Freq: Two times a day (BID) | ORAL | Status: DC | PRN

## 2013-09-24 NOTE — Telephone Encounter (Signed)
Rx was sent to pharmacy electronically. 

## 2013-11-12 ENCOUNTER — Other Ambulatory Visit: Payer: Self-pay

## 2013-11-12 MED ORDER — ROSUVASTATIN CALCIUM 10 MG PO TABS: 10.0000 mg | ORAL_TABLET | Freq: Every day | ORAL | Status: DC

## 2013-11-12 NOTE — Telephone Encounter (Signed)
Rx was sent to pharmacy electronically. 

## 2013-12-10 ENCOUNTER — Other Ambulatory Visit: Payer: Self-pay | Admitting: Neurology

## 2013-12-27 ENCOUNTER — Ambulatory Visit (INDEPENDENT_AMBULATORY_CARE_PROVIDER_SITE_OTHER): Payer: Medicare Other | Admitting: Neurology

## 2013-12-27 ENCOUNTER — Encounter (INDEPENDENT_AMBULATORY_CARE_PROVIDER_SITE_OTHER): Payer: Self-pay

## 2013-12-27 ENCOUNTER — Encounter: Payer: Self-pay | Admitting: Neurology

## 2013-12-27 VITALS — BP 137/71 | HR 80 | Wt 260.0 lb

## 2013-12-27 DIAGNOSIS — G7000 Myasthenia gravis without (acute) exacerbation: Secondary | ICD-10-CM

## 2013-12-27 DIAGNOSIS — Z5181 Encounter for therapeutic drug level monitoring: Secondary | ICD-10-CM

## 2013-12-27 DIAGNOSIS — R6889 Other general symptoms and signs: Secondary | ICD-10-CM

## 2013-12-27 MED ORDER — PREDNISONE 1 MG PO TABS: ORAL_TABLET | ORAL | Status: DC

## 2013-12-27 NOTE — Patient Instructions (Addendum)
  With the prednisone, 2.5 mg tablets begin 2 tablets every other day. I called in a Rx for the 1 mg prednisone tablets. Follow directions on the bottle.   Myasthenia Gravis Myasthenia gravis is a disease that causes muscle weakness throughout the body. The muscles affected are the ones we can control (voluntary muscles). An example of a voluntary muscle is your hand muscles. You can control the muscles to make the hand pick something up. An example of an involuntary muscle is the heart. The heart beats without any direction from you.  Myasthenia Gravis is thought to be an autoimmune disease. That means that normal defenses of the body begin to attack the body. In this case, the immune system begins to attack cells located at the junctions of the muscles and the nerves. Women are affected more often. Women are affected at a younger age than men. Babies born to affected women frequently develop symptoms at an early age. SYMPTOMS Initially in the disease, the facial muscles are affected first. After this, a person may develop droopy eyelids. They may have difficulty controlling facial muscles. They may have problems chewing. Swallowing and speaking may become impaired. The weakness gradually spreads to the arms and legs. It begins to affect breathing. Sometimes, the symptoms lessen or go away without any apparent cause. DIAGNOSIS  Diagnosis can be made with blood tests. Tests such as electromyography may be done to examine the electrical activity in the muscle. An improvement in symptoms after having an anti-cholinesterase drug helps confirm the diagnosis.  TREATMENT  Medicines are usually prescribed as the first treatment. These medicines help, but they do not cure the disease. A plasma cleansing procedure (plasmapheresis) can be used to treat a crisis. It can also be used to prepare a person for surgery. This procedure produces short-term improvement. Some cases are helped by removing the thymus gland.  Steroids are used for short-term benefits. Document Released: 12/27/2000 Document Revised: 12/13/2011 Document Reviewed: 09/20/2005 [facility] Patient Information 2014 [place].

## 2013-12-27 NOTE — Progress Notes (Signed)
Reason for visit: Myasthenia gravis  [name] is an 80 y.o. male  History of present illness:  [name] is an 80 year old right-handed white male with a history of myasthenia gravis. The patient is doing well with his myasthenia, occasionally having some brief double vision. The patient denies any problems with chewing or swallowing. The patient is having no problems with strength of the extremities. The patient remains on prednisone, CellCept, and Mestinon. The patient is tolerating these medications well. The patient returns for an evaluation.  Past Medical History  Diagnosis Date  . Coronary artery disease   . Diverticulitis   . GERD (gastroesophageal reflux disease)   . Benign prostatic hypertrophy   . Partial small bowel obstruction 04/16/10    resolved with bowel rest  . Hypertension     Dr. Rollene Nolan- within a month or 2 to clear for surgery   . Shortness of breath   . Pulmonary embolus     2000  . Arthritis     back, hips, knees   . Cancer     h/o renal carcinoma   . Chronic kidney disease     h/o UTI-2012  . Obesity   . Myasthenia gravis     Dr. Jannifer Nolan  . Osteoporosis     Past Surgical History  Procedure Laterality Date  . Total knee arthroplasty      both Sees Dr. Novella Nolan  . Nephrectomy      left  . Angioplasty      had 2 stent replacements  . Stented placed  1/08  . Colonoscopy  05/28/10    per Dr. Laurence Nolan, diverticulosis, no repeats planned   . Sp kyphoplasty      and vertebroplasty  . Cardiac catheterization      2008, stents   . Eye surgery      cataracts(bilateral)  removed, ?iol  . Tonsillectomy    . Joint replacement      2005-R, L knee replacement- 2000  . Total knee revision  03/28/2012    Procedure: TOTAL KNEE REVISION;  Surgeon: Gerald Dibble, MD;  Location: [facility];  Service: Orthopedics;  Laterality: Right;    Family History  Problem Relation Age of Onset  . Colon cancer Mother     Social history:  reports that he  quit smoking about 54 years ago. His smoking use included Cigarettes. He smoked 0.00 packs per day. He has never used smokeless tobacco. He reports that he does not drink alcohol or use illicit drugs.    Allergies  Allergen Reactions  . Dicyclomine Other (See Comments)    REACTION: can worsen myasthenia gravis    Medications:  Current Outpatient Prescriptions on File Prior to Visit  Medication Sig Dispense Refill  . aspirin 81 MG tablet Take 81 mg by mouth daily.      . calcium-vitamin D (OSCAL WITH D) 500-200 MG-UNIT per tablet Take 1 tablet by mouth 4 (four) times daily.       . clopidogrel (PLAVIX) 75 MG tablet Take 75 mg by mouth daily.      . fluticasone (FLONASE) 50 MCG/ACT nasal spray Place 2 sprays into both nostrils daily.  16 g  11  . HYDROcodone-acetaminophen (NORCO/VICODIN) 5-325 MG per tablet Take 1 tablet by mouth every 6 (six) hours as needed for moderate pain.  120 tablet  0  . metoprolol succinate (TOPROL-XL) 50 MG 24 hr tablet Take 50 mg by mouth daily after breakfast. Take with or  immediately following a meal.      . Multiple Vitamin (MULTIVITAMIN WITH MINERALS) TABS Take 1 tablet by mouth daily.      . mycophenolate (CELLCEPT) 500 MG tablet TAKE 1 TABLET EVERY MORNING AND 2 TABLETS AT BEDTIME.  90 tablet  11  . omega-3 acid ethyl esters (LOVAZA) 1 G capsule Take 2 g by mouth daily.      . pantoprazole (PROTONIX) 40 MG tablet Take 1 tablet (40 mg total) by mouth 2 (two) times daily as needed. For upset stomach  60 tablet  9  . polyethylene glycol (MIRALAX / GLYCOLAX) packet Take 17 g by mouth daily as needed. For constipation      . promethazine (PHENERGAN) 25 MG tablet Take 25 mg by mouth every 6 (six) hours as needed. For nausea      . pyridostigmine (MESTINON) 60 MG tablet TAKE  (1)  TABLET  FOUR TIMES DAILY.  120 tablet  11  . rosuvastatin (CRESTOR) 10 MG tablet Take 1 tablet (10 mg total) by mouth daily after breakfast.  30 tablet  8  .  triamterene-hydrochlorothiazide (DYAZIDE) 37.5-25 MG per capsule Take 1 capsule by mouth every morning.       No current facility-administered medications on file prior to visit.    ROS:  Out of a complete 14 system review of symptoms, the patient complains only of the following symptoms, and all other reviewed systems are negative.  Double vision  Blood pressure 137/71, pulse 80, weight 260 lb (117.935 kg).  Physical Exam  General: The patient is alert and cooperative at the time of the examination.  Skin: No significant peripheral edema is noted.   Neurologic Exam  Mental status: The patient is oriented x 3.  Cranial nerves: Facial symmetry is present. Speech is normal, no aphasia or dysarthria is noted. Extraocular movements are full. Visual fields are full. The superior deviation for 1 minute, the patient reports subjective double vision after 15 seconds.  Motor: The patient has good strength in all 4 extremities. With abduction of the arms for 1 minute, there is no fatigable weakness of the deltoid muscles.  Sensory examination: Soft touch sensation is symmetric on the face, arms, and legs.  Coordination: The patient has good finger-nose-finger and heel-to-shin bilaterally.  Gait and station: The patient has a normal gait. Tandem gait is unsteady. Romberg is negative. No drift is seen.  Reflexes: Deep tendon reflexes are symmetric.   Assessment/Plan:  1. Myasthenia gravis  The patient is doing well currently with his myasthenia gravis. The patient will continue his medications. Blood work will be done today, and the patient will initiate a slow taper of prednisone going to 7 mg every other day for 3 months, 6 mg every other day for 3 months, and then 5 mg every other day. The patient will followup through this office in 6 months.  Gerald Alexanders MD 12/27/2013 8:02 PM  [facility] [address] [place], [place] [address]  Phone  [phone] Fax [phone]

## 2013-12-28 LAB — COMPREHENSIVE METABOLIC PANEL
A/G RATIO: 1.5 (ref 1.1–2.5)
ALT: 22 IU/L (ref 0–44)
AST: 26 IU/L (ref 0–40)
Albumin: 4 g/dL (ref 3.5–4.7)
Alkaline Phosphatase: 62 IU/L (ref 39–117)
BUN/Creatinine Ratio: 20 (ref 10–22)
BUN: 21 mg/dL (ref 8–27)
CALCIUM: 9.1 mg/dL (ref 8.6–10.2)
CO2: 20 mmol/L (ref 18–29)
CREATININE: 1.05 mg/dL (ref 0.76–1.27)
Chloride: 104 mmol/L (ref 97–108)
GFR calc Af Amer: 74 mL/min/{1.73_m2} (ref >59)
GFR, EST NON AFRICAN AMERICAN: 64 mL/min/{1.73_m2} (ref >59)
GLOBULIN, TOTAL: 2.7 g/dL (ref 1.5–4.5)
Glucose: 95 mg/dL (ref 65–99)
Potassium: 4.1 mmol/L (ref 3.5–5.2)
SODIUM: 142 mmol/L (ref 134–144)
Total Bilirubin: 0.6 mg/dL (ref 0.0–1.2)
Total Protein: 6.7 g/dL (ref 6.0–8.5)

## 2013-12-28 LAB — CBC WITH DIFFERENTIAL
BASOS: 1 %
Basophils Absolute: 0 10*3/uL (ref 0.0–0.2)
EOS ABS: 0.3 10*3/uL (ref 0.0–0.4)
Eos: 8 %
HEMATOCRIT: 43.3 % (ref 37.5–51.0)
HEMOGLOBIN: 14.4 g/dL (ref 12.6–17.7)
Immature Grans (Abs): 0 10*3/uL (ref 0.0–0.1)
Immature Granulocytes: 0 %
LYMPHS ABS: 1 10*3/uL (ref 0.7–3.1)
Lymphs: 29 %
MCH: 30.6 pg (ref 26.6–33.0)
MCHC: 33.3 g/dL (ref 31.5–35.7)
MCV: 92 fL (ref 79–97)
MONOS ABS: 0.6 10*3/uL (ref 0.1–0.9)
Monocytes: 16 %
NEUTROS ABS: 1.6 10*3/uL (ref 1.4–7.0)
NEUTROS PCT: 46 %
Platelets: 182 10*3/uL (ref 150–379)
RBC: 4.7 x10E6/uL (ref 4.14–5.80)
RDW: 14.4 % (ref 12.3–15.4)
WBC: 3.5 10*3/uL (ref 3.4–10.8)

## 2013-12-28 LAB — TSH: TSH: 2.4 u[IU]/mL (ref 0.450–4.500)

## 2013-12-28 NOTE — Progress Notes (Signed)
Quick Note:  Shared unremarkable results per Dr Tobey Grim findings thru VM message ______

## 2014-01-15 ENCOUNTER — Encounter (HOSPITAL_COMMUNITY): Payer: Self-pay | Admitting: Emergency Medicine

## 2014-01-15 ENCOUNTER — Emergency Department (HOSPITAL_COMMUNITY)
Admission: EM | Admit: 2014-01-15 | Discharge: 2014-01-15 | Disposition: A | Discharge status: Home / Self Care | Payer: Medicare Other | Attending: Emergency Medicine | Admitting: Emergency Medicine

## 2014-01-15 DIAGNOSIS — Z86711 Personal history of pulmonary embolism: Secondary | ICD-10-CM | POA: Insufficient documentation

## 2014-01-15 DIAGNOSIS — N390 Urinary tract infection, site not specified: Secondary | ICD-10-CM | POA: Insufficient documentation

## 2014-01-15 DIAGNOSIS — N419 Inflammatory disease of prostate, unspecified: Secondary | ICD-10-CM

## 2014-01-15 DIAGNOSIS — Z7902 Long term (current) use of antithrombotics/antiplatelets: Secondary | ICD-10-CM | POA: Insufficient documentation

## 2014-01-15 DIAGNOSIS — Z7982 Long term (current) use of aspirin: Secondary | ICD-10-CM | POA: Insufficient documentation

## 2014-01-15 DIAGNOSIS — I129 Hypertensive chronic kidney disease with stage 1 through stage 4 chronic kidney disease, or unspecified chronic kidney disease: Secondary | ICD-10-CM | POA: Insufficient documentation

## 2014-01-15 DIAGNOSIS — Z792 Long term (current) use of antibiotics: Secondary | ICD-10-CM | POA: Insufficient documentation

## 2014-01-15 DIAGNOSIS — E669 Obesity, unspecified: Secondary | ICD-10-CM | POA: Insufficient documentation

## 2014-01-15 DIAGNOSIS — Z87891 Personal history of nicotine dependence: Secondary | ICD-10-CM | POA: Insufficient documentation

## 2014-01-15 DIAGNOSIS — K219 Gastro-esophageal reflux disease without esophagitis: Secondary | ICD-10-CM | POA: Insufficient documentation

## 2014-01-15 DIAGNOSIS — Z85528 Personal history of other malignant neoplasm of kidney: Secondary | ICD-10-CM | POA: Insufficient documentation

## 2014-01-15 DIAGNOSIS — Z9889 Other specified postprocedural states: Secondary | ICD-10-CM | POA: Insufficient documentation

## 2014-01-15 DIAGNOSIS — Z8669 Personal history of other diseases of the nervous system and sense organs: Secondary | ICD-10-CM | POA: Insufficient documentation

## 2014-01-15 DIAGNOSIS — N189 Chronic kidney disease, unspecified: Secondary | ICD-10-CM | POA: Insufficient documentation

## 2014-01-15 DIAGNOSIS — I2510 Atherosclerotic heart disease of native coronary artery without angina pectoris: Secondary | ICD-10-CM | POA: Insufficient documentation

## 2014-01-15 DIAGNOSIS — IMO0002 Reserved for concepts with insufficient information to code with codable children: Secondary | ICD-10-CM | POA: Insufficient documentation

## 2014-01-15 DIAGNOSIS — M129 Arthropathy, unspecified: Secondary | ICD-10-CM | POA: Insufficient documentation

## 2014-01-15 DIAGNOSIS — Z79899 Other long term (current) drug therapy: Secondary | ICD-10-CM | POA: Insufficient documentation

## 2014-01-15 LAB — CBC WITH DIFFERENTIAL/PLATELET
Basophils Absolute: 0 10*3/uL (ref 0.0–0.1)
Basophils Relative: 0 % (ref 0–1)
Eosinophils Absolute: 0.1 10*3/uL (ref 0.0–0.7)
Eosinophils Relative: 4 % (ref 0–5)
HCT: 42.8 % (ref 39.0–52.0)
Hemoglobin: 14.2 g/dL (ref 13.0–17.0)
Lymphocytes Relative: 27 % (ref 12–46)
Lymphs Abs: 1 10*3/uL (ref 0.7–4.0)
MCH: 31.3 pg (ref 26.0–34.0)
MCHC: 33.2 g/dL (ref 30.0–36.0)
MCV: 94.5 fL (ref 78.0–100.0)
Monocytes Absolute: 0.6 10*3/uL (ref 0.1–1.0)
Monocytes Relative: 17 % — ABNORMAL HIGH (ref 3–12)
Neutro Abs: 1.9 10*3/uL (ref 1.7–7.7)
Neutrophils Relative %: 51 % (ref 43–77)
Platelets: 183 10*3/uL (ref 150–400)
RBC: 4.53 MIL/uL (ref 4.22–5.81)
RDW: 13.8 % (ref 11.5–15.5)
WBC: 3.6 10*3/uL — ABNORMAL LOW (ref 4.0–10.5)

## 2014-01-15 LAB — BASIC METABOLIC PANEL
BUN: 22 mg/dL (ref 6–23)
CO2: 22 mEq/L (ref 19–32)
Calcium: 9.2 mg/dL (ref 8.4–10.5)
Chloride: 104 mEq/L (ref 96–112)
Creatinine, Ser: 1 mg/dL (ref 0.50–1.35)
GFR calc Af Amer: 76 mL/min — ABNORMAL LOW (ref >90)
GFR calc non Af Amer: 66 mL/min — ABNORMAL LOW (ref >90)
Glucose, Bld: 98 mg/dL (ref 70–99)
Potassium: 4.1 mEq/L (ref 3.7–5.3)
Sodium: 137 mEq/L (ref 137–147)

## 2014-01-15 LAB — URINALYSIS, ROUTINE W REFLEX MICROSCOPIC
Bilirubin Urine: NEGATIVE
Glucose, UA: NEGATIVE mg/dL
Ketones, ur: NEGATIVE mg/dL
Nitrite: POSITIVE — AB
Protein, ur: 100 mg/dL — AB
Specific Gravity, Urine: 1.028 (ref 1.005–1.030)
Urobilinogen, UA: 0.2 mg/dL (ref 0.0–1.0)
pH: 5.5 (ref 5.0–8.0)

## 2014-01-15 LAB — URINE MICROSCOPIC-ADD ON

## 2014-01-15 MED ORDER — SODIUM CHLORIDE 0.9 % IV BOLUS (SEPSIS)
1000.0000 mL | Freq: Once | INTRAVENOUS | Status: AC
  Administered 2014-01-15: 1000 mL via INTRAVENOUS

## 2014-01-15 MED ORDER — SODIUM CHLORIDE 0.9 % IV SOLN
Freq: Once | INTRAVENOUS | Status: AC
  Administered 2014-01-15: 20:00:00 via INTRAVENOUS

## 2014-01-15 MED ORDER — SULFAMETHOXAZOLE-TRIMETHOPRIM 800-160 MG PO TABS: 1.0000 | ORAL_TABLET | Freq: Two times a day (BID) | ORAL | Status: DC

## 2014-01-15 MED ORDER — DEXTROSE 5 % IV SOLN
1.0000 g | Freq: Once | INTRAVENOUS | Status: AC
  Administered 2014-01-15: 1 g via INTRAVENOUS
  Filled 2014-01-15: qty 10

## 2014-01-15 NOTE — ED Notes (Signed)
Pt states he was diagnosed with UTI on Friday and was discharged with a prescription for cipro. Pt states the abx has not helped. Pt states he has one kidney. Pt states he has burning with urination.

## 2014-01-15 NOTE — ED Provider Notes (Signed)
CSN: [id number]     Arrival date & time 01/15/14  1615 History   First MD Initiated Contact with Patient 01/15/14 1630     Chief Complaint  Patient presents with  . Dysuria     (Consider location/radiation/quality/duration/timing/severity/associated sxs/prior Treatment) Patient is a 80 y.o. male presenting with dysuria.  Dysuria Pertinent negatives include no abdominal pain, chills, fever, nausea, vomiting or weakness.   [name] is an 76 yom with pmhx of frequent UTI, L nephrectomy who presents with 1wk of urinary burning, frequency.  Pt states he was seen by his PCP last week and was given an Rx for Cipro.  He states he began the cipro on Friday, and is now on his 5th day of abx with no relief in his s/s.  He reports his PCP was not in the office today, and wished to be seen in the ER.  Pt reports having a uroscopy appx 1 year ago which was clear, denies prostate problems and has not been given an answer as to why he is experiencing frequent UTI.  Pt denies nausea, vomiting, fever, back pain, constipation, diarrhea, abd pain.    Past Medical History  Diagnosis Date  . Coronary artery disease   . Diverticulitis   . GERD (gastroesophageal reflux disease)   . Benign prostatic hypertrophy   . Partial small bowel obstruction 04/16/10    resolved with bowel rest  . Hypertension     Dr. Rollene Fare- within a month or 2 to clear for surgery   . Shortness of breath   . Pulmonary embolus     2000  . Arthritis     back, hips, knees   . Cancer     h/o renal carcinoma   . Chronic kidney disease     h/o UTI-2012  . Obesity   . Myasthenia gravis     Dr. Jannifer Franklin  . Osteoporosis    Past Surgical History  Procedure Laterality Date  . Total knee arthroplasty      both Sees Dr. Novella Olive  . Nephrectomy      left  . Angioplasty      had 2 stent replacements  . Stented placed  1/08  . Colonoscopy  05/28/10    per Dr. Laurence Spates, diverticulosis, no repeats planned   . Sp kyphoplasty     and vertebroplasty  . Cardiac catheterization      2008, stents   . Eye surgery      cataracts(bilateral)  removed, ?iol  . Tonsillectomy    . Joint replacement      2005-R, L knee replacement- 2000  . Total knee revision  03/28/2012    Procedure: TOTAL KNEE REVISION;  Surgeon: Hessie Dibble, MD;  Location: [facility];  Service: Orthopedics;  Laterality: Right;   Family History  Problem Relation Age of Onset  . Colon cancer Mother    History  Substance Use Topics  . Smoking status: Former Smoker    Types: Cigarettes    Quit date: 03/22/1959  . Smokeless tobacco: Never Used     Comment: quit 1970s  . Alcohol Use: No    Review of Systems  Constitutional: Negative for fever, chills, activity change and appetite change.  Respiratory: Negative for shortness of breath.   Cardiovascular: Negative for leg swelling.  Gastrointestinal: Negative for nausea, vomiting, abdominal pain, diarrhea and constipation.  Genitourinary: Positive for dysuria and frequency. Negative for discharge and penile pain.  Skin: Negative for color change.  Neurological: Negative for  weakness.  Psychiatric/Behavioral: Negative for confusion.   All other systems negative except as documented in the HPI. All pertinent positives and negatives as reviewed in the HPI.    Allergies  Dicyclomine  Home Medications   Prior to Admission medications   Medication Sig Start Date End Date Taking? Authorizing Provider  ciprofloxacin (CIPRO) 500 MG tablet Take 500 mg by mouth 2 (two) times daily. 10 day supply with refills   Yes Historical Provider, MD  clopidogrel (PLAVIX) 75 MG tablet Take 75 mg by mouth daily.   Yes Historical Provider, MD  fluticasone (FLONASE) 50 MCG/ACT nasal spray Place 2 sprays into both nostrils daily. 09/03/13  Yes [name], MD  HYDROcodone-acetaminophen (NORCO/VICODIN) 5-325 MG per tablet Take 1 tablet by mouth every 6 (six) hours as needed for moderate pain. 09/03/13  Yes [name], MD   metoprolol succinate (TOPROL-XL) 50 MG 24 hr tablet Take 50 mg by mouth daily after breakfast. Take with or immediately following a meal.   Yes Historical Provider, MD  Multiple Vitamin (MULTIVITAMIN WITH MINERALS) TABS Take 1 tablet by mouth daily.   Yes Historical Provider, MD  mycophenolate (CELLCEPT) 500 MG tablet Take 500-1,000 mg by mouth 2 (two) times daily. Take 500 mg every morning and 1000 mg at bedtime.   Yes Historical Provider, MD  omega-3 acid ethyl esters (LOVAZA) 1 G capsule Take 2 g by mouth daily.   Yes Historical Provider, MD  pantoprazole (PROTONIX) 40 MG tablet Take 40 mg by mouth 2 (two) times daily as needed. For upset stomach 09/24/13  Yes Troy Sine, MD  predniSONE (DELTASONE) 2.5 MG tablet Take 7.5 mg by mouth every other day.   Yes Historical Provider, MD  pyridostigmine (MESTINON) 60 MG tablet TAKE  (1)  TABLET  FOUR TIMES DAILY. 01/19/13  Yes Kathrynn Ducking, MD  rosuvastatin (CRESTOR) 10 MG tablet Take 1 tablet (10 mg total) by mouth daily after breakfast. 11/12/13  Yes Troy Sine, MD  aspirin 81 MG tablet Take 81 mg by mouth daily.    Historical Provider, MD  polyethylene glycol (MIRALAX / GLYCOLAX) packet Take 17 g by mouth daily as needed. For constipation    Historical Provider, MD  predniSONE (DELTASONE) 1 MG tablet Begin 2 tablets every other day for 3 months, then take one tablet every other day for 3 months, then stop 12/27/13   Kathrynn Ducking, MD   BP 140/73  Pulse 92  Temp(Src) 98.2 F (36.8 C) (Oral)  Resp 16  SpO2 96% Physical Exam  Nursing note and vitals reviewed. Constitutional: He is oriented to person, place, and time. He appears well-developed and well-nourished. No distress.  HENT:  Head: Normocephalic and atraumatic.  Mouth/Throat: Oropharynx is clear and moist.  Eyes: Pupils are equal, round, and reactive to light.  Neck: Normal range of motion. Neck supple. No JVD present.  Cardiovascular: Normal rate, regular rhythm and intact  distal pulses.  Exam reveals no gallop and no friction rub.   No murmur heard. Pulses:      Posterior tibial pulses are 2+ on the right side, and 2+ on the left side.  3+ pedal edema noted.    Pulmonary/Chest: Effort normal and breath sounds normal.  Abdominal: Soft. Bowel sounds are normal. He exhibits no mass. There is no tenderness. There is no rigidity, no rebound, no guarding and negative Murphy's sign.  Neurological: He is alert and oriented to person, place, and time.  Skin: Skin is warm  and dry. He is not diaphoretic. No pallor.  Psychiatric: He has a normal mood and affect.    ED Course  Procedures (including critical care time) Labs Review Labs Reviewed  URINE CULTURE  CBC WITH DIFFERENTIAL  URINALYSIS, ROUTINE W REFLEX MICROSCOPIC  BASIC METABOLIC PANEL    Patient will be discharged home in his feeling better at this time.  Patient is advised of his primary care Dr. told to increase his fluid intake.  Will treat for prostatitis.  Based on the fact that he had his recurrent infections  Brent General, PA-C 01/17/14 1533

## 2014-01-15 NOTE — Discharge Instructions (Signed)
Follow-up with your urologist.  

## 2014-01-18 LAB — URINE CULTURE: Colony Count: >=100000

## 2014-01-19 NOTE — ED Provider Notes (Signed)
I personally performed the services described in this documentation, which was scribed in my presence. The recorded information has been reviewed and is accurate.   Tanna Furry, MD 01/19/14 [phone]

## 2014-01-20 ENCOUNTER — Telehealth (HOSPITAL_BASED_OUTPATIENT_CLINIC_OR_DEPARTMENT_OTHER): Payer: Self-pay | Admitting: Emergency Medicine

## 2014-01-20 NOTE — Telephone Encounter (Signed)
Post ED Visit - Positive Culture Follow-up  Culture report reviewed by antimicrobial stewardship pharmacist: [x]  Wes Ute, Pharm.D., BCPS []  Heide Guile, Pharm.D., BCPS []  Alycia Rossetti, Pharm.D., BCPS []  [place], [place].D., BCPS, AAHIVP []  Legrand Como, Pharm.D., BCPS, AAHIVP []  Juliene Pina, Pharm.D.  Positive urine culture Treated with Cipro, organism sensitive to the same and no further patient follow-up is required at this time.  Delmus Warwick 01/20/2014, 10:32 AM

## 2014-01-30 ENCOUNTER — Other Ambulatory Visit: Payer: Self-pay | Admitting: Neurology

## 2014-01-31 ENCOUNTER — Other Ambulatory Visit: Payer: Self-pay | Admitting: Neurology

## 2014-01-31 ENCOUNTER — Telehealth: Payer: Self-pay | Admitting: Family Medicine

## 2014-01-31 NOTE — Telephone Encounter (Signed)
Pt is needing new rx HYDROcodone-acetaminophen (NORCO/VICODIN) 5-325 MG per tablet, please call when available for pick up. ° °

## 2014-02-01 MED ORDER — HYDROCODONE-ACETAMINOPHEN 5-325 MG PO TABS: 1.0000 | ORAL_TABLET | Freq: Four times a day (QID) | ORAL | Status: DC | PRN

## 2014-02-01 NOTE — Telephone Encounter (Signed)
Script is ready for pick up and I spoke with pt.  

## 2014-02-01 NOTE — Telephone Encounter (Signed)
done

## 2014-02-14 ENCOUNTER — Telehealth: Payer: Self-pay | Admitting: Cardiovascular Disease

## 2014-02-14 NOTE — Telephone Encounter (Signed)
Refill done for Metoprolol to [facility] (there phones and computers have been down for several days)#30 w/5 refills.  Changed from Dr. Rollene Fare to Dr. Claiborne Billings.

## 2014-02-15 ENCOUNTER — Encounter: Payer: Self-pay | Admitting: Neurology

## 2014-02-21 ENCOUNTER — Emergency Department (INDEPENDENT_AMBULATORY_CARE_PROVIDER_SITE_OTHER)
Admission: EM | Admit: 2014-02-21 | Discharge: 2014-02-21 | Disposition: A | Discharge status: Home / Self Care | Payer: Medicare Other | Source: Home / Self Care | Attending: Family Medicine | Admitting: Family Medicine

## 2014-02-21 ENCOUNTER — Emergency Department (INDEPENDENT_AMBULATORY_CARE_PROVIDER_SITE_OTHER): Payer: Medicare Other

## 2014-02-21 ENCOUNTER — Encounter (HOSPITAL_COMMUNITY): Payer: Self-pay | Admitting: Emergency Medicine

## 2014-02-21 DIAGNOSIS — B372 Candidiasis of skin and nail: Secondary | ICD-10-CM

## 2014-02-21 DIAGNOSIS — N390 Urinary tract infection, site not specified: Secondary | ICD-10-CM

## 2014-02-21 LAB — POCT I-STAT, CHEM 8
BUN: 21 mg/dL (ref 6–23)
CALCIUM ION: 1.24 mmol/L (ref 1.13–1.30)
Chloride: 108 mEq/L (ref 96–112)
Creatinine, Ser: 1.1 mg/dL (ref 0.50–1.35)
Glucose, Bld: 98 mg/dL (ref 70–99)
HEMATOCRIT: 44 % (ref 39.0–52.0)
HEMOGLOBIN: 15 g/dL (ref 13.0–17.0)
Potassium: 4.3 mEq/L (ref 3.7–5.3)
SODIUM: 144 meq/L (ref 137–147)
TCO2: 23 mmol/L (ref 0–100)

## 2014-02-21 LAB — POCT URINALYSIS DIP (DEVICE)
BILIRUBIN URINE: NEGATIVE
BILIRUBIN URINE: NEGATIVE
Glucose, UA: NEGATIVE mg/dL
Glucose, UA: NEGATIVE mg/dL
KETONES UR: NEGATIVE mg/dL
Ketones, ur: NEGATIVE mg/dL
NITRITE: POSITIVE — AB
Nitrite: POSITIVE — AB
PH: 5.5 (ref 5.0–8.0)
Protein, ur: 100 mg/dL — AB
Protein, ur: 100 mg/dL — AB
Specific Gravity, Urine: >=1.03 (ref 1.005–1.030)
Specific Gravity, Urine: >=1.03 (ref 1.005–1.030)
Urobilinogen, UA: 0.2 mg/dL (ref 0.0–1.0)
Urobilinogen, UA: 0.2 mg/dL (ref 0.0–1.0)
pH: 5.5 (ref 5.0–8.0)

## 2014-02-21 MED ORDER — CEFIXIME 400 MG PO TABS: 400.0000 mg | ORAL_TABLET | Freq: Every day | ORAL | Status: DC

## 2014-02-21 MED ORDER — NYSTATIN-TRIAMCINOLONE 100000-0.1 UNIT/GM-% EX CREA: TOPICAL_CREAM | CUTANEOUS | Status: DC

## 2014-02-21 MED ORDER — CEFIXIME 400 MG PO TABS
400.0000 mg | ORAL_TABLET | Freq: Once | ORAL | Status: AC
  Administered 2014-02-21: 400 mg via ORAL

## 2014-02-21 MED ORDER — CEFIXIME 400 MG PO TABS
ORAL_TABLET | ORAL | Status: AC
  Filled 2014-02-21: qty 1

## 2014-02-21 NOTE — ED Notes (Signed)
Multiple complaints: patient has a red area to right axilla, odor, denies itching.   Patient mentioned to brad, emt his concern for uti.  Recently treated for the same and not sure it has resolved.  Patient continues to have similar symptoms.

## 2014-02-21 NOTE — Discharge Instructions (Signed)
Cutaneous Candidiasis Cutaneous candidiasis is a condition in which there is an overgrowth of yeast (candida) on the skin. Yeast normally live on the skin, but in small enough numbers not to cause any symptoms. In certain cases, increased growth of the yeast may cause an actual yeast infection. This kind of infection usually occurs in areas of the skin that are constantly warm and moist, such as the armpits or the groin. Yeast is the most common cause of diaper rash in babies and in people who cannot control their bowel movements (incontinence). CAUSES  The fungus that most often causes cutaneous candidiasis is Candida albicans. Conditions that can increase the risk of getting a yeast infection of the skin include:  Obesity.  Pregnancy.  Diabetes.  Taking antibiotic medicine.  Taking birth control pills.  Taking steroid medicines.  Thyroid disease.  An iron or zinc deficiency.  Problems with the immune system. SYMPTOMS   Red, swollen area of the skin.  Bumps on the skin.  Itchiness. DIAGNOSIS  The diagnosis of cutaneous candidiasis is usually based on its appearance. Light scrapings of the skin may also be taken and viewed under a microscope to identify the presence of yeast. TREATMENT  Antifungal creams may be applied to the infected skin. In severe cases, oral medicines may be needed.  HOME CARE INSTRUCTIONS   Keep your skin clean and dry.  Maintain a healthy weight.  If you have diabetes, keep your blood sugar under control. SEEK IMMEDIATE MEDICAL CARE IF:  Your rash continues to spread despite treatment.  You have a fever, chills, or abdominal pain. Document Released: 06/08/2011 Document Revised: 12/13/2011 Document Reviewed: 06/08/2011 [facility] Patient Information 2014 [place].  Urinary Tract Infection Urinary tract infections (UTIs) can develop anywhere along your urinary tract. Your urinary tract is your body's drainage system for removing wastes and  extra water. Your urinary tract includes two kidneys, two ureters, a bladder, and a urethra. Your kidneys are a pair of bean-shaped organs. Each kidney is about the size of your fist. They are located below your ribs, one on each side of your spine. CAUSES Infections are caused by microbes, which are microscopic organisms, including fungi, viruses, and bacteria. These organisms are so small that they can only be seen through a microscope. Bacteria are the microbes that most commonly cause UTIs. SYMPTOMS  Symptoms of UTIs may vary by age and gender of the patient and by the location of the infection. Symptoms in young women typically include a frequent and intense urge to urinate and a painful, burning feeling in the bladder or urethra during urination. Older women and men are more likely to be tired, shaky, and weak and have muscle aches and abdominal pain. A fever may mean the infection is in your kidneys. Other symptoms of a kidney infection include pain in your back or sides below the ribs, nausea, and vomiting. DIAGNOSIS To diagnose a UTI, your caregiver will ask you about your symptoms. Your caregiver also will ask to provide a urine sample. The urine sample will be tested for bacteria and white blood cells. White blood cells are made by your body to help fight infection. TREATMENT  Typically, UTIs can be treated with medication. Because most UTIs are caused by a bacterial infection, they usually can be treated with the use of antibiotics. The choice of antibiotic and length of treatment depend on your symptoms and the type of bacteria causing your infection. HOME CARE INSTRUCTIONS  If you were prescribed antibiotics, take  them exactly as your caregiver instructs you. Finish the medication even if you feel better after you have only taken some of the medication.  Drink enough water and fluids to keep your urine clear or pale yellow.  Avoid caffeine, tea, and carbonated beverages. They tend to  irritate your bladder.  Empty your bladder often. Avoid holding urine for long periods of time.  Empty your bladder before and after sexual intercourse.  After a bowel movement, women should cleanse from front to back. Use each tissue only once. SEEK MEDICAL CARE IF:   You have back pain.  You develop a fever.  Your symptoms do not begin to resolve within 3 days. SEEK IMMEDIATE MEDICAL CARE IF:   You have severe back pain or lower abdominal pain.  You develop chills.  You have nausea or vomiting.  You have continued burning or discomfort with urination. MAKE SURE YOU:   Understand these instructions.  Will watch your condition.  Will get help right away if you are not doing well or get worse. Document Released: 06/30/2005 Document Revised: 03/21/2012 Document Reviewed: 10/29/2011 [facility] Patient Information 2014 [place].

## 2014-02-21 NOTE — ED Provider Notes (Signed)
CSN: [id number]     Arrival date & time 02/21/14  1031 History   First MD Initiated Contact with Patient 02/21/14 1054     Chief Complaint  Patient presents with  . Cellulitis  . Urinary Tract Infection   (Consider location/radiation/quality/duration/timing/severity/associated sxs/prior Treatment) HPI Comments: 80 year old male with a history of renal cell carcinoma status post nephrectomy, myasthenia gravis, CAD, hypertension presents for evaluation of possible cellulitis or abscess under his right armpit, also possible urinary tract infection. He has had frequent urinary tract infections and being on CellCept for myasthenia gravis. He also takes chronic prednisone. He just finished his last antibiotics a few days ago and now his dysuria and urinary frequency have started again. Also for the past week he has had redness and a foul odor with some drainage from his right axilla. He denies any fever. He is urinating a normal amount. Denies any leg swelling, cough, chest pain, shortness of breath. He does not smoke, he quit 40 years ago    Past Medical History  Diagnosis Date  . Coronary artery disease   . Diverticulitis   . GERD (gastroesophageal reflux disease)   . Benign prostatic hypertrophy   . Partial small bowel obstruction 04/16/10    resolved with bowel rest  . Hypertension     Dr. Rollene Fare- within a month or 2 to clear for surgery   . Shortness of breath   . Pulmonary embolus     2000  . Arthritis     back, hips, knees   . Cancer     h/o renal carcinoma   . Chronic kidney disease     h/o UTI-2012  . Obesity   . Myasthenia gravis     Dr. Jannifer Franklin  . Osteoporosis    Past Surgical History  Procedure Laterality Date  . Total knee arthroplasty      both Sees Dr. Novella Olive  . Nephrectomy      left  . Angioplasty      had 2 stent replacements  . Stented placed  1/08  . Colonoscopy  05/28/10    per Dr. Laurence Spates, diverticulosis, no repeats planned   . Sp kyphoplasty       and vertebroplasty  . Cardiac catheterization      2008, stents   . Eye surgery      cataracts(bilateral)  removed, ?iol  . Tonsillectomy    . Joint replacement      2005-R, L knee replacement- 2000  . Total knee revision  03/28/2012    Procedure: TOTAL KNEE REVISION;  Surgeon: Hessie Dibble, MD;  Location: [place];  Service: Orthopedics;  Laterality: Right;   Family History  Problem Relation Age of Onset  . Colon cancer Mother    History  Substance Use Topics  . Smoking status: Former Smoker    Types: Cigarettes    Quit date: 03/22/1959  . Smokeless tobacco: Never Used     Comment: quit 1970s  . Alcohol Use: No    Review of Systems  Respiratory: Negative for cough and shortness of breath.   Cardiovascular: Negative for chest pain and leg swelling.  Genitourinary: Positive for dysuria and frequency.  Skin: Positive for rash.  All other systems reviewed and are negative.   Allergies  Dicyclomine  Home Medications   Prior to Admission medications   Medication Sig Start Date End Date Taking? Authorizing Provider  aspirin 81 MG tablet Take 81 mg by mouth daily.    Historical Provider, MD  ciprofloxacin (CIPRO) 500 MG tablet Take 500 mg by mouth 2 (two) times daily. 10 day supply with refills    Historical Provider, MD  clopidogrel (PLAVIX) 75 MG tablet Take 75 mg by mouth daily.    Historical Provider, MD  fluticasone (FLONASE) 50 MCG/ACT nasal spray Place 2 sprays into both nostrils daily. 09/03/13   [name], MD  HYDROcodone-acetaminophen (NORCO/VICODIN) 5-325 MG per tablet Take 1 tablet by mouth every 6 (six) hours as needed for moderate pain. 02/01/14   [name], MD  metoprolol succinate (TOPROL-XL) 50 MG 24 hr tablet Take 50 mg by mouth daily after breakfast. Take with or immediately following a meal.    Historical Provider, MD  Multiple Vitamin (MULTIVITAMIN WITH MINERALS) TABS Take 1 tablet by mouth daily.    Historical Provider, MD  mycophenolate (CELLCEPT)  500 MG tablet TAKE 1 TABLET EVERY MORNING AND 2 TABLETS AT BEDTIME.    Kathrynn Ducking, MD  omega-3 acid ethyl esters (LOVAZA) 1 G capsule Take 2 g by mouth daily.    Historical Provider, MD  pantoprazole (PROTONIX) 40 MG tablet Take 40 mg by mouth 2 (two) times daily as needed. For upset stomach 09/24/13   Troy Sine, MD  polyethylene glycol [facility] / GLYCOLAX) packet Take 17 g by mouth daily as needed. For constipation    Historical Provider, MD  predniSONE (DELTASONE) 1 MG tablet Begin 2 tablets every other day for 3 months, then take one tablet every other day for 3 months, then stop 12/27/13   Kathrynn Ducking, MD  predniSONE (DELTASONE) 2.5 MG tablet Take 7.5 mg by mouth every other day.    Historical Provider, MD  pyridostigmine (MESTINON) 60 MG tablet TAKE  (1)  TABLET  FOUR TIMES DAILY. 01/31/14   Kathrynn Ducking, MD  rosuvastatin (CRESTOR) 10 MG tablet Take 1 tablet (10 mg total) by mouth daily after breakfast. 11/12/13   Troy Sine, MD  sulfamethoxazole-trimethoprim (SEPTRA DS) 800-160 MG per tablet Take 1 tablet by mouth 2 (two) times daily. 01/15/14   Resa Miner Lawyer, PA-C   BP 142/58  Pulse 74  Temp(Src) 98.3 F (36.8 C) (Oral)  Resp 20  SpO2 94% Physical Exam  Nursing note and vitals reviewed. Constitutional: He is oriented to person, place, and time. He appears well-developed and well-nourished. No distress.  HENT:  Head: Normocephalic and atraumatic.  Eyes: Conjunctivae are normal. Right eye exhibits no discharge. Left eye exhibits no discharge.  Neck: Normal range of motion. Neck supple. No JVD present. No tracheal deviation present.  Cardiovascular: Normal rate, regular rhythm and normal heart sounds.   Pulmonary/Chest: Tachypnea noted. He is in respiratory distress (mild). He has wheezes (diffuse). He has rales (Right lower lobe).  tachypnea at 22 breaths per minute  Abdominal: Soft. There is no tenderness.  Neurological: He is alert and oriented to person,  place, and time. Coordination normal.  Skin: Skin is warm and dry. Rash noted. He is not diaphoretic.  in the right axilla, there is erythema with crusting and mild drainage consistent with cutaneous candidiasis  Psychiatric: He has a normal mood and affect. Judgment normal.    ED Course  Procedures (including critical care time) Labs Review Labs Reviewed  POCT URINALYSIS DIP (DEVICE) - Abnormal; Notable for the following:    Hgb urine dipstick MODERATE (*)    Protein, ur 100 (*)    Nitrite POSITIVE (*)    Leukocytes, UA LARGE (*)    All  other components within normal limits  POCT URINALYSIS DIP (DEVICE) - Abnormal; Notable for the following:    Hgb urine dipstick MODERATE (*)    Protein, ur 100 (*)    Nitrite POSITIVE (*)    Leukocytes, UA SMALL (*)    All other components within normal limits  URINE CULTURE  POCT I-STAT, CHEM 8    Imaging Review Dg Chest 2 View  02/21/2014   CLINICAL DATA:  Wheezing.  EXAM: CHEST  2 VIEW  COMPARISON:  03/21/2012.  FINDINGS: The cardiac silhouette, mediastinal and hilar contours are stable. There is tortuosity, ectasia and calcification of the thoracic aorta. Chronic bronchitic type lung changes but no acute pulmonary findings. No pleural effusion. The bony thorax is intact. Remote compression fractures of the thoracic spine are noted with vertebral augmentation changes.  IMPRESSION: No acute cardiopulmonary findings.   Electronically Signed   By: Kalman Jewels M.D.   On: 02/21/2014 12:55     MDM   1. UTI (lower urinary tract infection)   2. Candidal intertrigo    Giving 1 dose of Suprax here and discharged with Suprax for 2 weeks the UTI, previous culture was sensitive to cephalosporins. Treat the candida intertrigo with Mycolog-II cream for 2 weeks. Followup with primary care  Very mild respiratory distress, chest x-ray is normal. He says he does not feel more short of breath and usual. This may be his baseline. Followup if  worsening  Meds ordered this encounter  Medications  . nystatin-triamcinolone (MYCOLOG II) cream    Sig: Apply to affected area 3 times daily    Dispense:  30 g    Refill:  1    Order Specific Question:  Supervising Provider    Answer:  Lynne Leader, [place]  . cefixime (SUPRAX) 400 MG tablet    Sig: Take 1 tablet (400 mg total) by mouth daily.    Dispense:  14 tablet    Refill:  0    Order Specific Question:  Supervising Provider    Answer:  Lynne Leader, [place]  . cefixime (SUPRAX) tablet 400 mg    Sig:        Liam Graham, PA-C 02/21/14 1308

## 2014-02-23 ENCOUNTER — Emergency Department (INDEPENDENT_AMBULATORY_CARE_PROVIDER_SITE_OTHER)
Admission: EM | Admit: 2014-02-23 | Discharge: 2014-02-23 | Disposition: A | Discharge status: Home / Self Care | Payer: Medicare Other | Source: Home / Self Care | Attending: Family Medicine | Admitting: Family Medicine

## 2014-02-23 ENCOUNTER — Encounter (HOSPITAL_COMMUNITY): Payer: Self-pay | Admitting: Emergency Medicine

## 2014-02-23 DIAGNOSIS — R21 Rash and other nonspecific skin eruption: Secondary | ICD-10-CM

## 2014-02-23 LAB — URINE CULTURE: Colony Count: >=100000

## 2014-02-23 MED ORDER — KETOCONAZOLE 2 % EX CREA: 1.0000 "application " | TOPICAL_CREAM | Freq: Every day | CUTANEOUS | Status: DC

## 2014-02-23 NOTE — ED Provider Notes (Signed)
Medical screening examination/treatment/procedure(s) were performed by resident physician or non-physician practitioner and as supervising physician I was immediately available for consultation/collaboration.   Pauline Good MD.   Billy Fischer, MD 02/23/14 2002

## 2014-02-23 NOTE — ED Notes (Signed)
Pt reports abscess to right axilla; seen here on 5/21 for similar sx Treated for cellulitis and UTI; given cefixime and nystatin and Mycolog cream Reports drainage and pain/burning Alert w/no signs of acute distress.

## 2014-02-23 NOTE — ED Provider Notes (Signed)
CSN: [id number]     Arrival date & time 02/23/14  1604 History   First MD Initiated Contact with Patient 02/23/14 1748     Chief Complaint  Patient presents with  . Abscess   (Consider location/radiation/quality/duration/timing/severity/associated sxs/prior Treatment) Patient is a 80 y.o. male presenting with abscess. The history is provided by the patient. No language interpreter was used.  Abscess Location:  Shoulder/arm Shoulder/arm abscess location:  R axilla Abscess quality: redness   Abscess quality: not draining and no warmth   Red streaking: no   Progression:  Worsening Chronicity:  New Context: immunosuppression   Relieved by:  Oral antibiotics Worsened by:  Nothing tried Ineffective treatments:  None tried   Past Medical History  Diagnosis Date  . Coronary artery disease   . Diverticulitis   . GERD (gastroesophageal reflux disease)   . Benign prostatic hypertrophy   . Partial small bowel obstruction 04/16/10    resolved with bowel rest  . Hypertension     Dr. Rollene Fare- within a month or 2 to clear for surgery   . Shortness of breath   . Pulmonary embolus     2000  . Arthritis     back, hips, knees   . Cancer     h/o renal carcinoma   . Chronic kidney disease     h/o UTI-2012  . Obesity   . Myasthenia gravis     Dr. Jannifer Franklin  . Osteoporosis    Past Surgical History  Procedure Laterality Date  . Total knee arthroplasty      both Sees Dr. Novella Olive  . Nephrectomy      left  . Angioplasty      had 2 stent replacements  . Stented placed  1/08  . Colonoscopy  05/28/10    per Dr. Laurence Spates, diverticulosis, no repeats planned   . Sp kyphoplasty      and vertebroplasty  . Cardiac catheterization      2008, stents   . Eye surgery      cataracts(bilateral)  removed, ?iol  . Tonsillectomy    . Joint replacement      2005-R, L knee replacement- 2000  . Total knee revision  03/28/2012    Procedure: TOTAL KNEE REVISION;  Surgeon: Hessie Dibble, MD;   Location: [place];  Service: Orthopedics;  Laterality: Right;   Family History  Problem Relation Age of Onset  . Colon cancer Mother    History  Substance Use Topics  . Smoking status: Former Smoker    Types: Cigarettes    Quit date: 03/22/1959  . Smokeless tobacco: Never Used     Comment: quit 1970s  . Alcohol Use: No    Review of Systems  Skin: Positive for rash.  All other systems reviewed and are negative.   Allergies  Dicyclomine  Home Medications   Prior to Admission medications   Medication Sig Start Date End Date Taking? Authorizing Provider  cefixime (SUPRAX) 400 MG tablet Take 1 tablet (400 mg total) by mouth daily. 02/21/14  Yes Liam Graham, PA-C  aspirin 81 MG tablet Take 81 mg by mouth daily.    Historical Provider, MD  ciprofloxacin (CIPRO) 500 MG tablet Take 500 mg by mouth 2 (two) times daily. 10 day supply with refills    Historical Provider, MD  clopidogrel (PLAVIX) 75 MG tablet Take 75 mg by mouth daily.    Historical Provider, MD  fluticasone (FLONASE) 50 MCG/ACT nasal spray Place 2 sprays into both nostrils  daily. 09/03/13   [name], MD  HYDROcodone-acetaminophen (NORCO/VICODIN) 5-325 MG per tablet Take 1 tablet by mouth every 6 (six) hours as needed for moderate pain. 02/01/14   [name], MD  metoprolol succinate (TOPROL-XL) 50 MG 24 hr tablet Take 50 mg by mouth daily after breakfast. Take with or immediately following a meal.    Historical Provider, MD  Multiple Vitamin (MULTIVITAMIN WITH MINERALS) TABS Take 1 tablet by mouth daily.    Historical Provider, MD  mycophenolate (CELLCEPT) 500 MG tablet TAKE 1 TABLET EVERY MORNING AND 2 TABLETS AT BEDTIME.    Kathrynn Ducking, MD  nystatin-triamcinolone [facility] II) cream Apply to affected area 3 times daily 02/21/14   Liam Graham, PA-C  omega-3 acid ethyl esters (LOVAZA) 1 G capsule Take 2 g by mouth daily.    Historical Provider, MD  pantoprazole (PROTONIX) 40 MG tablet Take 40 mg by mouth 2  (two) times daily as needed. For upset stomach 09/24/13   Troy Sine, MD  polyethylene glycol [facility] / GLYCOLAX) packet Take 17 g by mouth daily as needed. For constipation    Historical Provider, MD  predniSONE (DELTASONE) 1 MG tablet Begin 2 tablets every other day for 3 months, then take one tablet every other day for 3 months, then stop 12/27/13   Kathrynn Ducking, MD  predniSONE (DELTASONE) 2.5 MG tablet Take 7.5 mg by mouth every other day.    Historical Provider, MD  pyridostigmine (MESTINON) 60 MG tablet TAKE  (1)  TABLET  FOUR TIMES DAILY. 01/31/14   Kathrynn Ducking, MD  rosuvastatin (CRESTOR) 10 MG tablet Take 1 tablet (10 mg total) by mouth daily after breakfast. 11/12/13   Troy Sine, MD  sulfamethoxazole-trimethoprim (SEPTRA DS) 800-160 MG per tablet Take 1 tablet by mouth 2 (two) times daily. 01/15/14   Resa Miner Lawyer, PA-C   BP 145/82  Pulse 80  Temp(Src) 98.1 F (36.7 C) (Oral)  Resp 18  SpO2 95% Physical Exam  Nursing note and vitals reviewed. Constitutional: He is oriented to person, place, and time. He appears well-developed and well-nourished.  HENT:  Head: Normocephalic.  Neck: Normal range of motion.  Cardiovascular: Normal rate.   Pulmonary/Chest: Effort normal.  Abdominal: He exhibits no distension.  Musculoskeletal: Normal range of motion.  Neurological: He is alert and oriented to person, place, and time.  Skin: Skin is warm.  Red raised rash right axilla,    Psychiatric: He has a normal mood and affect.    ED Course  Procedures (including critical care time) Labs Review Labs Reviewed - No data to display  Imaging Review No results found.   MDM   1. Rash    Dr. Juventino Slovak in to see and examine.   He advised nizoral cream and keep area [place], [place] 02/23/14 1820

## 2014-02-23 NOTE — Discharge Instructions (Signed)

## 2014-02-24 NOTE — ED Notes (Signed)
Urine culture:  >100,000 colonies E. Coli.  Pt. adequately treated with Suprax. Hanley Seamen [facility] 02/24/2014

## 2014-02-25 NOTE — ED Provider Notes (Signed)
Medical screening examination/treatment/procedure(s) were performed by a resident physician or non-physician practitioner and as the supervising physician I was immediately available for consultation/collaboration.  Evan Corey, MD    Evan S Corey, MD 02/25/14 0851 

## 2014-03-12 ENCOUNTER — Other Ambulatory Visit: Payer: Self-pay

## 2014-03-12 MED ORDER — PANTOPRAZOLE SODIUM 40 MG PO TBEC: DELAYED_RELEASE_TABLET | ORAL | Status: DC

## 2014-03-12 NOTE — Telephone Encounter (Signed)
Received prior authorization request for patient's Pantoprazole. Called patient to clarify how he takes this medication. Per patient, "somedays I'll take one, if it's really bad, I'll take two." When asked how often he takes medication once daily, his response was "most days I don't have to take any."  Refills sent into pharmacy #30 take 1 tablet daily as directed.

## 2014-04-06 ENCOUNTER — Encounter (HOSPITAL_COMMUNITY): Payer: Self-pay | Admitting: Emergency Medicine

## 2014-04-06 ENCOUNTER — Emergency Department (HOSPITAL_COMMUNITY)
Admission: EM | Admit: 2014-04-06 | Discharge: 2014-04-06 | Disposition: A | Discharge status: Home / Self Care | Payer: Medicare Other | Attending: Emergency Medicine | Admitting: Emergency Medicine

## 2014-04-06 DIAGNOSIS — Z8739 Personal history of other diseases of the musculoskeletal system and connective tissue: Secondary | ICD-10-CM | POA: Insufficient documentation

## 2014-04-06 DIAGNOSIS — Z8719 Personal history of other diseases of the digestive system: Secondary | ICD-10-CM | POA: Insufficient documentation

## 2014-04-06 DIAGNOSIS — Z859 Personal history of malignant neoplasm, unspecified: Secondary | ICD-10-CM | POA: Insufficient documentation

## 2014-04-06 DIAGNOSIS — N390 Urinary tract infection, site not specified: Secondary | ICD-10-CM

## 2014-04-06 DIAGNOSIS — N189 Chronic kidney disease, unspecified: Secondary | ICD-10-CM | POA: Insufficient documentation

## 2014-04-06 DIAGNOSIS — Z8669 Personal history of other diseases of the nervous system and sense organs: Secondary | ICD-10-CM | POA: Insufficient documentation

## 2014-04-06 DIAGNOSIS — E669 Obesity, unspecified: Secondary | ICD-10-CM | POA: Insufficient documentation

## 2014-04-06 DIAGNOSIS — I2510 Atherosclerotic heart disease of native coronary artery without angina pectoris: Secondary | ICD-10-CM | POA: Insufficient documentation

## 2014-04-06 DIAGNOSIS — I129 Hypertensive chronic kidney disease with stage 1 through stage 4 chronic kidney disease, or unspecified chronic kidney disease: Secondary | ICD-10-CM | POA: Insufficient documentation

## 2014-04-06 DIAGNOSIS — Z87891 Personal history of nicotine dependence: Secondary | ICD-10-CM | POA: Insufficient documentation

## 2014-04-06 LAB — URINE MICROSCOPIC-ADD ON

## 2014-04-06 LAB — CBC WITH DIFFERENTIAL/PLATELET
Basophils Absolute: 0 10*3/uL (ref 0.0–0.1)
Basophils Relative: 0 % (ref 0–1)
Eosinophils Absolute: 0.2 10*3/uL (ref 0.0–0.7)
Eosinophils Relative: 3 % (ref 0–5)
HCT: 42.5 % (ref 39.0–52.0)
Hemoglobin: 14.2 g/dL (ref 13.0–17.0)
LYMPHS ABS: 1.2 10*3/uL (ref 0.7–4.0)
LYMPHS PCT: 16 % (ref 12–46)
MCH: 30.9 pg (ref 26.0–34.0)
MCHC: 33.4 g/dL (ref 30.0–36.0)
MCV: 92.6 fL (ref 78.0–100.0)
Monocytes Absolute: 1 10*3/uL (ref 0.1–1.0)
Monocytes Relative: 14 % — ABNORMAL HIGH (ref 3–12)
NEUTROS ABS: 4.9 10*3/uL (ref 1.7–7.7)
NEUTROS PCT: 67 % (ref 43–77)
PLATELETS: 199 10*3/uL (ref 150–400)
RBC: 4.59 MIL/uL (ref 4.22–5.81)
RDW: 14 % (ref 11.5–15.5)
WBC: 7.2 10*3/uL (ref 4.0–10.5)

## 2014-04-06 LAB — URINALYSIS, ROUTINE W REFLEX MICROSCOPIC
Bilirubin Urine: NEGATIVE
Glucose, UA: NEGATIVE mg/dL
KETONES UR: NEGATIVE mg/dL
NITRITE: POSITIVE — AB
Protein, ur: 100 mg/dL — AB
SPECIFIC GRAVITY, URINE: 1.021 (ref 1.005–1.030)
Urobilinogen, UA: 0.2 mg/dL (ref 0.0–1.0)
pH: 5.5 (ref 5.0–8.0)

## 2014-04-06 LAB — BASIC METABOLIC PANEL
ANION GAP: 12 (ref 5–15)
BUN: 21 mg/dL (ref 6–23)
CALCIUM: 9.1 mg/dL (ref 8.4–10.5)
CO2: 20 meq/L (ref 19–32)
Chloride: 103 mEq/L (ref 96–112)
Creatinine, Ser: 0.95 mg/dL (ref 0.50–1.35)
GFR calc Af Amer: 85 mL/min — ABNORMAL LOW (ref >90)
GFR, EST NON AFRICAN AMERICAN: 73 mL/min — AB (ref >90)
GLUCOSE: 112 mg/dL — AB (ref 70–99)
POTASSIUM: 4.7 meq/L (ref 3.7–5.3)
SODIUM: 135 meq/L — AB (ref 137–147)

## 2014-04-06 LAB — I-STAT CG4 LACTIC ACID, ED: LACTIC ACID, VENOUS: 0.99 mmol/L (ref 0.5–2.2)

## 2014-04-06 MED ORDER — DEXTROSE 5 % IV SOLN
1.0000 g | Freq: Once | INTRAVENOUS | Status: AC
  Administered 2014-04-06: 1 g via INTRAVENOUS
  Filled 2014-04-06: qty 10

## 2014-04-06 MED ORDER — SODIUM CHLORIDE 0.9 % IV BOLUS (SEPSIS)
500.0000 mL | Freq: Once | INTRAVENOUS | Status: AC
  Administered 2014-04-06: 500 mL via INTRAVENOUS

## 2014-04-06 MED ORDER — CEPHALEXIN 500 MG PO CAPS: 500.0000 mg | ORAL_CAPSULE | Freq: Two times a day (BID) | ORAL | Status: DC

## 2014-04-06 NOTE — ED Notes (Addendum)
Pt reports dysuria and frequency since Wednesday unresolved by PO Cipro prescribed Wednesday.   Pt encouraged to void when able.

## 2014-04-06 NOTE — ED Provider Notes (Signed)
TIME SEEN: 8:45 AM  CHIEF COMPLAINT: Dysuria, urinary frequency   HPI: Patient is an 80 year old male with history of CAD, BPH, hypertension, prior pulmonary embolus, renal carcinoma, chronic kidney disease, myasthenia gravis, frequent UTIs who presents emergency department with complaints of dysuria and urinary frequency. He states he has had this problem for several years but it has worsened since Wednesday, 4 days ago. He states he is also had urinary urgency and frequency. No hematuria. No fevers, vomiting or diarrhea. No complaints of pain otherwise. He states his myasthenia gravis has been doing well. No ptosis, double vision, difficulty speaking or swallowing or breathing.  ROS: See HPI Constitutional: no fever  Eyes: no drainage  ENT: no runny nose   Cardiovascular:  no chest pain  Resp: no SOB  GI: no vomiting GU: no dysuria Integumentary: no rash  Allergy: no hives  Musculoskeletal: no leg swelling  Neurological: no slurred speech ROS otherwise negative  PAST MEDICAL HISTORY/PAST SURGICAL HISTORY:  Past Medical History  Diagnosis Date  . Coronary artery disease   . Diverticulitis   . GERD (gastroesophageal reflux disease)   . Benign prostatic hypertrophy   . Partial small bowel obstruction 04/16/10    resolved with bowel rest  . Hypertension     Dr. Rollene Fare- within a month or 2 to clear for surgery   . Shortness of breath   . Pulmonary embolus     2000  . Arthritis     back, hips, knees   . Cancer     h/o renal carcinoma   . Chronic kidney disease     h/o UTI-2012  . Obesity   . Myasthenia gravis     Dr. Jannifer Franklin  . Osteoporosis     MEDICATIONS:  Prior to Admission medications   Medication Sig Start Date End Date Taking? Authorizing Provider  aspirin 81 MG tablet Take 81 mg by mouth daily.    Historical Provider, MD  cefixime (SUPRAX) 400 MG tablet Take 1 tablet (400 mg total) by mouth daily. 02/21/14   Liam Graham, PA-C  ciprofloxacin (CIPRO) 500 MG  tablet Take 500 mg by mouth 2 (two) times daily. 10 day supply with refills    Historical Provider, MD  clopidogrel (PLAVIX) 75 MG tablet Take 75 mg by mouth daily.    Historical Provider, MD  fluticasone (FLONASE) 50 MCG/ACT nasal spray Place 2 sprays into both nostrils daily. 09/03/13   [name], MD  HYDROcodone-acetaminophen (NORCO/VICODIN) 5-325 MG per tablet Take 1 tablet by mouth every 6 (six) hours as needed for moderate pain. 02/01/14   [name], MD  ketoconazole (NIZORAL) 2 % cream Apply 1 application topically daily. 02/23/14   Fransico Meadow, PA-C  metoprolol succinate (TOPROL-XL) 50 MG 24 hr tablet Take 50 mg by mouth daily after breakfast. Take with or immediately following a meal.    Historical Provider, MD  Multiple Vitamin (MULTIVITAMIN WITH MINERALS) TABS Take 1 tablet by mouth daily.    Historical Provider, MD  mycophenolate (CELLCEPT) 500 MG tablet TAKE 1 TABLET EVERY MORNING AND 2 TABLETS AT BEDTIME.    Kathrynn Ducking, MD  nystatin-triamcinolone [facility] II) cream Apply to affected area 3 times daily 02/21/14   Liam Graham, PA-C  omega-3 acid ethyl esters (LOVAZA) 1 G capsule Take 2 g by mouth daily.    Historical Provider, MD  pantoprazole (PROTONIX) 40 MG tablet Take 1 tablet (40 mg total) by mouth once daily or as directed. 03/12/14  Troy Sine, MD  polyethylene glycol [facility] / GLYCOLAX) packet Take 17 g by mouth daily as needed. For constipation    Historical Provider, MD  predniSONE (DELTASONE) 1 MG tablet Begin 2 tablets every other day for 3 months, then take one tablet every other day for 3 months, then stop 12/27/13   Kathrynn Ducking, MD  predniSONE (DELTASONE) 2.5 MG tablet Take 7.5 mg by mouth every other day.    Historical Provider, MD  pyridostigmine (MESTINON) 60 MG tablet TAKE  (1)  TABLET  FOUR TIMES DAILY. 01/31/14   Kathrynn Ducking, MD  rosuvastatin (CRESTOR) 10 MG tablet Take 1 tablet (10 mg total) by mouth daily after breakfast. 11/12/13   Troy Sine, MD  sulfamethoxazole-trimethoprim (SEPTRA DS) 800-160 MG per tablet Take 1 tablet by mouth 2 (two) times daily. 01/15/14   Brent General, PA-C    ALLERGIES:  Allergies  Allergen Reactions  . Dicyclomine Other (See Comments)    REACTION: can worsen myasthenia gravis    SOCIAL HISTORY:  History  Substance Use Topics  . Smoking status: Former Smoker    Types: Cigarettes    Quit date: 03/22/1959  . Smokeless tobacco: Never Used     Comment: quit 1970s  . Alcohol Use: No    FAMILY HISTORY: Family History  Problem Relation Age of Onset  . Colon cancer Mother     EXAM: BP 130/73  Pulse 93  Temp(Src) 98.3 F (36.8 C) (Oral)  Resp 18  SpO2 95% CONSTITUTIONAL: Alert and oriented and responds appropriately to questions. Well-appearing; well-nourished, elderly, very pleasant, joking HEAD: Normocephalic EYES: Conjunctivae clear, PERRL ENT: normal nose; no rhinorrhea; moist mucous membranes; pharynx without lesions noted NECK: Supple, no meningismus, no LAD  CARD: RRR; S1 and S2 appreciated; no murmurs, no clicks, no rubs, no gallops RESP: Normal chest excursion without splinting or tachypnea; breath sounds clear and equal bilaterally; no wheezes, no rhonchi, no rales,  ABD/GI: Normal bowel sounds; non-distended; soft, non-tender, no rebound, no guarding GU:  Normal external genitalia, no penile discharge, testicles are descended and nontender to palpation without masses, no scrotal masses, 2+ femoral pulses bilaterally, no perineal erythema, warmth, induration, fluctuance or crepitus BACK:  The back appears normal and is non-tender to palpation, there is no CVA tenderness EXT: Normal ROM in all joints; non-tender to palpation; no edema; normal capillary refill; no cyanosis    SKIN: Normal color for age and race; warm NEURO: Moves all extremities equally, sensation to light touch intact diffusely, cranial nerves II through XII intact, normal gait PSYCH: The patient's  mood and manner are appropriate. Grooming and personal hygiene are appropriate.  MEDICAL DECISION MAKING: Patient here with symptoms of UTI. He states he has frequent UTIs and was prescribed Cipro by Dr. Rosana Hoes with urology 4 days ago. His last 2 urine cultures have grown Escherichia coli that was resistant to Cipro. Suspect that this is the reason he is not improving. He has been sensitive to cephalosporins in the past. Will check labs, lactate, give IV fluids and ceftriaxone. We will obtain urinalysis and urine culture. Anticipate discharge home as patient is doing well, feeling well, pleasant, nontoxic and well-hydrated.  ED PROGRESS: Patient's labs are unremarkable, no leukocytosis, normal lactate. He is still very well-appearing and in no distress. I feel he is safe to be discharged home on oral antibiotics. Patient agrees with this plan. We'll discharge home with prescription for Keflex. Discussed strict return precautions and supportive care instructions.  Patient verbalizes understanding and is comfortable with this plan.     [place], DO 04/06/14 [phone]

## 2014-04-06 NOTE — Discharge Instructions (Signed)

## 2014-04-06 NOTE — ED Notes (Addendum)
Pt reports burning with urination that has not improved with PO cipro that be began taking Wednesday evening. Pt states pain is getting worse with frequency. Hx of recurrent UTIs. Pt states he is also coming off prednisone

## 2014-04-07 ENCOUNTER — Emergency Department (HOSPITAL_COMMUNITY)
Admission: EM | Admit: 2014-04-07 | Discharge: 2014-04-07 | Disposition: A | Discharge status: Home / Self Care | Payer: Medicare Other | Attending: Emergency Medicine | Admitting: Emergency Medicine

## 2014-04-07 ENCOUNTER — Encounter (HOSPITAL_COMMUNITY): Payer: Self-pay | Admitting: Emergency Medicine

## 2014-04-07 DIAGNOSIS — Z85528 Personal history of other malignant neoplasm of kidney: Secondary | ICD-10-CM | POA: Insufficient documentation

## 2014-04-07 DIAGNOSIS — I10 Essential (primary) hypertension: Secondary | ICD-10-CM | POA: Insufficient documentation

## 2014-04-07 DIAGNOSIS — Z888 Allergy status to other drugs, medicaments and biological substances status: Secondary | ICD-10-CM | POA: Insufficient documentation

## 2014-04-07 DIAGNOSIS — M129 Arthropathy, unspecified: Secondary | ICD-10-CM | POA: Insufficient documentation

## 2014-04-07 DIAGNOSIS — E669 Obesity, unspecified: Secondary | ICD-10-CM | POA: Insufficient documentation

## 2014-04-07 DIAGNOSIS — M810 Age-related osteoporosis without current pathological fracture: Secondary | ICD-10-CM | POA: Insufficient documentation

## 2014-04-07 DIAGNOSIS — Z79899 Other long term (current) drug therapy: Secondary | ICD-10-CM | POA: Insufficient documentation

## 2014-04-07 DIAGNOSIS — N3000 Acute cystitis without hematuria: Secondary | ICD-10-CM | POA: Insufficient documentation

## 2014-04-07 DIAGNOSIS — K219 Gastro-esophageal reflux disease without esophagitis: Secondary | ICD-10-CM | POA: Insufficient documentation

## 2014-04-07 DIAGNOSIS — Z86711 Personal history of pulmonary embolism: Secondary | ICD-10-CM | POA: Insufficient documentation

## 2014-04-07 DIAGNOSIS — G7000 Myasthenia gravis without (acute) exacerbation: Secondary | ICD-10-CM | POA: Insufficient documentation

## 2014-04-07 DIAGNOSIS — Z87891 Personal history of nicotine dependence: Secondary | ICD-10-CM | POA: Insufficient documentation

## 2014-04-07 DIAGNOSIS — K56609 Unspecified intestinal obstruction, unspecified as to partial versus complete obstruction: Secondary | ICD-10-CM | POA: Insufficient documentation

## 2014-04-07 DIAGNOSIS — K5289 Other specified noninfective gastroenteritis and colitis: Secondary | ICD-10-CM | POA: Insufficient documentation

## 2014-04-07 DIAGNOSIS — N400 Enlarged prostate without lower urinary tract symptoms: Secondary | ICD-10-CM | POA: Insufficient documentation

## 2014-04-07 DIAGNOSIS — Z7902 Long term (current) use of antithrombotics/antiplatelets: Secondary | ICD-10-CM | POA: Insufficient documentation

## 2014-04-07 DIAGNOSIS — K5732 Diverticulitis of large intestine without perforation or abscess without bleeding: Secondary | ICD-10-CM | POA: Insufficient documentation

## 2014-04-07 DIAGNOSIS — I2510 Atherosclerotic heart disease of native coronary artery without angina pectoris: Secondary | ICD-10-CM | POA: Insufficient documentation

## 2014-04-07 DIAGNOSIS — IMO0002 Reserved for concepts with insufficient information to code with codable children: Secondary | ICD-10-CM | POA: Insufficient documentation

## 2014-04-07 LAB — BASIC METABOLIC PANEL
Anion gap: 13 (ref 5–15)
BUN: 21 mg/dL (ref 6–23)
CHLORIDE: 102 meq/L (ref 96–112)
CO2: 22 mEq/L (ref 19–32)
CREATININE: 1.02 mg/dL (ref 0.50–1.35)
Calcium: 9.2 mg/dL (ref 8.4–10.5)
GFR calc Af Amer: 75 mL/min — ABNORMAL LOW (ref >90)
GFR calc non Af Amer: 64 mL/min — ABNORMAL LOW (ref >90)
GLUCOSE: 108 mg/dL — AB (ref 70–99)
POTASSIUM: 4.1 meq/L (ref 3.7–5.3)
Sodium: 137 mEq/L (ref 137–147)

## 2014-04-07 LAB — URINALYSIS, ROUTINE W REFLEX MICROSCOPIC
Bilirubin Urine: NEGATIVE
GLUCOSE, UA: NEGATIVE mg/dL
KETONES UR: NEGATIVE mg/dL
Nitrite: NEGATIVE
PROTEIN: 30 mg/dL — AB
Specific Gravity, Urine: 1.019 (ref 1.005–1.030)
Urobilinogen, UA: 0.2 mg/dL (ref 0.0–1.0)
pH: 5.5 (ref 5.0–8.0)

## 2014-04-07 LAB — CBC
HEMATOCRIT: 41 % (ref 39.0–52.0)
HEMOGLOBIN: 13.5 g/dL (ref 13.0–17.0)
MCH: 30.5 pg (ref 26.0–34.0)
MCHC: 32.9 g/dL (ref 30.0–36.0)
MCV: 92.6 fL (ref 78.0–100.0)
Platelets: 206 10*3/uL (ref 150–400)
RBC: 4.43 MIL/uL (ref 4.22–5.81)
RDW: 13.7 % (ref 11.5–15.5)
WBC: 4.6 10*3/uL (ref 4.0–10.5)

## 2014-04-07 LAB — URINE MICROSCOPIC-ADD ON

## 2014-04-07 MED ORDER — DEXTROSE 5 % IV SOLN
1.0000 g | Freq: Once | INTRAVENOUS | Status: AC
  Administered 2014-04-07: 1 g via INTRAVENOUS
  Filled 2014-04-07: qty 10

## 2014-04-07 MED ORDER — POLYETHYLENE GLYCOL 3350 17 G PO PACK: 17.0000 g | PACK | Freq: Every day | ORAL | Status: DC

## 2014-04-07 NOTE — Discharge Instructions (Signed)
Return to the ED with any concerns including vomiting and not able to keep down liquids or antibiotics, fever/chills, decreased level of alertness/lethargy, or any other alarming symptoms °

## 2014-04-07 NOTE — ED Provider Notes (Signed)
CSN: [id number]     Arrival date & time 04/07/14  1501 History   First MD Initiated Contact with Patient 04/07/14 1526     Chief Complaint  Patient presents with  . Urinary Frequency     (Consider location/radiation/quality/duration/timing/severity/associated sxs/prior Treatment) HPI Pt with hx of MMP presents with c/o continued dysuria and urinary frequency.  Pt was seen in the ED yesterday for continued UTI sympotms- his urine culture from 5/21 was resistant to cipro which is what urology had placed him on. Yesterday he was started on ceftriaxone and given rx for keflex.  Pt states he has dysuria and increased frequency of urination which continues despite taking the keflex.  No fever, no vomiting.  He also c/o feeling constipated- his last bowel movement was yesterday.   Past Medical History  Diagnosis Date  . Coronary artery disease   . Diverticulitis   . GERD (gastroesophageal reflux disease)   . Benign prostatic hypertrophy   . Partial small bowel obstruction 04/16/10    resolved with bowel rest  . Hypertension     Dr. Rollene Fare- within a month or 2 to clear for surgery   . Shortness of breath   . Pulmonary embolus     2000  . Arthritis     back, hips, knees   . Cancer     h/o renal carcinoma   . Chronic kidney disease     h/o UTI-2012  . Obesity   . Myasthenia gravis     Dr. Jannifer Franklin  . Osteoporosis    Past Surgical History  Procedure Laterality Date  . Total knee arthroplasty      both Sees Dr. Novella Olive  . Nephrectomy      left  . Angioplasty      had 2 stent replacements  . Stented placed  1/08  . Colonoscopy  05/28/10    per Dr. Laurence Spates, diverticulosis, no repeats planned   . Sp kyphoplasty      and vertebroplasty  . Cardiac catheterization      2008, stents   . Eye surgery      cataracts(bilateral)  removed, ?iol  . Tonsillectomy    . Joint replacement      2005-R, L knee replacement- 2000  . Total knee revision  03/28/2012    Procedure: TOTAL KNEE  REVISION;  Surgeon: Hessie Dibble, MD;  Location: [place];  Service: Orthopedics;  Laterality: Right;   Family History  Problem Relation Age of Onset  . Colon cancer Mother    History  Substance Use Topics  . Smoking status: Former Smoker    Types: Cigarettes    Quit date: 03/22/1959  . Smokeless tobacco: Never Used     Comment: quit 1970s  . Alcohol Use: No    Review of Systems ROS reviewed and all otherwise negative except for mentioned in HPI    Allergies  Dicyclomine  Home Medications   Prior to Admission medications   Medication Sig Start Date End Date Taking? Authorizing Provider  cephALEXin (KEFLEX) 500 MG capsule Take 1 capsule (500 mg total) by mouth 2 (two) times daily. 04/06/14  Yes Kristen N Ward, DO  ciprofloxacin (CIPRO) 500 MG tablet Take 500 mg by mouth 2 (two) times daily. 04/03/14  Yes Historical Provider, MD  clopidogrel (PLAVIX) 75 MG tablet Take 75 mg by mouth daily.   Yes Historical Provider, MD  fluticasone (FLONASE) 50 MCG/ACT nasal spray Place 2 sprays into both nostrils daily. 09/03/13  Yes Annie Main  Raymon Mutton, MD  HYDROcodone-acetaminophen (NORCO/VICODIN) 5-325 MG per tablet Take 1 tablet by mouth every 6 (six) hours as needed for moderate pain. 02/01/14  Yes [name], MD  ketoconazole (NIZORAL) 2 % cream Apply 1 application topically daily. 02/23/14  Yes Hollace Kinnier Sofia, PA-C  metoprolol succinate (TOPROL-XL) 50 MG 24 hr tablet Take 50 mg by mouth daily after breakfast. Take with or immediately following a meal.   Yes Historical Provider, MD  Multiple Vitamin (MULTIVITAMIN WITH MINERALS) TABS Take 1 tablet by mouth daily.   Yes Historical Provider, MD  mycophenolate (CELLCEPT) 500 MG tablet Take 500-1,000 mg by mouth 2 (two) times daily. Takes 500mg  in the morning and 1000mg  at bedtime   Yes Historical Provider, MD  nystatin-triamcinolone (MYCOLOG II) cream Apply to affected area 3 times daily 02/21/14  Yes Liam Graham, PA-C  pantoprazole (PROTONIX) 40 MG  tablet Take 1 tablet (40 mg total) by mouth once daily or as directed. 03/12/14  Yes Troy Sine, MD  polyethylene glycol [facility] / GLYCOLAX) packet Take 17 g by mouth daily as needed. For constipation   Yes Historical Provider, MD  predniSONE (DELTASONE) 1 MG tablet Take 2 mg by mouth every other day.   Yes Historical Provider, MD  pyridostigmine (MESTINON) 60 MG tablet Take 60 mg by mouth 4 (four) times daily.   Yes Historical Provider, MD  rosuvastatin (CRESTOR) 10 MG tablet Take 1 tablet (10 mg total) by mouth daily after breakfast. 11/12/13  Yes Troy Sine, MD  polyethylene glycol [facility]) packet Take 17 g by mouth daily. 04/07/14   Threasa Beards, MD   BP 124/65  Pulse 78  Temp(Src) 98.4 F (36.9 C) (Oral)  Resp 18  SpO2 92% Vitals reviewed Physical Exam Physical Examination: General appearance - alert, chronically ill appearing, and in no distress Mental status - alert, oriented to person, place, and time Eyes - no conjunctival injection, no scleral icterus Mouth - mucous membranes moist, pharynx normal without lesions Chest - clear to auscultation, no wheezes, rales or rhonchi, symmetric air entry Heart - normal rate, regular rhythm, normal S1, S2, no murmurs, rubs, clicks or gallops Abdomen - soft, nontender, nondistended, no masses or organomegaly, nabs Extremities - peripheral pulses normal, no pedal edema, no clubbing or cyanosis Skin - normal coloration and turgor, no rashes  ED Course  Procedures (including critical care time) Labs Review Labs Reviewed  URINALYSIS, ROUTINE W REFLEX MICROSCOPIC - Abnormal; Notable for the following:    APPearance CLOUDY (*)    Hgb urine dipstick TRACE (*)    Protein, ur 30 (*)    Leukocytes, UA LARGE (*)    All other components within normal limits  BASIC METABOLIC PANEL - Abnormal; Notable for the following:    Glucose, Bld 108 (*)    GFR calc non Af Amer 64 (*)    GFR calc Af Amer 75 (*)    All other components within normal  limits  CBC  URINE MICROSCOPIC-ADD ON    Imaging Review No results found.   EKG Interpretation None      MDM   Final diagnoses:  Acute cystitis without hematuria    Pt presenting with continued symptoms of dysuria- given rocephin yesterday in ED and started on keflex, urine shows some improvement today-negative for nitrite- culture shows Ecoli but sensitivities are not resulted yet from most recent culture.  Pt has no fever, no leukocytosis, no signs of sepsis or systemic infection.  Creatnine reassuring  as well.  Pt given another dose of rocephin in the ED.  Will also start on miralax for constipation- pt did pass a BM yesterday.  No abdominal tenderness on exam today.  Pt advised close follow up with PMD.  Discharged with strict return precautions.  Pt agreeable with plan.  Prior records reviewed and considered during this visit Nursing notes including past medical history and social history reviewed and considered in documentation     Threasa Beards, MD 04/07/14 [phone]

## 2014-04-07 NOTE — ED Notes (Addendum)
Pt reports having urinary frequency and dysuria. He also reports having issues with having bowel movements. Last BM yesterday. Good bowel sounds. He reports taking his ABX that was given yesterday.

## 2014-04-07 NOTE — ED Notes (Signed)
Pt was taken to front of department and assisted into vehicle. Friend driving pt home.

## 2014-04-07 NOTE — ED Notes (Addendum)
Pt c/o urinary frequency x 15 mins, states he only has one kidney. Also c/o his bowels not working properly. Was seen yesterday for same thing diagnosed with UTI.

## 2014-04-07 NOTE — ED Notes (Signed)
MD at bedside. 

## 2014-04-07 NOTE — ED Notes (Addendum)
Pt upset about being here and "laying in pain" when asking patient about his pain score patient states he is not in pain right now. Pt reports being cold, warm blanket provided as well as socks per pt request. Pt frustrated.

## 2014-04-08 LAB — URINE CULTURE: Colony Count: >=100000

## 2014-04-12 ENCOUNTER — Telehealth (HOSPITAL_BASED_OUTPATIENT_CLINIC_OR_DEPARTMENT_OTHER): Payer: Self-pay | Admitting: Emergency Medicine

## 2014-04-12 NOTE — Telephone Encounter (Signed)
Post ED Visit - Positive Culture Follow-up  Culture report reviewed by antimicrobial stewardship pharmacist: []  Wes Dulaney, Pharm.D., BCPS [x]  Heide Guile, Pharm.D., BCPS []  Alycia Rossetti, Pharm.D., BCPS []  [place], Pharm.D., BCPS, AAHIVP []  Legrand Como, Pharm.D., BCPS, AAHIVP   Positive urine culture Treated with Keflex, organism sensitive to the same and no further patient follow-up is required at this time.  Prentice, Rex Kras 04/12/2014, 2:35 PM

## 2014-04-24 ENCOUNTER — Other Ambulatory Visit: Payer: Self-pay | Admitting: *Deleted

## 2014-04-24 MED ORDER — CLOPIDOGREL BISULFATE 75 MG PO TABS: 75.0000 mg | ORAL_TABLET | Freq: Every day | ORAL | Status: DC

## 2014-04-24 NOTE — Telephone Encounter (Signed)
Rx refill sent to patient pharmacy   

## 2014-06-07 ENCOUNTER — Ambulatory Visit (INDEPENDENT_AMBULATORY_CARE_PROVIDER_SITE_OTHER): Payer: Medicare Other | Admitting: Family Medicine

## 2014-06-07 ENCOUNTER — Encounter: Payer: Self-pay | Admitting: Family Medicine

## 2014-06-07 VITALS — BP 130/81 | HR 78 | Temp 97.6°F | Wt 255.0 lb

## 2014-06-07 DIAGNOSIS — I2510 Atherosclerotic heart disease of native coronary artery without angina pectoris: Secondary | ICD-10-CM

## 2014-06-07 DIAGNOSIS — I10 Essential (primary) hypertension: Secondary | ICD-10-CM

## 2014-06-07 DIAGNOSIS — Z23 Encounter for immunization: Secondary | ICD-10-CM

## 2014-06-07 DIAGNOSIS — M1990 Unspecified osteoarthritis, unspecified site: Secondary | ICD-10-CM

## 2014-06-07 MED ORDER — HYDROCODONE-ACETAMINOPHEN 5-325 MG PO TABS: 1.0000 | ORAL_TABLET | Freq: Four times a day (QID) | ORAL | Status: DC | PRN

## 2014-06-07 NOTE — Progress Notes (Signed)
   Subjective:    Patient ID: [name], male    DOB: 1925-02-16, 80 y.o.   MRN: [id number]  HPI Here for follow up and refills. He feels quite well in general. He sees Dr. Claiborne Billings and Dr. Jannifer Franklin regularly.    Review of Systems  Constitutional: Negative.   Respiratory: Negative.   Cardiovascular: Negative.   Musculoskeletal: Positive for arthralgias.       Objective:   Physical Exam  Constitutional: He appears well-developed and well-nourished.  Neck: No thyromegaly present.  Cardiovascular: Normal rate, regular rhythm, normal heart sounds and intact distal pulses.   Pulmonary/Chest: Effort normal and breath sounds normal.  Lymphadenopathy:    He has no cervical adenopathy.          Assessment & Plan:  He is doing well. Given vaccines for flu and pneumonia. Refilled pain meds.

## 2014-07-01 ENCOUNTER — Ambulatory Visit: Payer: Medicare Other | Admitting: Neurology

## 2014-08-12 ENCOUNTER — Other Ambulatory Visit: Payer: Self-pay | Admitting: Cardiovascular Disease

## 2014-08-21 ENCOUNTER — Encounter: Payer: Self-pay | Admitting: Neurology

## 2014-08-23 ENCOUNTER — Encounter: Payer: Self-pay | Admitting: Neurology

## 2014-08-23 ENCOUNTER — Ambulatory Visit (INDEPENDENT_AMBULATORY_CARE_PROVIDER_SITE_OTHER): Payer: Medicare Other | Admitting: Neurology

## 2014-08-23 VITALS — BP 134/77 | HR 70 | Ht 67.5 in | Wt 256.0 lb

## 2014-08-23 DIAGNOSIS — Z5181 Encounter for therapeutic drug level monitoring: Secondary | ICD-10-CM

## 2014-08-23 DIAGNOSIS — G7000 Myasthenia gravis without (acute) exacerbation: Secondary | ICD-10-CM

## 2014-08-23 DIAGNOSIS — I2510 Atherosclerotic heart disease of native coronary artery without angina pectoris: Secondary | ICD-10-CM

## 2014-08-23 MED ORDER — MYCOPHENOLATE MOFETIL 500 MG PO TABS: ORAL_TABLET | ORAL | Status: DC

## 2014-08-23 NOTE — Progress Notes (Signed)
Reason for visit: Myasthenia gravis  [name] is an 80 y.o. male  History of present illness:  [name] is an 80 year old right-handed white male with a history of myasthenia gravis. The patient is doing quite well since last seen. He reports no significant changes in his underlying clinical status. He has not had any significant problems with double vision, ptosis, difficulty chewing or swallowing. He reports good strength in arms and legs. He is currently off of prednisone, and he remains on CellCept. The patient has not had any recent blood work done. He comes to this office for an evaluation.  Past Medical History  Diagnosis Date  . Coronary artery disease     sees Dr. Shelva Majestic   . Diverticulitis   . GERD (gastroesophageal reflux disease)   . Benign prostatic hypertrophy   . Partial small bowel obstruction 04/16/10    resolved with bowel rest  . Hypertension   . Shortness of breath   . Pulmonary embolus     2000  . Arthritis     back, hips, knees   . Cancer     h/o renal carcinoma   . Chronic kidney disease     h/o UTI-2012  . Obesity   . Myasthenia gravis     sees Dr. Jannifer Franklin  . Osteoporosis     Past Surgical History  Procedure Laterality Date  . Total knee arthroplasty      both Sees Dr. Novella Olive  . Nephrectomy      left  . Angioplasty      had 2 stent replacements  . Stented placed  1/08  . Colonoscopy  05/28/10    per Dr. Laurence Spates, diverticulosis, no repeats planned   . Sp kyphoplasty      and vertebroplasty  . Cardiac catheterization      2008, stents   . Eye surgery      cataracts(bilateral)  removed, ?iol  . Tonsillectomy    . Joint replacement      2005-R, L knee replacement- 2000  . Total knee revision  03/28/2012    Procedure: TOTAL KNEE REVISION;  Surgeon: Hessie Dibble, MD;  Location: [facility];  Service: Orthopedics;  Laterality: Right;    Family History  Problem Relation Age of Onset  . Colon cancer Mother     Social  history:  reports that he quit smoking about 55 years ago. His smoking use included Cigarettes. He smoked 0.00 packs per day. He has never used smokeless tobacco. He reports that he does not drink alcohol or use illicit drugs.    Allergies  Allergen Reactions  . Dicyclomine Other (See Comments)    REACTION: can worsen myasthenia gravis    Medications:  Current Outpatient Prescriptions on File Prior to Visit  Medication Sig Dispense Refill  . clopidogrel (PLAVIX) 75 MG tablet Take 1 tablet (75 mg total) by mouth daily. 30 tablet 5  . fluticasone (FLONASE) 50 MCG/ACT nasal spray Place 2 sprays into both nostrils daily.    Marland Kitchen HYDROcodone-acetaminophen (NORCO/VICODIN) 5-325 MG per tablet Take 1 tablet by mouth every 6 (six) hours as needed for moderate pain. 120 tablet 0  . ketoconazole (NIZORAL) 2 % cream Apply 1 application topically daily. 15 g 0  . metoprolol succinate (TOPROL-XL) 50 MG 24 hr tablet TAKE 1 TABLET ONCE DAILY. 30 tablet 1  . Multiple Vitamin (MULTIVITAMIN WITH MINERALS) TABS Take 1 tablet by mouth daily.    Marland Kitchen nystatin-triamcinolone (MYCOLOG II) cream  Apply to affected area 3 times daily 30 g 1  . pantoprazole (PROTONIX) 40 MG tablet Take 1 tablet (40 mg total) by mouth once daily or as directed. 30 tablet 4  . pyridostigmine (MESTINON) 60 MG tablet Take 60 mg by mouth 4 (four) times daily.    . rosuvastatin (CRESTOR) 10 MG tablet Take 1 tablet (10 mg total) by mouth daily after breakfast. 30 tablet 8   No current facility-administered medications on file prior to visit.    ROS:  Out of a complete 14 system review of symptoms, the patient complains only of the following symptoms, and all other reviewed systems are negative.  Runny nose  Blood pressure 134/77, pulse 70, height 5' 7.5" (1.715 m), weight 256 lb (116.121 kg).  Physical Exam  General: The patient is alert and cooperative at the time of the examination. The patient is markedly obese.  Skin: No significant  peripheral edema is noted.   Neurologic Exam  Mental status: The patient is oriented x 3.  Cranial nerves: Facial symmetry is present. Speech is normal, no aphasia or dysarthria is noted. Extraocular movements are full. Visual fields are full.  Motor: The patient has good strength in all 4 extremities.  Sensory examination: Soft touch sensation on the face, arms, and legs is symmetric.  Coordination: The patient has good finger-nose-finger and heel-to-shin bilaterally.  Gait and station: The patient has a normal gait. Tandem gait was not tested. Romberg is negative. No drift is seen.  Reflexes: Deep tendon reflexes are symmetric.   Assessment/Plan:  1. Myasthenia gravis  The patient is doing well at this time, he is currently off of the prednisone and remains stable clinically. The patient will continue the CellCept. We will check blood work today. He will follow-up in 6 months. We will check blood work again in 3 months.  Jill Alexanders MD 08/24/2014 7:35 AM  [facility] [address] [place], [place] [address]  Phone [phone] Fax [phone]

## 2014-08-23 NOTE — Patient Instructions (Signed)
Myasthenia Gravis Myasthenia gravis is a disease that causes muscle weakness throughout the body. The muscles affected are the ones we can control (voluntary muscles). An example of a voluntary muscle is your hand muscles. You can control the muscles to make the hand pick something up. An example of an involuntary muscle is the heart. The heart beats without any direction from you.  Myasthenia Gravis is thought to be an autoimmune disease. That means that normal defenses of the body begin to attack the body. In this case, the immune system begins to attack cells located at the junctions of the muscles and the nerves. Women are affected more often. Women are affected at a younger age than men. Babies born to affected women frequently develop symptoms at an early age. SYMPTOMS Initially in the disease, the facial muscles are affected first. After this, a person may develop droopy eyelids. They may have difficulty controlling facial muscles. They may have problems chewing. Swallowing and speaking may become impaired. The weakness gradually spreads to the arms and legs. It begins to affect breathing. Sometimes, the symptoms lessen or go away without any apparent cause. DIAGNOSIS  Diagnosis can be made with blood tests. Tests such as electromyography may be done to examine the electrical activity in the muscle. An improvement in symptoms after having an anti-cholinesterase drug helps confirm the diagnosis.  TREATMENT  Medicines are usually prescribed as the first treatment. These medicines help, but they do not cure the disease. A plasma cleansing procedure (plasmapheresis) can be used to treat a crisis. It can also be used to prepare a person for surgery. This procedure produces short-term improvement. Some cases are helped by removing the thymus gland. Steroids are used for short-term benefits. Document Released: 12/27/2000 Document Revised: 12/13/2011 Document Reviewed: 11/21/2013 [employer] Patient  Information 2015 [employer]. This information is not intended to replace advice given to you by your health care provider. Make sure you discuss any questions you have with your health care provider.  

## 2014-08-24 ENCOUNTER — Telehealth: Payer: Self-pay | Admitting: Neurology

## 2014-08-24 LAB — CBC WITH DIFFERENTIAL
BASOS ABS: 0 10*3/uL (ref 0.0–0.2)
Basos: 0 %
EOS ABS: 0.2 10*3/uL (ref 0.0–0.4)
Eos: 6 %
HEMATOCRIT: 40.8 % (ref 37.5–51.0)
Hemoglobin: 14 g/dL (ref 12.6–17.7)
Immature Grans (Abs): 0 10*3/uL (ref 0.0–0.1)
Immature Granulocytes: 0 %
LYMPHS ABS: 1.4 10*3/uL (ref 0.7–3.1)
Lymphs: 43 %
MCH: 31.7 pg (ref 26.6–33.0)
MCHC: 34.3 g/dL (ref 31.5–35.7)
MCV: 92 fL (ref 79–97)
MONOCYTES: 15 %
MONOS ABS: 0.5 10*3/uL (ref 0.1–0.9)
Neutrophils Absolute: 1.2 10*3/uL — ABNORMAL LOW (ref 1.4–7.0)
Neutrophils Relative %: 36 %
Platelets: 193 10*3/uL (ref 150–379)
RBC: 4.42 x10E6/uL (ref 4.14–5.80)
RDW: 14.2 % (ref 12.3–15.4)
WBC: 3.3 10*3/uL — ABNORMAL LOW (ref 3.4–10.8)

## 2014-08-24 LAB — COMPREHENSIVE METABOLIC PANEL
A/G RATIO: 1.4 (ref 1.1–2.5)
ALT: 22 IU/L (ref 0–44)
AST: 24 IU/L (ref 0–40)
Albumin: 3.9 g/dL (ref 3.5–4.7)
Alkaline Phosphatase: 60 IU/L (ref 39–117)
BUN / CREAT RATIO: 18 (ref 10–22)
BUN: 18 mg/dL (ref 8–27)
CO2: 24 mmol/L (ref 18–29)
Calcium: 9.1 mg/dL (ref 8.6–10.2)
Chloride: 107 mmol/L (ref 97–108)
Creatinine, Ser: 1.01 mg/dL (ref 0.76–1.27)
GFR calc Af Amer: 77 mL/min/{1.73_m2} (ref >59)
GFR, EST NON AFRICAN AMERICAN: 67 mL/min/{1.73_m2} (ref >59)
GLOBULIN, TOTAL: 2.8 g/dL (ref 1.5–4.5)
GLUCOSE: 93 mg/dL (ref 65–99)
Potassium: 4.3 mmol/L (ref 3.5–5.2)
Sodium: 143 mmol/L (ref 134–144)
TOTAL PROTEIN: 6.7 g/dL (ref 6.0–8.5)
Total Bilirubin: 0.6 mg/dL (ref 0.0–1.2)

## 2014-08-24 NOTE — Telephone Encounter (Signed)
I called the patient. The blood work is OK, but the WBC is minimally low. I will recheck in about 4 weeks.

## 2014-08-27 ENCOUNTER — Encounter: Payer: Self-pay | Admitting: Neurology

## 2014-09-04 ENCOUNTER — Ambulatory Visit (INDEPENDENT_AMBULATORY_CARE_PROVIDER_SITE_OTHER): Payer: Medicare Other | Admitting: Cardiovascular Disease

## 2014-09-04 ENCOUNTER — Encounter: Payer: Self-pay | Admitting: Cardiovascular Disease

## 2014-09-04 VITALS — BP 130/80 | HR 85 | Ht 69.0 in | Wt 256.3 lb

## 2014-09-04 DIAGNOSIS — I10 Essential (primary) hypertension: Secondary | ICD-10-CM

## 2014-09-04 DIAGNOSIS — G7000 Myasthenia gravis without (acute) exacerbation: Secondary | ICD-10-CM

## 2014-09-04 DIAGNOSIS — E785 Hyperlipidemia, unspecified: Secondary | ICD-10-CM

## 2014-09-04 DIAGNOSIS — Z905 Acquired absence of kidney: Secondary | ICD-10-CM

## 2014-09-04 DIAGNOSIS — Z79899 Other long term (current) drug therapy: Secondary | ICD-10-CM

## 2014-09-04 DIAGNOSIS — I2510 Atherosclerotic heart disease of native coronary artery without angina pectoris: Secondary | ICD-10-CM

## 2014-09-04 NOTE — Patient Instructions (Signed)
Your physician recommends that you return for lab work fasting.  Your physician wants you to follow-up in: 1 year or sooner if needed with Dr. Kelly. You will receive a reminder letter in the mail two months in advance. If you don't receive a letter, please call our office to schedule the follow-up appointment. 

## 2014-09-04 NOTE — Progress Notes (Signed)
Patient ID: [name], male   DOB: Apr 18, 1925, 80 y.o.   MRN: [id number]     HPI: [name] is an 80 year old gentleman who is a former patient of Dr. Rollene Fare.  He presents to the office today for one-year cardiology evaluation.   [name] has a history of established coronary artery disease in April 2002 underwent stenting of his mid LAD with bare-metal stent. At that time he was found to have an occluded mid circumflex with right-to-left collaterals and no significant right coronary artery disease. He underwent a subsequent catheterization in 2008 and was found to have high-grade distal RCA stenosis for which he was underwent insertion of a 3.0x13 mm drug-eluting Cypher stent. His LAD stent was patent but he did have 70% stenosis in the diagonal vessel arising from the proximal portion of the stent. His distal circumflex again was subtotally occluded. Ejection fraction was 55%. His last Myoview study was in May 2012 which showed moderate inferior scar without significant ischemia. He has been on medical therapy for his coronary artery disease.  Additional problems include undergoing a left nephrectomy in 1992 for nonmalignant disease done by Dr. Rosana Hoes. He also has a history of myasthenia gravis for which he has been on chronic Mestinon therapy in the direction of Dr. Jannifer Franklin.  He is currently receiving Mestinon 60 mg 4 times a day in addition to CellCept 500 mg in the morning and 1000 g at bedtime. He  has undergone knee replacement surgery bilaterally.   Over the past year, he denies episodes of chest pain.  He is unaware of any palpitations.  He sees Dr. Delma Freeze, for primary care.  He has a history of hypertension as well as hyperlipidemia.  He has been taking metoprolol XL 50 mg daily as well as Crestor 10 mg.  He is on chronic Plavix 75 mg but does not take aspirin.  He had taken in the past Nizoral as well as Mycolog for superficial fungal infections but is not taking any presently.   He has remained fairly active. He denies any exertionally precipitated chest pain. He denies any awareness of palpitations. He denies presyncope or syncope.  Past Medical History  Diagnosis Date  . Coronary artery disease     sees Dr. Shelva Majestic   . Diverticulitis   . GERD (gastroesophageal reflux disease)   . Benign prostatic hypertrophy   . Partial small bowel obstruction 04/16/10    resolved with bowel rest  . Hypertension   . Shortness of breath   . Pulmonary embolus     2000  . Arthritis     back, hips, knees   . Cancer     h/o renal carcinoma   . Chronic kidney disease     h/o UTI-2012  . Obesity     exogenous  . Myasthenia gravis     sees Dr. Jannifer Franklin  . Osteoporosis   . Heart disease   . H/O epistaxis   . Urinary incontinence     Past Surgical History  Procedure Laterality Date  . Total knee arthroplasty      both Sees Dr. Novella Olive  . Nephrectomy      left  . Angioplasty      had 2 stent replacements  . Stented placed  1/08  . Colonoscopy  05/28/10    per Dr. Laurence Spates, diverticulosis, no repeats planned   . Sp kyphoplasty      and vertebroplasty  . Cardiac catheterization  2008, stents   . Eye surgery      cataracts(bilateral)  removed, ?iol  . Tonsillectomy    . Joint replacement      2005-R, L knee replacement- 2000  . Total knee revision  03/28/2012    Procedure: TOTAL KNEE REVISION;  Surgeon: Hessie Dibble, MD;  Location: [facility];  Service: Orthopedics;  Laterality: Right;  . Cardiac catheterization  2008  . Nm myoview ltd  02/2011    moderate inferior scar no ischemia  . Doppler echocardiography    . Stress dipyridamole myocardial perfusion      Allergies  Allergen Reactions  . Dicyclomine Other (See Comments)    REACTION: can worsen myasthenia gravis    Current Outpatient Prescriptions  Medication Sig Dispense Refill  . clopidogrel (PLAVIX) 75 MG tablet Take 1 tablet (75 mg total) by mouth daily. 30 tablet 5  . fluticasone  (FLONASE) 50 MCG/ACT nasal spray Place 2 sprays into both nostrils daily.    Marland Kitchen HYDROcodone-acetaminophen (NORCO/VICODIN) 5-325 MG per tablet Take 1 tablet by mouth every 6 (six) hours as needed for moderate pain. 120 tablet 0  . metoprolol succinate (TOPROL-XL) 50 MG 24 hr tablet TAKE 1 TABLET ONCE DAILY. 30 tablet 1  . Multiple Vitamin (MULTIVITAMIN WITH MINERALS) TABS Take 1 tablet by mouth daily.    . mycophenolate (CELLCEPT) 500 MG tablet Takes 500mg  in the morning and 1000mg  at bedtime    . pyridostigmine (MESTINON) 60 MG tablet Take 60 mg by mouth 4 (four) times daily.    . rosuvastatin (CRESTOR) 10 MG tablet Take 1 tablet (10 mg total) by mouth daily after breakfast. 30 tablet 8  . ketoconazole (NIZORAL) 2 % cream Apply 1 application topically daily. (Patient not taking: Reported on 09/04/2014) 15 g 0  . nystatin-triamcinolone (MYCOLOG II) cream Apply to affected area 3 times daily (Patient not taking: Reported on 09/04/2014) 30 g 1   No current facility-administered medications for this visit.    Social history is notable in that he was in the [employer] in [place] in the [employer]. He worked in [employer]. He is married for 63 years. He has 3 children. There is no recent tobacco use having quit greater than 35 years ago. No alcohol use. He previously had been active in [employer] and retired in 2007.  Family History  Problem Relation Age of Onset  . Colon cancer Mother    ROS General: Negative; No fevers, chills, or night sweats;  HEENT: Negative; No changes in vision or hearing, sinus congestion, difficulty swallowing Pulmonary: Negative; No cough, wheezing, shortness of breath, hemoptysis Cardiovascular: Negative; No chest pain, presyncope, syncope, palpitations GI: Negative; No nausea, vomiting, diarrhea, or abdominal pain GU: Negative; No dysuria, hematuria, or difficulty voiding Musculoskeletal: Negative; no myalgias, joint pain, or  weakness Hematologic/Oncology: Negative; no easy bruising, bleeding Endocrine: Negative; no heat/cold intolerance; no diabetes Neuro: Positive for myasthenia gravis, currently controlled and not progressing Skin: Negative; No rashes or skin lesions presently Psychiatric: Negative; No behavioral problems, depression Sleep: Negative; No snoring, daytime sleepiness, hypersomnolence, bruxism, restless legs, hypnogognic hallucinations, no cataplexy Other comprehensive 14 point system review is negative.   PE BP 130/80 mmHg  Pulse 85  Ht 5\' 9"  (1.753 m)  Wt 256 lb 4.8 oz (116.257 kg)  BMI 37.83 kg/m2 Repeat blood pressure by me was 138/82. Pulse 76 General: Alert, oriented, no distress.  Skin: normal turgor, no rashes HEENT: Normocephalic, atraumatic. Pupils round and reactive; sclera  anicteric; Fundi no hemorrhages or exudates the Nose without nasal septal hypertrophy Mouth/Parynx benign; Mallinpatti scale 3 Neck: Thick neck No JVD, no carotid bruits Lungs: clear to ausculatation and percussion; no wheezing or rales Chest wall: Nontender to palpation Heart: RRR, s1 s2 normal 1/6 systolic murmur. Abdomen: Moderate obesity; soft, nontender; no hepatosplenomehaly, BS+; abdominal aorta nontender and not dilated by palpation. Pulses 2+ Extremities: no clubbing cyanosis or edema, Homan's sign negative; no muscle tenderness to palpation Neurologic: grossly nonfocal Psychological: Normal affect and mood.  ECG (independently read by me): Normal sinus rhythm at 85 bpm.  Early transition.  QTc interval 466 ms.  PR interval 198 ms.  October 2014 ECG: Normal sinus rhythm with first-degree AV block with PR interval 224 ms. Early transition. Voltage criteria for left ventricular hypertrophy.  LABS:  BMET    Component Value Date/Time   NA 143 08/23/2014 1049   NA 137 04/07/2014 1542   K 4.3 08/23/2014 1049   CL 107 08/23/2014 1049   CO2 24 08/23/2014 1049   GLUCOSE 93 08/23/2014 1049    GLUCOSE 108* 04/07/2014 1542   BUN 18 08/23/2014 1049   BUN 21 04/07/2014 1542   CREATININE 1.01 08/23/2014 1049   CALCIUM 9.1 08/23/2014 1049   GFRNONAA 67 08/23/2014 1049   GFRAA 77 08/23/2014 1049     Hepatic Function Panel     Component Value Date/Time   PROT 6.7 08/23/2014 1049   PROT 6.9 10/28/2011 2322   ALBUMIN 3.4* 10/28/2011 2322   AST 24 08/23/2014 1049   ALT 22 08/23/2014 1049   ALKPHOS 60 08/23/2014 1049   BILITOT 0.6 08/23/2014 1049   BILIDIR 0.2 09/26/2009 0448   IBILI 0.6 09/26/2009 0448     CBC    Component Value Date/Time   WBC 3.3* 08/23/2014 1049   WBC 4.6 04/07/2014 1542   RBC 4.42 08/23/2014 1049   RBC 4.43 04/07/2014 1542   HGB 14.0 08/23/2014 1049   HCT 40.8 08/23/2014 1049   PLT 193 08/23/2014 1049   MCV 92 08/23/2014 1049   MCH 31.7 08/23/2014 1049   MCH 30.5 04/07/2014 1542   MCHC 34.3 08/23/2014 1049   MCHC 32.9 04/07/2014 1542   RDW 14.2 08/23/2014 1049   RDW 13.7 04/07/2014 1542   LYMPHSABS 1.4 08/23/2014 1049   LYMPHSABS 1.2 04/06/2014 0857   MONOABS 1.0 04/06/2014 0857   EOSABS 0.2 08/23/2014 1049   EOSABS 0.2 04/06/2014 0857   BASOSABS 0.0 08/23/2014 1049   BASOSABS 0.0 04/06/2014 0857     BNP No results found for: PROBNP  Lipid Panel  No results found for: CHOL   RADIOLOGY: No results found.   ASSESSMENT AND PLAN: Mr. Westberg is an active 80 year-old gentleman who has established coronary artery disease dating back to 2002 when he underwent initial stenting of his left anterior descending artery. His last cardiac catheterization was in 2008 at which time he underwent stenting of his distal RCA. He has documented subtotal occlusion of his distal circumflex vessel for which she's been on chronic beta blocker therapy.Marland Kitchen His last nuclear perfusion study in May 2012 show a small region of inferior scar. Clinically, he is doing well on his current therapy.  His blood pressure today is controlled on current therapy.  He has been  taking Crestor 10 mg for hyperlipidemia.  He has not had recent laboratory obtained with reference to this.  He denies bleeding issues on chronic Plavix for antiplatelet benefit.  Remotely, he had taken Nizoral  as well as nystatin-try and sit alone and I have suggested that if he does need additional antifungal therapy in the future that he just take the nystatin topically rather than the Nizoral.  Laboratory will be obtained in the fasting state.  Adjustments will be made to his medical therapy.  He does have moderate obesity with a body mass index of 37.83.  Weight reduction was recommended.  He continues to be active.  He's not having recurrent angina.  His myasthenia is gravis appears to be stable on current therapy.  As long as he remains stable, I will see him in one year for reevaluation.  I spent: 25 minutes Troy Sine, MD, [facility] 09/04/2014 4:46 PM

## 2014-09-10 ENCOUNTER — Other Ambulatory Visit: Payer: Self-pay | Admitting: Cardiovascular Disease

## 2014-09-10 ENCOUNTER — Other Ambulatory Visit: Payer: Self-pay | Admitting: Neurology

## 2014-09-11 NOTE — Telephone Encounter (Signed)
Rx was sent to pharmacy electronically. 

## 2014-09-15 ENCOUNTER — Emergency Department (HOSPITAL_COMMUNITY)
Admission: EM | Admit: 2014-09-15 | Discharge: 2014-09-15 | Disposition: A | Discharge status: Home / Self Care | Payer: Medicare Other | Attending: Emergency Medicine | Admitting: Emergency Medicine

## 2014-09-15 ENCOUNTER — Encounter (HOSPITAL_COMMUNITY): Payer: Self-pay

## 2014-09-15 DIAGNOSIS — Z7902 Long term (current) use of antithrombotics/antiplatelets: Secondary | ICD-10-CM | POA: Insufficient documentation

## 2014-09-15 DIAGNOSIS — R300 Dysuria: Secondary | ICD-10-CM | POA: Diagnosis present

## 2014-09-15 DIAGNOSIS — G7000 Myasthenia gravis without (acute) exacerbation: Secondary | ICD-10-CM | POA: Insufficient documentation

## 2014-09-15 DIAGNOSIS — Z79899 Other long term (current) drug therapy: Secondary | ICD-10-CM | POA: Diagnosis not present

## 2014-09-15 DIAGNOSIS — Z7951 Long term (current) use of inhaled steroids: Secondary | ICD-10-CM | POA: Insufficient documentation

## 2014-09-15 DIAGNOSIS — Z8719 Personal history of other diseases of the digestive system: Secondary | ICD-10-CM | POA: Insufficient documentation

## 2014-09-15 DIAGNOSIS — R040 Epistaxis: Secondary | ICD-10-CM | POA: Insufficient documentation

## 2014-09-15 DIAGNOSIS — Z86711 Personal history of pulmonary embolism: Secondary | ICD-10-CM | POA: Diagnosis not present

## 2014-09-15 DIAGNOSIS — N189 Chronic kidney disease, unspecified: Secondary | ICD-10-CM | POA: Diagnosis not present

## 2014-09-15 DIAGNOSIS — Z9889 Other specified postprocedural states: Secondary | ICD-10-CM | POA: Insufficient documentation

## 2014-09-15 DIAGNOSIS — Z85528 Personal history of other malignant neoplasm of kidney: Secondary | ICD-10-CM | POA: Insufficient documentation

## 2014-09-15 DIAGNOSIS — I129 Hypertensive chronic kidney disease with stage 1 through stage 4 chronic kidney disease, or unspecified chronic kidney disease: Secondary | ICD-10-CM | POA: Diagnosis not present

## 2014-09-15 DIAGNOSIS — Z87891 Personal history of nicotine dependence: Secondary | ICD-10-CM | POA: Diagnosis not present

## 2014-09-15 DIAGNOSIS — Z87440 Personal history of urinary (tract) infections: Secondary | ICD-10-CM | POA: Insufficient documentation

## 2014-09-15 DIAGNOSIS — I2510 Atherosclerotic heart disease of native coronary artery without angina pectoris: Secondary | ICD-10-CM | POA: Diagnosis not present

## 2014-09-15 DIAGNOSIS — Z9861 Coronary angioplasty status: Secondary | ICD-10-CM | POA: Diagnosis not present

## 2014-09-15 DIAGNOSIS — M1389 Other specified arthritis, multiple sites: Secondary | ICD-10-CM | POA: Insufficient documentation

## 2014-09-15 DIAGNOSIS — E669 Obesity, unspecified: Secondary | ICD-10-CM | POA: Diagnosis not present

## 2014-09-15 DIAGNOSIS — N419 Inflammatory disease of prostate, unspecified: Secondary | ICD-10-CM | POA: Insufficient documentation

## 2014-09-15 LAB — URINE MICROSCOPIC-ADD ON

## 2014-09-15 LAB — CBC WITH DIFFERENTIAL/PLATELET
BASOS PCT: 0 % (ref 0–1)
Basophils Absolute: 0 10*3/uL (ref 0.0–0.1)
Eosinophils Absolute: 0.1 10*3/uL (ref 0.0–0.7)
Eosinophils Relative: 1 % (ref 0–5)
HCT: 41.2 % (ref 39.0–52.0)
HEMOGLOBIN: 13.5 g/dL (ref 13.0–17.0)
Lymphocytes Relative: 13 % (ref 12–46)
Lymphs Abs: 1 10*3/uL (ref 0.7–4.0)
MCH: 31.1 pg (ref 26.0–34.0)
MCHC: 32.8 g/dL (ref 30.0–36.0)
MCV: 94.9 fL (ref 78.0–100.0)
MONOS PCT: 11 % (ref 3–12)
Monocytes Absolute: 0.9 10*3/uL (ref 0.1–1.0)
NEUTROS ABS: 5.8 10*3/uL (ref 1.7–7.7)
Neutrophils Relative %: 75 % (ref 43–77)
Platelets: 167 10*3/uL (ref 150–400)
RBC: 4.34 MIL/uL (ref 4.22–5.81)
RDW: 13.6 % (ref 11.5–15.5)
WBC: 7.8 10*3/uL (ref 4.0–10.5)

## 2014-09-15 LAB — URINALYSIS, ROUTINE W REFLEX MICROSCOPIC
Bilirubin Urine: NEGATIVE
Glucose, UA: NEGATIVE mg/dL
Hgb urine dipstick: NEGATIVE
Ketones, ur: NEGATIVE mg/dL
Nitrite: NEGATIVE
Protein, ur: NEGATIVE mg/dL
Specific Gravity, Urine: 1.02 (ref 1.005–1.030)
Urobilinogen, UA: 0.2 mg/dL (ref 0.0–1.0)
pH: 5.5 (ref 5.0–8.0)

## 2014-09-15 LAB — I-STAT CHEM 8, ED
BUN: 18 mg/dL (ref 6–23)
CHLORIDE: 105 meq/L (ref 96–112)
Calcium, Ion: 1.2 mmol/L (ref 1.13–1.30)
Creatinine, Ser: 1 mg/dL (ref 0.50–1.35)
GLUCOSE: 90 mg/dL (ref 70–99)
HEMATOCRIT: 43 % (ref 39.0–52.0)
Hemoglobin: 14.6 g/dL (ref 13.0–17.0)
POTASSIUM: 4 meq/L (ref 3.7–5.3)
SODIUM: 138 meq/L (ref 137–147)
TCO2: 20 mmol/L (ref 0–100)

## 2014-09-15 MED ORDER — SULFAMETHOXAZOLE-TRIMETHOPRIM 800-160 MG PO TABS: 1.0000 | ORAL_TABLET | Freq: Two times a day (BID) | ORAL | Status: DC

## 2014-09-15 NOTE — Discharge Instructions (Signed)

## 2014-09-15 NOTE — ED Provider Notes (Signed)
CSN: [id number]     Arrival date & time 09/15/14  0807 History   First MD Initiated Contact with Patient 09/15/14 1042     Chief Complaint  Patient presents with  . Dysuria     (Consider location/radiation/quality/duration/timing/severity/associated sxs/prior Treatment) Patient is a 80 y.o. male presenting with dysuria. The history is provided by the patient.  Dysuria Pertinent negatives include no chest pain, no abdominal pain, no headaches and no shortness of breath.   patient with dysuria and frequency. States he has a history of urinary tract infections. States he had medicine about a year ago and he hasn't had one since. States his been going more frequently at night. States he feels as if he has to go on that only little comes out. No fevers. No chest pain. No cough. No lightheadedness dizziness. States that he did have a little bit of blood coming out of his nose. States he has some sinus problems. No recent antibiotics.  Past Medical History  Diagnosis Date  . Coronary artery disease     sees Dr. Shelva Majestic   . Diverticulitis   . GERD (gastroesophageal reflux disease)   . Benign prostatic hypertrophy   . Partial small bowel obstruction 04/16/10    resolved with bowel rest  . Hypertension   . Shortness of breath   . Pulmonary embolus     2000  . Arthritis     back, hips, knees   . Cancer     h/o renal carcinoma   . Chronic kidney disease     h/o UTI-2012  . Obesity     exogenous  . Myasthenia gravis     sees Dr. Jannifer Franklin  . Osteoporosis   . Heart disease   . H/O epistaxis   . Urinary incontinence    Past Surgical History  Procedure Laterality Date  . Total knee arthroplasty      both Sees Dr. Novella Olive  . Nephrectomy      left  . Angioplasty      had 2 stent replacements  . Stented placed  1/08  . Colonoscopy  05/28/10    per Dr. Laurence Spates, diverticulosis, no repeats planned   . Sp kyphoplasty      and vertebroplasty  . Cardiac catheterization     2008, stents   . Eye surgery      cataracts(bilateral)  removed, ?iol  . Tonsillectomy    . Joint replacement      2005-R, L knee replacement- 2000  . Total knee revision  03/28/2012    Procedure: TOTAL KNEE REVISION;  Surgeon: Hessie Dibble, MD;  Location: [facility];  Service: Orthopedics;  Laterality: Right;  . Cardiac catheterization  2008  . Nm myoview ltd  02/2011    moderate inferior scar no ischemia  . Doppler echocardiography    . Stress dipyridamole myocardial perfusion     Family History  Problem Relation Age of Onset  . Colon cancer Mother    History  Substance Use Topics  . Smoking status: Former Smoker    Types: Cigarettes    Quit date: 03/22/1959  . Smokeless tobacco: Never Used     Comment: quit 1970s  . Alcohol Use: No    Review of Systems  Constitutional: Positive for fatigue. Negative for activity change and appetite change.  HENT: Positive for nosebleeds.   Eyes: Negative for pain.  Respiratory: Negative for chest tightness and shortness of breath.   Cardiovascular: Negative for chest pain and leg  swelling.  Gastrointestinal: Negative for nausea, vomiting, abdominal pain and diarrhea.  Genitourinary: Positive for dysuria. Negative for flank pain.  Musculoskeletal: Negative for back pain and neck stiffness.  Skin: Negative for rash.  Neurological: Negative for weakness, numbness and headaches.  Psychiatric/Behavioral: Negative for behavioral problems.      Allergies  Dicyclomine  Home Medications   Prior to Admission medications   Medication Sig Start Date End Date Taking? Authorizing Provider  clopidogrel (PLAVIX) 75 MG tablet Take 1 tablet (75 mg total) by mouth daily. 04/24/14  Yes Troy Sine, MD  CRESTOR 10 MG tablet TAKE 1 TABLET ONCE A DAY AFTER BREAKFAST. 09/11/14  Yes Troy Sine, MD  fluticasone (FLONASE) 50 MCG/ACT nasal spray Place 2 sprays into both nostrils daily. 09/03/13  Yes [name], MD  HYDROcodone-acetaminophen  (NORCO/VICODIN) 5-325 MG per tablet Take 1 tablet by mouth every 6 (six) hours as needed for moderate pain. 06/07/14  Yes [name], MD  ketoconazole (NIZORAL) 2 % cream Apply 1 application topically daily. 02/23/14  Yes Hollace Kinnier Sofia, PA-C  metoprolol succinate (TOPROL-XL) 50 MG 24 hr tablet TAKE 1 TABLET ONCE DAILY. 08/13/14  Yes Troy Sine, MD  Multiple Vitamin (MULTIVITAMIN WITH MINERALS) TABS Take 1 tablet by mouth daily.   Yes Historical Provider, MD  mycophenolate (CELLCEPT) 500 MG tablet TAKE 1 TABLET EVERY MORNING AND 2 TABLETS AT BEDTIME. 09/11/14  Yes Kathrynn Ducking, MD  nystatin-triamcinolone [facility] II) cream Apply to affected area 3 times daily Patient taking differently: Apply 1 application topically 3 (three) times daily.  02/21/14  Yes Liam Graham, PA-C  pyridostigmine (MESTINON) 60 MG tablet Take 60 mg by mouth 4 (four) times daily.   Yes Historical Provider, MD  sulfamethoxazole-trimethoprim (BACTRIM DS,SEPTRA DS) 800-160 MG per tablet Take 1 tablet by mouth 2 (two) times daily. 09/15/14   Jasper Riling. Selma Mink, MD   BP 140/76 mmHg  Pulse 87  Temp(Src) 98.2 F (36.8 C) (Oral)  Resp 18  SpO2 95% Physical Exam  Constitutional: He appears well-developed.  Cardiovascular: Normal rate and regular rhythm.   Pulmonary/Chest: Effort normal.  Abdominal: Soft. There is no tenderness.  Genitourinary:  No CVA tenderness    ED Course  Procedures (including critical care time) Labs Review Labs Reviewed  URINALYSIS, ROUTINE W REFLEX MICROSCOPIC - Abnormal; Notable for the following:    Leukocytes, UA MODERATE (*)    All other components within normal limits  CBC WITH DIFFERENTIAL  URINE MICROSCOPIC-ADD ON  I-STAT CHEM 8, ED    Imaging Review No results found.   EKG Interpretation None      MDM   Final diagnoses:  Prostatitis, unspecified prostatitis type    Patient with dysuria. History of infections. He has white cells in the urine without bacteria.  We'll treat his prostatitis and have follow-up with urology.    Jasper Riling. Alvino Chapel, MD 09/15/14 1254

## 2014-09-15 NOTE — ED Notes (Signed)
He c/o dysuria without fever or abd. Pain x 3-4 days.  He recognizes this as being similar to prior s/s dx as u.t.i.  He is in no distress.

## 2014-09-18 LAB — COMPREHENSIVE METABOLIC PANEL
A/G RATIO: 1.3 (ref 1.1–2.5)
ALT: 22 IU/L (ref 0–44)
AST: 29 IU/L (ref 0–40)
Albumin: 3.8 g/dL (ref 3.5–4.7)
Alkaline Phosphatase: 64 IU/L (ref 39–117)
BUN/Creatinine Ratio: 19 (ref 10–22)
BUN: 19 mg/dL (ref 8–27)
CO2: 21 mmol/L (ref 18–29)
Calcium: 9.1 mg/dL (ref 8.6–10.2)
Chloride: 102 mmol/L (ref 97–108)
Creatinine, Ser: 1 mg/dL (ref 0.76–1.27)
GFR calc Af Amer: 78 mL/min/{1.73_m2} (ref >59)
GFR, EST NON AFRICAN AMERICAN: 67 mL/min/{1.73_m2} (ref >59)
Globulin, Total: 2.9 g/dL (ref 1.5–4.5)
Glucose: 74 mg/dL (ref 65–99)
POTASSIUM: 4.2 mmol/L (ref 3.5–5.2)
SODIUM: 138 mmol/L (ref 134–144)
Total Bilirubin: 0.5 mg/dL (ref 0.0–1.2)
Total Protein: 6.7 g/dL (ref 6.0–8.5)

## 2014-09-18 LAB — CBC
HCT: 42.8 % (ref 37.5–51.0)
Hemoglobin: 13.8 g/dL (ref 12.6–17.7)
MCH: 30.3 pg (ref 26.6–33.0)
MCHC: 32.2 g/dL (ref 31.5–35.7)
MCV: 94 fL (ref 79–97)
PLATELETS: 190 10*3/uL (ref 150–379)
RBC: 4.56 x10E6/uL (ref 4.14–5.80)
RDW: 14 % (ref 12.3–15.4)
WBC: 3.1 10*3/uL — ABNORMAL LOW (ref 3.4–10.8)

## 2014-09-18 LAB — LIPID PANEL
Chol/HDL Ratio: 3.5 ratio units (ref 0.0–5.0)
Cholesterol, Total: 118 mg/dL (ref 100–199)
HDL: 34 mg/dL — ABNORMAL LOW (ref >39)
LDL Calculated: 53 mg/dL (ref 0–99)
Triglycerides: 154 mg/dL — ABNORMAL HIGH (ref 0–149)
VLDL Cholesterol Cal: 31 mg/dL (ref 5–40)

## 2014-09-18 LAB — TSH: TSH: 2.7 u[IU]/mL (ref 0.450–4.500)

## 2014-09-21 ENCOUNTER — Emergency Department (HOSPITAL_COMMUNITY): Payer: Medicare Other

## 2014-09-21 ENCOUNTER — Emergency Department (HOSPITAL_COMMUNITY)
Admission: EM | Admit: 2014-09-21 | Discharge: 2014-09-21 | Disposition: A | Discharge status: Home / Self Care | Payer: Medicare Other | Attending: Emergency Medicine | Admitting: Emergency Medicine

## 2014-09-21 ENCOUNTER — Encounter (HOSPITAL_COMMUNITY): Payer: Self-pay | Admitting: Emergency Medicine

## 2014-09-21 ENCOUNTER — Emergency Department (INDEPENDENT_AMBULATORY_CARE_PROVIDER_SITE_OTHER)
Admission: EM | Admit: 2014-09-21 | Discharge: 2014-09-21 | Disposition: A | Discharge status: Nursing Home (Includes ICF) | Payer: Medicare Other | Source: Home / Self Care | Attending: Emergency Medicine | Admitting: Emergency Medicine

## 2014-09-21 ENCOUNTER — Encounter (HOSPITAL_COMMUNITY): Payer: Self-pay | Admitting: Adult Health

## 2014-09-21 DIAGNOSIS — S0031XA Abrasion of nose, initial encounter: Secondary | ICD-10-CM | POA: Insufficient documentation

## 2014-09-21 DIAGNOSIS — W19XXXA Unspecified fall, initial encounter: Secondary | ICD-10-CM

## 2014-09-21 DIAGNOSIS — Y998 Other external cause status: Secondary | ICD-10-CM | POA: Diagnosis not present

## 2014-09-21 DIAGNOSIS — R51 Headache: Secondary | ICD-10-CM | POA: Insufficient documentation

## 2014-09-21 DIAGNOSIS — E669 Obesity, unspecified: Secondary | ICD-10-CM | POA: Insufficient documentation

## 2014-09-21 DIAGNOSIS — M79641 Pain in right hand: Secondary | ICD-10-CM

## 2014-09-21 DIAGNOSIS — K219 Gastro-esophageal reflux disease without esophagitis: Secondary | ICD-10-CM | POA: Diagnosis not present

## 2014-09-21 DIAGNOSIS — Y9389 Activity, other specified: Secondary | ICD-10-CM | POA: Insufficient documentation

## 2014-09-21 DIAGNOSIS — I2510 Atherosclerotic heart disease of native coronary artery without angina pectoris: Secondary | ICD-10-CM | POA: Insufficient documentation

## 2014-09-21 DIAGNOSIS — Z79899 Other long term (current) drug therapy: Secondary | ICD-10-CM | POA: Insufficient documentation

## 2014-09-21 DIAGNOSIS — S4991XA Unspecified injury of right shoulder and upper arm, initial encounter: Secondary | ICD-10-CM | POA: Insufficient documentation

## 2014-09-21 DIAGNOSIS — R52 Pain, unspecified: Secondary | ICD-10-CM

## 2014-09-21 DIAGNOSIS — Z86711 Personal history of pulmonary embolism: Secondary | ICD-10-CM | POA: Diagnosis not present

## 2014-09-21 DIAGNOSIS — W010XXA Fall on same level from slipping, tripping and stumbling without subsequent striking against object, initial encounter: Secondary | ICD-10-CM | POA: Diagnosis not present

## 2014-09-21 DIAGNOSIS — I129 Hypertensive chronic kidney disease with stage 1 through stage 4 chronic kidney disease, or unspecified chronic kidney disease: Secondary | ICD-10-CM | POA: Diagnosis not present

## 2014-09-21 DIAGNOSIS — M1990 Unspecified osteoarthritis, unspecified site: Secondary | ICD-10-CM | POA: Diagnosis not present

## 2014-09-21 DIAGNOSIS — Z9889 Other specified postprocedural states: Secondary | ICD-10-CM | POA: Diagnosis not present

## 2014-09-21 DIAGNOSIS — Z85528 Personal history of other malignant neoplasm of kidney: Secondary | ICD-10-CM | POA: Insufficient documentation

## 2014-09-21 DIAGNOSIS — Z7902 Long term (current) use of antithrombotics/antiplatelets: Secondary | ICD-10-CM | POA: Insufficient documentation

## 2014-09-21 DIAGNOSIS — Z872 Personal history of diseases of the skin and subcutaneous tissue: Secondary | ICD-10-CM | POA: Diagnosis not present

## 2014-09-21 DIAGNOSIS — Z7951 Long term (current) use of inhaled steroids: Secondary | ICD-10-CM | POA: Diagnosis not present

## 2014-09-21 DIAGNOSIS — R519 Headache, unspecified: Secondary | ICD-10-CM

## 2014-09-21 DIAGNOSIS — Y9289 Other specified places as the place of occurrence of the external cause: Secondary | ICD-10-CM | POA: Insufficient documentation

## 2014-09-21 DIAGNOSIS — M25511 Pain in right shoulder: Secondary | ICD-10-CM

## 2014-09-21 DIAGNOSIS — Z9861 Coronary angioplasty status: Secondary | ICD-10-CM | POA: Insufficient documentation

## 2014-09-21 DIAGNOSIS — N189 Chronic kidney disease, unspecified: Secondary | ICD-10-CM | POA: Insufficient documentation

## 2014-09-21 DIAGNOSIS — R040 Epistaxis: Secondary | ICD-10-CM

## 2014-09-21 NOTE — ED Notes (Signed)
Dr. Jacubowitz at bedside 

## 2014-09-21 NOTE — ED Notes (Signed)
Patient returned from CT

## 2014-09-21 NOTE — ED Notes (Signed)
Patient is asking for food and pain medication for his arm.

## 2014-09-21 NOTE — ED Notes (Signed)
To x-ray

## 2014-09-21 NOTE — ED Notes (Signed)
To ct

## 2014-09-21 NOTE — ED Provider Notes (Signed)
CSN: [id number]     Arrival date & time 09/21/14  1620 History   First MD Initiated Contact with Patient 09/21/14 1719     Chief Complaint  Patient presents with  . Fall  . Shoulder Pain   (Consider location/radiation/quality/duration/timing/severity/associated sxs/prior Treatment) HPI Comments: Patient was walking through a room at the [facility] around lunchtime today and accidentally stumbled over a cornhole game that he did not see on the floor. States that to the best of his recollection, he landed on his face and right side. Has had persistent pain at right upper arm and shoulder, right hand, nose and a right frontal headache since fall. Took pain medication at home (Vicodin) and when pain persisted despite taking Vicodin he decided to seek evaluation. Denies known loss of consciousness during the fall.  Takes daily anticoagulation medication (plavix) following angioplasty with stent placement in 2008.  Patient is a 80 y.o. male presenting with fall and shoulder pain. The history is provided by the patient.  Fall This is a new problem. Associated symptoms include headaches.  Shoulder Pain   Past Medical History  Diagnosis Date  . Coronary artery disease     sees Dr. Shelva Majestic   . Diverticulitis   . GERD (gastroesophageal reflux disease)   . Benign prostatic hypertrophy   . Partial small bowel obstruction 04/16/10    resolved with bowel rest  . Hypertension   . Shortness of breath   . Pulmonary embolus     2000  . Arthritis     back, hips, knees   . Cancer     h/o renal carcinoma   . Chronic kidney disease     h/o UTI-2012  . Obesity     exogenous  . Myasthenia gravis     sees Dr. Jannifer Franklin  . Osteoporosis   . Heart disease   . H/O epistaxis   . Urinary incontinence    Past Surgical History  Procedure Laterality Date  . Total knee arthroplasty      both Sees Dr. Novella Olive  . Nephrectomy      left  . Angioplasty      had 2 stent replacements  . Stented placed   1/08  . Colonoscopy  05/28/10    per Dr. Laurence Spates, diverticulosis, no repeats planned   . Sp kyphoplasty      and vertebroplasty  . Cardiac catheterization      2008, stents   . Eye surgery      cataracts(bilateral)  removed, ?iol  . Tonsillectomy    . Joint replacement      2005-R, L knee replacement- 2000  . Total knee revision  03/28/2012    Procedure: TOTAL KNEE REVISION;  Surgeon: Hessie Dibble, MD;  Location: [place];  Service: Orthopedics;  Laterality: Right;  . Cardiac catheterization  2008  . Nm myoview ltd  02/2011    moderate inferior scar no ischemia  . Doppler echocardiography    . Stress dipyridamole myocardial perfusion     Family History  Problem Relation Age of Onset  . Colon cancer Mother    History  Substance Use Topics  . Smoking status: Former Smoker    Types: Cigarettes    Quit date: 03/22/1959  . Smokeless tobacco: Never Used     Comment: quit 1970s  . Alcohol Use: No    Review of Systems  Constitutional: Negative.   HENT: Positive for nosebleeds.   Eyes: Negative.   Respiratory: Negative.  Cardiovascular: Negative.   Gastrointestinal: Negative.   Musculoskeletal: Positive for joint swelling.  Skin: Negative.   Neurological: Positive for headaches. Negative for dizziness and syncope.    Allergies  Dicyclomine  Home Medications   Prior to Admission medications   Medication Sig Start Date End Date Taking? Authorizing Provider  clopidogrel (PLAVIX) 75 MG tablet Take 1 tablet (75 mg total) by mouth daily. 04/24/14   Troy Sine, MD  CRESTOR 10 MG tablet TAKE 1 TABLET ONCE A DAY AFTER BREAKFAST. 09/11/14   Troy Sine, MD  fluticasone (FLONASE) 50 MCG/ACT nasal spray Place 2 sprays into both nostrils daily. 09/03/13   [name], MD  HYDROcodone-acetaminophen (NORCO/VICODIN) 5-325 MG per tablet Take 1 tablet by mouth every 6 (six) hours as needed for moderate pain. 06/07/14   [name], MD  ketoconazole (NIZORAL) 2 % cream Apply 1  application topically daily. 02/23/14   Fransico Meadow, PA-C  metoprolol succinate (TOPROL-XL) 50 MG 24 hr tablet TAKE 1 TABLET ONCE DAILY. 08/13/14   Troy Sine, MD  Multiple Vitamin (MULTIVITAMIN WITH MINERALS) TABS Take 1 tablet by mouth daily.    Historical Provider, MD  mycophenolate (CELLCEPT) 500 MG tablet TAKE 1 TABLET EVERY MORNING AND 2 TABLETS AT BEDTIME. 09/11/14   Kathrynn Ducking, MD  nystatin-triamcinolone [facility] II) cream Apply to affected area 3 times daily Patient taking differently: Apply 1 application topically 3 (three) times daily.  02/21/14   Liam Graham, PA-C  pyridostigmine (MESTINON) 60 MG tablet Take 60 mg by mouth 4 (four) times daily.    Historical Provider, MD  sulfamethoxazole-trimethoprim (BACTRIM DS,SEPTRA DS) 800-160 MG per tablet Take 1 tablet by mouth 2 (two) times daily. 09/15/14   Jasper Riling. Pick[place], MD   BP 153/73 mmHg  Pulse 73  Resp 16  SpO2 94% Physical Exam  Constitutional: He is oriented to person, place, and time. He appears well-developed and well-nourished. No distress.  HENT:  Head: Normocephalic. Head is with contusion.  Right Ear: Hearing, tympanic membrane, external ear and ear canal normal. No hemotympanum.  Left Ear: Hearing, tympanic membrane, external ear and ear canal normal. No hemotympanum.  Nose: Sinus tenderness present. No nasal septal hematoma. Epistaxis is observed.  Mouth/Throat: Uvula is midline, oropharynx is clear and moist and mucous membranes are normal.  Nose is contused with dried blood in each nostril Normal dentition No malocclusion  Eyes: Conjunctivae are normal.  Neck: Normal range of motion and full passive range of motion without pain. Neck supple. No spinous process tenderness and no muscular tenderness present. Normal range of motion present.  Cardiovascular: Normal rate, regular rhythm and normal heart sounds.   Pulmonary/Chest: Effort normal and breath sounds normal.  Abdominal: Soft. Bowel sounds are  normal. He exhibits no distension. There is no tenderness.  Musculoskeletal:       Right shoulder: He exhibits decreased range of motion, tenderness and pain. He exhibits no swelling, no effusion, no crepitus, no deformity and no laceration.       Right elbow: Normal.      Right wrist: Normal.       Right hip: Normal.       Left hip: Normal.       Arms:      Right hand: He exhibits bony tenderness. He exhibits normal range of motion, normal capillary refill, no deformity, no laceration and no swelling. Normal sensation noted. Normal strength noted.       Hands: No tenderness  along right clavicle. Right shoulder does not appear dislocated  Neurological: He is alert and oriented to person, place, and time.  Skin: Skin is warm and dry.  +intact  Psychiatric: He has a normal mood and affect. His behavior is normal.  Nursing note and vitals reviewed.   ED Course  Procedures (including critical care time) Labs Review Labs Reviewed - No data to display  Imaging Review No results found.   MDM   1. Fall, initial encounter   2. Nonintractable headache, unspecified chronicity pattern, unspecified headache type   3. Right hand pain   4. Right shoulder pain   5. Epistaxis   Elderly anticoagulated male s/p mechanical fall with associated facial trauma and clinical suspicion for fracture of proximal humerus.  Will transfer to [facility] ER for further evaluation and probable head CT imaging and radiographs of right shoulder/humerus. These services are not available at [facility] facility.     Lutricia Feil, [place] 09/21/14 [phone]

## 2014-09-21 NOTE — ED Notes (Signed)
Patient transported to CT 

## 2014-09-21 NOTE — ED Notes (Signed)
Pt returned from c-t.Marland Kitchen  No food order at [employer]

## 2014-09-21 NOTE — Discharge Instructions (Signed)
Take your Norco (vicodin) as needed for pain. See your primary care physician if having significant pain in a week. CAT scan tonight of your brain and shoulder x-rays showed no new abnormalities or broken bones

## 2014-09-21 NOTE — ED Notes (Signed)
Food given getting ready for discahrge

## 2014-09-21 NOTE — ED Notes (Signed)
The pt tripped and fell over a wooden stick earlier tonight.  No loc  Abrasion to his nose.   The pot has pain in his rt shoulder limited movement.  Distal radial pulse present

## 2014-09-21 NOTE — ED Notes (Signed)
Pt sustained a fall today at the [employer] he was walking across the dance floor when he tripped over a [employer] on right side and also hit his nose; not sure if he sustained LOC C/o right shoulder/arm/hand pain Alert and talking in complete sentences w/no signs of acute distress.

## 2014-09-21 NOTE — ED Notes (Signed)
Pt waiting for c-t of his head.  He is very impatient

## 2014-09-21 NOTE — ED Provider Notes (Signed)
CSN: [id number]     Arrival date & time 09/21/14  1756 History   First MD Initiated Contact with Patient 09/21/14 1916     Chief Complaint  Patient presents with  . Fall     (Consider location/radiation/quality/duration/timing/severity/associated sxs/prior Treatment) HPI Patient fell while walking 1:30 PM today at the [employer] after he tripped over an object. He said complains of right shoulder pain and frontal headache since the event. He suffered a bloody nose as result of the fall. He denies loss of consciousness. No other associated symptoms. Evaluated at [facility]. Sent here for further evaluation. Note other associated symptoms, no neck pain. No focal numbness or weakness. Past Medical History  Diagnosis Date  . Coronary artery disease     sees Dr. Shelva Majestic   . Diverticulitis   . GERD (gastroesophageal reflux disease)   . Benign prostatic hypertrophy   . Partial small bowel obstruction 04/16/10    resolved with bowel rest  . Hypertension   . Shortness of breath   . Pulmonary embolus     2000  . Arthritis     back, hips, knees   . Cancer     h/o renal carcinoma   . Chronic kidney disease     h/o UTI-2012  . Obesity     exogenous  . Myasthenia gravis     sees Dr. Jannifer Franklin  . Osteoporosis   . Heart disease   . H/O epistaxis   . Urinary incontinence    Past Surgical History  Procedure Laterality Date  . Total knee arthroplasty      both Sees Dr. Novella Olive  . Nephrectomy      left  . Angioplasty      had 2 stent replacements  . Stented placed  1/08  . Colonoscopy  05/28/10    per Dr. Laurence Spates, diverticulosis, no repeats planned   . Sp kyphoplasty      and vertebroplasty  . Cardiac catheterization      2008, stents   . Eye surgery      cataracts(bilateral)  removed, ?iol  . Tonsillectomy    . Joint replacement      2005-R, L knee replacement- 2000  . Total knee revision  03/28/2012    Procedure: TOTAL KNEE REVISION;  Surgeon: Hessie Dibble, MD;  Location: [facility];  Service: Orthopedics;  Laterality: Right;  . Cardiac catheterization  2008  . Nm myoview ltd  02/2011    moderate inferior scar no ischemia  . Doppler echocardiography    . Stress dipyridamole myocardial perfusion     Family History  Problem Relation Age of Onset  . Colon cancer Mother    History  Substance Use Topics  . Smoking status: Former Smoker    Types: Cigarettes    Quit date: 03/22/1959  . Smokeless tobacco: Never Used     Comment: quit 1970s  . Alcohol Use: No    Review of Systems  Constitutional: Negative.   HENT: Positive for nosebleeds.   Respiratory: Negative.   Cardiovascular: Negative.   Gastrointestinal: Negative.   Musculoskeletal: Positive for arthralgias.  Skin: Positive for wound.       Abrasion to nose  Neurological: Positive for headaches.  Psychiatric/Behavioral: Negative.   All other systems reviewed and are negative.     Allergies  Dicyclomine  Home Medications   Prior to Admission medications   Medication Sig Start Date End Date Taking? Authorizing Provider  clopidogrel (PLAVIX)  75 MG tablet Take 1 tablet (75 mg total) by mouth daily. 04/24/14   Troy Sine, MD  CRESTOR 10 MG tablet TAKE 1 TABLET ONCE A DAY AFTER BREAKFAST. 09/11/14   Troy Sine, MD  fluticasone (FLONASE) 50 MCG/ACT nasal spray Place 2 sprays into both nostrils daily. 09/03/13   [name], MD  HYDROcodone-acetaminophen (NORCO/VICODIN) 5-325 MG per tablet Take 1 tablet by mouth every 6 (six) hours as needed for moderate pain. 06/07/14   [name], MD  ketoconazole (NIZORAL) 2 % cream Apply 1 application topically daily. 02/23/14   Fransico Meadow, PA-C  metoprolol succinate (TOPROL-XL) 50 MG 24 hr tablet TAKE 1 TABLET ONCE DAILY. 08/13/14   Troy Sine, MD  Multiple Vitamin (MULTIVITAMIN WITH MINERALS) TABS Take 1 tablet by mouth daily.    Historical Provider, MD  mycophenolate (CELLCEPT) 500 MG tablet TAKE 1 TABLET EVERY MORNING  AND 2 TABLETS AT BEDTIME. 09/11/14   Kathrynn Ducking, MD  nystatin-triamcinolone [facility] II) cream Apply to affected area 3 times daily Patient taking differently: Apply 1 application topically 3 (three) times daily.  02/21/14   Liam Graham, PA-C  pyridostigmine (MESTINON) 60 MG tablet Take 60 mg by mouth 4 (four) times daily.    Historical Provider, MD  sulfamethoxazole-trimethoprim (BACTRIM DS,SEPTRA DS) 800-160 MG per tablet Take 1 tablet by mouth 2 (two) times daily. 09/15/14   Jasper Riling. Pickering, MD   BP 139/73 mmHg  Pulse 64  Temp(Src) 97.5 F (36.4 C) (Oral)  Resp 18  SpO2 95% Physical Exam  Constitutional: He is oriented to person, place, and time. He appears well-developed and well-nourished. No distress.  Alert Glasgow Coma Score 15  HENT:  Head: Normocephalic and atraumatic.  Abrasion to nose. Dried blood at right nary. No active bleeding. No septal hematoma. No bony tenderness of nose. No deformity otherwise atraumatic  Eyes: Conjunctivae are normal. Pupils are equal, round, and reactive to light.  Neck: Neck supple. No tracheal deviation present. No thyromegaly present.  Cardiovascular: Normal rate and regular rhythm.   No murmur heard. Pulmonary/Chest: Effort normal and breath sounds normal.  Abdominal: Soft. Bowel sounds are normal. He exhibits no distension. There is no tenderness.  Musculoskeletal: Normal range of motion. He exhibits no edema or tenderness.  Entire spine nontender. Pelvis stable nontender. Right upper extremity tender overlying shoulder, no gross deformity. Full range of motion with pain. Radial pulse 2+. All other extremities without contusion abrasion or tenderness neurovascular intact.  Neurological: He is alert and oriented to person, place, and time. No cranial nerve deficit. Coordination normal.  Motor strength 5 over 5 overall  Skin: Skin is warm and dry. No rash noted.  Psychiatric: He has a normal mood and affect.  Nursing note and vitals  reviewed.   ED Course  Procedures (including critical care time) Labs Review Labs Reviewed - No data to display  Imaging Review No results found.   EKG Interpretation None     9:30 PM patient is alert Glasgow Coma Score 15. Ambulates without difficulty. Not lightheaded on standing. X-rays viewed by me Results for orders placed or performed during the hospital encounter of 09/15/14  U/A (may I&O cath if menses)  Result Value Ref Range   Color, Urine YELLOW YELLOW   APPearance CLEAR CLEAR   Specific Gravity, Urine 1.020 1.005 - 1.030   pH 5.5 5.0 - 8.0   Glucose, UA NEGATIVE NEGATIVE mg/dL   Hgb urine dipstick NEGATIVE NEGATIVE  Bilirubin Urine NEGATIVE NEGATIVE   Ketones, ur NEGATIVE NEGATIVE mg/dL   Protein, ur NEGATIVE NEGATIVE mg/dL   Urobilinogen, UA 0.2 0.0 - 1.0 mg/dL   Nitrite NEGATIVE NEGATIVE   Leukocytes, UA MODERATE (A) NEGATIVE  CBC with Differential  Result Value Ref Range   WBC 7.8 4.0 - 10.5 K/uL   RBC 4.34 4.22 - 5.81 MIL/uL   Hemoglobin 13.5 13.0 - 17.0 g/dL   HCT 41.2 39.0 - 52.0 %   MCV 94.9 78.0 - 100.0 fL   MCH 31.1 26.0 - 34.0 pg   MCHC 32.8 30.0 - 36.0 g/dL   RDW 13.6 11.5 - 15.5 %   Platelets 167 150 - 400 K/uL   Neutrophils Relative % 75 43 - 77 %   Neutro Abs 5.8 1.7 - 7.7 K/uL   Lymphocytes Relative 13 12 - 46 %   Lymphs Abs 1.0 0.7 - 4.0 K/uL   Monocytes Relative 11 3 - 12 %   Monocytes Absolute 0.9 0.1 - 1.0 K/uL   Eosinophils Relative 1 0 - 5 %   Eosinophils Absolute 0.1 0.0 - 0.7 K/uL   Basophils Relative 0 0 - 1 %   Basophils Absolute 0.0 0.0 - 0.1 K/uL  Urine microscopic-add on  Result Value Ref Range   WBC, UA 21-50 <3 WBC/hpf   RBC / HPF 0-2 <3 RBC/hpf   Bacteria, UA RARE RARE  I-stat chem 8, ed  Result Value Ref Range   Sodium 138 137 - 147 mEq/L   Potassium 4.0 3.7 - 5.3 mEq/L   Chloride 105 96 - 112 mEq/L   BUN 18 6 - 23 mg/dL   Creatinine, Ser 1.00 0.50 - 1.35 mg/dL   Glucose, Bld 90 70 - 99 mg/dL   Calcium, Ion  1.20 1.13 - 1.30 mmol/L   TCO2 20 0 - 100 mmol/L   Hemoglobin 14.6 13.0 - 17.0 g/dL   HCT 43.0 39.0 - 52.0 %   Dg Shoulder Right  09/21/2014   CLINICAL DATA:  Fall, right shoulder pain  EXAM: RIGHT SHOULDER - 2+ VIEW  COMPARISON:  None.  FINDINGS: No fracture or dislocation is seen.  Degenerative changes of the right acromioclavicular joint.  Visualized right lung is clear.  IMPRESSION: No fracture or dislocation is seen.  Mild degenerative changes.   Electronically Signed   By: Julian Hy M.D.   On: 09/21/2014 20:04   Ct Head Wo Contrast  09/21/2014   CLINICAL DATA:  Initial evaluation for fall.  Headache.  EXAM: CT HEAD WITHOUT CONTRAST  TECHNIQUE: Contiguous axial images were obtained from the base of the skull through the vertex without intravenous contrast.  COMPARISON:  Prior CT from 10/16/2009.  FINDINGS: Diffuse prominence of the CSF containing spaces is compatible with generalized age-related cerebral atrophy. Patchy and confluent hypodensity within the periventricular and deep white matter consistent with chronic small vessel ischemic changes. Remote right MCA territory infarct with encephalomalacia within the right temporoparietal region again seen. Small remote left cerebellar infarct noted is well.  No acute large vessel territory infarct. No intracranial hemorrhage. No extra-axial fluid collection. No mass lesion, midline shift, or mass effect. No hydrocephalus.  Scalp soft tissues within normal limits.  Calvarium intact.  Moderate mucoperiosteal thickening present within the left sphenoid sinus. Paranasal sinuses are otherwise clear. No mastoid effusion. No nasal bone fracture identified.  IMPRESSION: 1. No acute intracranial process identified. 2. 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 3. Atrophy with chronic small vessel ischemic  disease. 4. Moderate left sphenoid sinus disease.   Electronically Signed   By: Jeannine Boga M.D.   On: 09/21/2014 21:10     MDM  Patient not given opioid pain medicine here as he is driving home. He states he cannot take Tylenol. Plan discharged for home. Patient has prescription for Norco at home which she has been taking for several years. Follow-up with PMD if significant pain one week Final diagnoses:  Fall   diagnosis #1 fall #2 minor closed head trauma #3 contusion to right shoulder      Orlie Dakin, MD 09/21/14 2142

## 2014-09-21 NOTE — ED Notes (Signed)
The pt tells me that he thought that the doctor from [facility] was ordering his xrays and that he did not need to  Come here

## 2014-09-21 NOTE — ED Notes (Addendum)
Presents from [place] post fall  Tripped over corn hole board and  landed on dance floor on right side, injury to nose noted, unsure if LOC, c/o right arm and shoulder pain and headache. He is alert and oriented. He took one hydrocodone before coming here.  Pt takes plavix. Shoulder pain worse with right arm movement. +2 radial pulse. No deformity

## 2014-09-24 ENCOUNTER — Telehealth: Payer: Self-pay | Admitting: *Deleted

## 2014-09-24 NOTE — Progress Notes (Signed)
Patient notified of results.

## 2014-09-24 NOTE — Telephone Encounter (Signed)
Called and notified patient of lab results. No changes in therapy at this time. Patient voiced understanding.

## 2014-09-24 NOTE — Telephone Encounter (Signed)
-----   Message from Troy Sine, MD sent at 09/24/2014 10:34 AM EST ----- Labs ok; min inc TG and dec hdl

## 2014-10-12 ENCOUNTER — Other Ambulatory Visit: Payer: Self-pay | Admitting: Cardiovascular Disease

## 2014-10-14 NOTE — Telephone Encounter (Signed)
Rx refill sent to patient pharmacy   

## 2014-11-09 ENCOUNTER — Other Ambulatory Visit: Payer: Self-pay | Admitting: Neurology

## 2014-11-25 ENCOUNTER — Telehealth: Payer: Self-pay | Admitting: Neurology

## 2014-11-25 DIAGNOSIS — Z5181 Encounter for therapeutic drug level monitoring: Secondary | ICD-10-CM

## 2014-11-25 NOTE — Telephone Encounter (Signed)
I called patient. He is to come in at some point in the next week or 2 to have blood work done. He is on CellCept.

## 2014-12-16 ENCOUNTER — Other Ambulatory Visit (INDEPENDENT_AMBULATORY_CARE_PROVIDER_SITE_OTHER): Payer: Self-pay

## 2014-12-16 DIAGNOSIS — Z5181 Encounter for therapeutic drug level monitoring: Secondary | ICD-10-CM

## 2014-12-16 DIAGNOSIS — Z0289 Encounter for other administrative examinations: Secondary | ICD-10-CM

## 2014-12-17 LAB — COMPREHENSIVE METABOLIC PANEL
ALBUMIN: 3.7 g/dL (ref 3.5–4.7)
ALT: 26 IU/L (ref 0–44)
AST: 28 IU/L (ref 0–40)
Albumin/Globulin Ratio: 1.5 (ref 1.1–2.5)
Alkaline Phosphatase: 67 IU/L (ref 39–117)
BUN / CREAT RATIO: 21 (ref 10–22)
BUN: 20 mg/dL (ref 8–27)
Bilirubin Total: 0.4 mg/dL (ref 0.0–1.2)
CHLORIDE: 105 mmol/L (ref 97–108)
CO2: 20 mmol/L (ref 18–29)
Calcium: 9.1 mg/dL (ref 8.6–10.2)
Creatinine, Ser: 0.94 mg/dL (ref 0.76–1.27)
GFR calc Af Amer: 84 mL/min/{1.73_m2} (ref >59)
GFR calc non Af Amer: 73 mL/min/{1.73_m2} (ref >59)
GLOBULIN, TOTAL: 2.5 g/dL (ref 1.5–4.5)
Glucose: 100 mg/dL — ABNORMAL HIGH (ref 65–99)
Potassium: 4.4 mmol/L (ref 3.5–5.2)
SODIUM: 140 mmol/L (ref 134–144)
Total Protein: 6.2 g/dL (ref 6.0–8.5)

## 2014-12-17 LAB — CBC WITH DIFFERENTIAL/PLATELET
BASOS ABS: 0 10*3/uL (ref 0.0–0.2)
Basos: 0 %
EOS: 5 %
Eosinophils Absolute: 0.2 10*3/uL (ref 0.0–0.4)
HEMATOCRIT: 41.5 % (ref 37.5–51.0)
Hemoglobin: 14.1 g/dL (ref 12.6–17.7)
Immature Grans (Abs): 0 10*3/uL (ref 0.0–0.1)
Immature Granulocytes: 0 %
LYMPHS: 37 %
Lymphocytes Absolute: 1.2 10*3/uL (ref 0.7–3.1)
MCH: 31.3 pg (ref 26.6–33.0)
MCHC: 34 g/dL (ref 31.5–35.7)
MCV: 92 fL (ref 79–97)
MONOCYTES: 14 %
Monocytes Absolute: 0.5 10*3/uL (ref 0.1–0.9)
NEUTROS ABS: 1.5 10*3/uL (ref 1.4–7.0)
Neutrophils Relative %: 44 %
Platelets: 192 10*3/uL (ref 150–379)
RBC: 4.51 x10E6/uL (ref 4.14–5.80)
RDW: 14 % (ref 12.3–15.4)
WBC: 3.4 10*3/uL (ref 3.4–10.8)

## 2014-12-17 NOTE — Progress Notes (Signed)
Quick Note:  Spoke to patient and relayed unremarkable blood work results, per Dr. Jannifer Franklin. ______

## 2015-03-06 ENCOUNTER — Ambulatory Visit: Payer: Medicare Other | Admitting: Family Medicine

## 2015-03-10 ENCOUNTER — Encounter: Payer: Self-pay | Admitting: Family Medicine

## 2015-03-10 ENCOUNTER — Ambulatory Visit (INDEPENDENT_AMBULATORY_CARE_PROVIDER_SITE_OTHER): Payer: Medicare Other | Admitting: Family Medicine

## 2015-03-10 VITALS — BP 130/70 | HR 83 | Temp 97.8°F | Wt 257.2 lb

## 2015-03-10 DIAGNOSIS — E538 Deficiency of other specified B group vitamins: Secondary | ICD-10-CM

## 2015-03-10 DIAGNOSIS — E559 Vitamin D deficiency, unspecified: Secondary | ICD-10-CM

## 2015-03-10 DIAGNOSIS — I10 Essential (primary) hypertension: Secondary | ICD-10-CM | POA: Diagnosis not present

## 2015-03-10 DIAGNOSIS — R5382 Chronic fatigue, unspecified: Secondary | ICD-10-CM

## 2015-03-10 DIAGNOSIS — G7000 Myasthenia gravis without (acute) exacerbation: Secondary | ICD-10-CM

## 2015-03-10 LAB — VITAMIN D 25 HYDROXY (VIT D DEFICIENCY, FRACTURES): VITD: 22.84 ng/mL — AB (ref 30.00–100.00)

## 2015-03-10 LAB — VITAMIN B12: Vitamin B-12: >1500 pg/mL — ABNORMAL HIGH (ref 211–911)

## 2015-03-10 MED ORDER — HYDROCODONE-ACETAMINOPHEN 5-325 MG PO TABS: 1.0000 | ORAL_TABLET | Freq: Four times a day (QID) | ORAL | Status: DC | PRN

## 2015-03-10 MED ORDER — CYANOCOBALAMIN 1000 MCG/ML IJ SOLN
1000.0000 ug | Freq: Once | INTRAMUSCULAR | Status: AC
  Administered 2015-03-10: 1000 ug via INTRAMUSCULAR

## 2015-03-10 NOTE — Progress Notes (Signed)
Pre visit review using our clinic review tool, if applicable. No additional management support is needed unless otherwise documented below in the visit note. 

## 2015-03-10 NOTE — Addendum Note (Signed)
Addended by: Alysia Penna A on: 03/10/2015 11:47 AM   Modules accepted: Orders

## 2015-03-10 NOTE — Progress Notes (Signed)
   Subjective:    Patient ID: [name], male    DOB: [birth year], 81 y.o.   MRN: [id number]  HPI Here to discuss mild chronic fatigue. He has myasthenia gravis but this has been stable, and Dr. Jannifer Franklin seems to be quite satisfied with his progress taking Cellcept. He has no other specific complaints today. He asks if his B12 level could be low (he has a hx of this in the past). His level has not been checked in several years, although he has had extensive lab work in the past few months.    Review of Systems  Constitutional: Positive for fatigue.  Respiratory: Negative.   Cardiovascular: Negative.   Neurological: Negative.        Objective:   Physical Exam  Constitutional: He is oriented to person, place, and time. He appears well-developed and well-nourished.  Cardiovascular: Normal rate, regular rhythm, normal heart sounds and intact distal pulses.   Pulmonary/Chest: Effort normal and breath sounds normal.  Neurological: He is alert and oriented to person, place, and time.          Assessment & Plan:  We will check levels for vitamins D and B12 today. Given a B12 shot.

## 2015-03-11 ENCOUNTER — Other Ambulatory Visit: Payer: Self-pay

## 2015-03-11 MED ORDER — VITAMIN D (ERGOCALCIFEROL) 1.25 MG (50000 UNIT) PO CAPS: 50000.0000 [IU] | ORAL_CAPSULE | ORAL | Status: DC

## 2015-05-11 ENCOUNTER — Encounter (HOSPITAL_COMMUNITY): Payer: Self-pay | Admitting: *Deleted

## 2015-05-11 ENCOUNTER — Emergency Department (HOSPITAL_COMMUNITY)
Admission: EM | Admit: 2015-05-11 | Discharge: 2015-05-11 | Disposition: A | Discharge status: Home / Self Care | Payer: Medicare Other | Attending: Emergency Medicine | Admitting: Emergency Medicine

## 2015-05-11 DIAGNOSIS — Z7902 Long term (current) use of antithrombotics/antiplatelets: Secondary | ICD-10-CM | POA: Insufficient documentation

## 2015-05-11 DIAGNOSIS — I129 Hypertensive chronic kidney disease with stage 1 through stage 4 chronic kidney disease, or unspecified chronic kidney disease: Secondary | ICD-10-CM | POA: Diagnosis not present

## 2015-05-11 DIAGNOSIS — H9201 Otalgia, right ear: Secondary | ICD-10-CM | POA: Diagnosis not present

## 2015-05-11 DIAGNOSIS — Z79899 Other long term (current) drug therapy: Secondary | ICD-10-CM | POA: Insufficient documentation

## 2015-05-11 DIAGNOSIS — K219 Gastro-esophageal reflux disease without esophagitis: Secondary | ICD-10-CM | POA: Diagnosis not present

## 2015-05-11 DIAGNOSIS — N189 Chronic kidney disease, unspecified: Secondary | ICD-10-CM | POA: Diagnosis not present

## 2015-05-11 DIAGNOSIS — Z85528 Personal history of other malignant neoplasm of kidney: Secondary | ICD-10-CM | POA: Insufficient documentation

## 2015-05-11 DIAGNOSIS — E669 Obesity, unspecified: Secondary | ICD-10-CM | POA: Diagnosis not present

## 2015-05-11 DIAGNOSIS — I2510 Atherosclerotic heart disease of native coronary artery without angina pectoris: Secondary | ICD-10-CM | POA: Diagnosis not present

## 2015-05-11 DIAGNOSIS — Z8669 Personal history of other diseases of the nervous system and sense organs: Secondary | ICD-10-CM | POA: Diagnosis not present

## 2015-05-11 DIAGNOSIS — Z9889 Other specified postprocedural states: Secondary | ICD-10-CM | POA: Diagnosis not present

## 2015-05-11 DIAGNOSIS — M158 Other polyosteoarthritis: Secondary | ICD-10-CM | POA: Diagnosis not present

## 2015-05-11 DIAGNOSIS — Z87891 Personal history of nicotine dependence: Secondary | ICD-10-CM | POA: Diagnosis not present

## 2015-05-11 DIAGNOSIS — Z9861 Coronary angioplasty status: Secondary | ICD-10-CM | POA: Insufficient documentation

## 2015-05-11 NOTE — ED Provider Notes (Signed)
CSN: [id number]     Arrival date & time 05/11/15  0918 History   First MD Initiated Contact with Patient 05/11/15 [phone]     Chief Complaint  Patient presents with  . Otalgia  . Facial Pain     (Consider location/radiation/quality/duration/timing/severity/associated sxs/prior Treatment) Patient is a 81 y.o. male presenting with ear pain. The history is provided by the patient.  Otalgia Associated symptoms: no abdominal pain, no congestion, no ear discharge, no fever, no headaches, no neck pain, no rash and no tinnitus    patient with 2 day history of right ear pain that got worse today radiates to his right jaw. Pain seems to be coming from the ear not from the jaw area. A little worse during the night became more constant. Patient states he has a history of chronic sinus issues and feels that that's aggravating his ear it is aggravated when he blows his nose. No fevers. Denies any foreign body placement into the right ear. No problem with the left ear.  Past Medical History  Diagnosis Date  . Coronary artery disease     sees Dr. Shelva Majestic   . Diverticulitis   . GERD (gastroesophageal reflux disease)   . Benign prostatic hypertrophy   . Partial small bowel obstruction 04/16/10    resolved with bowel rest  . Hypertension   . Shortness of breath   . Pulmonary embolus     2000  . Arthritis     back, hips, knees   . Cancer     h/o renal carcinoma   . Chronic kidney disease     h/o UTI-2012  . Obesity     exogenous  . Myasthenia gravis     sees Dr. Jannifer Franklin  . Osteoporosis   . Heart disease   . H/O epistaxis   . Urinary incontinence    Past Surgical History  Procedure Laterality Date  . Total knee arthroplasty      both Sees Dr. Novella Olive  . Nephrectomy      left  . Angioplasty      had 2 stent replacements  . Stented placed  1/08  . Colonoscopy  05/28/10    per Dr. Laurence Spates, diverticulosis, no repeats planned   . Sp kyphoplasty      and vertebroplasty  . Cardiac  catheterization      2008, stents   . Eye surgery      cataracts(bilateral)  removed, ?iol  . Tonsillectomy    . Joint replacement      2005-R, L knee replacement- 2000  . Total knee revision  03/28/2012    Procedure: TOTAL KNEE REVISION;  Surgeon: Hessie Dibble, MD;  Location: [facility];  Service: Orthopedics;  Laterality: Right;  . Cardiac catheterization  2008  . Nm myoview ltd  02/2011    moderate inferior scar no ischemia  . Doppler echocardiography    . Stress dipyridamole myocardial perfusion     Family History  Problem Relation Age of Onset  . Colon cancer Mother    History  Substance Use Topics  . Smoking status: Former Smoker    Types: Cigarettes    Quit date: 03/22/1959  . Smokeless tobacco: Never Used     Comment: quit 1970s  . Alcohol Use: No    Review of Systems  Constitutional: Negative for fever.  HENT: Positive for ear pain and sinus pressure. Negative for congestion, ear discharge, facial swelling, tinnitus and trouble swallowing.   Eyes: Negative for visual  disturbance.  Respiratory: Negative for shortness of breath.   Cardiovascular: Negative for chest pain.  Gastrointestinal: Negative for abdominal pain.  Genitourinary: Negative for dysuria.  Musculoskeletal: Negative for neck pain.  Skin: Negative for rash.  Neurological: Negative for headaches.  Hematological: Does not bruise/bleed easily.  Psychiatric/Behavioral: Negative for confusion.      Allergies  Dicyclomine  Home Medications   Prior to Admission medications   Medication Sig Start Date End Date Taking? Authorizing Provider  clopidogrel (PLAVIX) 75 MG tablet TAKE 1 TABLET EACH DAY. 10/14/14  Yes Troy Sine, MD  CRESTOR 10 MG tablet TAKE 1 TABLET ONCE A DAY AFTER BREAKFAST. 09/11/14  Yes Troy Sine, MD  HYDROcodone-acetaminophen (NORCO/VICODIN) 5-325 MG per tablet Take 1 tablet by mouth every 6 (six) hours as needed for moderate pain. 03/10/15  Yes [name], MD  ketoconazole  (NIZORAL) 2 % cream Apply 1 application topically daily. Patient taking differently: Apply 1 application topically daily as needed for irritation.  02/23/14  Yes Hollace Kinnier Sofia, PA-C  metoprolol succinate (TOPROL-XL) 50 MG 24 hr tablet TAKE 1 TABLET ONCE DAILY. 10/14/14  Yes Troy Sine, MD  Multiple Vitamin (MULTIVITAMIN WITH MINERALS) TABS Take 1 tablet by mouth daily.   Yes Historical Provider, MD  mycophenolate (CELLCEPT) 500 MG tablet TAKE 1 TABLET EVERY MORNING AND 2 TABLETS AT BEDTIME. 09/11/14  Yes Kathrynn Ducking, MD  nystatin-triamcinolone [facility] II) cream Apply to affected area 3 times daily Patient taking differently: Apply 1 application topically 3 (three) times daily.  02/21/14  Yes Freeman Caldron Baker, PA-C  pyridostigmine (MESTINON) 60 MG tablet TAKE  (1)  TABLET  FOUR TIMES DAILY. 11/10/14  Yes Kathrynn Ducking, MD  Vitamin D, Ergocalciferol, (DRISDOL) 50000 UNITS CAPS capsule Take 1 capsule (50,000 Units total) by mouth every 7 (seven) days. For 12 weeks 03/11/15  Yes [name], MD  pyridostigmine (MESTINON) 60 MG tablet Take 60 mg by mouth 3 (three) times daily.    Historical Provider, MD  sulfamethoxazole-trimethoprim (BACTRIM DS,SEPTRA DS) 800-160 MG per tablet Take 1 tablet by mouth 2 (two) times daily. Patient not taking: Reported on 03/10/2015 09/15/14   Davonna Belling, MD   BP 141/68 mmHg  Pulse 87  Temp(Src) 97.4 F (36.3 C) (Oral)  Resp 18  SpO2 93% Physical Exam  Constitutional: He is oriented to person, place, and time. He appears well-developed and well-nourished. No distress.  HENT:  Head: Normocephalic and atraumatic.  Left Ear: External ear normal.  Mouth/Throat: Oropharynx is clear and moist.  Right ear canal without any sniffing swelling. Fair amount of cerumen present. Not able to completely visualized panic membrane. Some portion of the lower aspect spanning membrane seems to be very white could represent scar or could perhaps be foreign body.  Eyes:  Conjunctivae and EOM are normal. Pupils are equal, round, and reactive to light.  Neck: Normal range of motion. Neck supple.  Cardiovascular: Normal rate, regular rhythm and normal heart sounds.   No murmur heard. Pulmonary/Chest: Effort normal and breath sounds normal. No respiratory distress.  Abdominal: Soft. Bowel sounds are normal. There is no tenderness.  Musculoskeletal: Normal range of motion.  Neurological: He is alert and oriented to person, place, and time. No cranial nerve deficit. He exhibits normal muscle tone. Coordination normal.  Skin: Skin is warm. No rash noted. No erythema.  No facial rash prickly no rash or erythema around the right  Nursing note and vitals reviewed.   ED Course  Procedures (including critical care time) Labs Review Labs Reviewed - No data to display  Imaging Review No results found.   EKG Interpretation None      MDM   Final diagnoses:  Ear pain, right    The patient's left ear is normal. Right ear canal has a fair amount of cerumen also there is evidence of a whitish possibly foreign body patient denies putting cotton in the year also could be a significant white scar on the tympanic membrane. No significant swelling to the right ear canal. Will treat symptomatically no evidence of infection we'll refer to ear nose and throat for further evaluation.  Patient symptoms always could be the development of a herpes zoster's pattern that is not showing any evidence of rash at this time. The pain distribution makes this a possibility.  Fredia Sorrow, MD 05/11/15 1105

## 2015-05-11 NOTE — Discharge Instructions (Signed)
Take the hydrocodone that you have at home as needed for pain. Make an appointment to follow-up with ear nose and throat: Her office tomorrow. May have a foreign body in the right ear canal or may have significant panic membrane scarring. Also have a significant amount of wax in that ear canal.

## 2015-05-11 NOTE — ED Notes (Signed)
Pt presents to ED with c/o right ear pain that at times radiates to his jaw. Pt sts pain has been constant since last night. Pt reports chronic sinus issues and sts it aggravates his ear when blowing his nose.

## 2015-05-14 ENCOUNTER — Other Ambulatory Visit: Payer: Self-pay | Admitting: Neurology

## 2015-06-13 ENCOUNTER — Other Ambulatory Visit: Payer: Self-pay | Admitting: Neurology

## 2015-06-18 ENCOUNTER — Telehealth: Payer: Self-pay | Admitting: Neurology

## 2015-06-18 NOTE — Telephone Encounter (Signed)
Patient called to make a f/u appt with Dr Jannifer Franklin. He states Dr Jannifer Franklin told him if he started to experience droopy eye lids and worsening of gait to call. Please call to work him in. Patient can be reached at [phone].

## 2015-06-18 NOTE — Telephone Encounter (Signed)
I called the patient. He states that for a few weeks it has been hard to open his eyes in the morning and he notices them being droopy. He also feels off balance. He denies any weakness/dizziness. Appointment scheduled for tomorrow (9/15) at 8 AM.

## 2015-06-19 ENCOUNTER — Ambulatory Visit (INDEPENDENT_AMBULATORY_CARE_PROVIDER_SITE_OTHER): Payer: Medicare Other | Admitting: Neurology

## 2015-06-19 ENCOUNTER — Encounter: Payer: Self-pay | Admitting: Neurology

## 2015-06-19 VITALS — BP 135/73 | HR 78 | Ht 69.0 in | Wt 259.0 lb

## 2015-06-19 DIAGNOSIS — R252 Cramp and spasm: Secondary | ICD-10-CM

## 2015-06-19 DIAGNOSIS — Z5181 Encounter for therapeutic drug level monitoring: Secondary | ICD-10-CM

## 2015-06-19 DIAGNOSIS — G7000 Myasthenia gravis without (acute) exacerbation: Secondary | ICD-10-CM

## 2015-06-19 NOTE — Progress Notes (Signed)
Reason for visit: Myasthenia gravis  [name] is an 81 y.o. male  History of present illness:  [name] is an 81 year old right-handed white male with a history of myasthenia gravis. The patient indicates that he has had some recent issues with opening his eyes in the morning. He may have difficulty with one or both eyes. He indicates that once he gets the eyes open, he will not have any issues with eye opening and closure throughout the day. He does have occasional double vision, but not out of the ordinary for his usual baseline. He indicates that his ophthalmologist gave him a new eyedrop that is somewhat thick and sticky, he believes that maybe his eyelids are stuck together in the morning time. The patient also is having some episodes of leg cramps that may occur more on the right leg than the left, in the thigh or the gastrocnemius muscle. This occurs generally at nighttime, not as much during the day. He may have one or 2 episodes a week. This has been going on for a couple of months. The patient is on Crestor, but he also takes his Mestinon 60 mg 4 times daily, the last dose is right before bedtime. The patient denies any falls, he does have some knee discomfort on the right, he has had knee surgery previously. He will be seeing his orthopedic surgeon for this. He denies any back pain or pain down the leg. He returns to this office for an evaluation. He last had blood work done 3 months ago.  Past Medical History  Diagnosis Date  . Coronary artery disease     sees Dr. Shelva Majestic   . Diverticulitis   . GERD (gastroesophageal reflux disease)   . Benign prostatic hypertrophy   . Partial small bowel obstruction 04/16/10    resolved with bowel rest  . Hypertension   . Shortness of breath   . Pulmonary embolus     2000  . Arthritis     back, hips, knees   . Cancer     h/o renal carcinoma   . Chronic kidney disease     h/o UTI-2012  . Obesity     exogenous  . Myasthenia  gravis     sees Dr. Jannifer Franklin  . Osteoporosis   . Heart disease   . H/O epistaxis   . Urinary incontinence   . UTI (urinary tract infection)     Past Surgical History  Procedure Laterality Date  . Total knee arthroplasty      both Sees Dr. Novella Olive  . Nephrectomy      left  . Angioplasty      had 2 stent replacements  . Stented placed  1/08  . Colonoscopy  05/28/10    per Dr. Laurence Spates, diverticulosis, no repeats planned   . Sp kyphoplasty      and vertebroplasty  . Cardiac catheterization      2008, stents   . Eye surgery      cataracts(bilateral)  removed, ?iol  . Tonsillectomy    . Joint replacement      2005-R, L knee replacement- 2000  . Total knee revision  03/28/2012    Procedure: TOTAL KNEE REVISION;  Surgeon: Hessie Dibble, MD;  Location: [facility];  Service: Orthopedics;  Laterality: Right;  . Cardiac catheterization  2008  . Nm myoview ltd  02/2011    moderate inferior scar no ischemia  . Doppler echocardiography    . Stress  dipyridamole myocardial perfusion      Family History  Problem Relation Age of Onset  . Colon cancer Mother     Social history:  reports that he quit smoking about 56 years ago. His smoking use included Cigarettes. He has never used smokeless tobacco. He reports that he does not drink alcohol or use illicit drugs.    Allergies  Allergen Reactions  . Dicyclomine Other (See Comments)    REACTION: can worsen myasthenia gravis    Medications:  Prior to Admission medications   Medication Sig Start Date End Date Taking? Authorizing Provider  ciprofloxacin (CIPRO) 250 MG tablet Take 250 mg by mouth daily.   Yes Historical Provider, MD  clopidogrel (PLAVIX) 75 MG tablet TAKE 1 TABLET EACH DAY. 10/14/14  Yes Troy Sine, MD  CRESTOR 10 MG tablet TAKE 1 TABLET ONCE A DAY AFTER BREAKFAST. 09/11/14  Yes Troy Sine, MD  HYDROcodone-acetaminophen (NORCO/VICODIN) 5-325 MG per tablet Take 1 tablet by mouth every 6 (six) hours as needed for  moderate pain. 03/10/15  Yes [name], MD  metoprolol succinate (TOPROL-XL) 50 MG 24 hr tablet TAKE 1 TABLET ONCE DAILY. 10/14/14  Yes Troy Sine, MD  Multiple Vitamin (MULTIVITAMIN WITH MINERALS) TABS Take 1 tablet by mouth daily.   Yes Historical Provider, MD  mycophenolate (CELLCEPT) 500 MG tablet TAKE 1 TABLET EVERY MORNING AND 2 TABLETS AT BEDTIME. 06/13/15  Yes Kathrynn Ducking, MD  pyridostigmine (MESTINON) 60 MG tablet TAKE  (1)  TABLET  FOUR TIMES DAILY. 06/13/15  Yes Kathrynn Ducking, MD  sulfamethoxazole-trimethoprim (BACTRIM DS,SEPTRA DS) 800-160 MG per tablet Take 1 tablet by mouth 2 (two) times daily. 09/15/14  Yes Davonna Belling, MD  Vitamin D, Ergocalciferol, (DRISDOL) 50000 UNITS CAPS capsule Take 1 capsule (50,000 Units total) by mouth every 7 (seven) days. For 12 weeks 03/11/15  Yes [name], MD    ROS:  Out of a complete 14 system review of symptoms, the patient complains only of the following symptoms, and all other reviewed systems are negative.  Leg cramps  Blood pressure 135/73, pulse 78, height 5\' 9"  (1.753 m), weight 259 lb (117.482 kg).  Physical Exam  General: The patient is alert and cooperative at the time of the examination. The patient is markedly obese.  Skin: No significant peripheral edema is noted.   Neurologic Exam  Mental status: The patient is alert and oriented x 3 at the time of the examination. The patient has apparent normal recent and remote memory, with an apparently normal attention span and concentration ability.   Cranial nerves: Facial symmetry is present. Speech is normal, no aphasia or dysarthria is noted. Extraocular movements are full. Visual fields are full. No facial muscle weakness is noted. With superior gaze for 1 minute, the patient reports some subjective double vision at 40 seconds. No ptosis is seen.  Motor: The patient has good strength in all 4 extremities. With arms outstretched for 1 minute, no fatigable weakness  of the deltoid muscles is noted.  Sensory examination: Soft touch sensation is symmetric on the face, arms, and legs.  Coordination: The patient has good finger-nose-finger and heel-to-shin bilaterally.  Gait and station: The patient has a normal gait. Tandem gait is not tested. Romberg is negative. No drift is seen.  Reflexes: Deep tendon reflexes are symmetric.   Assessment/Plan:  1. Myasthenia gravis  2. Nocturnal leg cramps  The patient is having some issues with the eyelids sticking together, possibly related  to his eyedrops. I do not think that the myasthenia gravis is causing this. The patient also reports nocturnal leg cramps, but he takes his nighttime dose of Mestinon right before bedtime. The patient will eliminate this dose. If the cramps continue he is to contact me. The patient is also on Crestor which may promote leg cramps. The patient will have blood work done today, he will follow-up in 6 months.  Jill Alexanders MD 06/19/2015 3:49 PM  [facility] [address] [place], [place] [address]  Phone [phone] Fax [phone]

## 2015-06-19 NOTE — Patient Instructions (Addendum)
   To avoid the cramps in the legs at night, stop the bedtime dose of Mestinon (pyridostigmine).  Leg Cramps Leg cramps that occur during exercise can be caused by poor circulation or dehydration. However, muscle cramps that occur at rest or during the night are usually not due to any serious medical problem. Heat cramps may cause muscle spasms during hot weather.  CAUSES There is no clear cause for muscle cramps. However, dehydration may be a factor for those who do not drink enough fluids and those who exercise in the heat. Imbalances in the level of sodium, potassium, calcium or magnesium in the muscle tissue may also be a factor. Some medications, such as water pills (diuretics), may cause loss of chemicals that the body needs (like sodium and potassium) and cause muscle cramps. TREATMENT   Make sure your diet has enough fluids and essential minerals for the muscle to work normally.  Avoid strenuous exercise for several days if you have been having frequent leg cramps.  Stretch and massage the cramped muscle for several minutes.  Some medicines may be helpful in some patients with night cramps. Only take over-the-counter or prescription medicines as directed by your caregiver. SEEK IMMEDIATE MEDICAL CARE IF:   Your leg cramps become worse.  Your foot becomes cold, numb, or blue. Document Released: 10/28/2004 Document Revised: 12/13/2011 Document Reviewed: 10/15/2008 [facility] Patient Information 2015 [place], [place]. This information is not intended to replace advice given to you by your health care provider. Make sure you discuss any questions you have with your health care provider.

## 2015-06-20 ENCOUNTER — Other Ambulatory Visit: Payer: Self-pay | Admitting: Family Medicine

## 2015-06-20 ENCOUNTER — Telehealth: Payer: Self-pay | Admitting: Family Medicine

## 2015-06-20 ENCOUNTER — Telehealth: Payer: Self-pay

## 2015-06-20 DIAGNOSIS — E559 Vitamin D deficiency, unspecified: Secondary | ICD-10-CM

## 2015-06-20 LAB — COMPREHENSIVE METABOLIC PANEL
ALBUMIN: 3.9 g/dL (ref 3.5–4.7)
ALT: 23 IU/L (ref 0–44)
AST: 22 IU/L (ref 0–40)
Albumin/Globulin Ratio: 1.4 (ref 1.1–2.5)
Alkaline Phosphatase: 55 IU/L (ref 39–117)
BUN/Creatinine Ratio: 21 (ref 10–22)
BUN: 22 mg/dL (ref 8–27)
Bilirubin Total: 0.5 mg/dL (ref 0.0–1.2)
CALCIUM: 9.2 mg/dL (ref 8.6–10.2)
CO2: 24 mmol/L (ref 18–29)
CREATININE: 1.05 mg/dL (ref 0.76–1.27)
Chloride: 105 mmol/L (ref 97–108)
GFR calc Af Amer: 73 mL/min/{1.73_m2} (ref >59)
GFR, EST NON AFRICAN AMERICAN: 64 mL/min/{1.73_m2} (ref >59)
GLOBULIN, TOTAL: 2.7 g/dL (ref 1.5–4.5)
Glucose: 104 mg/dL — ABNORMAL HIGH (ref 65–99)
Potassium: 4.4 mmol/L (ref 3.5–5.2)
SODIUM: 142 mmol/L (ref 134–144)
Total Protein: 6.6 g/dL (ref 6.0–8.5)

## 2015-06-20 LAB — CBC WITH DIFFERENTIAL/PLATELET
BASOS: 0 %
Basophils Absolute: 0 10*3/uL (ref 0.0–0.2)
EOS (ABSOLUTE): 0.2 10*3/uL (ref 0.0–0.4)
EOS: 5 %
HEMATOCRIT: 42.1 % (ref 37.5–51.0)
Hemoglobin: 13.9 g/dL (ref 12.6–17.7)
IMMATURE GRANULOCYTES: 0 %
Immature Grans (Abs): 0 10*3/uL (ref 0.0–0.1)
LYMPHS ABS: 1.1 10*3/uL (ref 0.7–3.1)
Lymphs: 34 %
MCH: 31.4 pg (ref 26.6–33.0)
MCHC: 33 g/dL (ref 31.5–35.7)
MCV: 95 fL (ref 79–97)
MONOS ABS: 0.6 10*3/uL (ref 0.1–0.9)
Monocytes: 17 %
Neutrophils Absolute: 1.4 10*3/uL (ref 1.4–7.0)
Neutrophils: 44 %
Platelets: 164 10*3/uL (ref 150–379)
RBC: 4.42 x10E6/uL (ref 4.14–5.80)
RDW: 14.3 % (ref 12.3–15.4)
WBC: 3.3 10*3/uL — ABNORMAL LOW (ref 3.4–10.8)

## 2015-06-20 NOTE — Telephone Encounter (Signed)
Spoke to patient. Gave lab results. Patient verbalized understanding.  

## 2015-06-20 NOTE — Telephone Encounter (Signed)
Pt call to say he was told to call back for a blood test once he finish the following medicine Vitamin D, Ergocalciferol, (DRISDOL) 50000 UNITS CAPS capsule    Need to have a lab order placed in the system

## 2015-06-20 NOTE — Telephone Encounter (Signed)
-----   Message from Kathrynn Ducking, MD sent at 06/20/2015  9:01 AM EDT -----  The blood work results are unremarkable, with exception of a minimally low WBC, relatively stable since last check, will recheck bloodwork in 3 months. Please call the patient. ----- Message -----    From: [facility] Lab Results In Interface    Sent: 06/20/2015   5:40 AM      To: Kathrynn Ducking, MD

## 2015-06-20 NOTE — Telephone Encounter (Signed)
Pt has been schedule.

## 2015-06-20 NOTE — Telephone Encounter (Signed)
Future lab order is in computer, can you call pt to schedule the lab draw?

## 2015-06-23 ENCOUNTER — Other Ambulatory Visit (INDEPENDENT_AMBULATORY_CARE_PROVIDER_SITE_OTHER): Payer: Medicare Other

## 2015-06-23 ENCOUNTER — Telehealth: Payer: Self-pay | Admitting: Family Medicine

## 2015-06-23 DIAGNOSIS — E559 Vitamin D deficiency, unspecified: Secondary | ICD-10-CM

## 2015-06-23 LAB — VITAMIN D 25 HYDROXY (VIT D DEFICIENCY, FRACTURES): VITD: 33.98 ng/mL (ref 30.00–100.00)

## 2015-06-23 NOTE — Telephone Encounter (Signed)
Patient wants to speak about his lab results from a previous date.  He couldn't tell me what dr or date, all he could tell me was that he was told his white blood cell count was low, from what he said it was another lab.

## 2015-06-26 NOTE — Telephone Encounter (Signed)
I left a voice message for pt to return my call.  

## 2015-06-27 NOTE — Telephone Encounter (Signed)
I spoke with pt and went over results. 

## 2015-06-27 NOTE — Addendum Note (Signed)
Addended by: Aggie Hacker A on: 06/27/2015 10:59 AM   Modules accepted: Orders, Medications

## 2015-07-17 ENCOUNTER — Other Ambulatory Visit: Payer: Self-pay | Admitting: Neurology

## 2015-08-18 ENCOUNTER — Ambulatory Visit (INDEPENDENT_AMBULATORY_CARE_PROVIDER_SITE_OTHER): Payer: Medicare Other | Admitting: Family Medicine

## 2015-08-18 DIAGNOSIS — Z23 Encounter for immunization: Secondary | ICD-10-CM

## 2015-08-23 ENCOUNTER — Other Ambulatory Visit: Payer: Self-pay | Admitting: Neurology

## 2015-08-25 ENCOUNTER — Telehealth: Payer: Self-pay | Admitting: Neurology

## 2015-08-25 NOTE — Telephone Encounter (Signed)
I called the patient. He stated the pharmacy told him that we sent a note with his last Rx asking for him to schedule an appointment. He was just here 06-19-15. I advised that his current appointment for March will be fine unless he has any new symptoms between now and then. He stated he would call us if he needs [place].

## 2015-08-25 NOTE — Telephone Encounter (Signed)
Patient called to schedule appointment. Patient has already scheduled appointment 12/17/15 with Dr. Jannifer Franklin (next available appointment with Dr. Jannifer Franklin is in March).

## 2015-09-18 ENCOUNTER — Telehealth: Payer: Self-pay | Admitting: Neurology

## 2015-09-18 ENCOUNTER — Other Ambulatory Visit (INDEPENDENT_AMBULATORY_CARE_PROVIDER_SITE_OTHER): Payer: Self-pay

## 2015-09-18 DIAGNOSIS — Z5181 Encounter for therapeutic drug level monitoring: Secondary | ICD-10-CM

## 2015-09-18 DIAGNOSIS — Z0289 Encounter for other administrative examinations: Secondary | ICD-10-CM

## 2015-09-18 NOTE — Telephone Encounter (Signed)
LVM for pt to call back. Please let pt know he needs to come in for labwork at his convenience. Best hours to come are 8-12pm and 1-4pm. Gave [facility] phone number.

## 2015-09-18 NOTE — Telephone Encounter (Signed)
Attempted called the patient, he is to come in for blood work while on CellCept. We will call later. I will put in orders.

## 2015-09-18 NOTE — Telephone Encounter (Signed)
I called the patient. I advised that he needs to come in for blood work at his convenience. I advised not to come between 11:30 and 1. He stated he would try to come this afternoon.

## 2015-09-18 NOTE — Telephone Encounter (Signed)
Patient returned Dr. Tobey Grim call

## 2015-09-19 LAB — COMPREHENSIVE METABOLIC PANEL
A/G RATIO: 1.5 (ref 1.1–2.5)
ALT: 21 IU/L (ref 0–44)
AST: 22 IU/L (ref 0–40)
Albumin: 3.9 g/dL (ref 3.5–4.7)
Alkaline Phosphatase: 55 IU/L (ref 39–117)
BILIRUBIN TOTAL: 0.5 mg/dL (ref 0.0–1.2)
BUN/Creatinine Ratio: 21 (ref 10–22)
BUN: 22 mg/dL (ref 8–27)
CHLORIDE: 105 mmol/L (ref 96–106)
CO2: 22 mmol/L (ref 18–29)
Calcium: 9 mg/dL (ref 8.6–10.2)
Creatinine, Ser: 1.04 mg/dL (ref 0.76–1.27)
GFR calc non Af Amer: 64 mL/min/{1.73_m2} (ref >59)
GFR, EST AFRICAN AMERICAN: 74 mL/min/{1.73_m2} (ref >59)
GLOBULIN, TOTAL: 2.6 g/dL (ref 1.5–4.5)
GLUCOSE: 103 mg/dL — AB (ref 65–99)
POTASSIUM: 4.3 mmol/L (ref 3.5–5.2)
SODIUM: 140 mmol/L (ref 134–144)
Total Protein: 6.5 g/dL (ref 6.0–8.5)

## 2015-09-19 LAB — CBC WITH DIFFERENTIAL/PLATELET
BASOS ABS: 0 10*3/uL (ref 0.0–0.2)
BASOS: 0 %
EOS (ABSOLUTE): 0.2 10*3/uL (ref 0.0–0.4)
Eos: 4 %
Hematocrit: 41.4 % (ref 37.5–51.0)
Hemoglobin: 13.9 g/dL (ref 12.6–17.7)
IMMATURE GRANS (ABS): 0 10*3/uL (ref 0.0–0.1)
Immature Granulocytes: 0 %
LYMPHS: 32 %
Lymphocytes Absolute: 1.2 10*3/uL (ref 0.7–3.1)
MCH: 31.4 pg (ref 26.6–33.0)
MCHC: 33.6 g/dL (ref 31.5–35.7)
MCV: 94 fL (ref 79–97)
Monocytes Absolute: 0.5 10*3/uL (ref 0.1–0.9)
Monocytes: 14 %
Neutrophils Absolute: 1.9 10*3/uL (ref 1.4–7.0)
Neutrophils: 50 %
PLATELETS: 165 10*3/uL (ref 150–379)
RBC: 4.43 x10E6/uL (ref 4.14–5.80)
RDW: 14 % (ref 12.3–15.4)
WBC: 3.8 10*3/uL (ref 3.4–10.8)

## 2015-09-22 ENCOUNTER — Telehealth: Payer: Self-pay

## 2015-09-22 NOTE — Telephone Encounter (Signed)
-----   Message from Kathrynn Ducking, MD sent at 09/19/2015  7:28 AM EST -----  The blood work results are unremarkable. Please call the patient.  ----- Message -----    From: [facility] Lab Results In Interface    Sent: 09/19/2015   5:41 AM      To: Kathrynn Ducking, MD

## 2015-09-22 NOTE — Telephone Encounter (Signed)
Called patient. Gave lab results. Patient verbalized understanding.  

## 2015-09-23 ENCOUNTER — Other Ambulatory Visit: Payer: Self-pay | Admitting: Cardiovascular Disease

## 2015-09-23 NOTE — Telephone Encounter (Signed)
REFILL 

## 2015-09-25 ENCOUNTER — Telehealth: Payer: Self-pay | Admitting: Cardiovascular Disease

## 2015-09-26 NOTE — Telephone Encounter (Signed)
Close encounter 

## 2015-10-04 ENCOUNTER — Encounter (HOSPITAL_COMMUNITY): Payer: Self-pay | Admitting: Emergency Medicine

## 2015-10-04 ENCOUNTER — Emergency Department (INDEPENDENT_AMBULATORY_CARE_PROVIDER_SITE_OTHER)
Admission: EM | Admit: 2015-10-04 | Discharge: 2015-10-04 | Disposition: A | Discharge status: Home / Self Care | Payer: Medicare Other | Source: Home / Self Care

## 2015-10-04 DIAGNOSIS — J069 Acute upper respiratory infection, unspecified: Secondary | ICD-10-CM | POA: Diagnosis not present

## 2015-10-04 MED ORDER — SULFAMETHOXAZOLE-TRIMETHOPRIM 800-160 MG PO TABS: 1.0000 | ORAL_TABLET | Freq: Two times a day (BID) | ORAL | Status: AC

## 2015-10-04 NOTE — Discharge Instructions (Signed)
Upper Respiratory Infection, Adult Most upper respiratory infections (URIs) are a viral infection of the air passages leading to the lungs. A URI affects the nose, throat, and upper air passages. The most common type of URI is nasopharyngitis and is typically referred to as "the common cold." URIs run their course and usually go away on their own. Most of the time, a URI does not require medical attention, but sometimes a bacterial infection in the upper airways can follow a viral infection. This is called a secondary infection. Sinus and middle ear infections are common types of secondary upper respiratory infections. Bacterial pneumonia can also complicate a URI. A URI can worsen asthma and chronic obstructive pulmonary disease (COPD). Sometimes, these complications can require emergency medical care and may be life threatening.  CAUSES Almost all URIs are caused by viruses. A virus is a type of germ and can spread from one person to another.  RISKS FACTORS You may be at risk for a URI if:   You smoke.   You have chronic heart or lung disease.  You have a weakened defense (immune) system.   You are very young or very old.   You have nasal allergies or asthma.  You work in crowded or poorly ventilated areas.  You work in health care facilities or schools. SIGNS AND SYMPTOMS  Symptoms typically develop 2-3 days after you come in contact with a cold virus. Most viral URIs last 7-10 days. However, viral URIs from the influenza virus (flu virus) can last 14-18 days and are typically more severe. Symptoms may include:   Runny or stuffy (congested) nose.   Sneezing.   Cough.   Sore throat.   Headache.   Fatigue.   Fever.   Loss of appetite.   Pain in your forehead, behind your eyes, and over your cheekbones (sinus pain).  Muscle aches.  DIAGNOSIS  Your health care provider may diagnose a URI by:  Physical exam.  Tests to check that your symptoms are not due to  another condition such as:  Strep throat.  Sinusitis.  Pneumonia.  Asthma. TREATMENT  A URI goes away on its own with time. It cannot be cured with medicines, but medicines may be prescribed or recommended to relieve symptoms. Medicines may help:  Reduce your fever.  Reduce your cough.  Relieve nasal congestion. HOME CARE INSTRUCTIONS   Take medicines only as directed by your health care provider.   Gargle warm saltwater or take cough drops to comfort your throat as directed by your health care provider.  Use a warm mist humidifier or inhale steam from a shower to increase air moisture. This may make it easier to breathe.  Drink enough fluid to keep your urine clear or pale yellow.   Eat soups and other clear broths and maintain good nutrition.   Rest as needed.   Return to work when your temperature has returned to normal or as your health care provider advises. You may need to stay home longer to avoid infecting others. You can also use a face mask and careful hand washing to prevent spread of the virus.  Increase the usage of your inhaler if you have asthma.   Do not use any tobacco products, including cigarettes, chewing tobacco, or electronic cigarettes. If you need help quitting, ask your health care provider. PREVENTION  The best way to protect yourself from getting a cold is to practice good hygiene.   Avoid oral or hand contact with people with cold  use any tobacco products, including cigarettes, chewing tobacco, or electronic cigarettes. If you need help quitting, ask your health care provider.  PREVENTION   The best way to protect yourself from getting a cold is to practice good hygiene.    Avoid oral or hand contact with people with cold symptoms.    Wash your hands often if contact occurs.   There is no clear evidence that vitamin C, vitamin E, echinacea, or exercise reduces the chance of developing a cold. However, it is always recommended to get plenty of rest, exercise, and practice good nutrition.   SEEK MEDICAL CARE IF:    You are getting worse rather than better.    Your symptoms are not controlled by medicine.    You have chills.   You have worsening shortness of breath.   You have brown or red mucus.   You have yellow or brown nasal  discharge.   You have pain in your face, especially when you bend forward.   You have a fever.   You have swollen neck glands.   You have pain while swallowing.   You have white areas in the back of your throat.  SEEK IMMEDIATE MEDICAL CARE IF:    You have severe or persistent:    Headache.    Ear pain.    Sinus pain.    Chest pain.   You have chronic lung disease and any of the following:    Wheezing.    Prolonged cough.    Coughing up blood.    A change in your usual mucus.   You have a stiff neck.   You have changes in your:    Vision.    Hearing.    Thinking.    Mood.  MAKE SURE YOU:    Understand these instructions.   Will watch your condition.   Will get help right away if you are not doing well or get worse.     This information is not intended to replace advice given to you by your health care provider. Make sure you discuss any questions you have with your health care provider.     Document Released: 03/16/2001 Document Revised: 02/04/2015 Document Reviewed: 12/26/2013  Elsevier Interactive Patient Education 2016 [employer]      Sinusitis, Adult  Sinusitis is redness, soreness, and inflammation of the paranasal sinuses. Paranasal sinuses are air pockets within the bones of your face. They are located beneath your eyes, in the middle of your forehead, and above your eyes. In healthy paranasal sinuses, mucus is able to drain out, and air is able to circulate through them by way of your nose. However, when your paranasal sinuses are inflamed, mucus and air can become trapped. This can allow bacteria and other germs to grow and cause infection.  Sinusitis can develop quickly and last only a short time (acute) or continue over a long period (chronic). Sinusitis that lasts for more than 12 weeks is considered chronic.  CAUSES  Causes of sinusitis include:   Allergies.   Structural abnormalities, such as displacement of the cartilage that separates your nostrils (deviated septum), which can  decrease the air flow through your nose and sinuses and affect sinus drainage.   Functional abnormalities, such as when the small hairs (cilia) that line your sinuses and help remove mucus do not work properly or are not present.  SIGNS AND SYMPTOMS  Symptoms of acute and chronic sinusitis are the same. The primary symptoms are pain and   pressure around the affected sinuses. Other symptoms include:   Upper toothache.   Earache.   Headache.   Bad breath.   Decreased sense of smell and taste.   A cough, which worsens when you are lying flat.   Fatigue.   Fever.   Thick drainage from your nose, which often is green and may contain pus (purulent).   Swelling and warmth over the affected sinuses.  DIAGNOSIS  Your health care provider will perform a physical exam. During your exam, your health care provider may perform any of the following to help determine if you have acute sinusitis or chronic sinusitis:   Look in your nose for signs of abnormal growths in your nostrils (nasal polyps).   Tap over the affected sinus to check for signs of infection.   View the inside of your sinuses using an imaging device that has a light attached (endoscope).  If your health care provider suspects that you have chronic sinusitis, one or more of the following tests may be recommended:   Allergy tests.   Nasal culture. A sample of mucus is taken from your nose, sent to a lab, and screened for bacteria.   Nasal cytology. A sample of mucus is taken from your nose and examined by your health care provider to determine if your sinusitis is related to an allergy.  TREATMENT  Most cases of acute sinusitis are related to a viral infection and will resolve on their own within 10 days. Sometimes, medicines are prescribed to help relieve symptoms of both acute and chronic sinusitis. These may include pain medicines, decongestants, nasal steroid sprays, or saline sprays.  However, for sinusitis related to a bacterial infection, your  health care provider will prescribe antibiotic medicines. These are medicines that will help kill the bacteria causing the infection.  Rarely, sinusitis is caused by a fungal infection. In these cases, your health care provider will prescribe antifungal medicine.  For some cases of chronic sinusitis, surgery is needed. Generally, these are cases in which sinusitis recurs more than 3 times per year, despite other treatments.  HOME CARE INSTRUCTIONS   Drink plenty of water. Water helps thin the mucus so your sinuses can drain more easily.   Use a humidifier.   Inhale steam 3-4 times a day (for example, sit in the bathroom with the shower running).   Apply a warm, moist washcloth to your face 3-4 times a day, or as directed by your health care provider.   Use saline nasal sprays to help moisten and clean your sinuses.   Take medicines only as directed by your health care provider.   If you were prescribed either an antibiotic or antifungal medicine, finish it all even if you start to feel better.  SEEK IMMEDIATE MEDICAL CARE IF:   You have increasing pain or severe headaches.   You have nausea, vomiting, or drowsiness.   You have swelling around your face.   You have vision problems.   You have a stiff neck.   You have difficulty breathing.     This information is not intended to replace advice given to you by your health care provider. Make sure you discuss any questions you have with your health care provider.     Document Released: 09/20/2005 Document Revised: 10/11/2014 Document Reviewed: 10/05/2011  Elsevier Interactive Patient Education 2016 [employer] Inc[employer]

## 2015-10-04 NOTE — ED Notes (Signed)
The patient presented to the [facility] with a complaint of sinus pressure and congestion x 1 day.

## 2015-10-05 NOTE — ED Provider Notes (Signed)
CSN: [id number]     Arrival date & time 10/04/15  1724 History   None    Chief Complaint  Patient presents with  . Facial Pain  . Nasal Congestion   (Consider location/radiation/quality/duration/timing/severity/associated sxs/prior Treatment) HPI Cough, sputum production, URI for the last several days. Facial pain Past Medical History  Diagnosis Date  . Coronary artery disease     sees Dr. Shelva Majestic   . Diverticulitis   . GERD (gastroesophageal reflux disease)   . Benign prostatic hypertrophy   . Partial small bowel obstruction ([place]) 04/16/10    resolved with bowel rest  . Hypertension   . Shortness of breath   . Pulmonary embolus ([place])     2000  . Arthritis     back, hips, knees   . Cancer [facility])     h/o renal carcinoma   . Chronic kidney disease     h/o UTI-2012  . Obesity     exogenous  . Myasthenia gravis [facility])     sees Dr. Jannifer Franklin  . Osteoporosis   . Heart disease   . H/O epistaxis   . Urinary incontinence   . UTI (urinary tract infection)    Past Surgical History  Procedure Laterality Date  . Total knee arthroplasty      both Sees Dr. Novella Olive  . Nephrectomy      left  . Angioplasty      had 2 stent replacements  . Stented placed  1/08  . Colonoscopy  05/28/10    per Dr. Laurence Spates, diverticulosis, no repeats planned   . Sp kyphoplasty      and vertebroplasty  . Cardiac catheterization      2008, stents   . Eye surgery      cataracts(bilateral)  removed, ?iol  . Tonsillectomy    . Joint replacement      2005-R, L knee replacement- 2000  . Total knee revision  03/28/2012    Procedure: TOTAL KNEE REVISION;  Surgeon: Hessie Dibble, MD;  Location: [place];  Service: Orthopedics;  Laterality: Right;  . Cardiac catheterization  2008  . Nm myoview ltd  02/2011    moderate inferior scar no ischemia  . Doppler echocardiography    . Stress dipyridamole myocardial perfusion     Family History  Problem Relation Age of Onset  . Colon cancer Mother     Social History  Substance Use Topics  . Smoking status: Former Smoker    Types: Cigarettes    Quit date: 03/22/1959  . Smokeless tobacco: Never Used     Comment: quit 1970s  . Alcohol Use: No    Review of Systems Cough, without  fever, vomiting diarrhea Allergies  Dicyclomine  Home Medications   Prior to Admission medications   Medication Sig Start Date End Date Taking? Authorizing Provider  ciprofloxacin (CIPRO) 250 MG tablet Take 250 mg by mouth daily.    Historical Provider, MD  clopidogrel (PLAVIX) 75 MG tablet TAKE 1 TABLET EACH DAY. 10/14/14   Troy Sine, MD  HYDROcodone-acetaminophen (NORCO/VICODIN) 5-325 MG per tablet Take 1 tablet by mouth every 6 (six) hours as needed for moderate pain. 03/10/15   [name], MD  metoprolol succinate (TOPROL-XL) 50 MG 24 hr tablet TAKE 1 TABLET ONCE DAILY. 09/23/15   Troy Sine, MD  Multiple Vitamin (MULTIVITAMIN WITH MINERALS) TABS Take 1 tablet by mouth daily.    Historical Provider, MD  mycophenolate (CELLCEPT) 500 MG tablet TAKE 1 TABLET EVERY  MORNING AND 2 TABLETS AT BEDTIME. 07/17/15   Kathrynn Ducking, MD  pyridostigmine (MESTINON) 60 MG tablet TAKE  (1)  TABLET  FOUR TIMES DAILY. 08/23/15   Kathrynn Ducking, MD  rosuvastatin (CRESTOR) 10 MG tablet Take 1 tablet (10 mg total) by mouth daily. NEED OV. 09/23/15   Troy Sine, MD  sulfamethoxazole-trimethoprim (BACTRIM DS,SEPTRA DS) 800-160 MG tablet Take 1 tablet by mouth 2 (two) times daily. 10/04/15 10/11/15  Konrad Felix, PA   Meds Ordered and Administered this Visit  Medications - No data to display  BP 123/60 mmHg  Pulse 86  Temp(Src) 98.1 F (36.7 C) (Oral) No data found.   Physical Exam NURSES NOTES AND VITAL SIGNS REVIEWED. CONSTITUTIONAL: Well developed, well nourished, no acute distress HEENT: normocephalic, atraumatic EYES: Conjunctiva normal NECK:normal ROM, supple PULMONARY:No respiratory distress, normal effort, Lungs: CTAb/l CARDIOVASCULAR:  RRR, no murmur ABDOMEN: soft, ND, NT, +'ve BS MUSCULOSKELETAL: Normal ROM of all extremities SKIN: warm and dry without rash PSYCHIATRIC: Mood and affect normal  ED Course  Procedures (including critical care time)  Labs Review Labs Reviewed - No data to display  Imaging Review No results found.   Visual Acuity Review  Right Eye Distance:   Left Eye Distance:   Bilateral Distance:    Right Eye Near:   Left Eye Near:    Bilateral Near:         MDM   1. URI (upper respiratory infection)    Patient is advised to continue home symptomatic treatment. Prescription for bactrim as requested by patient is  sent pharmacy patient has indicated. Patient is advised that if there are new or worsening symptoms or attend the emergency department, or contact primary care provider. Instructions of care provided discharged home in stable condition.  THIS NOTE WAS GENERATED USING A VOICE RECOGNITION SOFTWARE PROGRAM. ALL REASONABLE EFFORTS  WERE MADE TO PROOFREAD THIS DOCUMENT FOR ACCURACY.     Konrad Felix, [place] 10/05/15 1029

## 2015-10-07 ENCOUNTER — Ambulatory Visit (INDEPENDENT_AMBULATORY_CARE_PROVIDER_SITE_OTHER): Payer: Medicare Other | Admitting: Cardiovascular Disease

## 2015-10-07 ENCOUNTER — Encounter: Payer: Self-pay | Admitting: Cardiovascular Disease

## 2015-10-07 VITALS — BP 136/80 | HR 67 | Ht 68.0 in | Wt 255.8 lb

## 2015-10-07 DIAGNOSIS — I2510 Atherosclerotic heart disease of native coronary artery without angina pectoris: Secondary | ICD-10-CM | POA: Diagnosis not present

## 2015-10-07 DIAGNOSIS — I10 Essential (primary) hypertension: Secondary | ICD-10-CM | POA: Diagnosis not present

## 2015-10-07 DIAGNOSIS — E785 Hyperlipidemia, unspecified: Secondary | ICD-10-CM

## 2015-10-07 DIAGNOSIS — I440 Atrioventricular block, first degree: Secondary | ICD-10-CM

## 2015-10-07 MED ORDER — ROSUVASTATIN CALCIUM 10 MG PO TABS: 10.0000 mg | ORAL_TABLET | Freq: Every day | ORAL | Status: DC

## 2015-10-07 NOTE — Patient Instructions (Signed)
Your physician recommends that you return for lab work in: 1 month.  Your physician wants you to follow-up in: 1 year or sooner if needed. You will receive a reminder letter in the mail two months in advance. If you don't receive a letter, please call our office to schedule the follow-up appointment.  If you need a refill on your cardiac medications before your next appointment, please call your pharmacy.

## 2015-10-08 ENCOUNTER — Encounter: Payer: Self-pay | Admitting: Cardiovascular Disease

## 2015-10-08 DIAGNOSIS — I440 Atrioventricular block, first degree: Secondary | ICD-10-CM | POA: Insufficient documentation

## 2015-10-08 NOTE — Progress Notes (Signed)
Patient ID: [name], male   DOB: [birth year], 82 y.o.   MRN: [id number]     HPI: [name] is an 82 year old gentleman who is a former patient of Dr. Rollene Fare.  He presents to the office today for a 13 month follow-up cardiology evaluation.  [name] has a history of CAD and in April 2002 underwent stenting of his mid LAD with bare-metal stent. At that time he was found to have an occluded mid circumflex with right-to-left collaterals and no significant right coronary artery disease. He underwent a subsequent catheterization in 2008 and was found to have high-grade distal RCA stenosis for which he was underwent insertion of a 3.0x13 mm drug-eluting Cypher stent. His LAD stent was patent but he did have 70% stenosis in the diagonal vessel arising from the proximal portion of the stent. His distal circumflex again was subtotally occluded. Ejection fraction was 55%. His last Myoview study was in May 2012 which showed moderate inferior scar without significant ischemia. He has been on medical therapy for his coronary artery disease.  Additional problems include undergoing a left nephrectomy in 1992 for nonmalignant disease done by Dr. Rosana Hoes. He also has a history of myasthenia gravis for which he has been on chronic Mestinon therapy in the direction of Dr. Jannifer Franklin.  He is currently receiving Mestinon 60 mg 4 times a day in addition to CellCept 500 mg in the morning and 1000 g at bedtime.  On this therapy, his myasthenia gravis has been stable and not progressed.  He has undergone knee replacement surgery bilaterally.   Over the past year, he has maintained good activity.  He works out at [employer].  He denies episodes of chest pressure.  He denies change in exercise tolerance.  He is unaware of any palpitations.  He sees Dr. Delma Freeze, for primary care.  He has a history of hypertension as well as hyperlipidemia.  He has been taking metoprolol XL 50 mg daily as well as Crestor 10 mg.  He is on  chronic Plavix 75 mg but does not take aspirin.  He was recently started on over-the-counter vitamin D.  He denies presyncope or syncope.   He presents for one-year evaluation.  Past Medical History  Diagnosis Date  . Coronary artery disease     sees Dr. Shelva Majestic   . Diverticulitis   . GERD (gastroesophageal reflux disease)   . Benign prostatic hypertrophy   . Partial small bowel obstruction ([place]) 04/16/10    resolved with bowel rest  . Hypertension   . Shortness of breath   . Pulmonary embolus ([place])     2000  . Arthritis     back, hips, knees   . Cancer [facility])     h/o renal carcinoma   . Chronic kidney disease     h/o UTI-2012  . Obesity     exogenous  . Myasthenia gravis [facility])     sees Dr. Jannifer Franklin  . Osteoporosis   . Heart disease   . H/O epistaxis   . Urinary incontinence   . UTI (urinary tract infection)     Past Surgical History  Procedure Laterality Date  . Total knee arthroplasty      both Sees Dr. Novella Olive  . Nephrectomy      left  . Angioplasty      had 2 stent replacements  . Stented placed  1/08  . Colonoscopy  05/28/10    per Dr. Laurence Spates, diverticulosis, no repeats  planned   . Sp kyphoplasty      and vertebroplasty  . Cardiac catheterization      2008, stents   . Eye surgery      cataracts(bilateral)  removed, ?iol  . Tonsillectomy    . Joint replacement      2005-R, L knee replacement- 2000  . Total knee revision  03/28/2012    Procedure: TOTAL KNEE REVISION;  Surgeon: Hessie Dibble, MD;  Location: [place];  Service: Orthopedics;  Laterality: Right;  . Cardiac catheterization  2008  . Nm myoview ltd  02/2011    moderate inferior scar no ischemia  . Doppler echocardiography    . Stress dipyridamole myocardial perfusion      Allergies  Allergen Reactions  . Dicyclomine Other (See Comments)    REACTION: can worsen myasthenia gravis    Current Outpatient Prescriptions  Medication Sig Dispense Refill  . ciprofloxacin (CIPRO) 250 MG  tablet Take 250 mg by mouth daily.    . clopidogrel (PLAVIX) 75 MG tablet TAKE 1 TABLET EACH DAY. 30 tablet 10  . metoprolol succinate (TOPROL-XL) 50 MG 24 hr tablet TAKE 1 TABLET ONCE DAILY. 30 tablet 0  . Multiple Vitamin (MULTIVITAMIN WITH MINERALS) TABS Take 1 tablet by mouth daily.    . mycophenolate (CELLCEPT) 500 MG tablet TAKE 1 TABLET EVERY MORNING AND 2 TABLETS AT BEDTIME. 90 tablet 6  . pyridostigmine (MESTINON) 60 MG tablet TAKE  (1)  TABLET  FOUR TIMES DAILY. 120 tablet 5  . sulfamethoxazole-trimethoprim (BACTRIM DS,SEPTRA DS) 800-160 MG tablet Take 1 tablet by mouth 2 (two) times daily. 14 tablet 0  . rosuvastatin (CRESTOR) 10 MG tablet Take 1 tablet (10 mg total) by mouth daily. 30 tablet 11   No current facility-administered medications for this visit.    Social history is notable in that he was in the [employer] in [place] in the [employer]. He worked in [employer]. He is married for 14 years. He has 3 children. There is no recent tobacco use having quit greater than 35 years ago. No alcohol use. He previously had been active in [employer] and retired in 2007.  Family History  Problem Relation Age of Onset  . Colon cancer Mother    ROS General: Negative; No fevers, chills, or night sweats;  HEENT: Negative; No changes in vision or hearing, sinus congestion, difficulty swallowing Pulmonary: Negative; No cough, wheezing, shortness of breath, hemoptysis Cardiovascular: Negative; No chest pain, presyncope, syncope, palpitations GI: Negative; No nausea, vomiting, diarrhea, or abdominal pain GU: Negative; No dysuria, hematuria, or difficulty voiding Musculoskeletal: Negative; no myalgias, joint pain, or weakness Hematologic/Oncology: Negative; no easy bruising, bleeding Endocrine: Negative; no heat/cold intolerance; no diabetes Neuro: Positive for myasthenia gravis, currently controlled and not progressing Skin: Negative; No rashes or skin lesions  presently Psychiatric: Negative; No behavioral problems, depression Sleep: Negative; No snoring, daytime sleepiness, hypersomnolence, bruxism, restless legs, hypnogognic hallucinations, no cataplexy Other comprehensive 14 point system review is negative.   PE BP 136/80 mmHg  Pulse 67  Ht _0  (1.727 m)  Wt 255 lb 12.8 oz (116.03 kg)  BMI 38.90 kg/m2   Wt Readings from Last 3 Encounters:  10/07/15 255 lb 12.8 oz (116.03 kg)  06/19/15 259 lb (117.482 kg)  03/10/15 257 lb 3.2 oz (116.665 kg)   Repeat blood pressure by me was 138/82. Pulse 76 General: Alert, oriented, no distress.  Skin: normal turgor, no rashes HEENT: Normocephalic, atraumatic. Pupils round and  reactive; sclera anicteric; Fundi no hemorrhages or exudates Nose without nasal septal hypertrophy Mouth/Parynx benign; Mallinpatti scale 3 Neck: Thick neck No JVD, no carotid bruits; normal carotid upstrokes Lungs: clear to ausculatation and percussion; no wheezing or rales Chest wall: Nontender to palpation Heart: RRR, s1 s2 normal 1/6 systolic murmur.  No diastolic murmur.  No rubs thrills or heaves Abdomen: Moderate obesity; soft, nontender; no hepatosplenomehaly, BS+; abdominal aorta nontender and not dilated by palpation. Pulses 2+ Extremities: no clubbing cyanosis or edema, Homan's sign negative; no muscle tenderness to palpation Neurologic: grossly nonfocal Psychological: Normal affect and mood.  ECG (independently read by me): Normal sinus rhythm with first-degree AV block with a PR interval at 214 ms.  Early transition.  Probable LVH by voltage criteria in aVL.  ECG (independently read by me): Normal sinus rhythm at 85 bpm.  Early transition.  QTc interval 466 ms.  PR interval 198 ms.  October 2014 ECG: Normal sinus rhythm with first-degree AV block with PR interval 224 ms. Early transition. Voltage criteria for left ventricular hypertrophy.  LABS:  BMP Latest Ref Rng 09/18/2015 06/19/2015 12/16/2014  Glucose  65 - 99 mg/dL 103(H) 104(H) 100(H)  BUN 8 - 27 mg/dL _0 Creatinine 0.76 - 1.27 mg/dL 1.04 1.05 0.94  BUN/Creat Ratio 10 - _1 Sodium 134 - 144 mmol/L 140 142 140  Potassium 3.5 - 5.2 mmol/L 4.3 4.4 4.4  Chloride 96 - 106 mmol/L 105 105 105  CO2 18 - 29 mmol/L _2 Calcium 8.6 - 10.2 mg/dL 9.0 9.2 9.1   Hepatic Function Latest Ref Rng 09/18/2015 06/19/2015 12/16/2014  Total Protein 6.0 - 8.5 g/dL 6.5 6.6 6.2  Albumin 3.5 - 4.7 g/dL 3.9 3.9 3.7  AST 0 - 40 IU/L _3 ALT 0 - 44 IU/L _4 Alk Phosphatase 39 - 117 IU/L 55 55 67  Total Bilirubin 0.0 - 1.2 mg/dL 0.5 0.5 0.4  Bilirubin, Direct 0.0 - 0.3 mg/dL - - -   CBC Latest Ref Rng 09/18/2015 06/19/2015 12/16/2014  WBC 3.4 - 10.8 x10E3/uL 3.8 3.3(L) 3.4  Hemoglobin 12.6 - 17.7 g/dL - - 14.1  Hematocrit 37.5 - 51.0 % 41.4 42.1 41.5  Platelets 150 - 379 x10E3/uL - - 192   Lab Results  Component Value Date   MCV 92 12/16/2014   MCV 94.9 09/15/2014   MCV 94 09/05/2014   Lab Results  Component Value Date   TSH 2.700 09/05/2014   No results found for: HGBA1C   Lipid Panel     Component Value Date/Time   CHOL 118 09/05/2014 1355   TRIG 154* 09/05/2014 1355   HDL 34* 09/05/2014 1355   CHOLHDL 3.5 09/05/2014 1355   LDLCALC 53 09/05/2014 1355    RADIOLOGY: No results found.   ASSESSMENT AND PLAN: [name] is an active 82 year-old gentleman who has established coronary artery disease dating back to 2002 when he underwent initial stenting of his LAD.  His last cardiac catheterization was in 2008 at which time he underwent stenting of his distal RCA. He has documented subtotal occlusion of his distal circumflex vessel for which he has been on chronic beta blocker therapy.Marland Kitchen His last nuclear perfusion study in May 2012 show a small region of inferior scar. Clinically, he is doing well on his current therapy.  His blood pressure today is controlled on current therapy.  He has been taking Crestor 10 mg for  hyperlipidemia.  His myasthenia gravis appears to be stable.  He is not having anginal symptoms on his current beta blocker regimen with Plavix.  He tells me he was recently started on vitamin D supplementation.  His most recent laboratory was reviewed.  He has evidence for first-degree AV block, ventricular rate is controlled at 26.  He will continue current therapy.  Several months follow-up laboratory will be obtained.  He does have moderate obesity with a BMI of 38.9; continued exercise and weight reduction was recommended.  I will see him in one year for reevaluation or sooner if there are a change in symptoms.  Time spent: 25 minutes Troy Sine, MD, [facility] 10/08/2015 7:19 PM

## 2015-10-15 ENCOUNTER — Telehealth: Payer: Self-pay | Admitting: Cardiovascular Disease

## 2015-10-15 DIAGNOSIS — E785 Hyperlipidemia, unspecified: Secondary | ICD-10-CM

## 2015-10-15 DIAGNOSIS — I10 Essential (primary) hypertension: Secondary | ICD-10-CM

## 2015-10-15 NOTE — Telephone Encounter (Signed)
Pt presented to [facility] for bloodwork - they could not accept orders placed for [facility] - reordered for [facility].

## 2015-10-16 LAB — COMPREHENSIVE METABOLIC PANEL
ALT: 33 IU/L (ref 0–44)
AST: 29 IU/L (ref 0–40)
Albumin/Globulin Ratio: 1.5 (ref 1.1–2.5)
Albumin: 4 g/dL (ref 3.5–4.7)
Alkaline Phosphatase: 58 IU/L (ref 39–117)
BUN/Creatinine Ratio: 19 (ref 10–22)
BUN: 20 mg/dL (ref 8–27)
Bilirubin Total: 0.6 mg/dL (ref 0.0–1.2)
CALCIUM: 9.3 mg/dL (ref 8.6–10.2)
CO2: 21 mmol/L (ref 18–29)
CREATININE: 1.08 mg/dL (ref 0.76–1.27)
Chloride: 103 mmol/L (ref 96–106)
GFR calc Af Amer: 70 mL/min/{1.73_m2} (ref >59)
GFR, EST NON AFRICAN AMERICAN: 61 mL/min/{1.73_m2} (ref >59)
GLOBULIN, TOTAL: 2.7 g/dL (ref 1.5–4.5)
Glucose: 114 mg/dL — ABNORMAL HIGH (ref 65–99)
Potassium: 4.6 mmol/L (ref 3.5–5.2)
Sodium: 139 mmol/L (ref 134–144)
Total Protein: 6.7 g/dL (ref 6.0–8.5)

## 2015-10-16 LAB — CBC
HEMATOCRIT: 42.2 % (ref 37.5–51.0)
Hemoglobin: 14.5 g/dL (ref 12.6–17.7)
MCH: 32.1 pg (ref 26.6–33.0)
MCHC: 34.4 g/dL (ref 31.5–35.7)
MCV: 93 fL (ref 79–97)
PLATELETS: 219 10*3/uL (ref 150–379)
RBC: 4.52 x10E6/uL (ref 4.14–5.80)
RDW: 13.5 % (ref 12.3–15.4)
WBC: 3.4 10*3/uL (ref 3.4–10.8)

## 2015-10-16 LAB — LIPID PANEL
CHOL/HDL RATIO: 3.3 ratio (ref 0.0–5.0)
Cholesterol, Total: 122 mg/dL (ref 100–199)
HDL: 37 mg/dL — AB (ref >39)
LDL CALC: 61 mg/dL (ref 0–99)
TRIGLYCERIDES: 118 mg/dL (ref 0–149)
VLDL Cholesterol Cal: 24 mg/dL (ref 5–40)

## 2015-10-23 ENCOUNTER — Telehealth: Payer: Self-pay | Admitting: Cardiovascular Disease

## 2015-10-23 NOTE — Telephone Encounter (Signed)
[name] is calling to get the results of his test . Please call   Thanks

## 2015-10-23 NOTE — Telephone Encounter (Signed)
Reviewed results of recent labs Told patient his HDL is improving; it is going up

## 2015-10-24 ENCOUNTER — Other Ambulatory Visit: Payer: Self-pay | Admitting: Cardiovascular Disease

## 2015-10-24 NOTE — Telephone Encounter (Signed)
Rx(s) sent to pharmacy electronically.  

## 2015-10-27 ENCOUNTER — Encounter: Payer: Self-pay | Admitting: *Deleted

## 2015-11-22 ENCOUNTER — Emergency Department (INDEPENDENT_AMBULATORY_CARE_PROVIDER_SITE_OTHER)
Admission: EM | Admit: 2015-11-22 | Discharge: 2015-11-22 | Disposition: A | Discharge status: Home / Self Care | Payer: Medicare Other | Source: Home / Self Care | Attending: Emergency Medicine | Admitting: Emergency Medicine

## 2015-11-22 ENCOUNTER — Emergency Department (INDEPENDENT_AMBULATORY_CARE_PROVIDER_SITE_OTHER): Payer: Medicare Other

## 2015-11-22 ENCOUNTER — Emergency Department (HOSPITAL_COMMUNITY)
Admission: EM | Admit: 2015-11-22 | Discharge: 2015-11-22 | Disposition: A | Discharge status: Home / Self Care | Payer: Medicare Other | Source: Home / Self Care

## 2015-11-22 ENCOUNTER — Encounter (HOSPITAL_COMMUNITY): Payer: Self-pay | Admitting: Emergency Medicine

## 2015-11-22 DIAGNOSIS — J018 Other acute sinusitis: Secondary | ICD-10-CM

## 2015-11-22 MED ORDER — AMOXICILLIN-POT CLAVULANATE 875-125 MG PO TABS: 1.0000 | ORAL_TABLET | Freq: Two times a day (BID) | ORAL | Status: DC

## 2015-11-22 MED ORDER — PREDNISONE 10 MG PO TABS: ORAL_TABLET | ORAL | Status: DC

## 2015-11-22 NOTE — Discharge Instructions (Signed)
You  have a persistent sinus infection. Take Augmentin and prednisone as prescribed.  You should see improvement in the next several days.  Follow-up as needed.

## 2015-11-22 NOTE — ED Notes (Signed)
The patient presented to the [facility] with a complaint of a cough and possible URI. The patient stated that he was treated for a URI on 10/04/2015 at the [facility] but the symptoms have not totally gone way.

## 2015-11-22 NOTE — ED Provider Notes (Signed)
CSN: [id number]     Arrival date & time 11/22/15  1633 History   First MD Initiated Contact with Patient 11/22/15 1745     Chief Complaint  Patient presents with  . Cough  . URI   (Consider location/radiation/quality/duration/timing/severity/associated sxs/prior Treatment) HPI  He is an 82 year old man here for evaluation of cough and sinus issue.  He was seen here the end of December for nasal congestion and sinus pressure. At that time he was treated with Bactrim. He states his symptoms never really seemed to go away. They did temporarily improved, but have been persistent for the last month and a half.  He reports continued congestion and facial pressure. He does have bloody discharge from his nose. He also has a cough that is intermittently productive. He denies any fevers or chills. No nausea or vomiting. His appetite is normal.  Past Medical History  Diagnosis Date  . Coronary artery disease     sees Dr. Shelva Majestic   . Diverticulitis   . GERD (gastroesophageal reflux disease)   . Benign prostatic hypertrophy   . Partial small bowel obstruction ([place]) 04/16/10    resolved with bowel rest  . Hypertension   . Shortness of breath   . Pulmonary embolus ([place])     2000  . Arthritis     back, hips, knees   . Cancer [facility])     h/o renal carcinoma   . Chronic kidney disease     h/o UTI-2012  . Obesity     exogenous  . Myasthenia gravis [facility])     sees Dr. Jannifer Franklin  . Osteoporosis   . Heart disease   . H/O epistaxis   . Urinary incontinence   . UTI (urinary tract infection)    Past Surgical History  Procedure Laterality Date  . Total knee arthroplasty      both Sees Dr. Novella Olive  . Nephrectomy      left  . Angioplasty      had 2 stent replacements  . Stented placed  1/08  . Colonoscopy  05/28/10    per Dr. Laurence Spates, diverticulosis, no repeats planned   . Sp kyphoplasty      and vertebroplasty  . Cardiac catheterization      2008, stents   . Eye surgery     cataracts(bilateral)  removed, ?iol  . Tonsillectomy    . Joint replacement      2005-R, L knee replacement- 2000  . Total knee revision  03/28/2012    Procedure: TOTAL KNEE REVISION;  Surgeon: Hessie Dibble, MD;  Location: [place];  Service: Orthopedics;  Laterality: Right;  . Cardiac catheterization  2008  . Nm myoview ltd  02/2011    moderate inferior scar no ischemia  . Doppler echocardiography    . Stress dipyridamole myocardial perfusion     Family History  Problem Relation Age of Onset  . Colon cancer Mother    Social History  Substance Use Topics  . Smoking status: Former Smoker    Types: Cigarettes    Quit date: 03/22/1959  . Smokeless tobacco: Never Used     Comment: quit 1970s  . Alcohol Use: No    Review of Systems As in history of present illness Allergies  Dicyclomine  Home Medications   Prior to Admission medications   Medication Sig Start Date End Date Taking? Authorizing Provider  clopidogrel (PLAVIX) 75 MG tablet TAKE 1 TABLET EACH DAY. 10/24/15  Yes Troy Sine, MD  metoprolol succinate (TOPROL-XL) 50 MG 24 hr tablet TAKE 1 TABLET ONCE DAILY. 10/24/15  Yes Troy Sine, MD  Multiple Vitamin (MULTIVITAMIN WITH MINERALS) TABS Take 1 tablet by mouth daily.   Yes Historical Provider, MD  mycophenolate (CELLCEPT) 500 MG tablet TAKE 1 TABLET EVERY MORNING AND 2 TABLETS AT BEDTIME. 07/17/15  Yes Kathrynn Ducking, MD  pyridostigmine (MESTINON) 60 MG tablet TAKE  (1)  TABLET  FOUR TIMES DAILY. 08/23/15  Yes Kathrynn Ducking, MD  rosuvastatin (CRESTOR) 10 MG tablet Take 1 tablet (10 mg total) by mouth daily. 10/07/15  Yes Troy Sine, MD  amoxicillin-clavulanate (AUGMENTIN) 875-125 MG tablet Take 1 tablet by mouth 2 (two) times daily. 11/22/15   Melony Overly, MD  ciprofloxacin (CIPRO) 250 MG tablet Take 250 mg by mouth daily.    Historical Provider, MD  predniSONE (DELTASONE) 10 MG tablet Take 6 tablets on day 1, 5 on day 2, 4 on day 3, 3 on day 4, 2 on day 5,  and 1 on day 6. 11/22/15   Melony Overly, MD   Meds Ordered and Administered this Visit  Medications - No data to display  BP 128/68 mmHg  Pulse 83  Temp(Src) 97.6 F (36.4 C) (Oral)  Resp 20  SpO2 94% No data found.   Physical Exam  Constitutional: He is oriented to person, place, and time. He appears well-developed and well-nourished. No distress.  HENT:  Mouth/Throat: Oropharynx is clear and moist. No oropharyngeal exudate.  He does have dried blood in the right naris.  Neck: Neck supple.  Cardiovascular: Normal rate and regular rhythm.   Murmur (2/6 systolic murmur) heard. Pulmonary/Chest: Effort normal and breath sounds normal. No respiratory distress. He has no wheezes. He has no rales.  Lymphadenopathy:    He has no cervical adenopathy.  Neurological: He is alert and oriented to person, place, and time.    ED Course  Procedures (including critical care time)  Labs Review Labs Reviewed - No data to display  Imaging Review Dg Chest 2 View  11/22/2015  CLINICAL DATA:  Productive cough EXAM: CHEST  2 VIEW COMPARISON:  02/21/2014 FINDINGS: Stable mild cardiac enlargement with calcification and uncoiling of the aorta. Thoracic spine kyphoplasty unchanged. Vascular pattern normal. Mild background interstitial prominence similar to prior study allowing for differences in technique. Mild scarring left lower lobe unchanged. No consolidation or effusion. IMPRESSION: No active cardiopulmonary disease. Electronically Signed   By: Skipper Cliche M.D.   On: 11/22/2015 18:18      MDM   1. Other subacute sinusitis    We'll treat with Augmentin and prednisone taper. Follow-up as needed.    Melony Overly, MD 11/22/15 [phone]

## 2015-11-24 NOTE — Telephone Encounter (Signed)
Closed

## 2015-12-17 ENCOUNTER — Ambulatory Visit (INDEPENDENT_AMBULATORY_CARE_PROVIDER_SITE_OTHER): Payer: Medicare Other | Admitting: Neurology

## 2015-12-17 ENCOUNTER — Encounter: Payer: Self-pay | Admitting: Neurology

## 2015-12-17 VITALS — BP 155/79 | HR 69 | Ht 70.0 in | Wt 253.5 lb

## 2015-12-17 DIAGNOSIS — G7000 Myasthenia gravis without (acute) exacerbation: Secondary | ICD-10-CM

## 2015-12-17 DIAGNOSIS — I2510 Atherosclerotic heart disease of native coronary artery without angina pectoris: Secondary | ICD-10-CM | POA: Diagnosis not present

## 2015-12-17 NOTE — Progress Notes (Signed)
Reason for visit: Myasthenia gravis  [name] is an 82 y.o. male  History of present illness:  [name] is an 82 year old right-handed white male with a history of myasthenia gravis. The patient will occasionally have some double vision when he gets tired, otherwise he does quite well. He has not had any weakness of the extremities. He has had some issues with muscle cramps in the legs, but he has taken mustard supplementation which has helped. The patient denies any new medical issues that have come up since last seen. He remains on CellCept and Mestinon. Overall, he feels well.  Past Medical History  Diagnosis Date  . Coronary artery disease     sees Dr. Shelva Majestic   . Diverticulitis   . GERD (gastroesophageal reflux disease)   . Benign prostatic hypertrophy   . Partial small bowel obstruction ([place]) 04/16/10    resolved with bowel rest  . Hypertension   . Shortness of breath   . Pulmonary embolus ([place])     2000  . Arthritis     back, hips, knees   . Cancer [facility])     h/o renal carcinoma   . Chronic kidney disease     h/o UTI-2012  . Obesity     exogenous  . Myasthenia gravis [facility])     sees Dr. Jannifer Franklin  . Osteoporosis   . Heart disease   . H/O epistaxis   . Urinary incontinence   . UTI (urinary tract infection)     Past Surgical History  Procedure Laterality Date  . Total knee arthroplasty      both Sees Dr. Novella Olive  . Nephrectomy      left  . Angioplasty      had 2 stent replacements  . Stented placed  1/08  . Colonoscopy  05/28/10    per Dr. Laurence Spates, diverticulosis, no repeats planned   . Sp kyphoplasty      and vertebroplasty  . Cardiac catheterization      2008, stents   . Eye surgery      cataracts(bilateral)  removed, ?iol  . Tonsillectomy    . Joint replacement      2005-R, L knee replacement- 2000  . Total knee revision  03/28/2012    Procedure: TOTAL KNEE REVISION;  Surgeon: Hessie Dibble, MD;  Location: [place];  Service:  Orthopedics;  Laterality: Right;  . Cardiac catheterization  2008  . Nm myoview ltd  02/2011    moderate inferior scar no ischemia  . Doppler echocardiography    . Stress dipyridamole myocardial perfusion      Family History  Problem Relation Age of Onset  . Colon cancer Mother     Social history:  reports that he quit smoking about 56 years ago. His smoking use included Cigarettes. He has never used smokeless tobacco. He reports that he does not drink alcohol or use illicit drugs.    Allergies  Allergen Reactions  . Dicyclomine Other (See Comments)    REACTION: can worsen myasthenia gravis    Medications:  Prior to Admission medications   Medication Sig Start Date End Date Taking? Authorizing Provider  amoxicillin-clavulanate (AUGMENTIN) 875-125 MG tablet Take 1 tablet by mouth 2 (two) times daily. 11/22/15   Melony Overly, MD  ciprofloxacin (CIPRO) 250 MG tablet Take 250 mg by mouth daily.    Historical Provider, MD  clopidogrel (PLAVIX) 75 MG tablet TAKE 1 TABLET EACH DAY. 10/24/15   Joyice Faster  Claiborne Billings, MD  metoprolol succinate (TOPROL-XL) 50 MG 24 hr tablet TAKE 1 TABLET ONCE DAILY. 10/24/15   Troy Sine, MD  Multiple Vitamin (MULTIVITAMIN WITH MINERALS) TABS Take 1 tablet by mouth daily.    Historical Provider, MD  mycophenolate (CELLCEPT) 500 MG tablet TAKE 1 TABLET EVERY MORNING AND 2 TABLETS AT BEDTIME. 07/17/15   Kathrynn Ducking, MD  predniSONE (DELTASONE) 10 MG tablet Take 6 tablets on day 1, 5 on day 2, 4 on day 3, 3 on day 4, 2 on day 5, and 1 on day 6. 11/22/15   Melony Overly, MD  pyridostigmine (MESTINON) 60 MG tablet TAKE  (1)  TABLET  FOUR TIMES DAILY. 08/23/15   Kathrynn Ducking, MD  rosuvastatin (CRESTOR) 10 MG tablet Take 1 tablet (10 mg total) by mouth daily. 10/07/15   Troy Sine, MD    ROS:  Out of a complete 14 system review of symptoms, the patient complains only of the following symptoms, and all other reviewed systems are negative.  Double  vision  Blood pressure 155/79, pulse 69, height 5\' 10"  (1.778 m), weight 253 lb 8 oz (114.987 kg).  Physical Exam  General: The patient is alert and cooperative at the time of the examination. The patient is moderately to markedly obese.  Skin: No significant peripheral edema is noted.   Neurologic Exam  Mental status: The patient is alert and oriented x 3 at the time of the examination. The patient has apparent normal recent and remote memory, with an apparently normal attention span and concentration ability.   Cranial nerves: Facial symmetry is present. Speech is normal, no aphasia or dysarthria is noted. Extraocular movements are full. Visual fields are full.  Motor: The patient has good strength in all 4 extremities.  Sensory examination: Soft touch sensation is symmetric on the face, arms, and legs.  Coordination: The patient has good finger-nose-finger and heel-to-shin bilaterally.  Gait and station: The patient has a slightly wide-based gait. The patient can walk independently. Romberg is negative. No drift is seen.  Reflexes: Deep tendon reflexes are symmetric, but are depressed.   Assessment/Plan:  1. Myasthenia gravis  The patient is doing well with his disease at this point. The patient is to remain on the CellCept and Mestinon, he recently had blood work done in January 2017. He will follow-up through this office in 6 months.  Jill Alexanders MD 12/17/2015 9:14 AM  [facility] [address] [place], [place] [address]  Phone [phone] Fax [phone]

## 2015-12-17 NOTE — Patient Instructions (Signed)
Myasthenia Gravis Myasthenia gravis (MG) means severe weakness. It is a long-term (chronic) condition that causes weakness in the muscles you can control (voluntary muscles). MG can affect any voluntary muscle. The muscles most often affected are the ones that control:   Eye movement.  Facial movements.  Swallowing. MG is an autoimmune disease, which means that your body's defense system (immune system) attacks healthy parts of your body instead of germs and other things that make you sick. When you have MG, your immune system makes proteins (antibodies) that block the chemical (acetylcholine) your body needs to send nerve signals to your muscles. This causes muscle weakness. CAUSES  The exact cause of MG is unknown. One possible cause is an enlarged thymus gland, which is located under your breastbone.  SIGNS AND SYMPTOMS The earliest symptom of MG is muscle weakness that gets worse with activity and gets better after rest. Other symptoms of MG may include:  Drooping eyelids.  Double vision.  Loss of facial expression.  Trouble chewing and swallowing.  Slurred speech.  A waddling walk.  Weakness of the arms, hands, and legs. Trouble breathing is the most dangerous symptom of MG. Sudden and severe difficulty breathing (myasthenic crisis) may require emergency breathing support. This symptom sometimes happens after:   Infection.  Fever.  Drug reaction. DIAGNOSIS  It can be hard to diagnose MG because muscle weakness is a common symptom in many conditions. Your health care provider will do a physical exam. You may also have tests that will help make a diagnosis. These may include:  A blood test.  A test using the medicine edrophonium. This medicine increases muscle strength by slowing the breakdown of acetylcholine.  Tests to measure nerve conduction to muscle (electromyography).  An imaging study of the chest (CT or MRI). TREATMENT  Treatment can improve muscle strength.  Sometimes symptoms of MG go away for a while (remission) and you can stop treatment. Possible treatments include:  Medicine.  Removal of the thymus gland (thymectomy). This may result in a long remission for some people. HOME CARE INSTRUCTIONS  Take medicines only as directed by your health care provider.  Get plenty of rest to conserve your energy.  Take frequent breaks to rest your eyes.  Maintain a healthy diet and a healthy weight.  Do not use any tobacco products including cigarettes, chewing tobacco, or electronic cigarettes. If you need help quitting, ask your health care provider.  Keep all follow-up visits as directed by your health care provider. This is important. SEEK MEDICAL CARE IF:  Your symptoms get worse after a fever or infection.  You have a reaction to a medicine you are taking.  Your symptoms change or get worse. SEEK IMMEDIATE MEDICAL CARE IF: You have trouble breathing.    This information is not intended to replace advice given to you by your health care provider. Make sure you discuss any questions you have with your health care provider.   Document Released: 12/27/2000 Document Revised: 10/11/2014 Document Reviewed: 11/21/2013 Elsevier Interactive Patient Education 2016 Elsevier Inc.  

## 2016-01-06 ENCOUNTER — Other Ambulatory Visit: Payer: Self-pay | Admitting: Cardiovascular Disease

## 2016-01-07 NOTE — Telephone Encounter (Signed)
cipro refill refused.

## 2016-01-28 ENCOUNTER — Ambulatory Visit (HOSPITAL_COMMUNITY)
Admission: EM | Admit: 2016-01-28 | Discharge: 2016-01-28 | Disposition: A | Discharge status: Home / Self Care | Payer: Medicare Other | Attending: Family Medicine | Admitting: Family Medicine

## 2016-01-28 ENCOUNTER — Encounter (HOSPITAL_COMMUNITY): Payer: Self-pay | Admitting: Emergency Medicine

## 2016-01-28 DIAGNOSIS — Z85528 Personal history of other malignant neoplasm of kidney: Secondary | ICD-10-CM | POA: Insufficient documentation

## 2016-01-28 DIAGNOSIS — N400 Enlarged prostate without lower urinary tract symptoms: Secondary | ICD-10-CM | POA: Insufficient documentation

## 2016-01-28 DIAGNOSIS — Z79899 Other long term (current) drug therapy: Secondary | ICD-10-CM | POA: Diagnosis not present

## 2016-01-28 DIAGNOSIS — N3090 Cystitis, unspecified without hematuria: Secondary | ICD-10-CM | POA: Diagnosis not present

## 2016-01-28 DIAGNOSIS — Z96653 Presence of artificial knee joint, bilateral: Secondary | ICD-10-CM | POA: Insufficient documentation

## 2016-01-28 DIAGNOSIS — Z905 Acquired absence of kidney: Secondary | ICD-10-CM | POA: Insufficient documentation

## 2016-01-28 DIAGNOSIS — I2510 Atherosclerotic heart disease of native coronary artery without angina pectoris: Secondary | ICD-10-CM | POA: Diagnosis not present

## 2016-01-28 DIAGNOSIS — I129 Hypertensive chronic kidney disease with stage 1 through stage 4 chronic kidney disease, or unspecified chronic kidney disease: Secondary | ICD-10-CM | POA: Diagnosis not present

## 2016-01-28 DIAGNOSIS — K219 Gastro-esophageal reflux disease without esophagitis: Secondary | ICD-10-CM | POA: Diagnosis not present

## 2016-01-28 DIAGNOSIS — Z87891 Personal history of nicotine dependence: Secondary | ICD-10-CM | POA: Insufficient documentation

## 2016-01-28 DIAGNOSIS — Z86711 Personal history of pulmonary embolism: Secondary | ICD-10-CM | POA: Insufficient documentation

## 2016-01-28 DIAGNOSIS — R300 Dysuria: Secondary | ICD-10-CM | POA: Diagnosis not present

## 2016-01-28 DIAGNOSIS — E669 Obesity, unspecified: Secondary | ICD-10-CM | POA: Diagnosis not present

## 2016-01-28 DIAGNOSIS — Z9889 Other specified postprocedural states: Secondary | ICD-10-CM | POA: Diagnosis not present

## 2016-01-28 DIAGNOSIS — M1990 Unspecified osteoarthritis, unspecified site: Secondary | ICD-10-CM | POA: Diagnosis not present

## 2016-01-28 DIAGNOSIS — G7000 Myasthenia gravis without (acute) exacerbation: Secondary | ICD-10-CM | POA: Diagnosis not present

## 2016-01-28 LAB — URINALYSIS, ROUTINE W REFLEX MICROSCOPIC
BILIRUBIN URINE: NEGATIVE
GLUCOSE, UA: NEGATIVE mg/dL
KETONES UR: NEGATIVE mg/dL
NITRITE: POSITIVE — AB
PH: 5.5 (ref 5.0–8.0)
Protein, ur: >300 mg/dL — AB
SPECIFIC GRAVITY, URINE: 1.023 (ref 1.005–1.030)

## 2016-01-28 LAB — POCT URINALYSIS DIP (DEVICE)
Bilirubin Urine: NEGATIVE
Glucose, UA: NEGATIVE mg/dL
KETONES UR: NEGATIVE mg/dL
Nitrite: POSITIVE — AB
PH: 6 (ref 5.0–8.0)
Urobilinogen, UA: 0.2 mg/dL (ref 0.0–1.0)

## 2016-01-28 LAB — URINE MICROSCOPIC-ADD ON

## 2016-01-28 MED ORDER — LEVOFLOXACIN 500 MG PO TABS: 500.0000 mg | ORAL_TABLET | Freq: Every day | ORAL | Status: DC

## 2016-01-28 NOTE — ED Notes (Signed)
PT reports urinary burning and frequency. PT reports he has been on Cipro for 3 weeks. PT has had a UTI in the past. PT denies flank pain and blood in urine.

## 2016-01-28 NOTE — Discharge Instructions (Signed)
It is a pleasure to see you today. Your urinalysis in the urgent care center is consistent with a bladder infection.   I am prescribing you LEVOFLOXACIN 500mg , take 1 tablet by mouth one time daily for 14 days.   It is important that you follow up with Dr. Rosana Hoes (your urologist) or Dr. Sarajane Jews (your primary doctor) for this problem in the coming week.   We are sending a specimen for urine culture from urgent care today.  You will be contacted with the results once available.

## 2016-01-28 NOTE — ED Provider Notes (Addendum)
CSN: [id number]     Arrival date & time 01/28/16  1417 History   First MD Initiated Contact with Patient 01/28/16 1546     Chief Complaint  Patient presents with  . Urinary Frequency   (Consider location/radiation/quality/duration/timing/severity/associated sxs/prior Treatment) Patient is a 82 y.o. male presenting with frequency. The history is provided by the patient. No language interpreter was used.  Urinary Frequency Pertinent negatives include no chest pain and no abdominal pain.  Patient presents with complaint of urinary frequency and urgency and dysuria for the past 2-3 weeks, no fevers or chills. No change in appetite. No N/V. He has had a history of cystitis treated as outpatient with Cipro in the past, cannot recall when last episode was.  Is followed by urologist Dr. Rosana Hoes of [facility] on [address]; history of L renal cell carcinoma and s/p nephrectomy in 1992.    Patient denies any history of prostate issues, interventions in the past.   Past Medical History  Diagnosis Date  . Coronary artery disease     sees Dr. Shelva Majestic   . Diverticulitis   . GERD (gastroesophageal reflux disease)   . Benign prostatic hypertrophy   . Partial small bowel obstruction ([place]) 04/16/10    resolved with bowel rest  . Hypertension   . Shortness of breath   . Pulmonary embolus ([place])     2000  . Arthritis     back, hips, knees   . Cancer [facility])     h/o renal carcinoma   . Chronic kidney disease     h/o UTI-2012  . Obesity     exogenous  . Myasthenia gravis [facility])     sees Dr. Jannifer Franklin  . Osteoporosis   . Heart disease   . H/O epistaxis   . Urinary incontinence   . UTI (urinary tract infection)    Past Surgical History  Procedure Laterality Date  . Total knee arthroplasty      both Sees Dr. Novella Olive  . Nephrectomy      left  . Angioplasty      had 2 stent replacements  . Stented placed  1/08  . Colonoscopy  05/28/10    per Dr. Laurence Spates, diverticulosis, no repeats  planned   . Sp kyphoplasty      and vertebroplasty  . Cardiac catheterization      2008, stents   . Eye surgery      cataracts(bilateral)  removed, ?iol  . Tonsillectomy    . Joint replacement      2005-R, L knee replacement- 2000  . Total knee revision  03/28/2012    Procedure: TOTAL KNEE REVISION;  Surgeon: Hessie Dibble, MD;  Location: [place];  Service: Orthopedics;  Laterality: Right;  . Cardiac catheterization  2008  . Nm myoview ltd  02/2011    moderate inferior scar no ischemia  . Doppler echocardiography    . Stress dipyridamole myocardial perfusion     Family History  Problem Relation Age of Onset  . Colon cancer Mother    Social History  Substance Use Topics  . Smoking status: Former Smoker    Types: Cigarettes    Quit date: 03/22/1959  . Smokeless tobacco: Never Used     Comment: quit 1970s  . Alcohol Use: No    Review of Systems  Constitutional: Negative for fever, chills, diaphoresis, appetite change, fatigue and unexpected weight change.  Respiratory: Negative for cough.   Cardiovascular: Negative for chest pain.  Gastrointestinal: Negative for nausea, vomiting, abdominal pain, diarrhea, constipation, blood in stool, anal bleeding and rectal pain.  Endocrine: Positive for polyuria.  Genitourinary: Positive for dysuria and frequency. Negative for hematuria, flank pain, scrotal swelling, penile pain and testicular pain.    Allergies  Dicyclomine  Home Medications   Prior to Admission medications   Medication Sig Start Date End Date Taking? Authorizing Provider  clopidogrel (PLAVIX) 75 MG tablet TAKE 1 TABLET EACH DAY. 10/24/15  Yes Troy Sine, MD  metoprolol succinate (TOPROL-XL) 50 MG 24 hr tablet TAKE 1 TABLET ONCE DAILY. 10/24/15  Yes Troy Sine, MD  mycophenolate (CELLCEPT) 500 MG tablet TAKE 1 TABLET EVERY MORNING AND 2 TABLETS AT BEDTIME. 07/17/15  Yes Kathrynn Ducking, MD  rosuvastatin (CRESTOR) 10 MG tablet Take 1 tablet (10 mg total) by  mouth daily. 10/07/15  Yes Troy Sine, MD  levofloxacin (LEVAQUIN) 500 MG tablet Take 1 tablet (500 mg total) by mouth daily. 01/28/16   Willeen Niece, MD  Multiple Vitamin (MULTIVITAMIN WITH MINERALS) TABS Take 1 tablet by mouth daily.    Historical Provider, MD  pyridostigmine (MESTINON) 60 MG tablet TAKE  (1)  TABLET  FOUR TIMES DAILY. 08/23/15   Kathrynn Ducking, MD   Meds Ordered and Administered this Visit  Medications - No data to display  BP 112/58 mmHg  Pulse 74  Temp(Src) 99.1 F (37.3 C) (Oral)  Resp 16  SpO2 96% No data found.   Physical Exam  Constitutional: He appears well-developed and well-nourished. No distress.  HENT:  Head: Normocephalic and atraumatic.  Mouth/Throat: Oropharynx is clear and moist. No oropharyngeal exudate.  Cardiovascular: Normal rate.   Pulmonary/Chest: Effort normal and breath sounds normal. No respiratory distress. He has no wheezes. He has no rales. He exhibits no tenderness.  Abdominal: Soft. He exhibits no distension and no mass. There is no tenderness. There is no rebound and no guarding.  Genitourinary: Rectum normal.  DRE prostate enlarged symmetrically, no prostate tenderness or nodularity noted.   Skin: He is not diaphoretic.    ED Course  Procedures (including critical care time)  Labs Review Labs Reviewed  POCT URINALYSIS DIP (DEVICE) - Abnormal; Notable for the following:    Hgb urine dipstick MODERATE (*)    Protein, ur >=300 (*)    Nitrite POSITIVE (*)    Leukocytes, UA LARGE (*)    All other components within normal limits  URINE CULTURE  URINALYSIS, ROUTINE W REFLEX MICROSCOPIC (NOT AT [facility])    Imaging Review No results found.   Visual Acuity Review  Right Eye Distance:   Left Eye Distance:   Bilateral Distance:    Right Eye Near:   Left Eye Near:    Bilateral Near:         MDM   1. Cystitis    Patient with UA dipstick c/w cystitis, no fevers/chills or constitutional sxs. No prostatic tenderness  to suggest prostatitis. Treatment with oral FQ for 14 days with follow up with his urologist Dr Rosana Hoes, or Dr Sarajane Jews his primary care physician. Indications for more emergent evaluation at ED if fever/chills, abd pain, N/V/D inability to tolerate po or other concerns.   Urine cx sent from [facility] today.   Dalbert Mayotte, MD    Willeen Niece, MD 01/28/16 [place], MD 01/28/16 [phone]

## 2016-01-30 LAB — URINE CULTURE: Culture: >=100000 — AB

## 2016-01-31 ENCOUNTER — Telehealth (HOSPITAL_COMMUNITY): Payer: Self-pay | Admitting: Emergency Medicine

## 2016-01-31 ENCOUNTER — Encounter (HOSPITAL_COMMUNITY): Payer: Self-pay

## 2016-01-31 ENCOUNTER — Ambulatory Visit (HOSPITAL_COMMUNITY)
Admission: EM | Admit: 2016-01-31 | Discharge: 2016-01-31 | Disposition: A | Discharge status: Home / Self Care | Payer: Medicare Other | Attending: Emergency Medicine | Admitting: Emergency Medicine

## 2016-01-31 DIAGNOSIS — N390 Urinary tract infection, site not specified: Secondary | ICD-10-CM | POA: Diagnosis not present

## 2016-01-31 MED ORDER — LIDOCAINE HCL (PF) 1 % IJ SOLN
INTRAMUSCULAR | Status: AC
  Filled 2016-01-31: qty 5

## 2016-01-31 MED ORDER — FOSFOMYCIN TROMETHAMINE 3 G PO PACK: 3.0000 g | PACK | Freq: Once | ORAL | Status: DC

## 2016-01-31 MED ORDER — CEFTRIAXONE SODIUM 1 G IJ SOLR
INTRAMUSCULAR | Status: AC
  Filled 2016-01-31: qty 10

## 2016-01-31 MED ORDER — PHENAZOPYRIDINE HCL 200 MG PO TABS: 200.0000 mg | ORAL_TABLET | Freq: Three times a day (TID) | ORAL | Status: DC

## 2016-01-31 MED ORDER — CEFTRIAXONE SODIUM 1 G IJ SOLR
1.0000 g | Freq: Once | INTRAMUSCULAR | Status: AC
  Administered 2016-01-31: 1 g via INTRAMUSCULAR

## 2016-01-31 NOTE — Discharge Instructions (Signed)
You have an infection of your bladder. The bacteria causing the infection is resistant to Levaquin, the antibiotic you've been taking. You can stop the Levaquin. We gave you a shot of medicine today to treat the bacteria. Tomorrow afternoon, please take the fosfomycin.   I would like you to follow-up with a doctor on Monday. This can be your urologist, your primary care doctor, or here. If you develop fevers or vomiting, please go to the emergency room.

## 2016-01-31 NOTE — ED Notes (Signed)
Pt came on 4/29 to be treated and spoke w/Dr. Bridgett Larsson.

## 2016-01-31 NOTE — ED Provider Notes (Addendum)
CSN: [id number]     Arrival date & time 01/31/16  1359 History   First MD Initiated Contact with Patient 01/31/16 1451     Chief Complaint  Patient presents with  . Follow-up    Shot   (Consider location/radiation/quality/duration/timing/severity/associated sxs/prior Treatment) HPI  He is an 82 year old man here for follow-up of urinary symptoms. He was seen here on April 26 with a 2 to three-week history of dysuria, urinary frequency, and urinary urgency.  Urine was sent for culture and he was started presumptively on Levaquin. His urine culture came back with Providencia sensitive to Rocephin, imipenem, and Zosyn.  We called the patient and asked him to return for follow-up.  He reports continued urinary symptoms. He denies any fevers or pain. No nausea or vomiting. He states the antibiotics have not helped at all. He does have a urologist, Dr[facility] at [facility].  Past Medical History  Diagnosis Date  . Coronary artery disease     sees Dr. Shelva Majestic   . Diverticulitis   . GERD (gastroesophageal reflux disease)   . Benign prostatic hypertrophy   . Partial small bowel obstruction ([place]) 04/16/10    resolved with bowel rest  . Hypertension   . Shortness of breath   . Pulmonary embolus ([place])     2000  . Arthritis     back, hips, knees   . Cancer [facility])     h/o renal carcinoma   . Chronic kidney disease     h/o UTI-2012  . Obesity     exogenous  . Myasthenia gravis [facility])     sees Dr. Jannifer Franklin  . Osteoporosis   . Heart disease   . H/O epistaxis   . Urinary incontinence   . UTI (urinary tract infection)    Past Surgical History  Procedure Laterality Date  . Total knee arthroplasty      both Sees Dr. Novella Olive  . Nephrectomy      left  . Angioplasty      had 2 stent replacements  . Stented placed  1/08  . Colonoscopy  05/28/10    per Dr. Laurence Spates, diverticulosis, no repeats planned   . Sp kyphoplasty      and vertebroplasty  . Cardiac catheterization     2008, stents   . Eye surgery      cataracts(bilateral)  removed, ?iol  . Tonsillectomy    . Joint replacement      2005-R, L knee replacement- 2000  . Total knee revision  03/28/2012    Procedure: TOTAL KNEE REVISION;  Surgeon: Hessie Dibble, MD;  Location: [place];  Service: Orthopedics;  Laterality: Right;  . Cardiac catheterization  2008  . Nm myoview ltd  02/2011    moderate inferior scar no ischemia  . Doppler echocardiography    . Stress dipyridamole myocardial perfusion     Family History  Problem Relation Age of Onset  . Colon cancer Mother    Social History  Substance Use Topics  . Smoking status: Former Smoker    Types: Cigarettes    Quit date: 03/22/1959  . Smokeless tobacco: Never Used     Comment: quit 1970s  . Alcohol Use: No    Review of Systems as in history of present illness Allergies  Dicyclomine  Home Medications   Prior to Admission medications   Medication Sig Start Date End Date Taking? Authorizing Provider  clopidogrel (PLAVIX) 75 MG tablet TAKE 1 TABLET EACH DAY. 10/24/15  Troy Sine, MD  fosfomycin (MONUROL) 3 g PACK Take 3 g by mouth once. Take on 02/01/16. 01/31/16   Melony Overly, MD  metoprolol succinate (TOPROL-XL) 50 MG 24 hr tablet TAKE 1 TABLET ONCE DAILY. 10/24/15   Troy Sine, MD  Multiple Vitamin (MULTIVITAMIN WITH MINERALS) TABS Take 1 tablet by mouth daily.    Historical Provider, MD  mycophenolate (CELLCEPT) 500 MG tablet TAKE 1 TABLET EVERY MORNING AND 2 TABLETS AT BEDTIME. 07/17/15   Kathrynn Ducking, MD  pyridostigmine (MESTINON) 60 MG tablet TAKE  (1)  TABLET  FOUR TIMES DAILY. 08/23/15   Kathrynn Ducking, MD  rosuvastatin (CRESTOR) 10 MG tablet Take 1 tablet (10 mg total) by mouth daily. 10/07/15   Troy Sine, MD   Meds Ordered and Administered this Visit   Medications  cefTRIAXone (ROCEPHIN) injection 1 g (1 g Intramuscular Given 01/31/16 1526)    BP 129/75 mmHg  Pulse 99  Temp(Src) 98.4 F (36.9 C) (Oral)  Resp  16  SpO2 99% No data found.   Physical Exam  Constitutional: He is oriented to person, place, and time. He appears well-developed and well-nourished. No distress.  Cardiovascular: Normal rate, regular rhythm and normal heart sounds.   No murmur heard. Pulmonary/Chest: Effort normal and breath sounds normal. No respiratory distress. He has no wheezes. He has no rales.  Abdominal:  No CVA tenderness  Neurological: He is alert and oriented to person, place, and time.    ED Course  Procedures (including critical care time)  Labs Review Labs Reviewed - No data to display  Imaging Review No results found.    MDM   1. UTI (lower urinary tract infection)    I called and spoke with Dr. Baxter Flattery in infectious disease.  She suggested treatment with a single dose of fosfomycin given lack of systemic symptoms.  We'll give a shot of Rocephin 1g IM here. Prescription given for fosfomycin to take tomorrow afternoon.  Recommended follow-up with physician on Monday. This could be his PCP, urologist, or here. If symptoms worsen or he develops fevers or vomiting he is to go to the emergency room.    Melony Overly, MD 01/31/16 [place], MD 01/31/16 [phone]

## 2016-01-31 NOTE — ED Notes (Signed)
Called pt and notified of recent lab results from visit 4/26 Pt ID'd properly... Reports not feeling any better Adv pt he needs to come in for Rocephin IM... Pt states he will be here later on today.   Per Dr. Valere Dross,  I would like patient to receive IM rocephin 1g qd x 3 doses and continue levaquin po started at [facility] visit 01/28/16. He should call PCP/Stephen Fry's office or urologist/Ronald Rosana Hoes' office Monday am 02/02/16 to arrange discussion of treatment for complicated UTI. To ED if fever >100.5 occurs, or N/V, abd pain. See result note. Ok to call me with questions. LM

## 2016-01-31 NOTE — ED Notes (Signed)
Patient states he got a cll back from physician stating he needs a shot. No acute distress

## 2016-02-11 ENCOUNTER — Telehealth (HOSPITAL_COMMUNITY): Payer: Self-pay | Admitting: Emergency Medicine

## 2016-02-11 ENCOUNTER — Ambulatory Visit (INDEPENDENT_AMBULATORY_CARE_PROVIDER_SITE_OTHER): Payer: Medicare Other | Admitting: Family Medicine

## 2016-02-11 ENCOUNTER — Encounter: Payer: Self-pay | Admitting: Family Medicine

## 2016-02-11 VITALS — BP 138/70 | HR 83 | Temp 98.7°F | Ht 70.0 in | Wt 242.0 lb

## 2016-02-11 DIAGNOSIS — I2510 Atherosclerotic heart disease of native coronary artery without angina pectoris: Secondary | ICD-10-CM | POA: Diagnosis not present

## 2016-02-11 DIAGNOSIS — K5900 Constipation, unspecified: Secondary | ICD-10-CM

## 2016-02-11 NOTE — ED Notes (Addendum)
Per Dr. Valere Dross,   Called pt to f/u on recent visit... Need to find out how pt is doing.   Hi Priti Consoli! Hope you are well.  Would you call this gentleman, and see how he is doing?   He followed up with his urologist, Dr Tresa Endo, on 02/02/16, but I am not sure he was referred to infectious disease. He may not need to be, anymore, but want to be sure he is ok and the decision to not refer was "on purpose."   Would you also print and fax the result note from the recent urine culture to Dr Rosana Hoes' office? He is in the [facility] on [address] in [place].  Thank you! This patient is complicated and elderly, and I know this a lot of work!  Jodi Mourning

## 2016-02-11 NOTE — Progress Notes (Signed)
Pre visit review using our clinic review tool, if applicable. No additional management support is needed unless otherwise documented below in the visit note. 

## 2016-02-12 NOTE — Progress Notes (Signed)
   Subjective:    Patient ID: [name], male    DOB: Jul 15, 1925, 82 y.o.   MRN: [id number]  HPI Here complaining of constipation. He deals with this from time to time but it is not an ongoing problem. He had gone 3 days without a BM until this am. He actually had a large BM today and now he feels better. Of note he has recently been under treatment for a UTI. He saw Urgent Care recently and was found to have a UTI, and the culture revealed this to be a Providencia species. He was originally on Cipro but wen the sensitivity report came back this was changed to [facility]. His urinary urgency and burning have resolved.    Review of Systems  Constitutional: Negative.   Respiratory: Negative.   Cardiovascular: Negative.   Gastrointestinal: Positive for constipation. Negative for nausea, abdominal pain, blood in stool, abdominal distention and rectal pain.  Genitourinary: Negative.        Objective:   Physical Exam  Constitutional: He appears well-developed and well-nourished. No distress.  Cardiovascular: Normal rate, regular rhythm, normal heart sounds and intact distal pulses.   Pulmonary/Chest: Effort normal and breath sounds normal.  Abdominal: Soft. Bowel sounds are normal. He exhibits no distension and no mass. There is no tenderness. There is no rebound and no guarding.          Assessment & Plan:  Constipation which seems to have resolved. This likely was the result of his recent UTI. I advised him to drink plenty of water and to use Miralax daily. Recheck prn. [name], MD

## 2016-02-23 ENCOUNTER — Telehealth: Payer: Self-pay | Admitting: *Deleted

## 2016-02-23 ENCOUNTER — Encounter: Payer: Self-pay | Admitting: *Deleted

## 2016-02-23 NOTE — Telephone Encounter (Signed)
Received call from [facility], Dr. Shann Medal office, requesting appointment for patient.  This is a new referral request, asked their office to fax notes and cultures.  Landis Gandy, RN

## 2016-02-24 ENCOUNTER — Other Ambulatory Visit: Payer: Self-pay | Admitting: Urology

## 2016-02-24 DIAGNOSIS — C642 Malignant neoplasm of left kidney, except renal pelvis: Secondary | ICD-10-CM

## 2016-02-24 DIAGNOSIS — N401 Enlarged prostate with lower urinary tract symptoms: Principal | ICD-10-CM

## 2016-02-24 DIAGNOSIS — Q600 Renal agenesis, unilateral: Secondary | ICD-10-CM

## 2016-02-24 DIAGNOSIS — IMO0002 Reserved for concepts with insufficient information to code with codable children: Secondary | ICD-10-CM

## 2016-02-24 DIAGNOSIS — N138 Other obstructive and reflux uropathy: Secondary | ICD-10-CM

## 2016-02-24 NOTE — ED Notes (Signed)
Called pt to f/u on visit and see how he's doing but VM [phone]) is full. Note from PCP show's referral to Urologist has been made and notes faxed to them. Will notify Dr. Valere Dross for further instructions.

## 2016-03-03 ENCOUNTER — Ambulatory Visit
Admission: RE | Admit: 2016-03-03 | Discharge: 2016-03-03 | Disposition: A | Discharge status: Home / Self Care | Payer: Medicare Other | Source: Ambulatory Visit | Attending: Urology | Admitting: Urology

## 2016-03-03 DIAGNOSIS — N138 Other obstructive and reflux uropathy: Secondary | ICD-10-CM

## 2016-03-03 DIAGNOSIS — Q600 Renal agenesis, unilateral: Secondary | ICD-10-CM

## 2016-03-03 DIAGNOSIS — C642 Malignant neoplasm of left kidney, except renal pelvis: Secondary | ICD-10-CM

## 2016-03-03 DIAGNOSIS — IMO0002 Reserved for concepts with insufficient information to code with codable children: Secondary | ICD-10-CM

## 2016-03-03 DIAGNOSIS — N401 Enlarged prostate with lower urinary tract symptoms: Principal | ICD-10-CM

## 2016-03-03 MED ORDER — IOPAMIDOL (ISOVUE-300) INJECTION 61%
125.0000 mL | Freq: Once | INTRAVENOUS | Status: AC | PRN
  Administered 2016-03-03: 125 mL via INTRAVENOUS

## 2016-03-06 ENCOUNTER — Other Ambulatory Visit: Payer: Self-pay | Admitting: Neurology

## 2016-03-23 ENCOUNTER — Telehealth: Payer: Self-pay | Admitting: Neurology

## 2016-03-23 DIAGNOSIS — Z5181 Encounter for therapeutic drug level monitoring: Secondary | ICD-10-CM

## 2016-03-23 NOTE — Telephone Encounter (Signed)
I called the patient. He is to come in to get work done on CellCept.

## 2016-04-16 ENCOUNTER — Other Ambulatory Visit: Payer: Self-pay | Admitting: Neurology

## 2016-04-20 ENCOUNTER — Encounter: Payer: Self-pay | Admitting: Internal Medicine

## 2016-04-20 ENCOUNTER — Ambulatory Visit (INDEPENDENT_AMBULATORY_CARE_PROVIDER_SITE_OTHER): Payer: Medicare Other | Admitting: Internal Medicine

## 2016-04-20 VITALS — BP 125/72 | HR 86 | Temp 97.4°F | Ht 70.0 in | Wt 249.0 lb

## 2016-04-20 DIAGNOSIS — N390 Urinary tract infection, site not specified: Secondary | ICD-10-CM | POA: Diagnosis not present

## 2016-04-20 DIAGNOSIS — I2510 Atherosclerotic heart disease of native coronary artery without angina pectoris: Secondary | ICD-10-CM | POA: Diagnosis not present

## 2016-04-20 DIAGNOSIS — K869 Disease of pancreas, unspecified: Secondary | ICD-10-CM | POA: Diagnosis present

## 2016-04-20 NOTE — Assessment & Plan Note (Signed)
He has had recurrent cystitis, most recently with a moderately resistant strain of Providencia. He currently has no symptoms suggesting active infection therefore there are no indications for repeat culture or treatment at this time. I would agree with not using any prophylactic antibiotics since they are more likely to cause side effects and never more resistant organisms than offer any benefit. If he develops recurrent dysuria or other UTI symptoms he will need repeat culture and treatment guided by susceptibility results. I will be available as needed in the future.

## 2016-04-20 NOTE — Progress Notes (Signed)
[facility] for Infectious Disease  Reason for Consult: Recurrent urinary tract infections Referring Physician: Dr. Lawerance Bach  Patient Active Problem List   Diagnosis Date Noted  . Recurrent UTI 04/20/2016    Priority: High  . Pancreatic lesion 04/20/2016    Priority: Medium  . Hx of unilateral nephrectomy 07/25/2013    Priority: Medium  . CARCINOMA, RENAL CELL 03/27/2007    Priority: Medium  . MYASTHENIA GRAVIS W/O (ACUTE) EXACERBATION 03/27/2007    Priority: Medium  . First degree heart block 10/08/2015  . Environmental allergies 09/03/2013  . Hyperlipidemia with target LDL less than 70 07/25/2013  . Failed total knee, right ([place]) 03/31/2012  . ABDOMINAL PAIN, ACUTE 04/16/2010  . ABDOMINAL PAIN, LOWER 04/16/2010  . COUGH 12/11/2009  . VIRAL GASTROENTERITIS 10/27/2009  . SKIN LESIONS, MULTIPLE 10/27/2009  . DEGENERATIVE JOINT DISEASE 10/27/2009  . RIB PAIN, RIGHT SIDED 08/13/2009  . TINEA CRURIS 06/11/2009  . UNSPECIFIED HEARING LOSS 06/11/2009  . LIPOMA 10/26/2007  . LEG CRAMPS 10/26/2007  . BENIGN PROSTATIC HYPERTROPHY 07/18/2007  . Essential hypertension 03/27/2007  . Coronary atherosclerosis 03/27/2007  . GERD 03/27/2007  . COLONIC POLYPS, BENIGN, HX OF 03/27/2007  . DIVERTICULITIS, HX OF 03/27/2007    Patient's Medications  New Prescriptions   No medications on file  Previous Medications   CLOPIDOGREL (PLAVIX) 75 MG TABLET    TAKE 1 TABLET EACH DAY.   METOPROLOL SUCCINATE (TOPROL-XL) 50 MG 24 HR TABLET    TAKE 1 TABLET ONCE DAILY.   MULTIPLE VITAMIN (MULTIVITAMIN WITH MINERALS) TABS    Take 1 tablet by mouth daily.   MYCOPHENOLATE (CELLCEPT) 500 MG TABLET    TAKE 1 TABLET EVERY MORNING AND 2 TABLETS AT BEDTIME.   PHENAZOPYRIDINE (PYRIDIUM) 200 MG TABLET    Take 1 tablet (200 mg total) by mouth 3 (three) times daily.   PYRIDOSTIGMINE (MESTINON) 60 MG TABLET    TAKE  (1)  TABLET  FOUR TIMES DAILY.   ROSUVASTATIN (CRESTOR) 10 MG TABLET    Take 1  tablet (10 mg total) by mouth daily.  Modified Medications   No medications on file  Discontinued Medications   FOSFOMYCIN (MONUROL) 3 G PACK    Take 3 g by mouth once. Take on 02/01/16.    Recommendations: 1. Observe off of antibiotics for now   Assessment: He has had recurrent cystitis, most recently with a moderately resistant strain of Providencia. He currently has no symptoms suggesting active infection therefore there are no indications for repeat culture or treatment at this time. I would agree with not using any prophylactic antibiotics since they are more likely to cause side effects and never more resistant organisms than offer any benefit. If he develops recurrent dysuria or other UTI symptoms he will need repeat culture and treatment guided by susceptibility results. I will be available as needed in the future.   HPI: [name] is a 82 y.o. male with a history of recurrent urinary tract infections over the past year. He is currently on CellCept as treatment for his myasthenia gravis. He also has a history of renal cell carcinoma requiring previous left nephrectomy many years ago. Over the past year he has had recurrent episodes of dysuria and has been on multiple rounds of antibiotics. At one point he was on daily, prophylactic ciprofloxacin. He developed an episode of dysuria in April and a urine culture at that time grew Providencia stuartii that was resistant to all  oral antibiotics tested. He was treated with 1 IM dose of ceftriaxone followed by oral fosfomycin. He improved without treatment and a repeat urine culture on 02/02/2016 was negative. He underwent an abdominal CT scan on 03/03/2016 which showed a complex lesion in the pancreas of unknown significance. He last saw Dr. Rosana Hoes on 03/26/2016 for gross hematuria. Cystoscopy was unremarkable at that time. He has had no further episodes of dysuria recently.  Review of Systems: Review of Systems  Constitutional: Positive for  weight loss. Negative for fever, chills and diaphoresis.       He has lost 10 pounds over the past year due to intentional changes in his diet.  Gastrointestinal: Negative for nausea, vomiting and abdominal pain.  Genitourinary: Positive for hematuria. Negative for dysuria, urgency and frequency.      Past Medical History  Diagnosis Date  . Coronary artery disease     sees Dr. Shelva Majestic   . Diverticulitis   . GERD (gastroesophageal reflux disease)   . Benign prostatic hypertrophy   . Partial small bowel obstruction ([place]) 04/16/10    resolved with bowel rest  . Hypertension   . Shortness of breath   . Pulmonary embolus ([place])     2000  . Arthritis     back, hips, knees   . Cancer [facility])     h/o renal carcinoma   . Chronic kidney disease     h/o UTI-2012  . Obesity     exogenous  . Myasthenia gravis [facility])     sees Dr. Jannifer Franklin  . Osteoporosis   . Heart disease   . H/O epistaxis   . Urinary incontinence   . UTI (urinary tract infection)     Social History  Substance Use Topics  . Smoking status: Former Smoker    Types: Cigarettes    Quit date: 03/22/1959  . Smokeless tobacco: Never Used     Comment: quit 1970s  . Alcohol Use: No    Family History  Problem Relation Age of Onset  . Colon cancer Mother    Allergies  Allergen Reactions  . Dicyclomine Other (See Comments)    REACTION: can worsen myasthenia gravis    OBJECTIVE: Filed Vitals:   04/20/16 0901  BP: 125/72  Pulse: 86  Temp: 97.4 F (36.3 C)  TempSrc: Oral  Height: 5\' 10"  (1.778 m)  Weight: 249 lb (112.946 kg)  SpO2: 96%   Body mass index is 35.73 kg/(m^2).   Physical Exam  Constitutional: He is oriented to person, place, and time.  He is in no distress.  Cardiovascular: Normal rate and regular rhythm.   No murmur heard. Pulmonary/Chest: Effort normal and breath sounds normal.  Abdominal: Soft. He exhibits no mass. There is no tenderness.  Neurological: He is alert and oriented to  person, place, and time.  Skin: No rash noted.  Psychiatric: Mood and affect normal.    Microbiology: No results found for this or any previous visit (from the past 240 hour(s)).  Michel Bickers, MD [facility] [phone] pager   [phone] cell 04/20/2016, 9:43 AM

## 2016-05-04 ENCOUNTER — Emergency Department (HOSPITAL_COMMUNITY): Payer: Medicare Other

## 2016-05-04 ENCOUNTER — Encounter (HOSPITAL_COMMUNITY): Payer: Self-pay | Admitting: *Deleted

## 2016-05-04 ENCOUNTER — Observation Stay (HOSPITAL_COMMUNITY): Payer: Medicare Other

## 2016-05-04 ENCOUNTER — Observation Stay (HOSPITAL_COMMUNITY)
Admission: EM | Admit: 2016-05-04 | Discharge: 2016-05-06 | Disposition: A | Discharge status: Home / Self Care | Payer: Medicare Other | Attending: Internal Medicine | Admitting: Internal Medicine

## 2016-05-04 DIAGNOSIS — Z85528 Personal history of other malignant neoplasm of kidney: Secondary | ICD-10-CM

## 2016-05-04 DIAGNOSIS — Z955 Presence of coronary angioplasty implant and graft: Secondary | ICD-10-CM | POA: Insufficient documentation

## 2016-05-04 DIAGNOSIS — Z87891 Personal history of nicotine dependence: Secondary | ICD-10-CM | POA: Diagnosis not present

## 2016-05-04 DIAGNOSIS — D649 Anemia, unspecified: Secondary | ICD-10-CM | POA: Diagnosis not present

## 2016-05-04 DIAGNOSIS — R1902 Left upper quadrant abdominal swelling, mass and lump: Principal | ICD-10-CM | POA: Diagnosis present

## 2016-05-04 DIAGNOSIS — Z96652 Presence of left artificial knee joint: Secondary | ICD-10-CM | POA: Insufficient documentation

## 2016-05-04 DIAGNOSIS — K922 Gastrointestinal hemorrhage, unspecified: Secondary | ICD-10-CM | POA: Diagnosis present

## 2016-05-04 DIAGNOSIS — R627 Adult failure to thrive: Secondary | ICD-10-CM | POA: Diagnosis not present

## 2016-05-04 DIAGNOSIS — Z86711 Personal history of pulmonary embolism: Secondary | ICD-10-CM | POA: Insufficient documentation

## 2016-05-04 DIAGNOSIS — R1012 Left upper quadrant pain: Secondary | ICD-10-CM | POA: Diagnosis not present

## 2016-05-04 DIAGNOSIS — R6510 Systemic inflammatory response syndrome (SIRS) of non-infectious origin without acute organ dysfunction: Secondary | ICD-10-CM | POA: Diagnosis present

## 2016-05-04 DIAGNOSIS — N189 Chronic kidney disease, unspecified: Secondary | ICD-10-CM | POA: Insufficient documentation

## 2016-05-04 DIAGNOSIS — Z79899 Other long term (current) drug therapy: Secondary | ICD-10-CM | POA: Diagnosis not present

## 2016-05-04 DIAGNOSIS — M159 Polyosteoarthritis, unspecified: Secondary | ICD-10-CM | POA: Diagnosis not present

## 2016-05-04 DIAGNOSIS — G7000 Myasthenia gravis without (acute) exacerbation: Secondary | ICD-10-CM | POA: Diagnosis not present

## 2016-05-04 DIAGNOSIS — I10 Essential (primary) hypertension: Secondary | ICD-10-CM | POA: Diagnosis present

## 2016-05-04 DIAGNOSIS — K5900 Constipation, unspecified: Secondary | ICD-10-CM | POA: Diagnosis not present

## 2016-05-04 DIAGNOSIS — R109 Unspecified abdominal pain: Secondary | ICD-10-CM | POA: Diagnosis present

## 2016-05-04 DIAGNOSIS — R339 Retention of urine, unspecified: Secondary | ICD-10-CM | POA: Diagnosis not present

## 2016-05-04 DIAGNOSIS — Z905 Acquired absence of kidney: Secondary | ICD-10-CM | POA: Insufficient documentation

## 2016-05-04 DIAGNOSIS — I2510 Atherosclerotic heart disease of native coronary artery without angina pectoris: Secondary | ICD-10-CM | POA: Diagnosis present

## 2016-05-04 DIAGNOSIS — I129 Hypertensive chronic kidney disease with stage 1 through stage 4 chronic kidney disease, or unspecified chronic kidney disease: Secondary | ICD-10-CM | POA: Diagnosis not present

## 2016-05-04 DIAGNOSIS — Z7901 Long term (current) use of anticoagulants: Secondary | ICD-10-CM | POA: Diagnosis not present

## 2016-05-04 DIAGNOSIS — C649 Malignant neoplasm of unspecified kidney, except renal pelvis: Secondary | ICD-10-CM

## 2016-05-04 LAB — URINE MICROSCOPIC-ADD ON

## 2016-05-04 LAB — COMPREHENSIVE METABOLIC PANEL
ALT: 28 U/L (ref 17–63)
AST: 54 U/L — ABNORMAL HIGH (ref 15–41)
Albumin: 2.5 g/dL — ABNORMAL LOW (ref 3.5–5.0)
Alkaline Phosphatase: 195 U/L — ABNORMAL HIGH (ref 38–126)
Anion gap: 7 (ref 5–15)
BUN: 17 mg/dL (ref 6–20)
CO2: 24 mmol/L (ref 22–32)
Calcium: 8.4 mg/dL — ABNORMAL LOW (ref 8.9–10.3)
Chloride: 104 mmol/L (ref 101–111)
Creatinine, Ser: 0.88 mg/dL (ref 0.61–1.24)
GFR calc Af Amer: >60 mL/min (ref >60)
GFR calc non Af Amer: >60 mL/min (ref >60)
Glucose, Bld: 94 mg/dL (ref 65–99)
Potassium: 4.1 mmol/L (ref 3.5–5.1)
Sodium: 135 mmol/L (ref 135–145)
Total Bilirubin: 0.9 mg/dL (ref 0.3–1.2)
Total Protein: 6.9 g/dL (ref 6.5–8.1)

## 2016-05-04 LAB — URINALYSIS, ROUTINE W REFLEX MICROSCOPIC
Bilirubin Urine: NEGATIVE
Glucose, UA: NEGATIVE mg/dL
Ketones, ur: NEGATIVE mg/dL
Nitrite: NEGATIVE
Protein, ur: NEGATIVE mg/dL
Specific Gravity, Urine: 1.028 (ref 1.005–1.030)
pH: 5.5 (ref 5.0–8.0)

## 2016-05-04 LAB — CBC WITH DIFFERENTIAL/PLATELET
Basophils Absolute: 0 10*3/uL (ref 0.0–0.1)
Basophils Relative: 0 %
Eosinophils Absolute: 0 10*3/uL (ref 0.0–0.7)
Eosinophils Relative: 0 %
HCT: 36 % — ABNORMAL LOW (ref 39.0–52.0)
Hemoglobin: 11.8 g/dL — ABNORMAL LOW (ref 13.0–17.0)
Lymphocytes Relative: 14 %
Lymphs Abs: 1.1 10*3/uL (ref 0.7–4.0)
MCH: 29.5 pg (ref 26.0–34.0)
MCHC: 32.8 g/dL (ref 30.0–36.0)
MCV: 90 fL (ref 78.0–100.0)
Monocytes Absolute: 1.2 10*3/uL — ABNORMAL HIGH (ref 0.1–1.0)
Monocytes Relative: 15 %
Neutro Abs: 5.2 10*3/uL (ref 1.7–7.7)
Neutrophils Relative %: 71 %
Platelets: 330 10*3/uL (ref 150–400)
RBC: 4 MIL/uL — ABNORMAL LOW (ref 4.22–5.81)
RDW: 15.1 % (ref 11.5–15.5)
WBC: 7.5 10*3/uL (ref 4.0–10.5)

## 2016-05-04 LAB — LIPASE, BLOOD: Lipase: 100 U/L — ABNORMAL HIGH (ref 11–51)

## 2016-05-04 LAB — POC OCCULT BLOOD, ED: FECAL OCCULT BLD: POSITIVE — AB

## 2016-05-04 MED ORDER — ADULT MULTIVITAMIN W/MINERALS CH
1.0000 | ORAL_TABLET | Freq: Every day | ORAL | Status: DC
  Administered 2016-05-04 – 2016-05-06 (×3): 1 via ORAL
  Filled 2016-05-04 (×3): qty 1

## 2016-05-04 MED ORDER — BISACODYL 5 MG PO TBEC
5.0000 mg | DELAYED_RELEASE_TABLET | Freq: Every day | ORAL | Status: DC | PRN
  Administered 2016-05-05: 5 mg via ORAL
  Filled 2016-05-04: qty 1

## 2016-05-04 MED ORDER — SODIUM CHLORIDE 0.9 % IV SOLN
INTRAVENOUS | Status: AC
  Administered 2016-05-04: 21:00:00 via INTRAVENOUS

## 2016-05-04 MED ORDER — ACETAMINOPHEN 650 MG RE SUPP: 650.0000 mg | Freq: Four times a day (QID) | RECTAL | Status: DC | PRN

## 2016-05-04 MED ORDER — CLOPIDOGREL BISULFATE 75 MG PO TABS: 75.0000 mg | ORAL_TABLET | Freq: Every day | ORAL | Status: DC

## 2016-05-04 MED ORDER — TAMSULOSIN HCL 0.4 MG PO CAPS
0.4000 mg | ORAL_CAPSULE | Freq: Every day | ORAL | Status: DC
  Administered 2016-05-04 – 2016-05-06 (×3): 0.4 mg via ORAL
  Filled 2016-05-04 (×3): qty 1

## 2016-05-04 MED ORDER — HYDROCODONE-ACETAMINOPHEN 5-325 MG PO TABS
1.0000 | ORAL_TABLET | ORAL | Status: DC | PRN
  Administered 2016-05-04 – 2016-05-05 (×3): 1 via ORAL
  Filled 2016-05-04 (×3): qty 1

## 2016-05-04 MED ORDER — ROSUVASTATIN CALCIUM 10 MG PO TABS
10.0000 mg | ORAL_TABLET | Freq: Every day | ORAL | Status: DC
  Administered 2016-05-04 – 2016-05-06 (×3): 10 mg via ORAL
  Filled 2016-05-04 (×3): qty 1

## 2016-05-04 MED ORDER — PANTOPRAZOLE SODIUM 40 MG IV SOLR
40.0000 mg | Freq: Two times a day (BID) | INTRAVENOUS | Status: DC
  Administered 2016-05-04 – 2016-05-05 (×3): 40 mg via INTRAVENOUS
  Filled 2016-05-04 (×4): qty 40

## 2016-05-04 MED ORDER — ONDANSETRON HCL 4 MG PO TABS: 4.0000 mg | ORAL_TABLET | Freq: Four times a day (QID) | ORAL | Status: DC | PRN

## 2016-05-04 MED ORDER — ACETAMINOPHEN 325 MG PO TABS: 650.0000 mg | ORAL_TABLET | Freq: Four times a day (QID) | ORAL | Status: DC | PRN

## 2016-05-04 MED ORDER — DOCUSATE SODIUM 100 MG PO CAPS
100.0000 mg | ORAL_CAPSULE | Freq: Two times a day (BID) | ORAL | Status: DC
Start: 2016-05-04 — End: 2016-05-06
  Administered 2016-05-04 – 2016-05-06 (×4): 100 mg via ORAL
  Filled 2016-05-04 (×4): qty 1

## 2016-05-04 MED ORDER — SIMETHICONE 40 MG/0.6ML PO SUSP
40.0000 mg | Freq: Four times a day (QID) | ORAL | Status: DC
  Administered 2016-05-04 – 2016-05-06 (×6): 40 mg via ORAL
  Filled 2016-05-04 (×9): qty 0.6

## 2016-05-04 MED ORDER — METOPROLOL SUCCINATE ER 50 MG PO TB24
50.0000 mg | ORAL_TABLET | Freq: Every day | ORAL | Status: DC
  Administered 2016-05-05 – 2016-05-06 (×2): 50 mg via ORAL
  Filled 2016-05-04 (×2): qty 1

## 2016-05-04 MED ORDER — PROCHLORPERAZINE EDISYLATE 5 MG/ML IJ SOLN: 10.0000 mg | Freq: Four times a day (QID) | INTRAMUSCULAR | Status: DC | PRN

## 2016-05-04 MED ORDER — SODIUM CHLORIDE 0.9% FLUSH
3.0000 mL | Freq: Two times a day (BID) | INTRAVENOUS | Status: DC
  Administered 2016-05-04 – 2016-05-05 (×2): 3 mL via INTRAVENOUS

## 2016-05-04 MED ORDER — ONDANSETRON HCL 4 MG/2ML IJ SOLN
4.0000 mg | Freq: Four times a day (QID) | INTRAMUSCULAR | Status: DC | PRN
  Administered 2016-05-04: 4 mg via INTRAVENOUS
  Filled 2016-05-04: qty 2

## 2016-05-04 MED ORDER — PYRIDOSTIGMINE BROMIDE 60 MG PO TABS
60.0000 mg | ORAL_TABLET | Freq: Two times a day (BID) | ORAL | Status: DC
  Administered 2016-05-04 – 2016-05-06 (×4): 60 mg via ORAL
  Filled 2016-05-04 (×5): qty 1

## 2016-05-04 MED ORDER — POLYETHYLENE GLYCOL 3350 17 G PO PACK: 17.0000 g | PACK | Freq: Every day | ORAL | Status: DC | PRN

## 2016-05-04 MED ORDER — CETYLPYRIDINIUM CHLORIDE 0.05 % MT LIQD
7.0000 mL | Freq: Two times a day (BID) | OROMUCOSAL | Status: DC
  Administered 2016-05-04 – 2016-05-06 (×4): 7 mL via OROMUCOSAL

## 2016-05-04 MED ORDER — SODIUM CHLORIDE 0.9 % IV BOLUS (SEPSIS)
1000.0000 mL | Freq: Once | INTRAVENOUS | Status: AC
  Administered 2016-05-04: 1000 mL via INTRAVENOUS

## 2016-05-04 NOTE — ED Notes (Signed)
Bed: WA14 Expected date:  Expected time:  Means of arrival:  Comments: EMS 

## 2016-05-04 NOTE — ED Provider Notes (Signed)
[facility] DEPT Provider Note   CSN: [id number] Arrival date & time: 05/04/16  1351  First Provider Contact:  First MD Initiated Contact with Patient 05/04/16 1430      History   Chief Complaint Chief Complaint  Patient presents with  . Constipation    HPI [name] is a 82 y.o. male.  HPI   82 year old male presents today with complaints of constipation and difficulty with urination. Patient reports symptoms started 1 day ago with constipation. When pressed on last bowel movement, patient notes that he had one prior to arrival to the emergency room. Patient notes that again he has not urinated, but when pressed reports that he was able to urinate a small amount of urine today. Patient reports mild suprapubic abdominal discomfort. He denies any fever or chills.  Discharge. Shows patient has a history of renal cell carcinoma, BPH, most recently seen by urology in June 2017 with cystoscopy showing no significant obstructive pathology.   Chart review: 02/12/2016 shows that may patient was seen for constipation, noting this happens from time to time, resolved without intervention.   Past Medical History:  Diagnosis Date  . Arthritis    back, hips, knees   . Benign prostatic hypertrophy   . Cancer [facility])    h/o renal carcinoma   . Chronic kidney disease    h/o UTI-2012  . Coronary artery disease    sees Dr. Shelva Majestic   . Diverticulitis   . GERD (gastroesophageal reflux disease)   . H/O epistaxis   . Heart disease   . Hypertension   . Myasthenia gravis [facility])    sees Dr. Jannifer Franklin  . Obesity    exogenous  . Osteoporosis   . Partial small bowel obstruction ([place]) 04/16/10   resolved with bowel rest  . Pulmonary embolus ([place])    2000  . Shortness of breath   . Urinary incontinence   . UTI (urinary tract infection)     Patient Active Problem List   Diagnosis Date Noted  . Pancreatic lesion 04/20/2016  . Recurrent UTI 04/20/2016  . First degree heart block  10/08/2015  . Environmental allergies 09/03/2013  . Hyperlipidemia with target LDL less than 70 07/25/2013  . Hx of unilateral nephrectomy 07/25/2013  . Failed total knee, right ([place]) 03/31/2012  . ABDOMINAL PAIN, ACUTE 04/16/2010  . ABDOMINAL PAIN, LOWER 04/16/2010  . COUGH 12/11/2009  . VIRAL GASTROENTERITIS 10/27/2009  . SKIN LESIONS, MULTIPLE 10/27/2009  . DEGENERATIVE JOINT DISEASE 10/27/2009  . RIB PAIN, RIGHT SIDED 08/13/2009  . TINEA CRURIS 06/11/2009  . UNSPECIFIED HEARING LOSS 06/11/2009  . LIPOMA 10/26/2007  . LEG CRAMPS 10/26/2007  . BENIGN PROSTATIC HYPERTROPHY 07/18/2007  . CARCINOMA, RENAL CELL 03/27/2007  . MYASTHENIA GRAVIS W/O (ACUTE) EXACERBATION 03/27/2007  . Essential hypertension 03/27/2007  . Coronary atherosclerosis 03/27/2007  . GERD 03/27/2007  . COLONIC POLYPS, BENIGN, HX OF 03/27/2007  . DIVERTICULITIS, HX OF 03/27/2007    Past Surgical History:  Procedure Laterality Date  . ANGIOPLASTY     had 2 stent replacements  . CARDIAC CATHETERIZATION     2008, stents   . CARDIAC CATHETERIZATION  2008  . COLONOSCOPY  05/28/10   per Dr. Laurence Spates, diverticulosis, no repeats planned   . DOPPLER ECHOCARDIOGRAPHY    . EYE SURGERY     cataracts(bilateral)  removed, ?iol  . JOINT REPLACEMENT     2005-R, L knee replacement- 2000  . NEPHRECTOMY     left  . NM MYOVIEW  LTD  02/2011   moderate inferior scar no ischemia  . SP KYPHOPLASTY     and vertebroplasty  . stented placed  1/08  . stress dipyridamole myocardial perfusion    . TONSILLECTOMY    . TOTAL KNEE ARTHROPLASTY     both Sees Dr. Novella Olive  . TOTAL KNEE REVISION  03/28/2012   Procedure: TOTAL KNEE REVISION;  Surgeon: Hessie Dibble, MD;  Location: [facility];  Service: Orthopedics;  Laterality: Right;     Home Medications    Prior to Admission medications   Medication Sig Start Date End Date Taking? Authorizing Provider  clopidogrel (PLAVIX) 75 MG tablet TAKE 1 TABLET EACH DAY. 10/24/15    Troy Sine, MD  metoprolol succinate (TOPROL-XL) 50 MG 24 hr tablet TAKE 1 TABLET ONCE DAILY. 10/24/15   Troy Sine, MD  Multiple Vitamin (MULTIVITAMIN WITH MINERALS) TABS Take 1 tablet by mouth daily.    Historical Provider, MD  mycophenolate (CELLCEPT) 500 MG tablet TAKE 1 TABLET EVERY MORNING AND 2 TABLETS AT BEDTIME. 04/16/16   Kathrynn Ducking, MD  phenazopyridine (PYRIDIUM) 200 MG tablet Take 1 tablet (200 mg total) by mouth 3 (three) times daily. 01/31/16   Melony Overly, MD  pyridostigmine (MESTINON) 60 MG tablet TAKE  (1)  TABLET  FOUR TIMES DAILY. 03/09/16   Kathrynn Ducking, MD  rosuvastatin (CRESTOR) 10 MG tablet Take 1 tablet (10 mg total) by mouth daily. 10/07/15   Troy Sine, MD    Family History Family History  Problem Relation Age of Onset  . Colon cancer Mother     Social History Social History  Substance Use Topics  . Smoking status: Former Smoker    Types: Cigarettes    Quit date: 03/22/1959  . Smokeless tobacco: Never Used     Comment: quit 1970s  . Alcohol use No     Allergies   Dicyclomine   Review of Systems Review of Systems  All other systems reviewed and are negative.    Physical Exam Updated Vital Signs BP 154/89 (BP Location: Left Arm)   Pulse 96   Temp 99.3 F (37.4 C) (Oral)   Resp 18   SpO2 96%   Physical Exam  Constitutional: He is oriented to person, place, and time. He appears well-developed and well-nourished.  HENT:  Head: Normocephalic and atraumatic.  Dry mucus membranes  Eyes: Conjunctivae are normal. Pupils are equal, round, and reactive to light. Right eye exhibits no discharge. Left eye exhibits no discharge. No scleral icterus.  Neck: Normal range of motion. No JVD present. No tracheal deviation present.  Pulmonary/Chest: Effort normal. No stridor.  Abdominal: There is tenderness in the suprapubic area.  Neurological: He is alert and oriented to person, place, and time. Coordination normal.  Psychiatric: He has a  normal mood and affect. His behavior is normal. Judgment and thought content normal.  Nursing note and vitals reviewed.  ED Treatments / Results  Labs (all labs ordered are listed, but only abnormal results are displayed) Labs Reviewed  URINALYSIS, ROUTINE W REFLEX MICROSCOPIC (NOT AT [facility]) - Abnormal; Notable for the following:       Result Value   APPearance CLOUDY (*)    Hgb urine dipstick LARGE (*)    Leukocytes, UA SMALL (*)    All other components within normal limits  URINE MICROSCOPIC-ADD ON - Abnormal; Notable for the following:    Squamous Epithelial / LPF 6-30 (*)    Bacteria, UA FEW (*)  All other components within normal limits  URINE CULTURE  CBC WITH DIFFERENTIAL/PLATELET  COMPREHENSIVE METABOLIC PANEL  LIPASE, BLOOD    EKG  EKG Interpretation None       Radiology No results found.  Procedures Procedures (including critical care time)  Medications Ordered in ED Medications  sodium chloride 0.9 % bolus 1,000 mL (1,000 mLs Intravenous New Bag/Given 05/04/16 1629)     Initial Impression / Assessment and Plan / ED Course  I have reviewed the triage vital signs and the nursing notes.  Pertinent labs & imaging results that were available during my care of the patient were reviewed by me and considered in my medical decision making (see chart for details).  Clinical Course     Final Clinical Impressions(s) / ED Diagnoses   Final diagnoses:  Urinary retention    Labs:Urinalysis, CBC, CMP  Imaging: CT abdomen and pelvis without  Consults:  Therapeutics: Saline  Discharge Meds:   Assessment/Plan:  82 year old male presents today with constipation and urinary retention. Patient has had small amount of urine output, small amount of stool output. Patient has 400 mL in his bladder, was unable to void more than 30 mL here in the ED. Old catheter will be placed. Patient also complaining of suprapubic pain, likely this is due to the urinary  retention. There is low suspicion for obstruction, but with an 82 year old male with constipation, abdominal pain CT scan is reasonable. Patient afebrile, nontoxic, he does have dry mucous membranes, normal saline started. Patient's care was transferred to oncoming provider pending laboratory analysis, further evaluation and disposition.    New Prescriptions New Prescriptions   No medications on file     Okey Regal, PA-C 05/04/16 [place], MD 05/04/16 [phone]

## 2016-05-04 NOTE — ED Notes (Signed)
Bladder scan completed, 436 mls found

## 2016-05-04 NOTE — ED Triage Notes (Signed)
Per EMS - patient from home where he lives with his wife with c/o constipation x1 day and difficulty passing urine x1 day.  Patient has of hx of same.  Patient is A&O per baseline.  Patient's vitals 156/98, HR 98, RR 16.

## 2016-05-04 NOTE — Progress Notes (Signed)
Entered in d/c instructions Lenor Coffin, MD  Neurology [phone] [facility] [place] [address]   Next Steps: Go on 06/18/2016  Instructions: YOur scheduled appointment for a 6 month follow up with Dr Jannifer Franklin is on 06/18/16 at 1000

## 2016-05-04 NOTE — H&P (Addendum)
History and Physical    [name] [id number] DOB: 1924-11-25 DOA: 05/04/2016  PCP: [name], MD   Patient coming from: Home   Chief Complaint: Abdominal discomfort, constipation, urinary retention   HPI: [name] is a 82 y.o. male with medical history significant for coronary artery disease with stents, myasthenia gravis on pyridostigmine and CellCept, and history of renal cell carcinoma status post left nephrectomy in 1992 and now presents to the emergency department with 1 day of urinary retention and constipation. Patient reports a general decline in his functional ability over the past couple weeks marked by general fatigue and loss of independence at home. He had recently been driving himself and doing errands around town, but is no longer able to due to his fatigue and generalized malaise. He reports recurrent problems with constipation and now has urinary retention for the past day as well. Patient reports being able to "dribble" a little bit of urine earlier today and also pass a small hard BM earlier on the day of his admission. He denies abdominal pain at the moment, but notes that there has been an intermittent, generalized pain. He endorses current abdominal discomfort, also generalized, and associated with hickups and bloating. He denies any fevers, chills, chest pain, palpitations, dyspnea, or cough. He denies dysuria or flank pain. He denies melena or hematochezia.  ED Course: Upon arrival to the ED, patient is found to be febrile to 38.3 C, saturating well on room air, tachycardic in the low 100s, and with vitals otherwise stable. Chest x-ray features low volumes with bibasilar atelectasis but no infiltrate or edema. CMP features a mild elevation in alkaline phosphatase, albumin of 2.5, and AST of 54. Lipase is mildly elevated to 100. Throat is gentleman CBC is notable for a hemoglobin of 11.8, previously normal. FOBT is positive. Urinalysis appears contaminated but  is not consistent with infection. A CT of the abdomen and pelvis was obtained and demonstrates marked interval increase and a soft tissue mass in the left upper quadrant with possible central necrosis, and likely representing a lymphoma or recurrent RCC. Urine was sent for culture and the patient was given a 1 L normal saline bolus in the ED. Lactic acid was ordered and remains pending. Patient has remained hemodynamically stable in the ED and will be observed on the telemetry unit for ongoing evaluation and management of adult failure to thrive with enlarging abdominal mass and new anemia with FOBT positive stool.  Review of Systems:  All other systems reviewed and apart from HPI, are negative.  Past Medical History:  Diagnosis Date  . Arthritis    back, hips, knees   . Benign prostatic hypertrophy   . Cancer [facility])    h/o renal carcinoma   . Chronic kidney disease    h/o UTI-2012  . Coronary artery disease    sees Dr. Shelva Majestic   . Diverticulitis   . GERD (gastroesophageal reflux disease)   . H/O epistaxis   . Heart disease   . Hypertension   . Myasthenia gravis [facility])    sees Dr. Jannifer Franklin  . Obesity    exogenous  . Osteoporosis   . Partial small bowel obstruction ([place]) 04/16/10   resolved with bowel rest  . Pulmonary embolus ([place])    2000  . Shortness of breath   . Urinary incontinence   . UTI (urinary tract infection)     Past Surgical History:  Procedure Laterality Date  . ANGIOPLASTY  had 2 stent replacements  . CARDIAC CATHETERIZATION     2008, stents   . CARDIAC CATHETERIZATION  2008  . COLONOSCOPY  05/28/10   per Dr. Laurence Spates, diverticulosis, no repeats planned   . DOPPLER ECHOCARDIOGRAPHY    . EYE SURGERY     cataracts(bilateral)  removed, ?iol  . JOINT REPLACEMENT     2005-R, L knee replacement- 2000  . NEPHRECTOMY     left  . NM MYOVIEW LTD  02/2011   moderate inferior scar no ischemia  . SP KYPHOPLASTY     and vertebroplasty  . stented placed   1/08  . stress dipyridamole myocardial perfusion    . TONSILLECTOMY    . TOTAL KNEE ARTHROPLASTY     both Sees Dr. Novella Olive  . TOTAL KNEE REVISION  03/28/2012   Procedure: TOTAL KNEE REVISION;  Surgeon: Hessie Dibble, MD;  Location: [place];  Service: Orthopedics;  Laterality: Right;     reports that he quit smoking about 57 years ago. His smoking use included Cigarettes. He has never used smokeless tobacco. He reports that he does not drink alcohol or use drugs.  Allergies  Allergen Reactions  . Dicyclomine Other (See Comments)    REACTION: can worsen myasthenia gravis    Family History  Problem Relation Age of Onset  . Colon cancer Mother      Prior to Admission medications   Medication Sig Start Date End Date Taking? Authorizing Provider  amoxicillin-clavulanate (AUGMENTIN) 250-125 MG tablet Take 1 tablet by mouth 3 (three) times daily. 04/26/16 05/06/16 Yes Historical Provider, MD  clopidogrel (PLAVIX) 75 MG tablet TAKE 1 TABLET EACH DAY. 10/24/15  Yes Troy Sine, MD  metoprolol succinate (TOPROL-XL) 50 MG 24 hr tablet TAKE 1 TABLET ONCE DAILY. 10/24/15  Yes Troy Sine, MD  Multiple Vitamin (MULTIVITAMIN WITH MINERALS) TABS Take 1 tablet by mouth daily.   Yes Historical Provider, MD  mycophenolate (CELLCEPT) 500 MG tablet TAKE 1 TABLET EVERY MORNING AND 2 TABLETS AT BEDTIME. 04/16/16  Yes Kathrynn Ducking, MD  pyridostigmine (MESTINON) 60 MG tablet TAKE  (1)  TABLET  FOUR TIMES DAILY. Patient taking differently: TAKE  (1)  TABLET TWO  TIMES DAILY. 03/09/16  Yes Kathrynn Ducking, MD  rosuvastatin (CRESTOR) 10 MG tablet Take 1 tablet (10 mg total) by mouth daily. 10/07/15  Yes Troy Sine, MD  phenazopyridine (PYRIDIUM) 200 MG tablet Take 1 tablet (200 mg total) by mouth 3 (three) times daily. Patient not taking: Reported on 05/04/2016 01/31/16   Melony Overly, MD    Physical Exam: Vitals:   05/04/16 1358 05/04/16 1630 05/04/16 1912  BP: 137/67 154/89 141/79  Pulse: 92 96 102    Resp: 18 18 18   Temp: 98.9 F (37.2 C) 99.3 F (37.4 C) 100.9 F (38.3 C)  TempSrc: Oral Oral Rectal  SpO2: 95% 96% 95%      Constitutional: NAD, calm, lethargic Eyes: PERTLA, lids and conjunctivae normal ENMT: Mucous membranes are dry. Posterior pharynx clear of any exudate or lesions.   Neck: normal, supple, no masses, no thyromegaly Respiratory: clear to auscultation bilaterally, no wheezing, no crackles. Normal respiratory effort.   Cardiovascular: S1 & S2 heard, regular rate and rhythm. No extremity edema. 2+ pedal pulses. No significant JVD. Abdomen: No distension, mild tenderness in epigastrium, no rebound pain or guarding. Bowel sounds normal.  Musculoskeletal: no clubbing / cyanosis. No joint deformity upper and lower extremities. Normal muscle tone.  Skin: no significant  rashes, lesions, ulcers. Warm, dry, well-perfused. Neurologic: CN 2-12 grossly intact. Sensation intact, DTR normal. Strength 5/5 in all 4 limbs.  Psychiatric: Normal judgment and insight. Alert and oriented x 3. Normal mood and affect.     Labs on Admission: I have personally reviewed following labs and imaging studies  CBC:  Recent Labs Lab 05/04/16 1617  WBC 7.5  NEUTROABS 5.2  HGB 11.8*  HCT 36.0*  MCV 90.0  PLT [id number]   Basic Metabolic Panel:  Recent Labs Lab 05/04/16 1617  NA 135  K 4.1  CL 104  CO2 24  GLUCOSE 94  BUN 17  CREATININE 0.88  CALCIUM 8.4*   GFR: CrCl cannot be calculated (Unknown ideal weight.). Liver Function Tests:  Recent Labs Lab 05/04/16 1617  AST 54*  ALT 28  ALKPHOS 195*  BILITOT 0.9  PROT 6.9  ALBUMIN 2.5*    Recent Labs Lab 05/04/16 1617  LIPASE 100*   No results for input(s): AMMONIA in the last 168 hours. Coagulation Profile: No results for input(s): INR, PROTIME in the last 168 hours. Cardiac Enzymes: No results for input(s): CKTOTAL, CKMB, CKMBINDEX, TROPONINI in the last 168 hours. BNP (last 3 results) No results for input(s):  PROBNP in the last 8760 hours. HbA1C: No results for input(s): HGBA1C in the last 72 hours. CBG: No results for input(s): GLUCAP in the last 168 hours. Lipid Profile: No results for input(s): CHOL, HDL, LDLCALC, TRIG, CHOLHDL, LDLDIRECT in the last 72 hours. Thyroid Function Tests: No results for input(s): TSH, T4TOTAL, FREET4, T3FREE, THYROIDAB in the last 72 hours. Anemia Panel: No results for input(s): VITAMINB12, FOLATE, FERRITIN, TIBC, IRON, RETICCTPCT in the last 72 hours. Urine analysis:    Component Value Date/Time   COLORURINE YELLOW 05/04/2016 1452   APPEARANCEUR CLOUDY (A) 05/04/2016 1452   LABSPEC 1.028 05/04/2016 1452   PHURINE 5.5 05/04/2016 1452   GLUCOSEU NEGATIVE 05/04/2016 1452   HGBUR LARGE (A) 05/04/2016 1452   HGBUR negative 08/13/2009 1549   BILIRUBINUR NEGATIVE 05/04/2016 1452   KETONESUR NEGATIVE 05/04/2016 1452   PROTEINUR NEGATIVE 05/04/2016 1452   UROBILINOGEN 0.2 01/28/2016 1510   NITRITE NEGATIVE 05/04/2016 1452   LEUKOCYTESUR SMALL (A) 05/04/2016 1452   Sepsis Labs: @LABRCNTIP (procalcitonin:4,lacticidven:4) )No results found for this or any previous visit (from the past 240 hour(s)).   Radiological Exams on Admission: Ct Abdomen Pelvis Wo Contrast  Result Date: 05/04/2016 CLINICAL DATA:  Constipation for 1 day, difficulty urinating for 1 day, abdominal pain, history GERD, BPH, GERD, small-bowel obstruction, hypertension, pulmonary embolism, myasthenia gravis, renal cell carcinoma, coronary artery disease, former smoker EXAM: CT ABDOMEN AND PELVIS WITHOUT CONTRAST TECHNIQUE: Multidetector CT imaging of the abdomen and pelvis was performed following the standard protocol without IV contrast. Sagittal and coronal MPR images reconstructed from axial data set. Neither oral nor intravenous contrast were administered for this study. COMPARISON:  CT abdomen 03/03/2016 FINDINGS: Lower chest: Bibasilar atelectasis, increased. Peribronchial thickening particularly  LEFT lower lobe. Mild interstitial prominence at lung bases little changed. Hepatobiliary: Minimal gallbladder wall calcification. Gallstone seen previously not definitely visualized on current study. No definite focal hepatic lesions. Pancreas: Slight enlargement of the pancreatic tail with ductal dilatation seen on previous exam less well visualized due the lack of IV contrast. Pancreatic appearance little changed. Spleen: No definite intra splenic abnormalities Adrenals/Urinary Tract: Normal-appearing RIGHT adrenal gland. LEFT adrenal gland not visualized. Normal RIGHT kidney and RIGHT ureter. Foley catheter within decompressed urinary bladder. Bladder wall appears thickened though this may be an  artifact from collapsed state. Minimal prostatic enlargement. Post LEFT nephrectomy. Large soft tissue mass LEFT upper quadrant 17.6 x 10.7 x 8.6 cm in size, mixed attenuation with areas of low-attenuation centrally and irregular soft tissue attenuation peripherally. Mass abuts anterior margin of spleen, lateral margin of stomach, and pancreatic tail but epicenter of mass appears external to these structures. Surrounding irregular infiltrative changes of LEFT upper quadrant fat with increased number of vessels. Overlying hernia of soft tissue and fat screw surgical defect in the LEFT flank abdominal wall without bowel herniation. This could represent lymphoma or potentially recurrent renal carcinoma. Stomach/Bowel: Diverticulosis of sigmoid and descending colon without definite evidence of diverticulitis. Stomach decompressed limiting assessment of wall thickness. Bowel loops otherwise unremarkable. Appendix not definitely visualized. Vascular/Lymphatic: Mesenteric collaterals/varices particularly RIGHT of midline. Atherosclerotic calcifications aorta, iliac arteries, femoral arteries, coronary arteries, celiac artery, and splenic artery. Aorta normal caliber. No adenopathy. Reproductive: N/A Other: No free air or free  fluid. Musculoskeletal: Diffuse osseous demineralization. Multiple thoracolumbar compression fractures with prior spinal augmentation procedures at 3 levels. IMPRESSION: Post LEFT nephrectomy. Marked interval increase in size of a soft tissue mass in the LEFT upper quadrant with question central necrosis, now measuring 17.6 x 10.7 x 8.6 cm, favor lymphoma or recurrent renal cell carcinoma ; while this mass abuts the pancreatic tail, lateral wall of stomach, and anterior margin of spleen, the lesion appears to have an epicenter external to these organs. LEFT flank hernia through nephrectomy scar. Chronic enlargement of pancreatic tail, by prior CT consisting of ductal dilatation and mild parenchymal prominence, less well visualized on current noncontrast technique exam. Distal colonic diverticulosis. Aortic atherosclerosis with additional atherosclerotic changes as above. Mesenteric collaterals/varices. Electronically Signed   By: Lavonia Dana M.D.   On: 05/04/2016 17:46   Dg Chest 2 View  Result Date: 05/04/2016 CLINICAL DATA:  82 year old male with fever. EXAM: CHEST  2 VIEW COMPARISON:  11/22/2015 FINDINGS: Lower lung volumes leading to bronchovascular crowding and accentuation of the heart size. There is tortuosity and atherosclerosis of the thoracic aorta, unchanged from prior allowing for differences in technique. Bibasilar atelectasis, no confluent airspace disease allowing for low lung volumes. No pleural effusion or pneumothorax. Multiple vertebral compression deformities with kyphoplasty. IMPRESSION: Low lung volumes with bronchovascular crowding and bibasilar atelectasis. No evidence of focal pneumonia on low volume chest. Electronically Signed   By: Jeb Levering M.D.   On: 05/04/2016 19:54    EKG: Not performed, will obtain as appropriate.   Assessment/Plan  1. LUQ abdominal mass  - CT demonstrates marked interval increase in dimensions with possible central necrosis  - Likely lymphoma  versus recurrent RCC  - Will likely need tissue for diagnosis; IR consulted for biopsy - Dr. Alen Blew of oncology has kindly discussed the case with me, accepted consultation request, and asks that IR biopsy be requested - Supportive care with analgesics, antiemetics    2. SIRS  - Febrile to 38.3 C with HR in low 100's in ED; no leukocytosis, lactate pending  - CXR and UA not suggestive of infection - Suspect this is secondary to malignancy given the marked interval growth in LUQ abd mass  - Blood and urine cultures sent, will follow  - Lactate pending  - Watch off abx for now    3. Normocytic anemia with stool FOBT+ - Hgb 11.8 on admission, down from 14.5 earlier this year - Pt denies melena or hematochezia, but stool is FOBT+ - Hold Plavix for now  - Place on  PPI with Protonix 40 mg IV q12h for now and repeat CBC in am    4. Myasthenia gravis  - Stable, no apparent exacerbation  - Continue pyridostigmine at current-dose  - CellCept held while following cultures given possibility of occult infection   5. CAD  - Hx of BMS to mid-LAD in 2002 and DES to RCA in 2008 - No anginal complaints on admission  - Continue Crestor and hold Plavix for now while monitoring H&H for stability given new anemia and FOBT+ stool    6. Hypertension - At goal currently without treatment  - Monitor  7. Urinary retention   - Uncertain etiology; CT with normal appearance to right kidney and ureter  - Bladder scan revealed 436 cc and foley was placed without incident  - Has documented Hx of BPH, though prostate only minimally enlarged on CT, will trial Flomax     DVT prophylaxis: SCD's  Code Status: Full  Family Communication: Discussed with patient Disposition Plan: Observe on telemetry  Consults called: Oncology    Admission status: Observation    Vianne Bulls, MD [facility] Pager [phone]  If 7PM-7AM, please contact night-coverage [email or web address] Password [email or web address]  05/04/2016,  8:06 PM

## 2016-05-05 ENCOUNTER — Other Ambulatory Visit: Payer: Self-pay | Admitting: Radiology

## 2016-05-05 ENCOUNTER — Encounter (HOSPITAL_COMMUNITY): Payer: Self-pay | Admitting: Radiology

## 2016-05-05 ENCOUNTER — Encounter: Payer: Self-pay | Admitting: *Deleted

## 2016-05-05 ENCOUNTER — Observation Stay (HOSPITAL_COMMUNITY): Payer: Medicare Other

## 2016-05-05 DIAGNOSIS — R627 Adult failure to thrive: Secondary | ICD-10-CM | POA: Diagnosis not present

## 2016-05-05 DIAGNOSIS — R1902 Left upper quadrant abdominal swelling, mass and lump: Secondary | ICD-10-CM

## 2016-05-05 DIAGNOSIS — R509 Fever, unspecified: Secondary | ICD-10-CM | POA: Diagnosis not present

## 2016-05-05 DIAGNOSIS — R339 Retention of urine, unspecified: Secondary | ICD-10-CM | POA: Diagnosis not present

## 2016-05-05 DIAGNOSIS — R1012 Left upper quadrant pain: Secondary | ICD-10-CM

## 2016-05-05 DIAGNOSIS — I10 Essential (primary) hypertension: Secondary | ICD-10-CM | POA: Diagnosis not present

## 2016-05-05 DIAGNOSIS — I2510 Atherosclerotic heart disease of native coronary artery without angina pectoris: Secondary | ICD-10-CM | POA: Diagnosis not present

## 2016-05-05 LAB — PROTIME-INR
INR: 1.37
PROTHROMBIN TIME: 17 s — AB (ref 11.4–15.2)

## 2016-05-05 LAB — CBC
HCT: 30.8 % — ABNORMAL LOW (ref 39.0–52.0)
HEMOGLOBIN: 10.4 g/dL — AB (ref 13.0–17.0)
MCH: 30.3 pg (ref 26.0–34.0)
MCHC: 33.8 g/dL (ref 30.0–36.0)
MCV: 89.8 fL (ref 78.0–100.0)
Platelets: 254 10*3/uL (ref 150–400)
RBC: 3.43 MIL/uL — AB (ref 4.22–5.81)
RDW: 15.3 % (ref 11.5–15.5)
WBC: 5.9 10*3/uL (ref 4.0–10.5)

## 2016-05-05 LAB — LACTIC ACID, PLASMA: LACTIC ACID, VENOUS: 1.9 mmol/L (ref 0.5–1.9)

## 2016-05-05 LAB — COMPREHENSIVE METABOLIC PANEL
ALK PHOS: 157 U/L — AB (ref 38–126)
ALT: 23 U/L (ref 17–63)
ANION GAP: 4 — AB (ref 5–15)
AST: 46 U/L — ABNORMAL HIGH (ref 15–41)
Albumin: 2 g/dL — ABNORMAL LOW (ref 3.5–5.0)
BILIRUBIN TOTAL: 0.8 mg/dL (ref 0.3–1.2)
BUN: 17 mg/dL (ref 6–20)
CALCIUM: 7.7 mg/dL — AB (ref 8.9–10.3)
CO2: 23 mmol/L (ref 22–32)
Chloride: 107 mmol/L (ref 101–111)
Creatinine, Ser: 0.85 mg/dL (ref 0.61–1.24)
Glucose, Bld: 107 mg/dL — ABNORMAL HIGH (ref 65–99)
Potassium: 3.9 mmol/L (ref 3.5–5.1)
Sodium: 134 mmol/L — ABNORMAL LOW (ref 135–145)
TOTAL PROTEIN: 5.6 g/dL — AB (ref 6.5–8.1)

## 2016-05-05 LAB — URINE CULTURE: Culture: NO GROWTH

## 2016-05-05 LAB — PROCALCITONIN

## 2016-05-05 LAB — TSH: TSH: 0.811 u[IU]/mL (ref 0.350–4.500)

## 2016-05-05 LAB — APTT: aPTT: 40 seconds — ABNORMAL HIGH (ref 24–36)

## 2016-05-05 LAB — GLUCOSE, CAPILLARY: Glucose-Capillary: 95 mg/dL (ref 65–99)

## 2016-05-05 MED ORDER — IOPAMIDOL (ISOVUE-300) INJECTION 61%
75.0000 mL | Freq: Once | INTRAVENOUS | Status: AC | PRN
Start: 2016-05-05 — End: 2016-05-05
  Administered 2016-05-05: 75 mL via INTRAVENOUS

## 2016-05-05 MED ORDER — ENOXAPARIN SODIUM 40 MG/0.4ML ~~LOC~~ SOLN
40.0000 mg | SUBCUTANEOUS | Status: DC
  Administered 2016-05-05: 40 mg via SUBCUTANEOUS
  Filled 2016-05-05: qty 0.4

## 2016-05-05 NOTE — Progress Notes (Signed)
Patient ID: [name], male   DOB: [birth year], 82 y.o.   MRN: [id number] Patient will tentatively be scheduled for left upper quadrant mass biopsy as outpatient on 8/9. Instruction sheet given to patient's nurse. Tentative plan discussed with patient. He will remain off Plavix until after biopsy. Above discussed with Dr. Sloan Leiter. Dr.Shadad aware of plans.

## 2016-05-05 NOTE — Progress Notes (Signed)
PROGRESS NOTE        PATIENT DETAILS Name: [name] Age: 82 y.o. Sex: male Date of Birth: 1924-11-24 Admit Date: 05/04/2016 Admitting Physician Vianne Bulls, MD PCP:FRY,STEPHEN A, MD  Brief Narrative: Patient is a 82 y.o. male with prior history of renal cell carcinoma status post left nephrectomy in the 1990s, myasthenia gravis who was admitted for evaluation of abdominal discomfort, constipation and urinary retention. CT of the abdomen showed a marked increase in the soft tissue mass in the left lower quadrant, with concern for development of lymphoma or recurrent renal cell carcinoma. Oncology consulted, recommendations are to pursue CT-guided biopsy. Did undergo Foley catheter placement for urinary retention.   Subjective: Denies any abdominal pain to me. Foley catheter in place.  Assessment/Plan: Principal Problem: Abdominal pain: Suspect this was likely secondary to urinary retention/constipation. Seems to have resolved following placement of a Foley catheter. CT scan of the abdomen did show a left upper quadrant soft tissue mass-but on my exam today patient is nontender.  Active Problems: Left upper quadrant abdominal mass: CT scan of the abdomen showed a large left upper quadrant mass-patient has a history of prior renal cell carcinoma. Evaluated by oncology, recommendations are pursue CT-guided biopsy-unfortunately patient has been on Plavix-radiology would like the Plavix to be held for 5 days prior to proceeding with a biopsy. We will discussed with oncology,but  likely will need to be scheduled as an outpatient.  Hypertension: Chronic control, continue metoprolol.  History of urinary retention: Continue Foley catheter, continue Flomax-suspect will require outpatient voiding trial at his urologist's office.  One episode of low-grade fever in the emergency room: No recurrence since then. No evidence of infection on UA, a CT chest. Afebrile since  admission-continue to monitor off antibiotics for now.  Constipation: Place on bowel regimen and follow.  Myasthenia gravis: No evidence of exacerbation-continue Mestinon  History of CAD (last PCI in 2008): Without chest pain, hold Plavix in anticipation of CT-guided biopsy of his left upper quadrant mass, continue metoprolol and statin.   Normocytic anemia: Although FOBT positive no overt evidence of blood loss-suspect further evaluation can be deferred to the outpatient setting.  DVT Prophylaxis: Prophylactic Lovenox  Code Status: Full code   Family Communication: None at bedside-however spoke to the patient's spouse over the phone  Disposition Plan: Remain inpatient-but will plan on Home health on discharge-likely on 8/3  Antimicrobial agents: None  Procedures: None  CONSULTS:  hematology/oncology  Time spent: 25 minutes-Greater than 50% of this time was spent in counseling, explanation of diagnosis, planning of further management, and coordination of care.  MEDICATIONS: Anti-infectives    None      Scheduled Meds: . antiseptic oral rinse  7 mL Mouth Rinse BID  . docusate sodium  100 mg Oral BID  . metoprolol succinate  50 mg Oral Daily  . multivitamin with minerals  1 tablet Oral Daily  . pantoprazole (PROTONIX) IV  40 mg Intravenous Q12H  . pyridostigmine  60 mg Oral BID  . rosuvastatin  10 mg Oral Daily  . simethicone  40 mg Oral QID  . sodium chloride flush  3 mL Intravenous Q12H  . tamsulosin  0.4 mg Oral Daily   Continuous Infusions:  PRN Meds:.acetaminophen **OR** acetaminophen, bisacodyl, HYDROcodone-acetaminophen, ondansetron **OR** ondansetron (ZOFRAN) IV, polyethylene glycol, prochlorperazine   PHYSICAL EXAM: Vital signs: Vitals:  05/04/16 1630 05/04/16 1912 05/04/16 2031 05/05/16 0511  BP: 154/89 141/79 (!) 159/84 117/66  Pulse: 96 102 (!) 103 92  Resp: 18 18 (!) 24 20  Temp: 99.3 F (37.4 C) 100.9 F (38.3 C) 98.8 F (37.1 C) 98.3 F  (36.8 C)  TempSrc: Oral Rectal Oral Oral  SpO2: 96% 95% 96% 95%  Weight:   104.5 kg (230 lb 4.8 oz)   Height:   5\' 10"  (1.778 m)    Filed Weights   05/04/16 2031  Weight: 104.5 kg (230 lb 4.8 oz)   Body mass index is 33.04 kg/m.   Gen Exam: Awake and alert with clear speech. Not in any distress  Neck: Supple, No JVD.   Chest: B/L Clear.   CVS: S1 S2 Regular Abdomen: soft, BS +, non tender, non distended.  Extremities: no edema, lower extremities warm to touch. Neurologic: Non Focal.   Skin: No Rash or lesions   Wounds: N/A.    LABORATORY DATA: CBC:  Recent Labs Lab 05/04/16 1617 05/05/16 0254  WBC 7.5 5.9  NEUTROABS 5.2  --   HGB 11.8* 10.4*  HCT 36.0* 30.8*  MCV 90.0 89.8  PLT 330 0000000    Basic Metabolic Panel:  Recent Labs Lab 05/04/16 1617 05/05/16 0254  NA 135 134*  K 4.1 3.9  CL 104 107  CO2 24 23  GLUCOSE 94 107*  BUN 17 17  CREATININE 0.88 0.85  CALCIUM 8.4* 7.7*    GFR: Estimated Creatinine Clearance: 72.7 mL/min (by C-G formula based on SCr of 0.85 mg/dL).  Liver Function Tests:  Recent Labs Lab 05/04/16 1617 05/05/16 0254  AST 54* 46*  ALT 28 23  ALKPHOS 195* 157*  BILITOT 0.9 0.8  PROT 6.9 5.6*  ALBUMIN 2.5* 2.0*    Recent Labs Lab 05/04/16 1617  LIPASE 100*   No results for input(s): AMMONIA in the last 168 hours.  Coagulation Profile:  Recent Labs Lab 05/04/16 2329  INR 1.37    Cardiac Enzymes: No results for input(s): CKTOTAL, CKMB, CKMBINDEX, TROPONINI in the last 168 hours.  BNP (last 3 results) No results for input(s): PROBNP in the last 8760 hours.  HbA1C: No results for input(s): HGBA1C in the last 72 hours.  CBG:  Recent Labs Lab 05/05/16 0801  GLUCAP 95    Lipid Profile: No results for input(s): CHOL, HDL, LDLCALC, TRIG, CHOLHDL, LDLDIRECT in the last 72 hours.  Thyroid Function Tests:  Recent Labs  05/04/16 2329  TSH 0.811    Anemia Panel: No results for input(s): VITAMINB12,  FOLATE, FERRITIN, TIBC, IRON, RETICCTPCT in the last 72 hours.  Urine analysis:    Component Value Date/Time   COLORURINE YELLOW 05/04/2016 1452   APPEARANCEUR CLOUDY (A) 05/04/2016 1452   LABSPEC 1.028 05/04/2016 1452   PHURINE 5.5 05/04/2016 1452   GLUCOSEU NEGATIVE 05/04/2016 1452   HGBUR LARGE (A) 05/04/2016 1452   HGBUR negative 08/13/2009 1549   BILIRUBINUR NEGATIVE 05/04/2016 1452   KETONESUR NEGATIVE 05/04/2016 1452   PROTEINUR NEGATIVE 05/04/2016 1452   UROBILINOGEN 0.2 01/28/2016 1510   NITRITE NEGATIVE 05/04/2016 1452   LEUKOCYTESUR SMALL (A) 05/04/2016 1452    Sepsis Labs: Lactic Acid, Venous    Component Value Date/Time   LATICACIDVEN 1.9 05/04/2016 2329    MICROBIOLOGY: Recent Results (from the past 240 hour(s))  Culture, blood (routine x 2)     Status: None (Preliminary result)   Collection Time: 05/04/16 11:37 PM  Result Value Ref Range Status  Specimen Description BLOOD LEFT ANTECUBITAL  Final   Special Requests   Final    BOTTLES DRAWN AEROBIC AND ANAEROBIC 5ML Performed at [facility]    Culture PENDING  Incomplete   Report Status PENDING  Incomplete    RADIOLOGY STUDIES/RESULTS: Ct Abdomen Pelvis Wo Contrast  Result Date: 05/04/2016 CLINICAL DATA:  Constipation for 1 day, difficulty urinating for 1 day, abdominal pain, history GERD, BPH, GERD, small-bowel obstruction, hypertension, pulmonary embolism, myasthenia gravis, renal cell carcinoma, coronary artery disease, former smoker EXAM: CT ABDOMEN AND PELVIS WITHOUT CONTRAST TECHNIQUE: Multidetector CT imaging of the abdomen and pelvis was performed following the standard protocol without IV contrast. Sagittal and coronal MPR images reconstructed from axial data set. Neither oral nor intravenous contrast were administered for this study. COMPARISON:  CT abdomen 03/03/2016 FINDINGS: Lower chest: Bibasilar atelectasis, increased. Peribronchial thickening particularly LEFT lower lobe. Mild  interstitial prominence at lung bases little changed. Hepatobiliary: Minimal gallbladder wall calcification. Gallstone seen previously not definitely visualized on current study. No definite focal hepatic lesions. Pancreas: Slight enlargement of the pancreatic tail with ductal dilatation seen on previous exam less well visualized due the lack of IV contrast. Pancreatic appearance little changed. Spleen: No definite intra splenic abnormalities Adrenals/Urinary Tract: Normal-appearing RIGHT adrenal gland. LEFT adrenal gland not visualized. Normal RIGHT kidney and RIGHT ureter. Foley catheter within decompressed urinary bladder. Bladder wall appears thickened though this may be an artifact from collapsed state. Minimal prostatic enlargement. Post LEFT nephrectomy. Large soft tissue mass LEFT upper quadrant 17.6 x 10.7 x 8.6 cm in size, mixed attenuation with areas of low-attenuation centrally and irregular soft tissue attenuation peripherally. Mass abuts anterior margin of spleen, lateral margin of stomach, and pancreatic tail but epicenter of mass appears external to these structures. Surrounding irregular infiltrative changes of LEFT upper quadrant fat with increased number of vessels. Overlying hernia of soft tissue and fat screw surgical defect in the LEFT flank abdominal wall without bowel herniation. This could represent lymphoma or potentially recurrent renal carcinoma. Stomach/Bowel: Diverticulosis of sigmoid and descending colon without definite evidence of diverticulitis. Stomach decompressed limiting assessment of wall thickness. Bowel loops otherwise unremarkable. Appendix not definitely visualized. Vascular/Lymphatic: Mesenteric collaterals/varices particularly RIGHT of midline. Atherosclerotic calcifications aorta, iliac arteries, femoral arteries, coronary arteries, celiac artery, and splenic artery. Aorta normal caliber. No adenopathy. Reproductive: N/A Other: No free air or free fluid. Musculoskeletal:  Diffuse osseous demineralization. Multiple thoracolumbar compression fractures with prior spinal augmentation procedures at 3 levels. IMPRESSION: Post LEFT nephrectomy. Marked interval increase in size of a soft tissue mass in the LEFT upper quadrant with question central necrosis, now measuring 17.6 x 10.7 x 8.6 cm, favor lymphoma or recurrent renal cell carcinoma ; while this mass abuts the pancreatic tail, lateral wall of stomach, and anterior margin of spleen, the lesion appears to have an epicenter external to these organs. LEFT flank hernia through nephrectomy scar. Chronic enlargement of pancreatic tail, by prior CT consisting of ductal dilatation and mild parenchymal prominence, less well visualized on current noncontrast technique exam. Distal colonic diverticulosis. Aortic atherosclerosis with additional atherosclerotic changes as above. Mesenteric collaterals/varices. Electronically Signed   By: Lavonia Dana M.D.   On: 05/04/2016 17:46   Dg Chest 2 View  Result Date: 05/04/2016 CLINICAL DATA:  82 year old male with fever. EXAM: CHEST  2 VIEW COMPARISON:  11/22/2015 FINDINGS: Lower lung volumes leading to bronchovascular crowding and accentuation of the heart size. There is tortuosity and atherosclerosis of the thoracic aorta, unchanged from prior allowing  for differences in technique. Bibasilar atelectasis, no confluent airspace disease allowing for low lung volumes. No pleural effusion or pneumothorax. Multiple vertebral compression deformities with kyphoplasty. IMPRESSION: Low lung volumes with bronchovascular crowding and bibasilar atelectasis. No evidence of focal pneumonia on low volume chest. Electronically Signed   By: Jeb Levering M.D.   On: 05/04/2016 19:54   Ct Chest W Contrast  Result Date: 05/05/2016 CLINICAL DATA:  Renal cancer.  Evaluate for metastatic disease. EXAM: CT CHEST WITH CONTRAST TECHNIQUE: Multidetector CT imaging of the chest was performed during intravenous contrast  administration. CONTRAST:  72mL ISOVUE-300 IOPAMIDOL (ISOVUE-300) INJECTION 61% COMPARISON:  11/09/2006 FINDINGS: Cardiovascular: Heart is enlarged. No pericardial effusion. Coronary artery calcification is noted. Mediastinum/Nodes: Scattered upper normal size mediastinal lymph nodes are not substantially changed in the interval. One of the larger lymph nodes is a 10 mm short axis subcarinal node that is unchanged in the interval. There is no hilar lymphadenopathy. The esophagus has normal imaging features. There is no axillary lymphadenopathy. Lungs/Pleura: There is chronic scarring in the left lung base with volume loss and bronchiectasis, progressed in the interval. Superimposed atelectasis or progressive scar noted on today's exam. Superimposed airspace infection cannot be excluded. Volume loss with bronchiectasis noted posterior right lower lobe and in the lingula. No discrete pulmonary nodules to raise significant concern for pulmonary metastatic disease in this patient with a history of renal cell carcinoma. No substantial pleural effusion. Upper Abdomen: Large left upper quadrant heterogeneous soft tissue mass better seen on recent CT scan of the abdomen and pelvis. Musculoskeletal: Patient is status post multilevel thoracolumbar vertebral augmentation with un treated compression deformity seen at the T11 vertebral body. IMPRESSION: 1. No definite CT findings to suggest metastatic disease to the chest. 2. Bilateral lower lobe, left greater than right, collapse/consolidation with scarring. 3. Coronary artery atherosclerosis. Electronically Signed   By: Misty Stanley M.D.   On: 05/05/2016 10:31     LOS: 0 days   Oren Binet, MD  [facility] Pager:[phone]  If 7PM-7AM, please contact night-coverage [email or web address] Password [facility] 05/05/2016, 10:55 AM

## 2016-05-05 NOTE — Care Management Note (Signed)
Case Management Note  Patient Details  Name: [name] MRN: [id number] Date of Birth: Aug 21, 1925  Subjective/Objective: Spoke to patient in rm about d/c plans.  Patient's sister [name] was in rm-when I asked patient if it's ok to talk while sister in rm-he stated"as long as she doe not talk to you" As I explained observation status to patient who is a+0x3-Margaret stated "the insurance is not going to pay if he is not inpatient" the patient told her not to talk, so the sister left the rm. Patient does not want his sister [name] to discuss his care with anyone-MD/Nsg is aware. Patient voiced understanding of observation notice.  PT recc HH,rw. Patient chose [facility]-TC rep Cory-accepted referral, will see patient in am-aware of [place] orders. [facility] dme rep aware of rw order & d/c in am. Patient from home w/elderly spouse.                 Action/Plan:d/c plan home w/HHC.   Expected Discharge Date:   (unknown)               Expected Discharge Plan:  [facility]  In-House Referral:     Discharge planning Services  CM Consult  Post Acute Care Choice:    Choice offered to:  Patient  DME Arranged:  [facility] rolling DME Agency:     HH Arranged:  RN, PT, Nurse's Aide, Social Work [employer] Agency:  [facility]  Status of Service:  In process, will continue to follow  If discussed at Long Length of Stay Meetings, dates discussed:    Additional Comments:  Dessa Phi, RN 05/05/2016, 3:46 PM

## 2016-05-05 NOTE — Progress Notes (Signed)
Patient ID: [name], male   DOB: Feb 18, 1925, 82 y.o.   MRN: [id number] Request received for image guided biopsy of left upper quadrant soft tissue mass on pt. Imaging studies were reviewed by Dr. Vernard Gambles. Site appears amenable to biopsy ,however patient has been on Plavix and will need to be held for 5 days prior to performing biopsy. Currently it appears the patient's last dose was on 7/31. We will continue to monitor and schedule accordingly as inpatient or outpatient. For additional questions please contact Dr. Vernard Gambles at [phone].

## 2016-05-05 NOTE — Care Management Obs Status (Signed)
[place] NOTIFICATION   Patient Details  Name: [name] MRN: [id number] Date of Birth: [birth year]   Medicare Observation Status Notification Given:  Yes    MahabirJuliann Pulse, RN 05/05/2016, 12:16 PM

## 2016-05-05 NOTE — Evaluation (Signed)
Physical Therapy Evaluation Patient Details Name: [name] MRN: [id number] DOB: 1924-11-26 Today's Date: 05/05/2016   History of Present Illness  [name] is a 82 y.o. male with medical history significant for coronary artery disease with stents, myasthenia gravis on pyridostigmine and CellCept, and history of renal cell carcinoma status post left nephrectomy in 1992 and now presents to the emergency department with 1 day of urinary retention and constipation. general decline in functional status  Clinical Impression  Pt admitted with above diagnosis. Pt currently with functional limitations due to the deficits listed below (see PT Problem List).  Pt will benefit from skilled PT to increase their independence and safety with mobility to allow discharge to the venue listed below.   Recommend pt be OOB/amb with nsg in addition to PT; recommend HHPT, will follow     Follow Up Recommendations Home health PT    Equipment Recommendations  Rolling walker with 5" wheels    Recommendations for Other Services       Precautions / Restrictions Precautions Precautions: Fall Restrictions Weight Bearing Restrictions: No      Mobility  Bed Mobility Overal bed mobility: Needs Assistance Bed Mobility: Supine to Sit     Supine to sit: Modified independent (Device/Increase time);HOB elevated     General bed mobility comments: uses rail  Transfers Overall transfer level: Needs assistance Equipment used: Rolling walker (2 wheeled) Transfers: Sit to/from Stand Sit to Stand: Min guard         General transfer comment: cues for safety and control of descent  Ambulation/Gait Ambulation/Gait assistance: Min guard Ambulation Distance (Feet): 120 Feet (8' without AD, min/guard to min A) Assistive device: Rolling walker (2 wheeled) Gait Pattern/deviations: Step-through pattern;Trunk flexed     General Gait Details: cues for RW safety, pt denies fatigue, but with mildly incr  WOB  Stairs            Wheelchair Mobility    Modified Rankin (Stroke Patients Only)       Balance Overall balance assessment: Needs assistance Sitting-balance support: Feet supported;No upper extremity supported Sitting balance-Leahy Scale: Good       Standing balance-Leahy Scale: Fair               High level balance activites: Turns;Head turns               Pertinent Vitals/Pain Pain Assessment: No/denies pain    Home Living Family/patient expects to be discharged to:: Private residence Living Arrangements: Spouse/significant other Available Help at Discharge: Family Type of Home: House Home Access: Stairs to enter Entrance Stairs-Rails: Right;Left;Can reach both Entrance Stairs-Number of Steps: 8 Home Layout: Two level;Able to live on main level with bedroom/bathroom   Additional Comments: pt reports his wife has 3in1 and walker    Prior Function Level of Independence: Independent         Comments: pt's sister is present and (pt ok with taking hx in front of sister); then they are very argumentative--sister steps out of room; pt much more calm with only PT present     Hand Dominance        Extremity/Trunk Assessment   Upper Extremity Assessment: Defer to OT evaluation;Generalized weakness           Lower Extremity Assessment: Generalized weakness         Communication   Communication: No difficulties  Cognition Arousal/Alertness: Awake/alert (sleepy) Behavior During Therapy: WFL for tasks assessed/performed Overall Cognitive Status: Within Functional Limits for tasks  assessed                      General Comments      Exercises        Assessment/Plan    PT Assessment    PT Diagnosis Difficulty walking;Generalized weakness   PT Problem List    PT Treatment Interventions     PT Goals (Current goals can be found in the Care Plan section) Acute Rehab PT Goals Patient Stated Goal: pt wants to get his tests  done and get home Time For Goal Achievement: 05/19/16 Potential to Achieve Goals: Good    Frequency     Barriers to discharge        Co-evaluation               End of Session Equipment Utilized During Treatment: Gait belt Activity Tolerance: Patient tolerated treatment well Patient left: in chair;with call bell/phone within reach;with chair alarm set;with family/visitor present      Functional Assessment Tool Used: clinical judgement Functional Limitation: Mobility: Walking and moving around Mobility: Walking and Moving Around Current Status [phone]): At least 1 percent but less than 20 percent impaired, limited or restricted Mobility: Walking and Moving Around Goal Status [phone]): At least 1 percent but less than 20 percent impaired, limited or restricted    Time: 1019-1040 PT Time Calculation (min) (ACUTE ONLY): 21 min   Charges:   PT Evaluation $PT Eval Low Complexity: 1 Procedure     PT G Codes:   PT G-Codes **NOT FOR INPATIENT CLASS** Functional Assessment Tool Used: clinical judgement Functional Limitation: Mobility: Walking and moving around Mobility: Walking and Moving Around Current Status [phone] At least 1 percent but less than 20 percent impaired, limited or restricted Mobility: Walking and Moving Around Goal Status [phone]): At least 1 percent but less than 20 percent impaired, limited or restricted    [facility] 05/05/2016, 11:34 AM

## 2016-05-05 NOTE — Consult Note (Signed)
Reason for Referral: Abdominal mass.   HPI: 82 year old gentleman currently lives in [place] with his wife. He has a history of a few comorbid conditions presented to the emergency department on 05/04/2016 with symptoms of failure to thrive, urinary retention and fever of temperature 38.3 . He had a CT scan of the abdomen and pelvis in the emergency department which showed soft tissue mass in the left upper quadrant and possible necrosis. This mass measuring around 17 x 10 cm suggestive of malignancy. He is status post nephrectomy done in the 90s and has been followed by Dr. Lawerance Bach periodically. He had a CT scan of the abdomen on 03/03/2016 which suggested the presence of a left upper quadrant mass that was felt to be most likely malignant and possibly pseudocyst. During his emergency department visit, he had a Foley catheter inserted and good urine output. I was asked to comment about these findings. Clinically, he reports no major complaints. He is a poor historian and gives a history of left upper quadrant pain. He denied any nausea or vomiting. He denied any major changes in his bowel habits. His main concern is his urinary retention.  You do not any headaches, blurry vision, syncope or seizures. He does not report any fevers or chills or sweats. He does not report any cough, wheezing or hemoptysis. Does not report any chest pain, palpitation, orthopnea or leg edema. He does not report any nausea, vomiting or abdominal pain. Has not report any hematuria or dysuria. Remaining review of systems unremarkable.   Past Medical History:  Diagnosis Date  . Arthritis    back, hips, knees   . Benign prostatic hypertrophy   . Cancer [facility])    h/o renal carcinoma   . Chronic kidney disease    h/o UTI-2012  . Coronary artery disease    sees Dr. Shelva Majestic   . Diverticulitis   . GERD (gastroesophageal reflux disease)   . H/O epistaxis   . Heart disease   . Hypertension   . Myasthenia gravis  [facility])    sees Dr. Jannifer Franklin  . Obesity    exogenous  . Osteoporosis   . Partial small bowel obstruction ([place]) 04/16/10   resolved with bowel rest  . Pulmonary embolus ([place])    2000  . Shortness of breath   . Urinary incontinence   . UTI (urinary tract infection)   :  Past Surgical History:  Procedure Laterality Date  . ANGIOPLASTY     had 2 stent replacements  . CARDIAC CATHETERIZATION     2008, stents   . CARDIAC CATHETERIZATION  2008  . COLONOSCOPY  05/28/10   per Dr. Laurence Spates, diverticulosis, no repeats planned   . DOPPLER ECHOCARDIOGRAPHY    . EYE SURGERY     cataracts(bilateral)  removed, ?iol  . JOINT REPLACEMENT     2005-R, L knee replacement- 2000  . NEPHRECTOMY     left  . NM MYOVIEW LTD  02/2011   moderate inferior scar no ischemia  . SP KYPHOPLASTY     and vertebroplasty  . stented placed  1/08  . stress dipyridamole myocardial perfusion    . TONSILLECTOMY    . TOTAL KNEE ARTHROPLASTY     both Sees Dr. Novella Olive  . TOTAL KNEE REVISION  03/28/2012   Procedure: TOTAL KNEE REVISION;  Surgeon: Hessie Dibble, MD;  Location: [place];  Service: Orthopedics;  Laterality: Right;  :   Current Facility-Administered Medications:  .  0.9 %  sodium chloride infusion, , Intravenous, Continuous, Vianne Bulls, MD, Last Rate: 75 mL/hr at 05/04/16 2054 .  acetaminophen (TYLENOL) tablet 650 mg, 650 mg, Oral, Q6H PRN **OR** acetaminophen (TYLENOL) suppository 650 mg, 650 mg, Rectal, Q6H PRN, Ilene Qua Opyd, MD .  antiseptic oral rinse (CPC / CETYLPYRIDINIUM CHLORIDE 0.05%) solution 7 mL, 7 mL, Mouth Rinse, BID, Ilene Qua Opyd, MD, 7 mL at 05/04/16 2158 .  bisacodyl (DULCOLAX) EC tablet 5 mg, 5 mg, Oral, Daily PRN, Ilene Qua Opyd, MD .  clopidogrel (PLAVIX) tablet 75 mg, 75 mg, Oral, Daily, Ilene Qua Opyd, MD .  docusate sodium (COLACE) capsule 100 mg, 100 mg, Oral, BID, Ilene Qua Opyd, MD, 100 mg at 05/04/16 2157 .  HYDROcodone-acetaminophen (NORCO/VICODIN) 5-325 MG per  tablet 1-2 tablet, 1-2 tablet, Oral, Q4H PRN, Vianne Bulls, MD, 1 tablet at 05/05/16 0322 .  metoprolol succinate (TOPROL-XL) 24 hr tablet 50 mg, 50 mg, Oral, Daily, Ilene Qua Opyd, MD .  multivitamin with minerals tablet 1 tablet, 1 tablet, Oral, Daily, Vianne Bulls, MD, 1 tablet at 05/04/16 2157 .  ondansetron (ZOFRAN) tablet 4 mg, 4 mg, Oral, Q6H PRN **OR** ondansetron (ZOFRAN) injection 4 mg, 4 mg, Intravenous, Q6H PRN, Vianne Bulls, MD, 4 mg at 05/04/16 2208 .  pantoprazole (PROTONIX) injection 40 mg, 40 mg, Intravenous, Q12H, Ilene Qua Opyd, MD, 40 mg at 05/04/16 2156 .  polyethylene glycol (MIRALAX / GLYCOLAX) packet 17 g, 17 g, Oral, Daily PRN, Ilene Qua Opyd, MD .  prochlorperazine (COMPAZINE) injection 10 mg, 10 mg, Intravenous, Q6H PRN, Ilene Qua Opyd, MD .  pyridostigmine (MESTINON) tablet 60 mg, 60 mg, Oral, BID, Vianne Bulls, MD, 60 mg at 05/04/16 2157 .  rosuvastatin (CRESTOR) tablet 10 mg, 10 mg, Oral, Daily, Ilene Qua Opyd, MD, 10 mg at 05/04/16 2156 .  simethicone (MYLICON) 40 [id number] suspension 40 mg, 40 mg, Oral, QID, Ilene Qua Opyd, MD, 40 mg at 05/04/16 2157 .  sodium chloride flush (NS) 0.9 % injection 3 mL, 3 mL, Intravenous, Q12H, Ilene Qua Opyd, MD, 3 mL at 05/04/16 2054 .  tamsulosin (FLOMAX) capsule 0.4 mg, 0.4 mg, Oral, Daily, Ilene Qua Opyd, MD, 0.4 mg at 05/04/16 2157:  Allergies  Allergen Reactions  . Dicyclomine Other (See Comments)    REACTION: can worsen myasthenia gravis  :  Family History  Problem Relation Age of Onset  . Colon cancer Mother   :  Social History   Social History  . Marital status: Married    Spouse name: N/A  . Number of children: 3  . Years of education: HS   Occupational History  . Retired [place] Topics  . Smoking status: Former Smoker    Types: Cigarettes    Quit date: 03/22/1959  . Smokeless tobacco: Never Used     Comment: quit 1970s  . Alcohol use No  . Drug use: No  .  Sexual activity: Not Currently   Other Topics Concern  . Not on file   Social History Narrative   Patient is retired   Patient has a high school education       Patient drinks 1-2 cups of caffeine daily.   Patient is right handed.   :  Pertinent items are noted in HPI.  Exam: Blood pressure 117/66, pulse 92, temperature 98.3 F (36.8 C), temperature source Oral, resp. rate 20, height 5\' 10"  (1.778 m), weight 230 lb 4.8 oz (104.5 kg), SpO2  95 %.  ECOG 1 General appearance: alert and cooperative. Hard of hearing and poor historian Head: Normocephalic, without obvious abnormality Throat: No oral thrush noted. Neck: no adenopathy Back: negative Resp: clear to auscultation bilaterally no dullness to percussion. Cardio: regular rate and rhythm, S1, S2 normal, no murmur, click, rub or gallop GI: soft, non-tender; bowel sounds normal; no masses,  no organomegaly. No shifting dullness or palpated masses. Extremities: extremities normal, atraumatic, no cyanosis or edema   Recent Labs  05/04/16 1617 05/05/16 0254  WBC 7.5 5.9  HGB 11.8* 10.4*  HCT 36.0* 30.8*  PLT 330 254    Recent Labs  05/04/16 1617 05/05/16 0254  NA 135 134*  K 4.1 3.9  CL 104 107  CO2 24 23  GLUCOSE 94 107*  BUN 17 17  CREATININE 0.88 0.85  CALCIUM 8.4* 7.7*     Ct Abdomen Pelvis Wo Contrast  Result Date: 05/04/2016 CLINICAL DATA:  Constipation for 1 day, difficulty urinating for 1 day, abdominal pain, history GERD, BPH, GERD, small-bowel obstruction, hypertension, pulmonary embolism, myasthenia gravis, renal cell carcinoma, coronary artery disease, former smoker EXAM: CT ABDOMEN AND PELVIS WITHOUT CONTRAST TECHNIQUE: Multidetector CT imaging of the abdomen and pelvis was performed following the standard protocol without IV contrast. Sagittal and coronal MPR images reconstructed from axial data set. Neither oral nor intravenous contrast were administered for this study. COMPARISON:  CT abdomen  03/03/2016 FINDINGS: Lower chest: Bibasilar atelectasis, increased. Peribronchial thickening particularly LEFT lower lobe. Mild interstitial prominence at lung bases little changed. Hepatobiliary: Minimal gallbladder wall calcification. Gallstone seen previously not definitely visualized on current study. No definite focal hepatic lesions. Pancreas: Slight enlargement of the pancreatic tail with ductal dilatation seen on previous exam less well visualized due the lack of IV contrast. Pancreatic appearance little changed. Spleen: No definite intra splenic abnormalities Adrenals/Urinary Tract: Normal-appearing RIGHT adrenal gland. LEFT adrenal gland not visualized. Normal RIGHT kidney and RIGHT ureter. Foley catheter within decompressed urinary bladder. Bladder wall appears thickened though this may be an artifact from collapsed state. Minimal prostatic enlargement. Post LEFT nephrectomy. Large soft tissue mass LEFT upper quadrant 17.6 x 10.7 x 8.6 cm in size, mixed attenuation with areas of low-attenuation centrally and irregular soft tissue attenuation peripherally. Mass abuts anterior margin of spleen, lateral margin of stomach, and pancreatic tail but epicenter of mass appears external to these structures. Surrounding irregular infiltrative changes of LEFT upper quadrant fat with increased number of vessels. Overlying hernia of soft tissue and fat screw surgical defect in the LEFT flank abdominal wall without bowel herniation. This could represent lymphoma or potentially recurrent renal carcinoma. Stomach/Bowel: Diverticulosis of sigmoid and descending colon without definite evidence of diverticulitis. Stomach decompressed limiting assessment of wall thickness. Bowel loops otherwise unremarkable. Appendix not definitely visualized. Vascular/Lymphatic: Mesenteric collaterals/varices particularly RIGHT of midline. Atherosclerotic calcifications aorta, iliac arteries, femoral arteries, coronary arteries, celiac  artery, and splenic artery. Aorta normal caliber. No adenopathy. Reproductive: N/A Other: No free air or free fluid. Musculoskeletal: Diffuse osseous demineralization. Multiple thoracolumbar compression fractures with prior spinal augmentation procedures at 3 levels. IMPRESSION: Post LEFT nephrectomy. Marked interval increase in size of a soft tissue mass in the LEFT upper quadrant with question central necrosis, now measuring 17.6 x 10.7 x 8.6 cm, favor lymphoma or recurrent renal cell carcinoma ; while this mass abuts the pancreatic tail, lateral wall of stomach, and anterior margin of spleen, the lesion appears to have an epicenter external to these organs. LEFT flank hernia through nephrectomy scar.  Chronic enlargement of pancreatic tail, by prior CT consisting of ductal dilatation and mild parenchymal prominence, less well visualized on current noncontrast technique exam. Distal colonic diverticulosis. Aortic atherosclerosis with additional atherosclerotic changes as above. Mesenteric collaterals/varices. Electronically Signed   By: Lavonia Dana M.D.   On: 05/04/2016 17:46   Dg Chest 2 View  Result Date: 05/04/2016 CLINICAL DATA:  82 year old male with fever. EXAM: CHEST  2 VIEW COMPARISON:  11/22/2015 FINDINGS: Lower lung volumes leading to bronchovascular crowding and accentuation of the heart size. There is tortuosity and atherosclerosis of the thoracic aorta, unchanged from prior allowing for differences in technique. Bibasilar atelectasis, no confluent airspace disease allowing for low lung volumes. No pleural effusion or pneumothorax. Multiple vertebral compression deformities with kyphoplasty. IMPRESSION: Low lung volumes with bronchovascular crowding and bibasilar atelectasis. No evidence of focal pneumonia on low volume chest. Electronically Signed   By: Jeb Levering M.D.   On: 05/04/2016 19:54    Assessment and Plan:   82 year old gentleman with the following issues:  1. Left upper  quadrant soft tissue mass with central necrosis. The masses measuring 17.6 x 10.7 x 8.6 cm. The differential diagnosis includes metastatic malignancy which could be renal cell carcinoma, pancreatic cancer among other malignancies. Lymphoma is also a possibility. He did have a CT scan on 03/03/2016 which suggested the presence of a complex pseudocyst. This also would be a possibility but less likely.  From a management standpoint I recommend biopsy of this lesion for full determination at this time. Therapeutic intervention will be depending on the etiology of this tumor. If this is a benign cyst, clearly no oncological intervention will be needed at this time.  I appreciate the help from interventional radiology and if it's all possible obtaining multiple cores in case we are dealing with lymphoma.  I will obtain CT scan of the chest for completeness to detect any other site of metastasis.  2. Urinary retention: Unclear etiology appears to have been managed with Foley catheter insertion.  3. Fever and failure to thrive: Possibly related to malignancy versus an occult infection. Treatment is undergoing per the primary team.  I will follow-up once we have results on the pathology from his biopsy.

## 2016-05-06 DIAGNOSIS — I129 Hypertensive chronic kidney disease with stage 1 through stage 4 chronic kidney disease, or unspecified chronic kidney disease: Secondary | ICD-10-CM | POA: Diagnosis not present

## 2016-05-06 DIAGNOSIS — Z85528 Personal history of other malignant neoplasm of kidney: Secondary | ICD-10-CM | POA: Diagnosis not present

## 2016-05-06 DIAGNOSIS — I10 Essential (primary) hypertension: Secondary | ICD-10-CM | POA: Diagnosis not present

## 2016-05-06 DIAGNOSIS — N189 Chronic kidney disease, unspecified: Secondary | ICD-10-CM | POA: Diagnosis not present

## 2016-05-06 DIAGNOSIS — R1012 Left upper quadrant pain: Secondary | ICD-10-CM | POA: Diagnosis not present

## 2016-05-06 DIAGNOSIS — R339 Retention of urine, unspecified: Secondary | ICD-10-CM | POA: Diagnosis not present

## 2016-05-06 DIAGNOSIS — I2510 Atherosclerotic heart disease of native coronary artery without angina pectoris: Secondary | ICD-10-CM | POA: Diagnosis not present

## 2016-05-06 DIAGNOSIS — R1902 Left upper quadrant abdominal swelling, mass and lump: Secondary | ICD-10-CM | POA: Diagnosis not present

## 2016-05-06 LAB — GLUCOSE, CAPILLARY: Glucose-Capillary: 99 mg/dL (ref 65–99)

## 2016-05-06 MED ORDER — POLYETHYLENE GLYCOL 3350 17 G PO PACK: 17.0000 g | PACK | Freq: Every day | ORAL | 0 refills | Status: DC

## 2016-05-06 MED ORDER — TAMSULOSIN HCL 0.4 MG PO CAPS: 0.4000 mg | ORAL_CAPSULE | Freq: Every day | ORAL | 0 refills | Status: AC

## 2016-05-06 MED ORDER — SENNA 8.6 MG PO TABS: 2.0000 | ORAL_TABLET | Freq: Every evening | ORAL | Status: DC | PRN

## 2016-05-06 NOTE — Discharge Summary (Signed)
PATIENT DETAILS Name: [name] Age: 82 y.o. Sex: male Date of Birth: 06/06/1925 MRN: [id number]. Admitting Physician: Vianne Bulls, MD PCP:FRY,STEPHEN A, MD  Admit Date: 05/04/2016 Discharge date: 05/06/2016  Recommendations for Outpatient Follow-up:  1. Follow up with PCP in 1 week 2. Ensure follow-up with the patient's primary urologist in 1 week for outpatient voiding trial 3. Patient already has an appointment with interventional radiology on 8/94 biopsy of Upper quadrant mass-he is to continue to not take Plavix till then. He will need to have except Plavix resumed when it is okay with interventional radiology 4. Please obtain BMP/CBC in one week 5. FOBT positive-has developed slight anemia-defer further workup to the outpatient setting (no overt bleeding) 6. Please follow up on the following pending results: Blood cultures (negative so far)  Admitted From:  Home  Disposition: Home with home health services   Albers: YES (home health PT/RN/Aide)  Equipment/Devices: Foley catheter continued on discharge-with plans to remove when he follows up with his urologist's office for a voiding trial.  Discharge Condition: Stable  CODE STATUS: FULL CODE  Diet recommendation:  Heart Healthy  Brief Summary: See H&P, Labs, Consult and Test reports for all details in brief, Patient is a 82 y.o. male with prior history of renal cell carcinoma status post left nephrectomy in the 1990s, myasthenia gravis who was admitted for evaluation of abdominal discomfort, constipation and urinary retention. CT of the abdomen showed a marked increase in the soft tissue mass in the left lower quadrant, with concern for development of lymphoma or recurrent renal cell carcinoma. Oncology consulted, recommendations are to pursue CT-guided biopsy-however as patient was on Plavix-unable to pursue CT-guided biopsy to at least 5 more days. He is now scheduled for a outpatient CT-guided biopsy on  8/9. Did undergo Foley catheter placement for urinary retention-with plans to follow-up with his primary urologist Dr. Tresa Endo in the next week or so for an outpatient voiding trial.   Brief Hospital Course: Abdominal pain: Suspect this was likely secondary to urinary retention/constipation. Seems to have resolved following placement of a Foley catheter. Has had a bowel movement following initiation of a bowel regimen with MiraLAX and Senokot. CT scan of the abdomen did show a left upper quadrant soft tissue mass-but on my exam today patient is nontender. Tolerating diet  Active Problems: Left upper quadrant abdominal mass: CT scan of the abdomen showed a large left upper quadrant mass-patient has a history of prior renal cell carcinoma. Evaluated by oncology, recommendations are pursue CT-guided biopsy-unfortunately patient has been on Plavix-radiology would like the Plavix to be held for 5 days prior to proceeding with a biopsy. Case was discussed with both interventional radiology and oncology, he has not been scheduled for a outpatient CT-guided liver biopsy on 8/9. He has been instructed to continue to hold Plavix until then, and only to resume after speaking with his radiologist.   Hypertension:  controlled, continue metoprolol.  History of urinary retention: Continue Foley catheter, continue Flomax-suspect will require outpatient voiding trial at his urologist's office. Continue Flomax on discharge  One episode of low-grade fever in the emergency room: No recurrence since then. No evidence of infection on UA, a CT chest. Afebrile since admission-blood cultures continue to be negative-continue to monitor off antibiotics for now.  Constipation: Had bowel movement yesterday-continue MiraLAX on discharge, use Senokot as needed.   Myasthenia gravis: No evidence of exacerbation-continue Mestinon  History of CAD (last PCI in 2008): Without chest pain,  hold Plavix in anticipation of  CT-guided biopsy of his left upper quadrant mass, continue metoprolol and statin.   Normocytic anemia: Although FOBT positive no overt evidence of blood loss-suspect further evaluation can be deferred to the outpatient setting.  Discharge Diagnoses:  Principal Problem:   Abdominal pain Active Problems:   Personal history of renal cell carcinoma   MYASTHENIA GRAVIS W/O (ACUTE) EXACERBATION   Essential hypertension   GI bleed   Abdominal mass, LUQ (left upper quadrant)   Normocytic anemia   CAD (coronary artery disease)   SIRS (systemic inflammatory response syndrome) (HCC)   Failure to thrive in adult   Urinary retention   Discharge Instructions: Discharge Instructions    Call MD for:  persistant nausea and vomiting    Complete by:  As directed   Call MD for:  severe uncontrolled pain    Complete by:  As directed   Diet - low sodium heart healthy    Complete by:  As directed   Discharge instructions    Complete by:  As directed   Continue to hold Plavix to you have your biopsy done on 8/9, resume Plavix post biopsy only after talking with the radiologist.  You are being discharged with a Foley catheter-please call your urologist and scheduled a appointment for a outpatient voiding trial  Follow with Primary MD  [name], MD  and your Urologist MD in 1 week  Please get a complete blood count and chemistry panel checked by your Primary MD at your next visit, and again as instructed by your Primary MD. Get Medicines reviewed and adjusted. Please take all your medications with you for your next visit with your Primary MD  Please request your Primary MD to go over all hospital tests and procedure/radiological results at the follow up, please ask your Primary MD to get all Hospital records sent to his/her office.  If you experience worsening of your admission symptoms, develop shortness of breath, life threatening emergency, suicidal or homicidal thoughts you must seek medical  attention immediately by calling 911 or calling your MD immediately  if symptoms less severe.  You must read complete instructions/literature along with all the possible adverse reactions/side effects for all the Medicines you take and that have been prescribed to you. Take any new Medicines after you have completely understood and accpet all the possible adverse reactions/side effects.   Do not drive when taking Pain medications or sleeping medications (Benzodaizepines)  Do not take more than prescribed Pain, Sleep and Anxiety Medications  Special Instructions: If you have smoked or chewed Tobacco  in the last 2 yrs please stop smoking, stop any regular Alcohol  and or any Recreational drug use.  Wear Seat belts while driving.  Please note  You were cared for by a hospitalist during your hospital stay. Once you are discharged, your primary care physician will handle any further medical issues. Please note that NO REFILLS for any discharge medications will be authorized once you are discharged, as it is imperative that you return to your primary care physician (or establish a relationship with a primary care physician if you do not have one) for your aftercare needs so that they can reassess your need for medications and monitor your lab values.   Increase activity slowly    Complete by:  As directed       Medication List    STOP taking these medications   amoxicillin-clavulanate 250-125 MG tablet Commonly known as:  AUGMENTIN  clopidogrel 75 MG tablet Commonly known as:  PLAVIX     TAKE these medications   metoprolol succinate 50 MG 24 hr tablet Commonly known as:  TOPROL-XL TAKE 1 TABLET ONCE DAILY.   multivitamin with minerals Tabs tablet Take 1 tablet by mouth daily.   mycophenolate 500 MG tablet Commonly known as:  CELLCEPT TAKE 1 TABLET EVERY MORNING AND 2 TABLETS AT BEDTIME.   phenazopyridine 200 MG tablet Commonly known as:  PYRIDIUM Take 1 tablet (200 mg total) by  mouth 3 (three) times daily.   polyethylene glycol packet Commonly known as:  MIRALAX / GLYCOLAX Take 17 g by mouth daily.   pyridostigmine 60 MG tablet Commonly known as:  MESTINON TAKE  (1)  TABLET  FOUR TIMES DAILY. What changed:  See the new instructions.   rosuvastatin 10 MG tablet Commonly known as:  CRESTOR Take 1 tablet (10 mg total) by mouth daily.   tamsulosin 0.4 MG Caps capsule Commonly known as:  FLOMAX Take 1 capsule (0.4 mg total) by mouth daily.      Follow-up Information    Lenor Coffin, MD. Go on 06/18/2016.   Specialty:  Neurology Why:  YOur scheduled appointment for a 6 month follow up with Dr Jannifer Franklin is on 06/18/16 at 1000 Contact information: [address] [place] [address] [place] .   Specialty:  [place] Why:  [facility], physical therapy,aide, social worker Contact information: [place] [address] [phone]        Inc[facility] - Dme [facility] .   Why:  rolling walker Contact information: [address] [place] [address] [phone]        [name], MD. Schedule an appointment as soon as possible for a visit in 1 week(s).   Specialty:  Family Medicine Contact information: [place] [address] [phone]        DAVIS III, Duane Lope, MD .   Specialty:  Urology Why:  Please call this office and make an appointment in 1 week for a outpatient a voiding trial (Foley catheter removal) Contact information: [place] [address] [phone]        Interventional Radiology .   Why:  Please follow instructions-you have an appointment scheduled on 8/9 for a biopsy of your left upper quadrant abdominal mass.         Allergies  Allergen Reactions  . Dicyclomine Other (See Comments)    REACTION: can worsen myasthenia gravis    Activity:  As tolerated with Full fall  precautions use walker/cane & assistance as needed   Consultations:   hematology/oncology  IR   Procedures/Studies: Ct Abdomen Pelvis Wo Contrast  Result Date: 05/04/2016 CLINICAL DATA:  Constipation for 1 day, difficulty urinating for 1 day, abdominal pain, history GERD, BPH, GERD, small-bowel obstruction, hypertension, pulmonary embolism, myasthenia gravis, renal cell carcinoma, coronary artery disease, former smoker EXAM: CT ABDOMEN AND PELVIS WITHOUT CONTRAST TECHNIQUE: Multidetector CT imaging of the abdomen and pelvis was performed following the standard protocol without IV contrast. Sagittal and coronal MPR images reconstructed from axial data set. Neither oral nor intravenous contrast were administered for this study. COMPARISON:  CT abdomen 03/03/2016 FINDINGS: Lower chest: Bibasilar atelectasis, increased. Peribronchial thickening particularly LEFT lower lobe. Mild interstitial prominence at lung bases little changed. Hepatobiliary: Minimal gallbladder wall calcification. Gallstone seen previously not definitely visualized on current  study. No definite focal hepatic lesions. Pancreas: Slight enlargement of the pancreatic tail with ductal dilatation seen on previous exam less well visualized due the lack of IV contrast. Pancreatic appearance little changed. Spleen: No definite intra splenic abnormalities Adrenals/Urinary Tract: Normal-appearing RIGHT adrenal gland. LEFT adrenal gland not visualized. Normal RIGHT kidney and RIGHT ureter. Foley catheter within decompressed urinary bladder. Bladder wall appears thickened though this may be an artifact from collapsed state. Minimal prostatic enlargement. Post LEFT nephrectomy. Large soft tissue mass LEFT upper quadrant 17.6 x 10.7 x 8.6 cm in size, mixed attenuation with areas of low-attenuation centrally and irregular soft tissue attenuation peripherally. Mass abuts anterior margin of spleen, lateral margin of stomach, and pancreatic tail but  epicenter of mass appears external to these structures. Surrounding irregular infiltrative changes of LEFT upper quadrant fat with increased number of vessels. Overlying hernia of soft tissue and fat screw surgical defect in the LEFT flank abdominal wall without bowel herniation. This could represent lymphoma or potentially recurrent renal carcinoma. Stomach/Bowel: Diverticulosis of sigmoid and descending colon without definite evidence of diverticulitis. Stomach decompressed limiting assessment of wall thickness. Bowel loops otherwise unremarkable. Appendix not definitely visualized. Vascular/Lymphatic: Mesenteric collaterals/varices particularly RIGHT of midline. Atherosclerotic calcifications aorta, iliac arteries, femoral arteries, coronary arteries, celiac artery, and splenic artery. Aorta normal caliber. No adenopathy. Reproductive: N/A Other: No free air or free fluid. Musculoskeletal: Diffuse osseous demineralization. Multiple thoracolumbar compression fractures with prior spinal augmentation procedures at 3 levels. IMPRESSION: Post LEFT nephrectomy. Marked interval increase in size of a soft tissue mass in the LEFT upper quadrant with question central necrosis, now measuring 17.6 x 10.7 x 8.6 cm, favor lymphoma or recurrent renal cell carcinoma ; while this mass abuts the pancreatic tail, lateral wall of stomach, and anterior margin of spleen, the lesion appears to have an epicenter external to these organs. LEFT flank hernia through nephrectomy scar. Chronic enlargement of pancreatic tail, by prior CT consisting of ductal dilatation and mild parenchymal prominence, less well visualized on current noncontrast technique exam. Distal colonic diverticulosis. Aortic atherosclerosis with additional atherosclerotic changes as above. Mesenteric collaterals/varices. Electronically Signed   By: Lavonia Dana M.D.   On: 05/04/2016 17:46   Dg Chest 2 View  Result Date: 05/04/2016 CLINICAL DATA:  83 year old male with  fever. EXAM: CHEST  2 VIEW COMPARISON:  11/22/2015 FINDINGS: Lower lung volumes leading to bronchovascular crowding and accentuation of the heart size. There is tortuosity and atherosclerosis of the thoracic aorta, unchanged from prior allowing for differences in technique. Bibasilar atelectasis, no confluent airspace disease allowing for low lung volumes. No pleural effusion or pneumothorax. Multiple vertebral compression deformities with kyphoplasty. IMPRESSION: Low lung volumes with bronchovascular crowding and bibasilar atelectasis. No evidence of focal pneumonia on low volume chest. Electronically Signed   By: Jeb Levering M.D.   On: 05/04/2016 19:54   Ct Chest W Contrast  Result Date: 05/05/2016 CLINICAL DATA:  Renal cancer.  Evaluate for metastatic disease. EXAM: CT CHEST WITH CONTRAST TECHNIQUE: Multidetector CT imaging of the chest was performed during intravenous contrast administration. CONTRAST:  26mL ISOVUE-300 IOPAMIDOL (ISOVUE-300) INJECTION 61% COMPARISON:  11/09/2006 FINDINGS: Cardiovascular: Heart is enlarged. No pericardial effusion. Coronary artery calcification is noted. Mediastinum/Nodes: Scattered upper normal size mediastinal lymph nodes are not substantially changed in the interval. One of the larger lymph nodes is a 10 mm short axis subcarinal node that is unchanged in the interval. There is no hilar lymphadenopathy. The esophagus has normal imaging features. There is no axillary lymphadenopathy.  Lungs/Pleura: There is chronic scarring in the left lung base with volume loss and bronchiectasis, progressed in the interval. Superimposed atelectasis or progressive scar noted on today's exam. Superimposed airspace infection cannot be excluded. Volume loss with bronchiectasis noted posterior right lower lobe and in the lingula. No discrete pulmonary nodules to raise significant concern for pulmonary metastatic disease in this patient with a history of renal cell carcinoma. No substantial  pleural effusion. Upper Abdomen: Large left upper quadrant heterogeneous soft tissue mass better seen on recent CT scan of the abdomen and pelvis. Musculoskeletal: Patient is status post multilevel thoracolumbar vertebral augmentation with un treated compression deformity seen at the T11 vertebral body. IMPRESSION: 1. No definite CT findings to suggest metastatic disease to the chest. 2. Bilateral lower lobe, left greater than right, collapse/consolidation with scarring. 3. Coronary artery atherosclerosis. Electronically Signed   By: Misty Stanley M.D.   On: 05/05/2016 10:31    TODAY-DAY OF DISCHARGE:  Subjective:   [name] today has no headache,no chest abdominal pain,no new weakness tingling or numbness, feels much better wants to go home today.   Objective:   Blood pressure 127/63, pulse 80, temperature 98.4 F (36.9 C), temperature source Oral, resp. rate 18, height 5\' 10"  (1.778 m), weight 106.2 kg (234 lb 3.2 oz), SpO2 94 %.  Intake/Output Summary (Last 24 hours) at 05/06/16 0823 Last data filed at 05/06/16 0540  Gross per 24 hour  Intake                0 ml  Output              650 ml  Net             -650 ml   Filed Weights   05/04/16 2031 05/06/16 0332  Weight: 104.5 kg (230 lb 4.8 oz) 106.2 kg (234 lb 3.2 oz)    Exam: Awake Alert, Oriented *3, No new F.N deficits, Normal affect [place].AT,PERRAL Supple Neck,No JVD, No cervical lymphadenopathy appriciated.  Symmetrical Chest wall movement, Good air movement bilaterally, CTAB RRR,No Gallops,Rubs or new Murmurs, No Parasternal Heave +ve B.Sounds, Abd Soft, Non tender, No organomegaly appriciated, No rebound -guarding or rigidity. No Cyanosis, Clubbing or edema, No new Rash or bruise   PERTINENT RADIOLOGIC STUDIES: Ct Abdomen Pelvis Wo Contrast  Result Date: 05/04/2016 CLINICAL DATA:  Constipation for 1 day, difficulty urinating for 1 day, abdominal pain, history GERD, BPH, GERD, small-bowel obstruction, hypertension,  pulmonary embolism, myasthenia gravis, renal cell carcinoma, coronary artery disease, former smoker EXAM: CT ABDOMEN AND PELVIS WITHOUT CONTRAST TECHNIQUE: Multidetector CT imaging of the abdomen and pelvis was performed following the standard protocol without IV contrast. Sagittal and coronal MPR images reconstructed from axial data set. Neither oral nor intravenous contrast were administered for this study. COMPARISON:  CT abdomen 03/03/2016 FINDINGS: Lower chest: Bibasilar atelectasis, increased. Peribronchial thickening particularly LEFT lower lobe. Mild interstitial prominence at lung bases little changed. Hepatobiliary: Minimal gallbladder wall calcification. Gallstone seen previously not definitely visualized on current study. No definite focal hepatic lesions. Pancreas: Slight enlargement of the pancreatic tail with ductal dilatation seen on previous exam less well visualized due the lack of IV contrast. Pancreatic appearance little changed. Spleen: No definite intra splenic abnormalities Adrenals/Urinary Tract: Normal-appearing RIGHT adrenal gland. LEFT adrenal gland not visualized. Normal RIGHT kidney and RIGHT ureter. Foley catheter within decompressed urinary bladder. Bladder wall appears thickened though this may be an artifact from collapsed state. Minimal prostatic enlargement. Post LEFT nephrectomy. Large soft tissue  mass LEFT upper quadrant 17.6 x 10.7 x 8.6 cm in size, mixed attenuation with areas of low-attenuation centrally and irregular soft tissue attenuation peripherally. Mass abuts anterior margin of spleen, lateral margin of stomach, and pancreatic tail but epicenter of mass appears external to these structures. Surrounding irregular infiltrative changes of LEFT upper quadrant fat with increased number of vessels. Overlying hernia of soft tissue and fat screw surgical defect in the LEFT flank abdominal wall without bowel herniation. This could represent lymphoma or potentially recurrent  renal carcinoma. Stomach/Bowel: Diverticulosis of sigmoid and descending colon without definite evidence of diverticulitis. Stomach decompressed limiting assessment of wall thickness. Bowel loops otherwise unremarkable. Appendix not definitely visualized. Vascular/Lymphatic: Mesenteric collaterals/varices particularly RIGHT of midline. Atherosclerotic calcifications aorta, iliac arteries, femoral arteries, coronary arteries, celiac artery, and splenic artery. Aorta normal caliber. No adenopathy. Reproductive: N/A Other: No free air or free fluid. Musculoskeletal: Diffuse osseous demineralization. Multiple thoracolumbar compression fractures with prior spinal augmentation procedures at 3 levels. IMPRESSION: Post LEFT nephrectomy. Marked interval increase in size of a soft tissue mass in the LEFT upper quadrant with question central necrosis, now measuring 17.6 x 10.7 x 8.6 cm, favor lymphoma or recurrent renal cell carcinoma ; while this mass abuts the pancreatic tail, lateral wall of stomach, and anterior margin of spleen, the lesion appears to have an epicenter external to these organs. LEFT flank hernia through nephrectomy scar. Chronic enlargement of pancreatic tail, by prior CT consisting of ductal dilatation and mild parenchymal prominence, less well visualized on current noncontrast technique exam. Distal colonic diverticulosis. Aortic atherosclerosis with additional atherosclerotic changes as above. Mesenteric collaterals/varices. Electronically Signed   By: Lavonia Dana M.D.   On: 05/04/2016 17:46   Dg Chest 2 View  Result Date: 05/04/2016 CLINICAL DATA:  82 year old male with fever. EXAM: CHEST  2 VIEW COMPARISON:  11/22/2015 FINDINGS: Lower lung volumes leading to bronchovascular crowding and accentuation of the heart size. There is tortuosity and atherosclerosis of the thoracic aorta, unchanged from prior allowing for differences in technique. Bibasilar atelectasis, no confluent airspace disease  allowing for low lung volumes. No pleural effusion or pneumothorax. Multiple vertebral compression deformities with kyphoplasty. IMPRESSION: Low lung volumes with bronchovascular crowding and bibasilar atelectasis. No evidence of focal pneumonia on low volume chest. Electronically Signed   By: Jeb Levering M.D.   On: 05/04/2016 19:54   Ct Chest W Contrast  Result Date: 05/05/2016 CLINICAL DATA:  Renal cancer.  Evaluate for metastatic disease. EXAM: CT CHEST WITH CONTRAST TECHNIQUE: Multidetector CT imaging of the chest was performed during intravenous contrast administration. CONTRAST:  5mL ISOVUE-300 IOPAMIDOL (ISOVUE-300) INJECTION 61% COMPARISON:  11/09/2006 FINDINGS: Cardiovascular: Heart is enlarged. No pericardial effusion. Coronary artery calcification is noted. Mediastinum/Nodes: Scattered upper normal size mediastinal lymph nodes are not substantially changed in the interval. One of the larger lymph nodes is a 10 mm short axis subcarinal node that is unchanged in the interval. There is no hilar lymphadenopathy. The esophagus has normal imaging features. There is no axillary lymphadenopathy. Lungs/Pleura: There is chronic scarring in the left lung base with volume loss and bronchiectasis, progressed in the interval. Superimposed atelectasis or progressive scar noted on today's exam. Superimposed airspace infection cannot be excluded. Volume loss with bronchiectasis noted posterior right lower lobe and in the lingula. No discrete pulmonary nodules to raise significant concern for pulmonary metastatic disease in this patient with a history of renal cell carcinoma. No substantial pleural effusion. Upper Abdomen: Large left upper quadrant heterogeneous soft tissue mass better  seen on recent CT scan of the abdomen and pelvis. Musculoskeletal: Patient is status post multilevel thoracolumbar vertebral augmentation with un treated compression deformity seen at the T11 vertebral body. IMPRESSION: 1. No  definite CT findings to suggest metastatic disease to the chest. 2. Bilateral lower lobe, left greater than right, collapse/consolidation with scarring. 3. Coronary artery atherosclerosis. Electronically Signed   By: Misty Stanley M.D.   On: 05/05/2016 10:31     PERTINENT LAB RESULTS: CBC:  Recent Labs  05/04/16 1617 05/05/16 0254  WBC 7.5 5.9  HGB 11.8* 10.4*  HCT 36.0* 30.8*  PLT 330 254   CMET CMP     Component Value Date/Time   NA 134 (L) 05/05/2016 0254   NA 139 10/15/2015 0952   K 3.9 05/05/2016 0254   CL 107 05/05/2016 0254   CO2 23 05/05/2016 0254   GLUCOSE 107 (H) 05/05/2016 0254   BUN 17 05/05/2016 0254   BUN 20 10/15/2015 0952   CREATININE 0.85 05/05/2016 0254   CALCIUM 7.7 (L) 05/05/2016 0254   PROT 5.6 (L) 05/05/2016 0254   PROT 6.7 10/15/2015 0952   ALBUMIN 2.0 (L) 05/05/2016 0254   ALBUMIN 4.0 10/15/2015 0952   AST 46 (H) 05/05/2016 0254   ALT 23 05/05/2016 0254   ALKPHOS 157 (H) 05/05/2016 0254   BILITOT 0.8 05/05/2016 0254   BILITOT 0.6 10/15/2015 0952   GFRNONAA >60 05/05/2016 0254   GFRAA >60 05/05/2016 0254    GFR Estimated Creatinine Clearance: 73.3 mL/min (by C-G formula based on SCr of 0.85 mg/dL).  Recent Labs  05/04/16 1617  LIPASE 100*   No results for input(s): CKTOTAL, CKMB, CKMBINDEX, TROPONINI in the last 72 hours. Invalid input(s): POCBNP No results for input(s): DDIMER in the last 72 hours. No results for input(s): HGBA1C in the last 72 hours. No results for input(s): CHOL, HDL, LDLCALC, TRIG, CHOLHDL, LDLDIRECT in the last 72 hours.  Recent Labs  05/04/16 2329  TSH 0.811   No results for input(s): VITAMINB12, FOLATE, FERRITIN, TIBC, IRON, RETICCTPCT in the last 72 hours. Coags:  Recent Labs  05/04/16 2329  INR 1.37   Microbiology: Recent Results (from the past 240 hour(s))  Urine culture     Status: None   Collection Time: 05/04/16  2:52 PM  Result Value Ref Range Status   Specimen Description URINE, CLEAN  CATCH  Final   Special Requests NONE  Final   Culture NO GROWTH Performed at [facility]   Final   Report Status 05/05/2016 FINAL  Final  Culture, blood (routine x 2)     Status: None (Preliminary result)   Collection Time: 05/04/16 11:29 PM  Result Value Ref Range Status   Specimen Description BLOOD RIGHT ANTECUBITAL  Final   Special Requests BOTTLES DRAWN AEROBIC AND ANAEROBIC 5ML EA  Final   Culture   Final    NO GROWTH < 12 HOURS Performed at [facility]    Report Status PENDING  Incomplete  Culture, blood (routine x 2)     Status: None (Preliminary result)   Collection Time: 05/04/16 11:37 PM  Result Value Ref Range Status   Specimen Description BLOOD LEFT ANTECUBITAL  Final   Special Requests BOTTLES DRAWN AEROBIC AND ANAEROBIC 5ML  Final   Culture   Final    NO GROWTH < 12 HOURS Performed at [facility]    Report Status PENDING  Incomplete    FURTHER DISCHARGE INSTRUCTIONS:  Get Medicines reviewed and adjusted: Please  take all your medications with you for your next visit with your Primary MD  Please request your Primary MD to go over all hospital tests and procedure/radiological results at the follow up, please ask your Primary MD to get all Hospital records sent to his/her office.  If you experience worsening of your admission symptoms, develop shortness of breath, life threatening emergency, suicidal or homicidal thoughts you must seek medical attention immediately by calling 911 or calling your MD immediately  if symptoms less severe.  You must read complete instructions/literature along with all the possible adverse reactions/side effects for all the Medicines you take and that have been prescribed to you. Take any new Medicines after you have completely understood and accpet all the possible adverse reactions/side effects.   Do not drive when taking Pain medications.   Do not take more than prescribed Pain, Sleep and Anxiety  Medications  Special Instructions: If you have smoked or chewed Tobacco  in the last 2 yrs please stop smoking, stop any regular Alcohol  and or any Recreational drug use.  Wear Seat belts while driving.  Please note  You were cared for by a hospitalist during your hospital stay. Once you are discharged, your primary care physician will handle any further medical issues. Please note that NO REFILLS for any discharge medications will be authorized once you are discharged, as it is imperative that you return to your primary care physician (or establish a relationship with a primary care physician if you do not have one) for your aftercare needs so that they can reassess your need for medications and monitor your lab values.   Total Time spent coordinating discharge including counseling, education and face to face time equals 45 minutes.  SignedOren Binet 05/06/2016 8:23 AM

## 2016-05-06 NOTE — Care Management Note (Signed)
Case Management Note  Patient Details  Name: SANTOS DELOZA MRN: [id number] Date of Birth: 01/05/1925  Subjective/Objective:Patient will d/c home by [facility]-confirmed address.Patient will have rw taken home by [facility] rep-Cory. HHC already set up. Nsg aware to cotnact [facility] tel#[phone].No further CM needs.                   Action/Plan:d/c home w/HHC/[facility]   Expected Discharge Date:   (unknown)               Expected Discharge Plan:  [place]  In-House Referral:     Discharge planning Services  CM Consult  Post Acute Care Choice:    Choice offered to:  Patient  DME Arranged:  [facility] D[facility] Agency:     HH Arranged:  RN, PT, Nurse's Aide, Social Work [employer] Agency:  [place]  Status of Service:  Completed, signed off  If discussed at [employer] of Stay Meetings, dates discussed:    Additional Comments:  Dessa Phi, RN 05/06/2016, 9:35 AM

## 2016-05-10 ENCOUNTER — Telehealth: Payer: Self-pay | Admitting: Family Medicine

## 2016-05-10 LAB — CULTURE, BLOOD (ROUTINE X 2)
CULTURE: NO GROWTH
Culture: NO GROWTH

## 2016-05-10 NOTE — Telephone Encounter (Signed)
I spoke with Buzz and gave below verbal order.

## 2016-05-10 NOTE — Telephone Encounter (Signed)
Buzz with [facility] needs orders for  Home health aid 2 wk /2  FYI:, pt made his own appointment today with Dr Rosana Hoes to have his foley removed.

## 2016-05-10 NOTE — Telephone Encounter (Signed)
Call in orders for the home health aide

## 2016-05-11 ENCOUNTER — Other Ambulatory Visit: Payer: Self-pay | Admitting: Physician Assistant

## 2016-05-11 ENCOUNTER — Other Ambulatory Visit: Payer: Self-pay | Admitting: General Surgery

## 2016-05-12 ENCOUNTER — Ambulatory Visit (HOSPITAL_COMMUNITY)
Admit: 2016-05-12 | Discharge: 2016-05-12 | Disposition: A | Discharge status: Home / Self Care | Payer: Medicare Other | Attending: Radiology | Admitting: Radiology

## 2016-05-12 ENCOUNTER — Ambulatory Visit (HOSPITAL_COMMUNITY)
Admit: 2016-05-12 | Discharge: 2016-05-12 | Disposition: A | Discharge status: Home / Self Care | Payer: Medicare Other | Attending: Oncology | Admitting: Oncology

## 2016-05-12 ENCOUNTER — Emergency Department (HOSPITAL_COMMUNITY): Payer: Medicare Other

## 2016-05-12 ENCOUNTER — Encounter (HOSPITAL_COMMUNITY): Payer: Self-pay

## 2016-05-12 ENCOUNTER — Other Ambulatory Visit: Payer: Self-pay

## 2016-05-12 ENCOUNTER — Inpatient Hospital Stay (HOSPITAL_COMMUNITY)
Admission: EM | Admit: 2016-05-12 | Discharge: 2016-05-25 | DRG: 193 | Disposition: A | Discharge status: Skilled Nursing Facility | Payer: Medicare Other | Attending: Internal Medicine | Admitting: Internal Medicine

## 2016-05-12 DIAGNOSIS — R402244 Coma scale, best verbal response, confused conversation, 24 hours or more after hospital admission: Secondary | ICD-10-CM | POA: Diagnosis not present

## 2016-05-12 DIAGNOSIS — D6859 Other primary thrombophilia: Secondary | ICD-10-CM | POA: Diagnosis present

## 2016-05-12 DIAGNOSIS — Y95 Nosocomial condition: Secondary | ICD-10-CM | POA: Diagnosis present

## 2016-05-12 DIAGNOSIS — Z9842 Cataract extraction status, left eye: Secondary | ICD-10-CM

## 2016-05-12 DIAGNOSIS — R000 Tachycardia, unspecified: Secondary | ICD-10-CM | POA: Diagnosis present

## 2016-05-12 DIAGNOSIS — I129 Hypertensive chronic kidney disease with stage 1 through stage 4 chronic kidney disease, or unspecified chronic kidney disease: Secondary | ICD-10-CM | POA: Diagnosis present

## 2016-05-12 DIAGNOSIS — J189 Pneumonia, unspecified organism: Secondary | ICD-10-CM | POA: Diagnosis not present

## 2016-05-12 DIAGNOSIS — E669 Obesity, unspecified: Secondary | ICD-10-CM | POA: Diagnosis present

## 2016-05-12 DIAGNOSIS — Z6834 Body mass index (BMI) 34.0-34.9, adult: Secondary | ICD-10-CM

## 2016-05-12 DIAGNOSIS — N189 Chronic kidney disease, unspecified: Secondary | ICD-10-CM | POA: Diagnosis present

## 2016-05-12 DIAGNOSIS — I639 Cerebral infarction, unspecified: Secondary | ICD-10-CM | POA: Diagnosis present

## 2016-05-12 DIAGNOSIS — G8929 Other chronic pain: Secondary | ICD-10-CM | POA: Diagnosis present

## 2016-05-12 DIAGNOSIS — I2510 Atherosclerotic heart disease of native coronary artery without angina pectoris: Secondary | ICD-10-CM | POA: Diagnosis present

## 2016-05-12 DIAGNOSIS — G9340 Encephalopathy, unspecified: Secondary | ICD-10-CM | POA: Diagnosis present

## 2016-05-12 DIAGNOSIS — N400 Enlarged prostate without lower urinary tract symptoms: Secondary | ICD-10-CM | POA: Diagnosis present

## 2016-05-12 DIAGNOSIS — R402144 Coma scale, eyes open, spontaneous, 24 hours or more after hospital admission: Secondary | ICD-10-CM | POA: Diagnosis not present

## 2016-05-12 DIAGNOSIS — Z7189 Other specified counseling: Secondary | ICD-10-CM

## 2016-05-12 DIAGNOSIS — R1902 Left upper quadrant abdominal swelling, mass and lump: Secondary | ICD-10-CM | POA: Diagnosis present

## 2016-05-12 DIAGNOSIS — R402364 Coma scale, best motor response, obeys commands, 24 hours or more after hospital admission: Secondary | ICD-10-CM | POA: Diagnosis not present

## 2016-05-12 DIAGNOSIS — Z66 Do not resuscitate: Secondary | ICD-10-CM | POA: Diagnosis present

## 2016-05-12 DIAGNOSIS — I63432 Cerebral infarction due to embolism of left posterior cerebral artery: Secondary | ICD-10-CM | POA: Diagnosis present

## 2016-05-12 DIAGNOSIS — Z515 Encounter for palliative care: Secondary | ICD-10-CM | POA: Diagnosis present

## 2016-05-12 DIAGNOSIS — K219 Gastro-esophageal reflux disease without esophagitis: Secondary | ICD-10-CM | POA: Diagnosis present

## 2016-05-12 DIAGNOSIS — I10 Essential (primary) hypertension: Secondary | ICD-10-CM | POA: Diagnosis present

## 2016-05-12 DIAGNOSIS — I248 Other forms of acute ischemic heart disease: Secondary | ICD-10-CM | POA: Diagnosis present

## 2016-05-12 DIAGNOSIS — I81 Portal vein thrombosis: Secondary | ICD-10-CM | POA: Diagnosis present

## 2016-05-12 DIAGNOSIS — Z87440 Personal history of urinary (tract) infections: Secondary | ICD-10-CM

## 2016-05-12 DIAGNOSIS — R4781 Slurred speech: Secondary | ICD-10-CM

## 2016-05-12 DIAGNOSIS — H53461 Homonymous bilateral field defects, right side: Secondary | ICD-10-CM | POA: Diagnosis present

## 2016-05-12 DIAGNOSIS — G7000 Myasthenia gravis without (acute) exacerbation: Secondary | ICD-10-CM | POA: Diagnosis present

## 2016-05-12 DIAGNOSIS — Z7902 Long term (current) use of antithrombotics/antiplatelets: Secondary | ICD-10-CM

## 2016-05-12 DIAGNOSIS — I471 Supraventricular tachycardia: Secondary | ICD-10-CM | POA: Diagnosis not present

## 2016-05-12 DIAGNOSIS — I6340 Cerebral infarction due to embolism of unspecified cerebral artery: Secondary | ICD-10-CM

## 2016-05-12 DIAGNOSIS — R109 Unspecified abdominal pain: Secondary | ICD-10-CM | POA: Diagnosis present

## 2016-05-12 DIAGNOSIS — Z881 Allergy status to other antibiotic agents status: Secondary | ICD-10-CM

## 2016-05-12 DIAGNOSIS — Z800 Family history of malignant neoplasm of digestive organs: Secondary | ICD-10-CM

## 2016-05-12 DIAGNOSIS — F419 Anxiety disorder, unspecified: Secondary | ICD-10-CM | POA: Diagnosis present

## 2016-05-12 DIAGNOSIS — C799 Secondary malignant neoplasm of unspecified site: Secondary | ICD-10-CM | POA: Diagnosis present

## 2016-05-12 DIAGNOSIS — C7889 Secondary malignant neoplasm of other digestive organs: Secondary | ICD-10-CM | POA: Diagnosis present

## 2016-05-12 DIAGNOSIS — Z79899 Other long term (current) drug therapy: Secondary | ICD-10-CM

## 2016-05-12 DIAGNOSIS — Z955 Presence of coronary angioplasty implant and graft: Secondary | ICD-10-CM

## 2016-05-12 DIAGNOSIS — D649 Anemia, unspecified: Secondary | ICD-10-CM | POA: Diagnosis present

## 2016-05-12 DIAGNOSIS — I82890 Acute embolism and thrombosis of other specified veins: Secondary | ICD-10-CM | POA: Diagnosis present

## 2016-05-12 DIAGNOSIS — D735 Infarction of spleen: Secondary | ICD-10-CM | POA: Diagnosis present

## 2016-05-12 DIAGNOSIS — Z9841 Cataract extraction status, right eye: Secondary | ICD-10-CM

## 2016-05-12 DIAGNOSIS — Z87891 Personal history of nicotine dependence: Secondary | ICD-10-CM

## 2016-05-12 DIAGNOSIS — Z86711 Personal history of pulmonary embolism: Secondary | ICD-10-CM

## 2016-05-12 DIAGNOSIS — Z85528 Personal history of other malignant neoplasm of kidney: Secondary | ICD-10-CM

## 2016-05-12 DIAGNOSIS — M1990 Unspecified osteoarthritis, unspecified site: Secondary | ICD-10-CM | POA: Diagnosis present

## 2016-05-12 DIAGNOSIS — R233 Spontaneous ecchymoses: Secondary | ICD-10-CM | POA: Diagnosis not present

## 2016-05-12 DIAGNOSIS — I82401 Acute embolism and thrombosis of unspecified deep veins of right lower extremity: Secondary | ICD-10-CM | POA: Diagnosis present

## 2016-05-12 DIAGNOSIS — M810 Age-related osteoporosis without current pathological fracture: Secondary | ICD-10-CM | POA: Diagnosis present

## 2016-05-12 DIAGNOSIS — Z96653 Presence of artificial knee joint, bilateral: Secondary | ICD-10-CM | POA: Diagnosis present

## 2016-05-12 DIAGNOSIS — C642 Malignant neoplasm of left kidney, except renal pelvis: Secondary | ICD-10-CM | POA: Diagnosis present

## 2016-05-12 DIAGNOSIS — Z905 Acquired absence of kidney: Secondary | ICD-10-CM

## 2016-05-12 LAB — URINALYSIS, ROUTINE W REFLEX MICROSCOPIC
Bilirubin Urine: NEGATIVE
Glucose, UA: NEGATIVE mg/dL
Ketones, ur: NEGATIVE mg/dL
LEUKOCYTES UA: NEGATIVE
NITRITE: NEGATIVE
PROTEIN: NEGATIVE mg/dL
pH: 5.5 (ref 5.0–8.0)

## 2016-05-12 LAB — CBC WITH DIFFERENTIAL/PLATELET
Basophils Absolute: 0 10*3/uL (ref 0.0–0.1)
Basophils Relative: 0 %
Eosinophils Absolute: 0 10*3/uL (ref 0.0–0.7)
Eosinophils Relative: 1 %
HCT: 32.9 % — ABNORMAL LOW (ref 39.0–52.0)
Hemoglobin: 11 g/dL — ABNORMAL LOW (ref 13.0–17.0)
Lymphocytes Relative: 6 %
Lymphs Abs: 0.6 10*3/uL — ABNORMAL LOW (ref 0.7–4.0)
MCH: 30.2 pg (ref 26.0–34.0)
MCHC: 33.4 g/dL (ref 30.0–36.0)
MCV: 90.4 fL (ref 78.0–100.0)
Monocytes Absolute: 0.6 10*3/uL (ref 0.1–1.0)
Monocytes Relative: 6 %
Neutro Abs: 7.7 10*3/uL (ref 1.7–7.7)
Neutrophils Relative %: 87 %
Platelets: 324 10*3/uL (ref 150–400)
RBC: 3.64 MIL/uL — ABNORMAL LOW (ref 4.22–5.81)
RDW: 15.4 % (ref 11.5–15.5)
WBC: 8.8 10*3/uL (ref 4.0–10.5)

## 2016-05-12 LAB — BASIC METABOLIC PANEL
Anion gap: 10 (ref 5–15)
BUN: 14 mg/dL (ref 6–20)
CO2: 22 mmol/L (ref 22–32)
Calcium: 8.1 mg/dL — ABNORMAL LOW (ref 8.9–10.3)
Chloride: 105 mmol/L (ref 101–111)
Creatinine, Ser: 0.69 mg/dL (ref 0.61–1.24)
GFR calc Af Amer: >60 mL/min (ref >60)
GLUCOSE: 97 mg/dL (ref 65–99)
POTASSIUM: 4 mmol/L (ref 3.5–5.1)
Sodium: 137 mmol/L (ref 135–145)

## 2016-05-12 LAB — URINE MICROSCOPIC-ADD ON

## 2016-05-12 LAB — PROTIME-INR
INR: 1.26
PROTHROMBIN TIME: 15.9 s — AB (ref 11.4–15.2)

## 2016-05-12 LAB — CBC
HEMATOCRIT: 33.6 % — AB (ref 39.0–52.0)
HEMOGLOBIN: 11.2 g/dL — AB (ref 13.0–17.0)
MCH: 30 pg (ref 26.0–34.0)
MCHC: 33.3 g/dL (ref 30.0–36.0)
MCV: 90.1 fL (ref 78.0–100.0)
Platelets: 339 10*3/uL (ref 150–400)
RBC: 3.73 MIL/uL — AB (ref 4.22–5.81)
RDW: 15.7 % — ABNORMAL HIGH (ref 11.5–15.5)
WBC: 11.9 10*3/uL — AB (ref 4.0–10.5)

## 2016-05-12 LAB — TROPONIN I
TROPONIN I: 0.1 ng/mL — AB (ref <0.03)
Troponin I: 0.11 ng/mL (ref <0.03)

## 2016-05-12 LAB — MAGNESIUM: MAGNESIUM: 1.7 mg/dL (ref 1.7–2.4)

## 2016-05-12 LAB — APTT: APTT: 34 s (ref 24–36)

## 2016-05-12 MED ORDER — POLYETHYLENE GLYCOL 3350 17 G PO PACK: 17.0000 g | PACK | Freq: Every day | ORAL | Status: DC | PRN

## 2016-05-12 MED ORDER — MIDAZOLAM HCL 2 MG/2ML IJ SOLN
INTRAMUSCULAR | Status: AC | PRN
  Administered 2016-05-12 (×2): 0.5 mg via INTRAVENOUS

## 2016-05-12 MED ORDER — SENNA 8.6 MG PO TABS: 2.0000 | ORAL_TABLET | Freq: Every evening | ORAL | Status: DC | PRN

## 2016-05-12 MED ORDER — PYRIDOSTIGMINE BROMIDE 60 MG PO TABS
60.0000 mg | ORAL_TABLET | Freq: Four times a day (QID) | ORAL | Status: DC
  Administered 2016-05-12 – 2016-05-15 (×9): 60 mg via ORAL
  Filled 2016-05-12 (×12): qty 1

## 2016-05-12 MED ORDER — METOPROLOL TARTRATE 25 MG PO TABS
50.0000 mg | ORAL_TABLET | Freq: Once | ORAL | Status: AC
  Administered 2016-05-12: 50 mg via ORAL
  Filled 2016-05-12: qty 2

## 2016-05-12 MED ORDER — IPRATROPIUM BROMIDE 0.02 % IN SOLN
0.5000 mg | Freq: Three times a day (TID) | RESPIRATORY_TRACT | Status: DC
  Administered 2016-05-13: 0.5 mg via RESPIRATORY_TRACT
  Filled 2016-05-12: qty 2.5

## 2016-05-12 MED ORDER — FENTANYL CITRATE (PF) 100 MCG/2ML IJ SOLN
INTRAMUSCULAR | Status: AC | PRN
  Administered 2016-05-12 (×2): 25 ug via INTRAVENOUS

## 2016-05-12 MED ORDER — LEVALBUTEROL HCL 1.25 MG/0.5ML IN NEBU
1.2500 mg | INHALATION_SOLUTION | Freq: Three times a day (TID) | RESPIRATORY_TRACT | Status: DC
  Filled 2016-05-12: qty 0.5

## 2016-05-12 MED ORDER — TAMSULOSIN HCL 0.4 MG PO CAPS
0.4000 mg | ORAL_CAPSULE | Freq: Every day | ORAL | Status: DC
  Administered 2016-05-12 – 2016-05-25 (×14): 0.4 mg via ORAL
  Filled 2016-05-12 (×14): qty 1

## 2016-05-12 MED ORDER — DEXTROSE 5 % IV SOLN
1.0000 g | Freq: Three times a day (TID) | INTRAVENOUS | Status: AC
  Administered 2016-05-13 – 2016-05-19 (×21): 1 g via INTRAVENOUS
  Filled 2016-05-12 (×23): qty 1

## 2016-05-12 MED ORDER — IPRATROPIUM BROMIDE 0.02 % IN SOLN
0.5000 mg | Freq: Four times a day (QID) | RESPIRATORY_TRACT | Status: DC
  Administered 2016-05-12: 0.5 mg via RESPIRATORY_TRACT
  Filled 2016-05-12: qty 2.5

## 2016-05-12 MED ORDER — DEXTROSE 5 % IV SOLN
2.0000 g | Freq: Once | INTRAVENOUS | Status: AC
  Administered 2016-05-12: 2 g via INTRAVENOUS
  Filled 2016-05-12: qty 2

## 2016-05-12 MED ORDER — VANCOMYCIN HCL IN DEXTROSE 750-5 MG/150ML-% IV SOLN
750.0000 mg | Freq: Two times a day (BID) | INTRAVENOUS | Status: DC
  Administered 2016-05-13 – 2016-05-14 (×4): 750 mg via INTRAVENOUS
  Filled 2016-05-12 (×5): qty 150

## 2016-05-12 MED ORDER — AMOXICILLIN-POT CLAVULANATE 250-125 MG PO TABS: 1.0000 | ORAL_TABLET | Freq: Three times a day (TID) | ORAL | Status: DC

## 2016-05-12 MED ORDER — SODIUM CHLORIDE 0.9 % IV BOLUS (SEPSIS)
1000.0000 mL | Freq: Once | INTRAVENOUS | Status: AC
  Administered 2016-05-12: 1000 mL via INTRAVENOUS

## 2016-05-12 MED ORDER — SODIUM CHLORIDE 0.9 % IV SOLN
INTRAVENOUS | Status: DC
  Administered 2016-05-12: 10:00:00 via INTRAVENOUS

## 2016-05-12 MED ORDER — MYCOPHENOLATE MOFETIL 250 MG PO CAPS
500.0000 mg | ORAL_CAPSULE | Freq: Every day | ORAL | Status: DC
  Administered 2016-05-13 – 2016-05-15 (×3): 500 mg via ORAL
  Administered 2016-05-16: 250 mg via ORAL
  Administered 2016-05-17 – 2016-05-25 (×9): 500 mg via ORAL
  Filled 2016-05-12 (×13): qty 2

## 2016-05-12 MED ORDER — VANCOMYCIN HCL 10 G IV SOLR
2000.0000 mg | Freq: Once | INTRAVENOUS | Status: AC
  Administered 2016-05-13: 2000 mg via INTRAVENOUS
  Filled 2016-05-12: qty 2000

## 2016-05-12 MED ORDER — MAGNESIUM SULFATE 2 GM/50ML IV SOLN
2.0000 g | Freq: Once | INTRAVENOUS | Status: AC
  Administered 2016-05-12: 2 g via INTRAVENOUS
  Filled 2016-05-12: qty 50

## 2016-05-12 MED ORDER — POTASSIUM CHLORIDE IN NACL 20-0.9 MEQ/L-% IV SOLN
INTRAVENOUS | Status: AC
  Administered 2016-05-12: 23:00:00 via INTRAVENOUS
  Filled 2016-05-12: qty 1000

## 2016-05-12 MED ORDER — HYDROCODONE-ACETAMINOPHEN 5-325 MG PO TABS
1.0000 | ORAL_TABLET | ORAL | Status: DC | PRN
  Filled 2016-05-12: qty 2

## 2016-05-12 MED ORDER — IOPAMIDOL (ISOVUE-300) INJECTION 61%
100.0000 mL | Freq: Once | INTRAVENOUS | Status: AC | PRN
  Administered 2016-05-12: 100 mL via INTRAVENOUS

## 2016-05-12 MED ORDER — MYCOPHENOLATE MOFETIL 250 MG PO CAPS
1000.0000 mg | ORAL_CAPSULE | Freq: Every day | ORAL | Status: DC
  Administered 2016-05-12 – 2016-05-24 (×12): 1000 mg via ORAL
  Filled 2016-05-12 (×14): qty 4

## 2016-05-12 MED ORDER — FENTANYL CITRATE (PF) 100 MCG/2ML IJ SOLN
INTRAMUSCULAR | Status: AC
  Filled 2016-05-12: qty 4

## 2016-05-12 MED ORDER — SODIUM CHLORIDE 0.9% FLUSH
3.0000 mL | Freq: Two times a day (BID) | INTRAVENOUS | Status: DC
Start: 2016-05-12 — End: 2016-05-25
  Administered 2016-05-12 – 2016-05-24 (×11): 3 mL via INTRAVENOUS

## 2016-05-12 MED ORDER — MIDAZOLAM HCL 2 MG/2ML IJ SOLN
INTRAMUSCULAR | Status: AC
  Filled 2016-05-12: qty 6

## 2016-05-12 MED ORDER — LEVALBUTEROL HCL 0.63 MG/3ML IN NEBU
0.6300 mg | INHALATION_SOLUTION | Freq: Four times a day (QID) | RESPIRATORY_TRACT | Status: DC | PRN
  Administered 2016-05-13 – 2016-05-16 (×2): 0.63 mg via RESPIRATORY_TRACT
  Filled 2016-05-12 (×2): qty 3

## 2016-05-12 MED ORDER — METOPROLOL SUCCINATE ER 25 MG PO TB24
50.0000 mg | ORAL_TABLET | Freq: Every day | ORAL | Status: DC
  Administered 2016-05-13 – 2016-05-25 (×11): 50 mg via ORAL
  Filled 2016-05-12 (×2): qty 1
  Filled 2016-05-12: qty 2
  Filled 2016-05-12 (×2): qty 1
  Filled 2016-05-12 (×6): qty 2
  Filled 2016-05-12: qty 1
  Filled 2016-05-12: qty 2

## 2016-05-12 MED ORDER — ADULT MULTIVITAMIN W/MINERALS CH
1.0000 | ORAL_TABLET | Freq: Every day | ORAL | Status: DC
  Administered 2016-05-13 – 2016-05-25 (×13): 1 via ORAL
  Filled 2016-05-12 (×13): qty 1

## 2016-05-12 MED ORDER — LEVALBUTEROL HCL 1.25 MG/0.5ML IN NEBU
1.2500 mg | INHALATION_SOLUTION | Freq: Four times a day (QID) | RESPIRATORY_TRACT | Status: DC
  Administered 2016-05-12: 1.25 mg via RESPIRATORY_TRACT
  Filled 2016-05-12 (×4): qty 0.5

## 2016-05-12 NOTE — ED Notes (Signed)
He is lightly sleeping and is easily aroused and has no c/o at this time. Monitor shows sinus tach. He remains in no distress.

## 2016-05-12 NOTE — Progress Notes (Signed)
Patient ID: [name], male   DOB: 01-Oct-1925, 82 y.o.   MRN: [id number] Late entry:  Called to see the patient in [facility] around 1330 due to tachycardia.  A stat EKG was ordered that revealed SVT.  Stat troponin was ordered, but not done prior to transfer to the ED.  The patient was asymptomatic and still sleepy from his sedation medications.  He would intermittently shake, likely secondary to sedation medications.  This resolved prior to me leaving him in the ED within the next 30 minutes.  The patient denies CP, SOB, pressure in his chest, nausea, vomiting, abdominal pain.  I spoke to a hospitalist about direct admission, but given the concern for EKG findings of SVT, which was new, he recommended the patient go straight to the ED for further evaluation.    PE: Gen: NAD, sleeping comfortably at times Heart: tachycardic Lungs: CTAB Abd: soft, NT, ND, site is clean with no evidence of bleeding  A/P: New onset SVT  -the patient is transported to the ED for further evaluation.  His wife has been called and updated along with his sister who is at the bedside.  [name] E 05/12/2016

## 2016-05-12 NOTE — ED Notes (Signed)
He remains in no distress.  Dr. Stark Jock is speaking with pt. And his visiting (twin) sisters as I write this.

## 2016-05-12 NOTE — Procedures (Signed)
Interventional Radiology Procedure Note  Procedure: CT guided biopsy of left abdominal mass  Complications: None  Estimated Blood Loss: 0  Recommendations: - path pending - bedrest x 1 hr - DC home   Signed,  Criselda Peaches, MD

## 2016-05-12 NOTE — H&P (Signed)
History and Physical    [name] [id number] DOB: 1925-07-01 DOA: 05/12/2016  PCP: [name], MD   Patient coming from: Hospital.  Chief Complaint: Tachycardia.  HPI: [name] is a 82 y.o. male with medical history significant of osteoarthritis, BPH, renal carcinoma, chronic kidney disease, coronary artery disease, diverticulitis, GERD, hypertension, myasthenia gravis, obesity, osteoporosis, recently treated for UTI per patient's sisters, who was transferred from to the ER from the radiology department after the patient developed sinus tachycardia in the 140s and 150s after getting a biopsy for LUQ mass with interventional radiology. He denies chest pain, dyspnea at that moment, palpitations, dizziness, diaphoresis, pitting edema of the lower extremities. The patient did not take his metoprolol this morning and did not sleep much last night due to anxiety in anticipation of the procedure.  On review of systems, the patient has been having progressively worse productive cough of yellowish sputum of 7 days or more, mild dyspnea, fatigue, chills, but denies fever, wheezing, sore throat or rhinorrhea. He has chronic abdominal pain, but denies nausea, emesis, diarrhea or recent constipation. He just recently finished a course of Augmentin, apparently for UTI.   ED Course: The patient was given IV fluids and 50 mg of metoprolol orally. His heart rate is currently in the 80s. Work up shows EKG with sinus tachycardia, elevated troponin at 0.11 ng/mL, hemoglobin level of 11.0 g/dL. Chest radiograph showed left lower lobe infiltrate versus atelectasis.  Review of Systems: As per HPI otherwise 10 point review of systems negative.    Past Medical History:  Diagnosis Date  . Arthritis    back, hips, knees   . Benign prostatic hypertrophy   . Cancer [facility])    h/o renal carcinoma   . Chronic kidney disease    h/o UTI-2012  . Coronary artery disease    sees Dr. Shelva Majestic   .  Diverticulitis   . GERD (gastroesophageal reflux disease)   . H/O epistaxis   . Heart disease   . Hypertension   . Myasthenia gravis [facility])    sees Dr. Jannifer Franklin  . Obesity    exogenous  . Osteoporosis   . Partial small bowel obstruction ([place]) 04/16/10   resolved with bowel rest  . Pulmonary embolus ([place])    2000  . Shortness of breath   . Urinary incontinence   . UTI (urinary tract infection)     Past Surgical History:  Procedure Laterality Date  . ANGIOPLASTY     had 2 stent replacements  . CARDIAC CATHETERIZATION     2008, stents   . CARDIAC CATHETERIZATION  2008  . COLONOSCOPY  05/28/10   per Dr. Laurence Spates, diverticulosis, no repeats planned   . DOPPLER ECHOCARDIOGRAPHY    . EYE SURGERY     cataracts(bilateral)  removed, ?iol  . JOINT REPLACEMENT     2005-R, L knee replacement- 2000  . NEPHRECTOMY     left  . NM MYOVIEW LTD  02/2011   moderate inferior scar no ischemia  . SP KYPHOPLASTY     and vertebroplasty  . stented placed  1/08  . stress dipyridamole myocardial perfusion    . TONSILLECTOMY    . TOTAL KNEE ARTHROPLASTY     both Sees Dr. Novella Olive  . TOTAL KNEE REVISION  03/28/2012   Procedure: TOTAL KNEE REVISION;  Surgeon: Hessie Dibble, MD;  Location: [place];  Service: Orthopedics;  Laterality: Right;     reports that he quit smoking about  57 years ago. His smoking use included Cigarettes. He has never used smokeless tobacco. He reports that he does not drink alcohol or use drugs.  Allergies  Allergen Reactions  . Dicyclomine Other (See Comments)    REACTION: can worsen myasthenia gravis    Family History  Problem Relation Age of Onset  . Colon cancer Mother   Family history reviewed  Prior to Admission medications   Medication Sig Start Date End Date Taking? Authorizing Provider  amoxicillin-clavulanate (AUGMENTIN) 250-125 MG tablet Take 1 tablet by mouth 3 (three) times daily.   Yes Historical Provider, MD  clopidogrel (PLAVIX) 75 MG tablet  Take 75 mg by mouth daily.   Yes Historical Provider, MD  metoprolol succinate (TOPROL-XL) 50 MG 24 hr tablet TAKE 1 TABLET ONCE DAILY. 10/24/15  Yes Troy Sine, MD  Multiple Vitamin (MULTIVITAMIN WITH MINERALS) TABS Take 1 tablet by mouth daily.   Yes Historical Provider, MD  mycophenolate (CELLCEPT) 500 MG tablet TAKE 1 TABLET EVERY MORNING AND 2 TABLETS AT BEDTIME. 04/16/16  Yes Kathrynn Ducking, MD  polyethylene glycol [facility] / GLYCOLAX) packet Take 17 g by mouth daily. Patient taking differently: Take 17 g by mouth daily as needed for moderate constipation.  05/06/16  Yes Shanker Kristeen Mans, MD  pyridostigmine (MESTINON) 60 MG tablet TAKE  (1)  TABLET  FOUR TIMES DAILY. Patient taking differently: TAKE  (1)  TABLET TWO  TIMES DAILY. 03/09/16  Yes Kathrynn Ducking, MD  tamsulosin (FLOMAX) 0.4 MG CAPS capsule Take 1 capsule (0.4 mg total) by mouth daily. 05/06/16  Yes Shanker Kristeen Mans, MD  phenazopyridine (PYRIDIUM) 200 MG tablet Take 1 tablet (200 mg total) by mouth 3 (three) times daily. Patient not taking: Reported on 05/12/2016 01/31/16   Melony Overly, MD  rosuvastatin (CRESTOR) 10 MG tablet Take 1 tablet (10 mg total) by mouth daily. Patient not taking: Reported on 05/12/2016 10/07/15   Troy Sine, MD    Physical Exam: Vitals:   05/12/16 1615 05/12/16 1700 05/12/16 1915 05/12/16 2015  BP: 148/81 134/81 103/63 133/65  Pulse: 109 87 81 86  Resp: 25 20 22 24   Temp:      TempSrc:      SpO2: 95% 95% 95% 93%      Constitutional: NAD, calm, comfortable Vitals:   05/12/16 1615 05/12/16 1700 05/12/16 1915 05/12/16 2015  BP: 148/81 134/81 103/63 133/65  Pulse: 109 87 81 86  Resp: 25 20 22 24   Temp:      TempSrc:      SpO2: 95% 95% 95% 93%   Eyes: PERRL, lids and conjunctivae normal ENMT: Mucous membranes are dry. Posterior pharynx clear of any exudate or lesions.Dentition is in moderate state of repair. Neck: normal, supple, no masses, no thyromegaly Respiratory: clear to  auscultation bilaterally, no wheezing, no crackles. Decreased respiratory effort. No accessory muscle use.  Cardiovascular: Regular rate and rhythm, no murmurs / rubs / gallops. No extremity edema. 2+ pedal pulses. No carotid bruits.  Abdomen: LUQ dressing in place and clear, no discharge or hemorrhage noticed. BS positive. Soft, LUQ tenderness, no guarding/rebound. palpated. No hepatosplenomegaly.   Musculoskeletal: no clubbing / cyanosis. No contractures. Normal muscle tone.  Skin: Hyperpigmented plaque and periumbilical area. Unable to examine posterior torso due to patient's weakness and inability to turn without discomfort. Neurologic: CN 2-12 grossly intact. Moves all extremities. Psychiatric: Alert and oriented to self, partially oriented to place and situation, knows he is in the hospital, but thought  he was in [place]. Disoriented to date   Labs on Admission: I have personally reviewed following labs and imaging studies  CBC:  Recent Labs Lab 05/12/16 0920 05/12/16 1500  WBC 11.9* 8.8  NEUTROABS  --  7.7  HGB 11.2* 11.0*  HCT 33.6* 32.9*  MCV 90.1 90.4  PLT 339 0000000   Basic Metabolic Panel:  Recent Labs Lab 05/12/16 1500  NA 137  K 4.0  CL 105  CO2 22  GLUCOSE 97  BUN 14  CREATININE 0.69  CALCIUM 8.1*  MG 1.7   GFR: Estimated Creatinine Clearance: 77.9 mL/min (by C-G formula based on SCr of 0.8 mg/dL).  Coagulation Profile:  Recent Labs Lab 05/12/16 0920  INR 1.26   Cardiac Enzymes:  Recent Labs Lab 05/12/16 1500  TROPONINI 0.11*   CBG:  Recent Labs Lab 05/06/16 0731  GLUCAP 99   Urine analysis:    Component Value Date/Time   COLORURINE AMBER (A) 05/12/2016 1720   APPEARANCEUR CLEAR 05/12/2016 1720   LABSPEC >1.046 (H) 05/12/2016 1720   PHURINE 5.5 05/12/2016 1720   GLUCOSEU NEGATIVE 05/12/2016 1720   HGBUR MODERATE (A) 05/12/2016 1720   HGBUR negative 08/13/2009 1549   BILIRUBINUR NEGATIVE 05/12/2016 1720   KETONESUR NEGATIVE  05/12/2016 1720   PROTEINUR NEGATIVE 05/12/2016 1720   UROBILINOGEN 0.2 01/28/2016 1510   NITRITE NEGATIVE 05/12/2016 1720   LEUKOCYTESUR NEGATIVE 05/12/2016 1720   Recent Results (from the past 240 hour(s))  Urine culture     Status: None   Collection Time: 05/04/16  2:52 PM  Result Value Ref Range Status   Specimen Description URINE, CLEAN CATCH  Final   Special Requests NONE  Final   Culture NO GROWTH Performed at [facility]   Final   Report Status 05/05/2016 FINAL  Final  Culture, blood (routine x 2)     Status: None   Collection Time: 05/04/16 11:29 PM  Result Value Ref Range Status   Specimen Description BLOOD RIGHT ANTECUBITAL  Final   Special Requests BOTTLES DRAWN AEROBIC AND ANAEROBIC 5ML EA  Final   Culture   Final    NO GROWTH 5 DAYS Performed at [facility]    Report Status 05/10/2016 FINAL  Final  Culture, blood (routine x 2)     Status: None   Collection Time: 05/04/16 11:37 PM  Result Value Ref Range Status   Specimen Description BLOOD LEFT ANTECUBITAL  Final   Special Requests BOTTLES DRAWN AEROBIC AND ANAEROBIC 5ML  Final   Culture   Final    NO GROWTH 5 DAYS Performed at [facility]    Report Status 05/10/2016 FINAL  Final     Radiological Exams on Admission: Dg Chest 2 View  Result Date: 05/12/2016 CLINICAL DATA:  83 year old male with weakness and tachycardia. EXAM: CHEST  2 VIEW COMPARISON:  05/04/2016 and prior radiographs FINDINGS: This is a low volume film. Increased left lower lung opacity may represent atelectasis or airspace disease/ pneumonia. Mild pulmonary vascular congestion is noted. Upper limits normal heart size noted. No pneumothorax or definite pleural effusion noted. No acute bony abnormalities are identified. IMPRESSION: Low volume film with increased left lower lung opacity-question atelectasis versus airspace disease. Mild pulmonary vascular congestion. Electronically Signed   By: Margarette Canada M.D.   On:  05/12/2016 18:05   Ct Head Wo Contrast  Result Date: 05/12/2016 CLINICAL DATA:  Weakness. EXAM: CT HEAD WITHOUT CONTRAST TECHNIQUE: Contiguous axial images were obtained from the base of the  skull through the vertex without intravenous contrast. COMPARISON:  11/22/2013 FINDINGS: Brain: No evidence of acute infarction, hemorrhage, hydrocephalus, or mass lesion/mass effect. Remote small bilateral cerebellar infarcts which appears stable from previous. Remote right posterior temporal and parietal cortically based infarct. Small remote bilateral occipital and right posterior frontal cortical infarcts. Small-vessel ischemic change in the cerebral white matter. Vascular: Atherosclerotic calcification. Opacified vessels from recent enhanced abdominal CT. Skull: Negative for fracture or focal lesion. Sinuses/Orbits: Left sphenoid sinusitis is chronic based on prior, with fluid level currently. Bilateral cataract resection Other: None. IMPRESSION: 1. No acute intracranial finding or change compared to 2015. 2. Numerous remote infarcts as described above. 3. Chronic left sphenoid sinusitis.  Fluid level is present today. Electronically Signed   By: Monte Fantasia M.D.   On: 05/12/2016 18:18   Ct Abdomen Pelvis W Contrast  Result Date: 05/12/2016 CLINICAL DATA:  had a biopsy of a left upper quadrant mass today. tachycardia (he was in the 140-150 range after procedure); he arrives here drowsy and in no distress with a heart rate of ~125 and is a regular supraventricular tach. Family are with him also. He is drowsy and is slow to arouse and answers all questions regarding his situation lucidly. He has an indwelling foley cath. In place and a sm. Clean dressing at left lower rib area EXAM: CT ABDOMEN AND PELVIS WITH CONTRAST TECHNIQUE: Multidetector CT imaging of the abdomen and pelvis was performed using the standard protocol following bolus administration of intravenous contrast. CONTRAST:  143mL ISOVUE-300 IOPAMIDOL  (ISOVUE-300) INJECTION 61% COMPARISON:  Biopsy images from earlier the same day, and prior studies FINDINGS: Lower chest: Trace bilateral pleural effusions. Consolidation/ atelectasis posteriorly in the visualized lower lobes. Coronary calcifications. Hepatobiliary: Fatty liver without focal lesion. Partially calcified stones in the nondilated gallbladder. Pancreas: Mild atrophy in the mid body and head. There is masslike enlargement of pancreatic tail and a contiguous complex 17.8 cm mass with peripheral enhancement, central low-attenuation, stable compared to scans dating back to 05/04/2016. No evidence of hematoma or active extravasation. Spleen: The left upper quadrant mass abuts the splenic hilum. There are small wedge-shaped areas of decreased enhancement in the spleen suggesting direct involvement by the mass. Splenic vein thrombosis. Adrenals/Urinary Tract: Previous left nephrectomy. Right adrenal gland and kidney unremarkable. Foley catheter decompresses urinary bladder. Stomach/Bowel: Stomach is decompressed, displaced medially by at the left upper quadrant mass without definite invasion. Small bowel and colon are nondilated. Innumerable sigmoid diverticula without adjacent inflammatory/edematous change. Vascular/Lymphatic: Moderate aortoiliac arterial calcifications without aneurysm or stenosis. Cavernous transformation of the portal vein. Apparent thrombosis of the splenic vein. SMV remains patent. Reproductive: No mass or other significant abnormality. Other: Scattered low-attenuation ascites in the pelvis and pericolic gutters left greater than right. No suggestion of hemoperitoneum. Musculoskeletal: Left lateral body wall hernia containing only fat. Multiple stable lower thoracic and lumbar compression deformities, post vertebral augmentation at T10 and L1. IMPRESSION: 1. Negative for hemorrhage or other acute complication of left upper quadrant mass biopsy. Critical Value/emergent results were called  by telephone at the time of interpretation on 05/12/2016 at 5:03 pm to Dr. Laurence Ferrari, who verbally acknowledged these results. 2. Stable left upper quadrant mass abutting the pancreas, probably invading the splenic hilum, with splenic and portal vein thrombosis. 3. Additional ancillary findings as above, stable since previous exam. Electronically Signed   By: Lucrezia Europe M.D.   On: 05/12/2016 17:04   Ct Biopsy  Result Date: 05/12/2016 INDICATION: 82 year old male with a prior history  of renal cell carcinoma and a large left upper quadrant mass concerning for recurrent disease. He presents for biopsy. EXAM: CT BIOPSY MEDICATIONS: None. ANESTHESIA/SEDATION: Moderate (conscious) sedation was employed during this procedure. A total of Versed 1 mg and Fentanyl 50 mcg was administered intravenously. Moderate Sedation Time: 10 minutes. The patient's level of consciousness and vital signs were monitored continuously by radiology nursing throughout the procedure under my direct supervision. FLUOROSCOPY TIME:  None COMPLICATIONS: None immediate. PROCEDURE: Informed written consent was obtained from the patient after a thorough discussion of the procedural risks, benefits and alternatives. All questions were addressed. A timeout was performed prior to the initiation of the procedure. A planning axial CT scan was performed. The large left upper quadrant abdominal mass was identified. A suitable skin entry site was selected and marked. The region was sterilely prepped and draped in standard fashion using chlorhexidine skin prep. Local anesthesia was attained by infiltration with 1% lidocaine. A small dermatotomy was made. Using intermittent CT guidance, a 19 gauge introducer needle was advanced into the margin of the mass. The introducer needle was positioned to biopsy needle rind of the lesion has centrally it is low attenuation and likely necrotic. Multiple 18 gauge core biopsies were then coaxially obtained using the bio  Pince automated biopsy device. Biopsy specimens were placed in saline and delivered to pathology for further analysis. Following removal of the biopsy device and introducer needle, and axial CT scan was performed which demonstrates no hematoma or other complicating feature. The patient tolerated the procedure well. IMPRESSION: Technically successful CT-guided biopsy of left upper quadrant abdominal mass. Of note, the mass is centrally necrotic. Efforts were made to biopsy the peripheral rim of the lesion in hopes of obtaining viable tissue for diagnosis. Electronically Signed   By: Jacqulynn Cadet M.D.   On: 05/12/2016 13:03    EKG: Independently reviewed. Vent. rate 125 BPM PR interval * ms QRS duration 103 ms QT/QTc 309/446 ms P-R-T axes 43 -36 118 Sinus tachycardia Ventricular premature complex LVH with secondary repolarization abnormality Anterior Q waves, possibly due to LVH  Assessment/Plan Principal Problem:   Sinus tachycardia (HCC) Admit to telemetry/observation. Continue daily metoprolol in the morning. Trend troponin levels. Check magnesium level. Check TSH. Check echocardiogram in a.m.  Active Problems:   HCAP (healthcare-associated pneumonia) Continue supplemental oxygen. Continue cefepime and vancomycin. Xopenex and Atrovent nebulizer treatments. Check Legionella and strep pneumoniae urinary antigens. Check a sputum Gram stain, culture and sensitivity.    MYASTHENIA GRAVIS W/O (ACUTE) EXACERBATION Stable. Continue Mestinon 60 mg by mouth 4 times a day. Continue CellCept 500 mg by mouth daily and 1000 mg by mouth nightly.    Essential hypertension Continue metoprolol XL 50 mg by mouth daily. Monitor blood pressure.    GERD Pantoprazole 40 mg by mouth daily    BPH (benign prostatic hyperplasia) Continue Flomax 0.4 mg by mouth daily    Abdominal pain Continue analgesics as needed.    Abdominal mass, LUQ (left upper quadrant) Follow-up biopsy report.     Normocytic anemia Stable Monitor H&H.    CAD (coronary artery disease)  Plavix was held due to biopsy. Continue metoprolol 50 mg by mouth daily. Continue Crestor 10 mg by mouth daily.    DVT prophylaxis: SCDs. Code Status: Full code. Family Communication: His sisters Vicente Males and [name] were present in the room. Disposition Plan: Admit for cardiac monitoring, troponin trending, echocardiogram, IV antibiotic therapy for several days. Consults called:  Admission status: Observation/telemetry.   Gerri Lins  Olevia Bowens MD [facility] Pager [phone].  If 7PM-7AM, please contact night-coverage [email or web address] Password [email or web address]  05/12/2016, 9:16 PM

## 2016-05-12 NOTE — ED Triage Notes (Signed)
He is brought over to our dep't. From [facility] Having just had a biopsy of an "intra abdominal lesion".  Staff from [facility] pt. Has tachycardia (he was in the 140-150 range after procedure); he arrives here drowsy and in no distress with a heart rate of ~125 and is a regular supraventricular tach. Family are with him also.  He is drowsy and is slow to arouse and answers all questions regarding his situation lucidly.  He has an indwelling foley cath. In place and a sm. Clean dressing at left lower rib area upon arrival to E.D.

## 2016-05-12 NOTE — Discharge Instructions (Signed)
Needle Biopsy, Care After °These instructions give you information about caring for yourself after your procedure. Your doctor may also give you more specific instructions. Call your doctor if you have any problems or questions after your procedure. °HOME CARE °· Rest as told by your doctor. °· Take medicines only as told by your doctor. °· There are many different ways to close and cover the biopsy site, including stitches (sutures), skin glue, and adhesive strips. Follow instructions from your doctor about: °¨ How to take care of your biopsy site. °¨ When and how you should change your bandage (dressing). °¨ When you should remove your dressing. °¨ Removing whatever was used to close your biopsy site. °· Check your biopsy site every day for signs of infection. Watch for: °¨ Redness, swelling, or pain. °¨ Fluid, blood, or pus. °GET HELP IF: °· You have a fever. °· You have redness, swelling, or pain at the biopsy site, and it lasts longer than a few days. °· You have fluid, blood, or pus coming from the biopsy site. °· You feel sick to your stomach (nauseous). °· You throw up (vomit). °GET HELP RIGHT AWAY IF: °· You are short of breath. °· You have trouble breathing. °· Your chest hurts. °· You feel dizzy or you pass out (faint). °· You have bleeding that does not stop with pressure or a bandage. °· You cough up blood. °· Your belly (abdomen) hurts. °  °This information is not intended to replace advice given to you by your health care provider. Make sure you discuss any questions you have with your health care provider. °  °Document Released: 09/02/2008 Document Revised: 02/04/2015 Document Reviewed: 09/16/2014 °Elsevier Interactive Patient Education ©2016 [employer] ° ° ° °Moderate Conscious Sedation, Adult °Sedation is the use of medicines to promote relaxation and relieve discomfort and anxiety. Moderate conscious sedation is a type of sedation. Under moderate conscious sedation you are less alert than  normal but are still able to respond to instructions or stimulation. Moderate conscious sedation is used during short medical and dental procedures. It is milder than deep sedation or general anesthesia and allows you to return to your regular activities sooner. °LET YOUR HEALTH CARE PROVIDER KNOW ABOUT:  °· Any allergies you have. °· All medicines you are taking, including vitamins, herbs, eye drops, creams, and over-the-counter medicines. °· Use of steroids (by mouth or creams). °· Previous problems you or members of your family have had with the use of anesthetics. °· Any blood disorders you have. °· Previous surgeries you have had. °· Medical conditions you have. °· Possibility of pregnancy, if this applies. °· Use of cigarettes, alcohol, or illegal drugs. °RISKS AND COMPLICATIONS °Generally, this is a safe procedure. However, as with any procedure, problems can occur. Possible problems include: °· Oversedation. °· Trouble breathing on your own. You may need to have a breathing tube until you are awake and breathing on your own. °· Allergic reaction to any of the medicines used for the procedure. °BEFORE THE PROCEDURE °· You may have blood tests done. These tests can help show how well your kidneys and liver are working. They can also show how well your blood clots. °· A physical exam will be done.   °· Only take medicines as directed by your health care provider. You may need to stop taking medicines (such as blood thinners, aspirin, or nonsteroidal anti-inflammatory drugs) before the procedure.   °· Do not eat or drink at least 6 hours before the procedure or   as directed by your health care provider. °· Arrange for a responsible adult, family member, or friend to take you home after the procedure. He or she should stay with you for at least 24 hours after the procedure, until the medicine has worn off. °PROCEDURE  °· An intravenous (IV) catheter will be inserted into one of your veins. Medicine will be able to  flow directly into your body through this catheter. You may be given medicine through this tube to help prevent pain and help you relax. °· The medical or dental procedure will be done. °AFTER THE PROCEDURE °· You will stay in a recovery area until the medicine has worn off. Your blood pressure and pulse will be checked.   °·  Depending on the procedure you had, you may be allowed to go home when you can tolerate liquids and your pain is under control. °  °This information is not intended to replace advice given to you by your health care provider. Make sure you discuss any questions you have with your health care provider. °  °Document Released: 06/15/2001 Document Revised: 10/11/2014 Document Reviewed: 05/28/2013 °Elsevier Interactive Patient Education ©2016 [employer] Inc. ° °

## 2016-05-12 NOTE — ED Provider Notes (Signed)
[place] DEPT Provider Note   CSN: [id number] Arrival date & time: 05/12/16  1423  First Provider Contact:  None       History   Chief Complaint Chief Complaint  Patient presents with  . Tachycardia    HPI [name] is a 82 y.o. male.  Patient is an 82 year old male with past medical history of myasthenia gravis, hypertension, coronary artery disease. He was brought to the emergency department from the outpatient procedure unit for evaluation of tachycardia. He was apparently they're having a biopsy performed of an abdominal lesion. Shortly after this was performed, he was found to be tachycardic with a heart rate of nearly 150. The patient denies to me he is experiencing any chest pain or shortness of breath. He denies any fevers or any chills. He denies any significant abdominal discomfort.   The history is provided by the patient.    Past Medical History:  Diagnosis Date  . Arthritis    back, hips, knees   . Benign prostatic hypertrophy   . Cancer [facility])    h/o renal carcinoma   . Chronic kidney disease    h/o UTI-2012  . Coronary artery disease    sees Dr. Shelva Majestic   . Diverticulitis   . GERD (gastroesophageal reflux disease)   . H/O epistaxis   . Heart disease   . Hypertension   . Myasthenia gravis [facility])    sees Dr. Jannifer Franklin  . Obesity    exogenous  . Osteoporosis   . Partial small bowel obstruction () 04/16/10   resolved with bowel rest  . Pulmonary embolus ([place])    2000  . Shortness of breath   . Urinary incontinence   . UTI (urinary tract infection)     Patient Active Problem List   Diagnosis Date Noted  . Abdominal pain 05/04/2016  . GI bleed 05/04/2016  . Abdominal mass, LUQ (left upper quadrant) 05/04/2016  . Normocytic anemia 05/04/2016  . CAD (coronary artery disease) 05/04/2016  . SIRS (systemic inflammatory response syndrome) ([place]) 05/04/2016  . Failure to thrive in adult 05/04/2016  . Urinary retention   . Pancreatic  lesion 04/20/2016  . Recurrent UTI 04/20/2016  . First degree heart block 10/08/2015  . Environmental allergies 09/03/2013  . Hyperlipidemia with target LDL less than 70 07/25/2013  . Hx of unilateral nephrectomy 07/25/2013  . 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 ABDOMINAL PAIN, ACUTE 04/16/2010  . ABDOMINAL PAIN, LOWER 04/16/2010  . COUGH 12/11/2009  . VIRAL GASTROENTERITIS 10/27/2009  . SKIN LESIONS, MULTIPLE 10/27/2009  . DEGENERATIVE JOINT DISEASE 10/27/2009  . RIB PAIN, RIGHT SIDED 08/13/2009  . TINEA CRURIS 06/11/2009  . UNSPECIFIED HEARING LOSS 06/11/2009  . LIPOMA 10/26/2007  . LEG CRAMPS 10/26/2007  . BENIGN PROSTATIC HYPERTROPHY 07/18/2007  . Personal history of renal cell carcinoma 03/27/2007  . MYASTHENIA GRAVIS W/O (ACUTE) EXACERBATION 03/27/2007  . Essential hypertension 03/27/2007  . Coronary atherosclerosis 03/27/2007  . GERD 03/27/2007  . COLONIC POLYPS, BENIGN, HX OF 03/27/2007  . DIVERTICULITIS, HX OF 03/27/2007    Past Surgical History:  Procedure Laterality Date  . ANGIOPLASTY     had 2 stent replacements  . CARDIAC CATHETERIZATION     2008, stents   . CARDIAC CATHETERIZATION  2008  . COLONOSCOPY  05/28/10   per Dr. Laurence Spates, diverticulosis, no repeats planned   . DOPPLER ECHOCARDIOGRAPHY    . EYE SURGERY     cataracts(bilateral)  removed, ?iol  . JOINT REPLACEMENT  2005-R, L knee replacement- 2000  . NEPHRECTOMY     left  . NM MYOVIEW LTD  02/2011   moderate inferior scar no ischemia  . SP KYPHOPLASTY     and vertebroplasty  . stented placed  1/08  . stress dipyridamole myocardial perfusion    . TONSILLECTOMY    . TOTAL KNEE ARTHROPLASTY     both Sees Dr. Novella Olive  . TOTAL KNEE REVISION  03/28/2012   Procedure: TOTAL KNEE REVISION;  Surgeon: Hessie Dibble, MD;  Location: [facility];  Service: Orthopedics;  Laterality: Right;       Home Medications    Prior to Admission medications   Medication Sig Start Date End Date  Taking? Authorizing Provider  amoxicillin-clavulanate (AUGMENTIN) 250-125 MG tablet Take 1 tablet by mouth 3 (three) times daily.    Historical Provider, MD  clopidogrel (PLAVIX) 75 MG tablet Take 75 mg by mouth daily.    Historical Provider, MD  metoprolol succinate (TOPROL-XL) 50 MG 24 hr tablet TAKE 1 TABLET ONCE DAILY. 10/24/15   Troy Sine, MD  Multiple Vitamin (MULTIVITAMIN WITH MINERALS) TABS Take 1 tablet by mouth daily.    Historical Provider, MD  mycophenolate (CELLCEPT) 500 MG tablet TAKE 1 TABLET EVERY MORNING AND 2 TABLETS AT BEDTIME. 04/16/16   Kathrynn Ducking, MD  phenazopyridine (PYRIDIUM) 200 MG tablet Take 1 tablet (200 mg total) by mouth 3 (three) times daily. 01/31/16   Melony Overly, MD  polyethylene glycol (MIRALAX / GLYCOLAX) packet Take 17 g by mouth daily. 05/06/16   Shanker Kristeen Mans, MD  pyridostigmine (MESTINON) 60 MG tablet TAKE  (1)  TABLET  FOUR TIMES DAILY. Patient taking differently: TAKE  (1)  TABLET TWO  TIMES DAILY. 03/09/16   Kathrynn Ducking, MD  rosuvastatin (CRESTOR) 10 MG tablet Take 1 tablet (10 mg total) by mouth daily. 10/07/15   Troy Sine, MD  tamsulosin (FLOMAX) 0.4 MG CAPS capsule Take 1 capsule (0.4 mg total) by mouth daily. 05/06/16   Shanker Kristeen Mans, MD    Family History Family History  Problem Relation Age of Onset  . Colon cancer Mother     Social History Social History  Substance Use Topics  . Smoking status: Former Smoker    Types: Cigarettes    Quit date: 03/22/1959  . Smokeless tobacco: Never Used     Comment: quit 1970s  . Alcohol use No     Allergies   Dicyclomine   Review of Systems Review of Systems  All other systems reviewed and are negative.    Physical Exam Updated Vital Signs BP 121/57 (BP Location: Left Arm)   Pulse (!) 125   Temp 97.8 F (36.6 C) (Oral)   Resp 22   SpO2 94%   Physical Exam  Constitutional: He is oriented to person, place, and time. He appears well-developed and well-nourished. No  distress.  HENT:  Head: Normocephalic and atraumatic.  Mouth/Throat: Oropharynx is clear and moist.  Neck: Normal range of motion. Neck supple.  Cardiovascular: Normal rate and regular rhythm.  Exam reveals no friction rub.   No murmur heard. Pulmonary/Chest: Effort normal and breath sounds normal. No respiratory distress. He has no wheezes. He has no rales.  Abdominal: Soft. Bowel sounds are normal. He exhibits no distension. There is no tenderness.  The biopsy site to the left upper quadrant has a dressing in place. There is no bleeding. The abdomen is not distended or tender.  Musculoskeletal: Normal range of  motion. He exhibits no edema.  Neurological: He is alert and oriented to person, place, and time. Coordination normal.  Skin: Skin is warm and dry. He is not diaphoretic.  Nursing note and vitals reviewed.    ED Treatments / Results  Labs (all labs ordered are listed, but only abnormal results are displayed) Labs Reviewed  BASIC METABOLIC PANEL  CBC WITH DIFFERENTIAL/PLATELET    EKG  EKG Interpretation None       Radiology Ct Biopsy  Result Date: 05/12/2016 INDICATION: 83 year old male with a prior history of renal cell carcinoma and a large left upper quadrant mass concerning for recurrent disease. He presents for biopsy. EXAM: CT BIOPSY MEDICATIONS: None. ANESTHESIA/SEDATION: Moderate (conscious) sedation was employed during this procedure. A total of Versed 1 mg and Fentanyl 50 mcg was administered intravenously. Moderate Sedation Time: 10 minutes. The patient's level of consciousness and vital signs were monitored continuously by radiology nursing throughout the procedure under my direct supervision. FLUOROSCOPY TIME:  None COMPLICATIONS: None immediate. PROCEDURE: Informed written consent was obtained from the patient after a thorough discussion of the procedural risks, benefits and alternatives. All questions were addressed. A timeout was performed prior to the  initiation of the procedure. A planning axial CT scan was performed. The large left upper quadrant abdominal mass was identified. A suitable skin entry site was selected and marked. The region was sterilely prepped and draped in standard fashion using chlorhexidine skin prep. Local anesthesia was attained by infiltration with 1% lidocaine. A small dermatotomy was made. Using intermittent CT guidance, a 19 gauge introducer needle was advanced into the margin of the mass. The introducer needle was positioned to biopsy needle rind of the lesion has centrally it is low attenuation and likely necrotic. Multiple 18 gauge core biopsies were then coaxially obtained using the bio Pince automated biopsy device. Biopsy specimens were placed in saline and delivered to pathology for further analysis. Following removal of the biopsy device and introducer needle, and axial CT scan was performed which demonstrates no hematoma or other complicating feature. The patient tolerated the procedure well. IMPRESSION: Technically successful CT-guided biopsy of left upper quadrant abdominal mass. Of note, the mass is centrally necrotic. Efforts were made to biopsy the peripheral rim of the lesion in hopes of obtaining viable tissue for diagnosis. Electronically Signed   By: Jacqulynn Cadet M.D.   On: 05/12/2016 13:03    Procedures Procedures (including critical care time)  Medications Ordered in ED Medications  sodium chloride 0.9 % bolus 1,000 mL (not administered)     Initial Impression / Assessment and Plan / ED Course  I have reviewed the triage vital signs and the nursing notes.  Pertinent labs & imaging results that were available during my care of the patient were reviewed by me and considered in my medical decision making (see chart for details).  Clinical Course     Final Clinical Impressions(s) / ED Diagnoses   Final diagnoses:  None   Patient brought to the emergency department from outpatient surgery  for evaluation of tachycardia that developed after a biopsy of an intra-abdominal lesion. CT scan reveals no evidence for bleeding or bowel perforation and laboratory studies are unremarkable. According to the sisters who are at bedside, he has become extremely weak compared to his baseline over the past several hours. Further workup was obtained with a head CT, urinalysis, and chest x-ray. These were unremarkable with the exception of a possible infiltrate in the left lower lobe.  Due to  the patient's apparent pneumonia and inability to ambulate, I do not feel as though I can safely discharge him. Have spoken with Dr. Olevia Bowens from the hospitalist service who agrees to admit.  New Prescriptions New Prescriptions   No medications on file     Veryl Speak, MD 05/12/16 2019

## 2016-05-12 NOTE — Progress Notes (Signed)
[place] in room reviewing EKG, heart rate continues  At 140-144.

## 2016-05-12 NOTE — ED Notes (Signed)
Pt was informed about urinalysis

## 2016-05-12 NOTE — ED Notes (Signed)
His family tell me that he did take his metoprolol this morning.  [facility] Staff also state pt. Has had a 7-day hiatus in taking his Plavix in anticipation of today's invasive procedure.

## 2016-05-12 NOTE — Progress Notes (Signed)
Pt. Taken to ER via stretcher, report given to Dillon Bjork RN

## 2016-05-12 NOTE — Progress Notes (Signed)
Pt. Shaking, pt. Denies any pain, Saverio Danker Pa  Notified, new orders received.

## 2016-05-12 NOTE — ED Notes (Signed)
Hospitalist at bedside 

## 2016-05-12 NOTE — Progress Notes (Signed)
Pharmacy Antibiotic Note  [name] is a 82 y.o. male with PMH myasthenia gravis, CAD who experienced tachycardia and weakness following biopsy of intra-abdominal mass today. Admitted 8/1-8/3 for urinary retention and sent home with Foley cath. Pharmacy has been consulted to dose vancomycin and cefepime for HCAP.  Plan:  Vancomycin 2000 mg IV now, then 750 mg IV q12 hr; goal trough 15-20 mcg/mL  Measure vancomycin trough levels at steady state as indicated  Cefepime 2g IV now, then 1 g IV q8 hr     Temp (24hrs), Avg:98.2 F (36.8 C), Min:97.6 F (36.4 C), Max:98.8 F (37.1 C)   Recent Labs Lab 05/12/16 0920 05/12/16 1500  WBC 11.9* 8.8  CREATININE  --  0.69    Estimated Creatinine Clearance: 77.9 mL/min (by C-G formula based on SCr of 0.8 mg/dL).    Allergies  Allergen Reactions  . Dicyclomine Other (See Comments)    REACTION: can worsen myasthenia gravis    Antimicrobials this admission: cefepime 8/9 >>  vancomycin 8/9 >>   PTA Augmentin for UTI Recent admission from 8/1 to 8/3  Dose adjustments this admission: ---  Microbiology results: 8/1 BCx: NGF 8/2 UCx: NGF   Thank you for allowing pharmacy to be a part of this patient's care.  Emanuell Morina A 05/12/2016 9:51 PM

## 2016-05-12 NOTE — H&P (Signed)
Chief Complaint: left upper abdominal mass  Referring Physician:Dr. Marianne Sofia, PCP: Alysia Penna  Supervising Physician: Jacqulynn Cadet  Patient Status: Out-pt  HPI: [name] is an 82 y.o. male who has a history of left nephrectomy for renal cell carcinoma in the 90s.  He was admitted to the hospital recently due to urinary retention.  He has a foley placed, which remains in place today.  He had a CT scan of his abdomen during this admission which revealed a large LUQ abdominal mass.  He has been scheduled for an outpatient biopsy and he presents today for this procedure.  He does take plavix, but this has been held since his discharge over 5 days ago.  Past Medical History:  Past Medical History:  Diagnosis Date  . Arthritis    back, hips, knees   . Benign prostatic hypertrophy   . Cancer [facility])    h/o renal carcinoma   . Chronic kidney disease    h/o UTI-2012  . Coronary artery disease    sees Dr. Shelva Majestic   . Diverticulitis   . GERD (gastroesophageal reflux disease)   . H/O epistaxis   . Heart disease   . Hypertension   . Myasthenia gravis [facility])    sees Dr. Jannifer Franklin  . Obesity    exogenous  . Osteoporosis   . Partial small bowel obstruction ([place]) 04/16/10   resolved with bowel rest  . Pulmonary embolus ([place])    2000  . Shortness of breath   . Urinary incontinence   . UTI (urinary tract infection)     Past Surgical History:  Past Surgical History:  Procedure Laterality Date  . ANGIOPLASTY     had 2 stent replacements  . CARDIAC CATHETERIZATION     2008, stents   . CARDIAC CATHETERIZATION  2008  . COLONOSCOPY  05/28/10   per Dr. Laurence Spates, diverticulosis, no repeats planned   . DOPPLER ECHOCARDIOGRAPHY    . EYE SURGERY     cataracts(bilateral)  removed, ?iol  . JOINT REPLACEMENT     2005-R, L knee replacement- 2000  . NEPHRECTOMY     left  . NM MYOVIEW LTD  02/2011   moderate inferior scar no ischemia  . SP KYPHOPLASTY     and  vertebroplasty  . stented placed  1/08  . stress dipyridamole myocardial perfusion    . TONSILLECTOMY    . TOTAL KNEE ARTHROPLASTY     both Sees Dr. Novella Olive  . TOTAL KNEE REVISION  03/28/2012   Procedure: TOTAL KNEE REVISION;  Surgeon: Hessie Dibble, MD;  Location: [place];  Service: Orthopedics;  Laterality: Right;    Family History:  Family History  Problem Relation Age of Onset  . Colon cancer Mother     Social History:  reports that he quit smoking about 57 years ago. His smoking use included Cigarettes. He has never used smokeless tobacco. He reports that he does not drink alcohol or use drugs.  Allergies:  Allergies  Allergen Reactions  . Dicyclomine Other (See Comments)    REACTION: can worsen myasthenia gravis    Medications: Medications reviewed in Epic  Please HPI for pertinent positives, otherwise complete 10 system ROS negative.  Mallampati Score: MD Evaluation Airway: WNL Heart: WNL Abdomen: WNL Chest/ Lungs: WNL ASA  Classification: 3 Mallampati/Airway Score: Two  Physical Exam: Ht 5\' 10"  (1.778 m)   Wt 234 lb 3.2 oz (106.2 kg)   BMI 33.60 kg/m  Body mass  index is 33.6 kg/m. General: pleasant, elderly white male who is laying in bed in NAD HEENT: head is normocephalic, atraumatic.  Sclera are noninjected.  PERRL.  Ears and nose without any masses or lesions.  Mouth is pink and moist Heart: regular, rate, and rhythm.  Normal s1,s2. No obvious gallops or rubs noted. + murmur  Palpable radial and pedal pulses bilaterally Lungs: CTAB, no wheezes, rhonchi, or rales noted.  Respiratory effort nonlabored Abd: soft, NT, ND, +BS, no masses, hernias, or organomegaly Psych: A&Ox3 with an appropriate affect.   Labs: Pending  Imaging: No results found.  Assessment/Plan 1. LUQ abdominal mass -we will plan to proceed with a biopsy of this mass today.  His plavix has been held for at least 5 days. -labs are pending, but vitals have been reviewed -Risks  and Benefits discussed with the patient including, but not limited to bleeding, infection, damage to adjacent structures or low yield requiring additional tests. All of the patient's questions were answered, patient is agreeable to proceed. Consent signed and in chart.  Thank you for this interesting consult.  I greatly enjoyed meeting [name] and look forward to participating in their care.  A copy of this report was sent to the requesting provider on this date.  Electronically Signed: Henreitta Cea 05/12/2016, 9:28 AM   I spent a total of    25 Minutes in face to face in clinical consultation, greater than 50% of which was counseling/coordinating care for LUQ abdominal mass

## 2016-05-13 ENCOUNTER — Observation Stay (HOSPITAL_COMMUNITY): Payer: Medicare Other

## 2016-05-13 DIAGNOSIS — I2510 Atherosclerotic heart disease of native coronary artery without angina pectoris: Secondary | ICD-10-CM

## 2016-05-13 DIAGNOSIS — R1902 Left upper quadrant abdominal swelling, mass and lump: Secondary | ICD-10-CM

## 2016-05-13 DIAGNOSIS — G7000 Myasthenia gravis without (acute) exacerbation: Secondary | ICD-10-CM

## 2016-05-13 DIAGNOSIS — N400 Enlarged prostate without lower urinary tract symptoms: Secondary | ICD-10-CM | POA: Diagnosis not present

## 2016-05-13 DIAGNOSIS — I81 Portal vein thrombosis: Secondary | ICD-10-CM

## 2016-05-13 DIAGNOSIS — D649 Anemia, unspecified: Secondary | ICD-10-CM

## 2016-05-13 DIAGNOSIS — I82890 Acute embolism and thrombosis of other specified veins: Secondary | ICD-10-CM | POA: Diagnosis present

## 2016-05-13 DIAGNOSIS — J189 Pneumonia, unspecified organism: Secondary | ICD-10-CM | POA: Diagnosis not present

## 2016-05-13 DIAGNOSIS — K219 Gastro-esophageal reflux disease without esophagitis: Secondary | ICD-10-CM

## 2016-05-13 DIAGNOSIS — I10 Essential (primary) hypertension: Secondary | ICD-10-CM

## 2016-05-13 DIAGNOSIS — D735 Infarction of spleen: Secondary | ICD-10-CM

## 2016-05-13 LAB — STREP PNEUMONIAE URINARY ANTIGEN: STREP PNEUMO URINARY ANTIGEN: NEGATIVE

## 2016-05-13 LAB — CBC
HCT: 32.4 % — ABNORMAL LOW (ref 39.0–52.0)
Hemoglobin: 11 g/dL — ABNORMAL LOW (ref 13.0–17.0)
MCH: 30.6 pg (ref 26.0–34.0)
MCHC: 34 g/dL (ref 30.0–36.0)
MCV: 90 fL (ref 78.0–100.0)
Platelets: 335 10*3/uL (ref 150–400)
RBC: 3.6 MIL/uL — ABNORMAL LOW (ref 4.22–5.81)
RDW: 15.6 % — AB (ref 11.5–15.5)
WBC: 14.7 10*3/uL — AB (ref 4.0–10.5)

## 2016-05-13 LAB — COMPREHENSIVE METABOLIC PANEL
ALBUMIN: 1.9 g/dL — AB (ref 3.5–5.0)
ALT: 26 U/L (ref 17–63)
ANION GAP: 6 (ref 5–15)
AST: 52 U/L — ABNORMAL HIGH (ref 15–41)
Alkaline Phosphatase: 168 U/L — ABNORMAL HIGH (ref 38–126)
BUN: 18 mg/dL (ref 6–20)
CHLORIDE: 107 mmol/L (ref 101–111)
CO2: 23 mmol/L (ref 22–32)
Calcium: 7.8 mg/dL — ABNORMAL LOW (ref 8.9–10.3)
Creatinine, Ser: 0.9 mg/dL (ref 0.61–1.24)
GFR calc Af Amer: >60 mL/min (ref >60)
GFR calc non Af Amer: >60 mL/min (ref >60)
GLUCOSE: 106 mg/dL — AB (ref 65–99)
POTASSIUM: 4.1 mmol/L (ref 3.5–5.1)
SODIUM: 136 mmol/L (ref 135–145)
Total Bilirubin: 1 mg/dL (ref 0.3–1.2)
Total Protein: 5.6 g/dL — ABNORMAL LOW (ref 6.5–8.1)

## 2016-05-13 LAB — ECHOCARDIOGRAM COMPLETE
HEIGHTINCHES: 70 in
Weight: 3756.64 oz

## 2016-05-13 LAB — TSH: TSH: 1.442 u[IU]/mL (ref 0.350–4.500)

## 2016-05-13 LAB — TROPONIN I: Troponin I: 0.06 ng/mL (ref <0.03)

## 2016-05-13 MED ORDER — PANTOPRAZOLE SODIUM 40 MG PO TBEC
40.0000 mg | DELAYED_RELEASE_TABLET | Freq: Every day | ORAL | Status: DC
  Administered 2016-05-13: 40 mg via ORAL
  Filled 2016-05-13: qty 1

## 2016-05-13 MED ORDER — CLOPIDOGREL BISULFATE 75 MG PO TABS
75.0000 mg | ORAL_TABLET | Freq: Every day | ORAL | Status: DC
  Administered 2016-05-13 – 2016-05-18 (×6): 75 mg via ORAL
  Filled 2016-05-13 (×6): qty 1

## 2016-05-13 MED ORDER — CETYLPYRIDINIUM CHLORIDE 0.05 % MT LIQD
7.0000 mL | Freq: Two times a day (BID) | OROMUCOSAL | Status: DC
  Administered 2016-05-13 – 2016-05-25 (×23): 7 mL via OROMUCOSAL

## 2016-05-13 MED ORDER — LORAZEPAM 1 MG PO TABS
1.0000 mg | ORAL_TABLET | Freq: Every day | ORAL | Status: DC
  Administered 2016-05-13: 1 mg via ORAL
  Filled 2016-05-13: qty 1

## 2016-05-13 MED ORDER — GUAIFENESIN-DM 100-10 MG/5ML PO SYRP
10.0000 mL | ORAL_SOLUTION | Freq: Three times a day (TID) | ORAL | Status: DC
  Administered 2016-05-13 – 2016-05-25 (×30): 10 mL via ORAL
  Filled 2016-05-13 (×31): qty 10

## 2016-05-13 MED ORDER — FAMOTIDINE 20 MG PO TABS
20.0000 mg | ORAL_TABLET | Freq: Every day | ORAL | Status: DC
  Administered 2016-05-14 – 2016-05-25 (×12): 20 mg via ORAL
  Filled 2016-05-13 (×12): qty 1

## 2016-05-13 NOTE — Progress Notes (Signed)
  Echocardiogram 2D Echocardiogram has been performed.  Joelene Millin 05/13/2016, 11:10 AM

## 2016-05-13 NOTE — Evaluation (Signed)
Physical Therapy Evaluation Patient Details Name: [name] MRN: [id number] DOB: 1925/09/07 Today's Date: 05/13/2016   History of Present Illness  82 y.o. male with medical history significant of osteoarthritis, BPH, renal carcinoma, chronic kidney disease, coronary artery disease, diverticulitis, GERD, hypertension, myasthenia gravis, obesity, osteoporosis, recently treated for UTI and admitted after pt developed sinus tachycardia in the 140s and 150s after getting a biopsy for LUQ mass with interventional radiology  Clinical Impression  Pt admitted with above diagnosis. Pt currently with functional limitations due to the deficits listed below (see PT Problem List).  Pt will benefit from skilled PT to increase their independence and safety with mobility to allow discharge to the venue listed below.  Pt unable to recall procedure performed prior to arrival as well as unable to state if he was living at home prior to arrival.  Pt seemed to recall previous admission a week ago and when questioned about functioning upon d/c pt was unable to recall.  No family present at time of evaluation.  Pt would benefit from ST-SNF if agreeable as pt does not appear at cognitive or physical functioning baseline.     Follow Up Recommendations SNF;Supervision/Assistance - 24 hour    Equipment Recommendations  Rolling walker with 5" wheels    Recommendations for Other Services       Precautions / Restrictions Precautions Precautions: Fall      Mobility  Bed Mobility Overal bed mobility: Modified Independent Bed Mobility: Supine to Sit;Sit to Supine     Supine to sit: Min assist Sit to supine: Min guard   General bed mobility comments: assist for trunk upright  Transfers Overall transfer level: Needs assistance Equipment used: None Transfers: Sit to/from Stand Sit to Stand: Min assist         General transfer comment: assist for rise and steady as well as controlling  descent  Ambulation/Gait Ambulation/Gait assistance: Min assist Ambulation Distance (Feet): 60 Feet Assistive device: None Gait Pattern/deviations: Step-through pattern;Trunk flexed;Decreased stride length     General Gait Details: pt declined RW however RW would likely have improved steadiness, pt pushed IV pole, distance to tolerance HR 78 bpm during session  Stairs            Wheelchair Mobility    Modified Rankin (Stroke Patients Only)       Balance                                             Pertinent Vitals/Pain      Home Living Family/patient expects to be discharged to:: Private residence Living Arrangements: Spouse/significant other Available Help at Discharge: Family Type of Home: House Home Access: Stairs to enter Entrance Stairs-Rails: Right;Left;Can reach both Entrance Stairs-Number of Steps: 8 Home Layout: Two level;Able to live on main level with bedroom/bathroom   Additional Comments: pt unable to states where he lived prior to admission, per chart review, pt admitted one week ago and info provided as above    Prior Function                 Hand Dominance        Extremity/Trunk Assessment   Upper Extremity Assessment: Generalized weakness           Lower Extremity Assessment: Overall WFL for tasks assessed         Communication  Cognition Arousal/Alertness: Awake/alert Behavior During Therapy: WFL for tasks assessed/performed Overall Cognitive Status: Within Functional Limits for tasks assessed                      General Comments      Exercises        Assessment/Plan    PT Assessment Patient needs continued PT services  PT Diagnosis Difficulty walking;Generalized weakness   PT Problem List Decreased strength;Decreased activity tolerance;Decreased cognition;Decreased mobility;Decreased safety awareness;Decreased knowledge of use of DME  PT Treatment Interventions DME  instruction;Gait training;Functional mobility training;Neuromuscular re-education;Patient/family education;Therapeutic activities;Therapeutic exercise   PT Goals (Current goals can be found in the Care Plan section) Acute Rehab PT Goals PT Goal Formulation: With patient Time For Goal Achievement: 05/27/16 Potential to Achieve Goals: Good    Frequency Min 3X/week   Barriers to discharge        Co-evaluation               End of Session Equipment Utilized During Treatment: Gait belt Activity Tolerance: Patient limited by fatigue Patient left: in bed;with call bell/phone within reach;with nursing/sitter in room Nurse Communication: Mobility status    Functional Assessment Tool Used: clinical judgement Functional Limitation: Mobility: Walking and moving around Mobility: Walking and Moving Around Current Status [id number]): At least 20 percent but less than 40 percent impaired, limited or restricted Mobility: Walking and Moving Around Goal Status [phone]): At least 1 percent but less than 20 percent impaired, limited or restricted    Time: 1352-1406 PT Time Calculation (min) (ACUTE ONLY): 14 min   Charges:   PT Evaluation $PT Eval Low Complexity: 1 Procedure     PT G Codes:   PT G-Codes **NOT FOR INPATIENT CLASS** Functional Assessment Tool Used: clinical judgement Functional Limitation: Mobility: Walking and moving around Mobility: Walking and Moving Around Current Status [id number]): At least 20 percent but less than 40 percent impaired, limited or restricted Mobility: Walking and Moving Around Goal Status [phone]): At least 1 percent but less than 20 percent impaired, limited or restricted    Ma Munoz,KATHrine E 05/13/2016, 3:25 PM Carmelia Bake, PT, DPT 05/13/2016 Pager: [phone]

## 2016-05-13 NOTE — Progress Notes (Signed)
Progress Note    [name]  [id number] DOB: 06-14-1925  DOA: 05/12/2016 PCP: [name], MD    Brief Narrative:   [name] is an 82 y.o. male with medical history significant of osteoarthritis, BPH, renal carcinoma, chronic kidney disease, coronary artery disease, diverticulitis, GERD, hypertension, myasthenia gravis, obesity, osteoporosis, recently treated for UTI, who was referred by interventional radiology after he was discovered to be tachycardic with heart rate in the 140s-150s after undergoing a biopsy to evaluate a left upper quadrant mass. In the ED, he was hydrated and given 50 mg of metoprolol with improvement of his heart rate into the 80s. He had mild elevated troponin at 0.11. Chest x-ray showed a left lower lobe infiltrate versus atelectasis.  Assessment/Plan:   Principal Problem:   Healthcare associated pneumonia complicated by sinus tachycardia ([place]) and demand ischemia The patient was admitted to telemetry with improved heart rates after receiving metoprolol. Troponins were elevated but trending down. This is most consistent with demand ischemia in the setting of tachycardia. TSH not elevated. Given his recent hospitalization, the patient was placed on cefepime and vancomycin. Follow-up blood cultures, sputum culture, Legionella pneumonia antigen. Strep pneumonia antigen was negative.  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 Benay Spice of oncology who does not recommend blood thinners at this time. Follow-up biopsy results.    MYASTHENIA GRAVIS W/O (ACUTE) EXACERBATION Continue Mestinon and CellCept.    Essential hypertension Continue metoprolol.    GERD Continue Protonix.    BPH (benign prostatic hyperplasia) Continue Flomax.    Abdominal pain/Abdominal mass, LUQ (left upper quadrant) Status post biopsy. Follow-up pathology report.   Normocytic anemia Stable. Monitor hemoglobin.    CAD (coronary artery disease)  Continue statin and beta blocker. Resume Plavix.   Family Communication/Anticipated D/C date and plan/Code Status   DVT prophylaxis: SCDs ordered. Code Status: Full Code.  Family Communication: No family currently at the bedside. Disposition Plan: Home when stable, likely several more days.   Medical Consultants:    Oncology   Procedures:    None  Anti-Infectives:   Cefepime/vancomycin 05/12/16-->  Subjective:    [name] reports that he is anxious and was unable to sleep last night. He is worried about his biopsy results. Has a cough but denies chest pain and shortness of breath. Nursing staff report that he was weaned off of his oxygen this morning.  Objective:    Vitals:   05/12/16 2131 05/12/16 2207 05/13/16 0409 05/13/16 0727  BP: 125/67  122/61   Pulse: 84  84 86  Resp: 20  18 18   Temp: 98.6 F (37 C)  97.9 F (36.6 C)   TempSrc: Axillary  Oral   SpO2: 94% 94% 94% 93%  Weight: 106.5 kg (234 lb 12.6 oz)     Height: 5\' 10"  (1.778 m)       Intake/Output Summary (Last 24 hours) at 05/13/16 0754 Last data filed at 05/13/16 0610  Gross per 24 hour  Intake           421.67 ml  Output              450 ml  Net           -28.33 ml   Filed Weights   05/12/16 2131  Weight: 106.5 kg (234 lb 12.6 oz)    Exam: General exam:  Appears calm and comfortable.  Respiratory system: Diminished breath sounds. Respiratory effort normal. Cardiovascular system: S1 & S2 heard, RRR. No JVD,  rubs, gallops or clicks. No murmurs. Gastrointestinal system: Abdomen is firm with a left upper quadrant mass.  Normal bowel sounds heard. Central nervous system: Alert, mildly confused. No focal neurological deficits. Extremities: No clubbing,  or cyanosis. No edema. Skin: No rashes, lesions or ulcers. Psychiatry: Judgement and insight appear normal. Mood & affect appropriate.   Data Reviewed:    I have personally reviewed following labs and imaging studies:  Labs: Basic Metabolic Panel:  Recent Labs Lab 05/12/16 1500 05/13/16 0330  NA 137 136  K 4.0 4.1  CL 105 107  CO2 22 23  GLUCOSE 97 106*  BUN 14 18  CREATININE 0.69 0.90  CALCIUM 8.1* 7.8*  MG 1.7  --    GFR Estimated Creatinine Clearance: 69.3 mL/min (by C-G formula based on SCr of 0.9 mg/dL). Liver Function Tests:  Recent Labs Lab 05/13/16 0330  AST 52*  ALT 26  ALKPHOS 168*  BILITOT 1.0  PROT 5.6*  ALBUMIN 1.9*   No results for input(s): LIPASE, AMYLASE in the last 168 hours. No results for input(s): AMMONIA in the last 168 hours. Coagulation profile  Recent Labs Lab 05/12/16 0920  INR 1.26    CBC:  Recent Labs Lab 05/12/16 0920 05/12/16 1500 05/13/16 0330  WBC 11.9* 8.8 14.7*  NEUTROABS  --  7.7  --   HGB 11.2* 11.0* 11.0*  HCT 33.6* 32.9* 32.4*  MCV 90.1 90.4 90.0  PLT 339 324 335   Cardiac Enzymes:  Recent Labs Lab 05/12/16 1500 05/12/16 2152 05/13/16 0330  TROPONINI 0.11* 0.10* 0.06*   Thyroid function studies:  Recent Labs  05/13/16 0330  TSH 1.442    Microbiology Recent Results (from the past 240 hour(s))  Urine culture     Status: None   Collection Time: 05/04/16  2:52 PM  Result Value Ref Range Status   Specimen Description URINE, CLEAN CATCH  Final   Special Requests NONE  Final   Culture NO GROWTH Performed at [facility]   Final   Report Status 05/05/2016 FINAL  Final  Culture, blood (routine x 2)     Status: None   Collection Time: 05/04/16 11:29 PM  Result Value Ref Range Status   Specimen Description BLOOD RIGHT ANTECUBITAL  Final   Special Requests BOTTLES DRAWN AEROBIC AND ANAEROBIC 5ML EA  Final   Culture   Final    NO GROWTH 5 DAYS Performed at [facility]    Report Status 05/10/2016 FINAL  Final  Culture, blood (routine x 2)     Status: None   Collection Time: 05/04/16 11:37 PM  Result Value Ref Range Status    Specimen Description BLOOD LEFT ANTECUBITAL  Final   Special Requests BOTTLES DRAWN AEROBIC AND ANAEROBIC 5ML  Final   Culture   Final    NO GROWTH 5 DAYS Performed at [facility]    Report Status 05/10/2016 FINAL  Final    Radiology: Dg Chest 2 View  Result Date: 05/12/2016 CLINICAL DATA:  82 year old male with weakness and tachycardia. EXAM: CHEST  2 VIEW COMPARISON:  05/04/2016 and prior radiographs FINDINGS: This is a low volume film. Increased left lower lung opacity may represent atelectasis or airspace disease/ pneumonia. Mild pulmonary vascular congestion is noted. Upper limits normal heart size noted. No pneumothorax or definite pleural effusion noted. No acute bony abnormalities are identified.  IMPRESSION: Low volume film with increased left lower lung opacity-question atelectasis versus airspace disease. Mild pulmonary vascular congestion. Electronically Signed   By: Margarette Canada M.D.   On: 05/12/2016 18:05   Ct Head Wo Contrast  Result Date: 05/12/2016 CLINICAL DATA:  Weakness. EXAM: CT HEAD WITHOUT CONTRAST TECHNIQUE: Contiguous axial images were obtained from the base of the skull through the vertex without intravenous contrast. COMPARISON:  11/22/2013 FINDINGS: Brain: No evidence of acute infarction, hemorrhage, hydrocephalus, or mass lesion/mass effect. Remote small bilateral cerebellar infarcts which appears stable from previous. Remote right posterior temporal and parietal cortically based infarct. Small remote bilateral occipital and right posterior frontal cortical infarcts. Small-vessel ischemic change in the cerebral white matter. Vascular: Atherosclerotic calcification. Opacified vessels from recent enhanced abdominal CT. Skull: Negative for fracture or focal lesion. Sinuses/Orbits: Left sphenoid sinusitis is chronic based on prior, with fluid level currently. Bilateral cataract resection Other: None. IMPRESSION: 1. No acute intracranial finding or change compared to 2015.  2. Numerous remote infarcts as described above. 3. Chronic left sphenoid sinusitis.  Fluid level is present today. Electronically Signed   By: Monte Fantasia M.D.   On: 05/12/2016 18:18   Ct Abdomen Pelvis W Contrast  Result Date: 05/12/2016 CLINICAL DATA:  had a biopsy of a left upper quadrant mass today. tachycardia (he was in the 140-150 range after procedure); he arrives here drowsy and in no distress with a heart rate of ~125 and is a regular supraventricular tach. Family are with him also. He is drowsy and is slow to arouse and answers all questions regarding his situation lucidly. He has an indwelling foley cath. In place and a sm. Clean dressing at left lower rib area EXAM: CT ABDOMEN AND PELVIS WITH CONTRAST TECHNIQUE: Multidetector CT imaging of the abdomen and pelvis was performed using the standard protocol following bolus administration of intravenous contrast. CONTRAST:  171mL ISOVUE-300 IOPAMIDOL (ISOVUE-300) INJECTION 61% COMPARISON:  Biopsy images from earlier the same day, and prior studies FINDINGS: Lower chest: Trace bilateral pleural effusions. Consolidation/ atelectasis posteriorly in the visualized lower lobes. Coronary calcifications. Hepatobiliary: Fatty liver without focal lesion. Partially calcified stones in the nondilated gallbladder. Pancreas: Mild atrophy in the mid body and head. There is masslike enlargement of pancreatic tail and a contiguous complex 17.8 cm mass with peripheral enhancement, central low-attenuation, stable compared to scans dating back to 05/04/2016. No evidence of hematoma or active extravasation. Spleen: The left upper quadrant mass abuts the splenic hilum. There are small wedge-shaped areas of decreased enhancement in the spleen suggesting direct involvement by the mass. Splenic vein thrombosis. Adrenals/Urinary Tract: Previous left nephrectomy. Right adrenal gland and kidney unremarkable. Foley catheter decompresses urinary bladder. Stomach/Bowel: Stomach is  decompressed, displaced medially by at the left upper quadrant mass without definite invasion. Small bowel and colon are nondilated. Innumerable sigmoid diverticula without adjacent inflammatory/edematous change. Vascular/Lymphatic: Moderate aortoiliac arterial calcifications without aneurysm or stenosis. Cavernous transformation of the portal vein. Apparent thrombosis of the splenic vein. SMV remains patent. Reproductive: No mass or other significant abnormality. Other: Scattered low-attenuation ascites in the pelvis and pericolic gutters left greater than right. No suggestion of hemoperitoneum. Musculoskeletal: Left lateral body wall hernia containing only fat. Multiple stable lower thoracic and lumbar compression deformities, post vertebral augmentation at T10 and L1. IMPRESSION: 1. Negative for hemorrhage or other acute complication of left upper quadrant mass biopsy. Critical Value/emergent results were called by telephone at the time of interpretation on 05/12/2016 at 5:03 pm to Dr. Laurence Ferrari, who verbally  acknowledged these results. 2. Stable left upper quadrant mass abutting the pancreas, probably invading the splenic hilum, with splenic and portal vein thrombosis. 3. Additional ancillary findings as above, stable since previous exam. Electronically Signed   By: Lucrezia Europe M.D.   On: 05/12/2016 17:04   Ct Biopsy  Result Date: 05/12/2016 INDICATION: 82 year old male with a prior history of renal cell carcinoma and a large left upper quadrant mass concerning for recurrent disease. He presents for biopsy. EXAM: CT BIOPSY MEDICATIONS: None. ANESTHESIA/SEDATION: Moderate (conscious) sedation was employed during this procedure. A total of Versed 1 mg and Fentanyl 50 mcg was administered intravenously. Moderate Sedation Time: 10 minutes. The patient's level of consciousness and vital signs were monitored continuously by radiology nursing throughout the procedure under my direct supervision. FLUOROSCOPY TIME:   None COMPLICATIONS: None immediate. PROCEDURE: Informed written consent was obtained from the patient after a thorough discussion of the procedural risks, benefits and alternatives. All questions were addressed. A timeout was performed prior to the initiation of the procedure. A planning axial CT scan was performed. The large left upper quadrant abdominal mass was identified. A suitable skin entry site was selected and marked. The region was sterilely prepped and draped in standard fashion using chlorhexidine skin prep. Local anesthesia was attained by infiltration with 1% lidocaine. A small dermatotomy was made. Using intermittent CT guidance, a 19 gauge introducer needle was advanced into the margin of the mass. The introducer needle was positioned to biopsy needle rind of the lesion has centrally it is low attenuation and likely necrotic. Multiple 18 gauge core biopsies were then coaxially obtained using the bio Pince automated biopsy device. Biopsy specimens were placed in saline and delivered to pathology for further analysis. Following removal of the biopsy device and introducer needle, and axial CT scan was performed which demonstrates no hematoma or other complicating feature. The patient tolerated the procedure well. IMPRESSION: Technically successful CT-guided biopsy of left upper quadrant abdominal mass. Of note, the mass is centrally necrotic. Efforts were made to biopsy the peripheral rim of the lesion in hopes of obtaining viable tissue for diagnosis. Electronically Signed   By: Jacqulynn Cadet M.D.   On: 05/12/2016 13:03    Medications:   . antiseptic oral rinse  7 mL Mouth Rinse BID  . ceFEPime (MAXIPIME) IV  1 g Intravenous Q8H  . metoprolol succinate  50 mg Oral Daily  . multivitamin with minerals  1 tablet Oral Daily  . mycophenolate  1,000 mg Oral QHS  . mycophenolate  500 mg Oral Daily  . pantoprazole  40 mg Oral Daily  . pyridostigmine  60 mg Oral QID  . sodium chloride flush  3  mL Intravenous Q12H  . tamsulosin  0.4 mg Oral Daily  . vancomycin  750 mg Intravenous Q12H   Continuous Infusions: . 0.9 % NaCl with KCl 20 mEq / L 50 mL/hr at 05/12/16 2234    Time spent: 35 minutes.  The patient is medically complex with multiple co-morbidities and is at high risk for clinical deterioration and requires high complexity decision making.    LOS: 0 days   RAMA,CHRISTINA  [facility] Pager [phone]. If unable to reach me by pager, please call my cell phone at [phone].  *Please refer to [email or web address], password [email or web address] to get updated schedule on who will round on this patient, as hospitalists switch teams weekly. If 7PM-7AM, please contact night-coverage at [email or web address], password [email or web address] for any overnight needs.  05/13/2016, 7:54 AM

## 2016-05-14 DIAGNOSIS — I81 Portal vein thrombosis: Secondary | ICD-10-CM | POA: Diagnosis present

## 2016-05-14 DIAGNOSIS — I2510 Atherosclerotic heart disease of native coronary artery without angina pectoris: Secondary | ICD-10-CM | POA: Diagnosis not present

## 2016-05-14 DIAGNOSIS — Z881 Allergy status to other antibiotic agents status: Secondary | ICD-10-CM | POA: Diagnosis not present

## 2016-05-14 DIAGNOSIS — I63432 Cerebral infarction due to embolism of left posterior cerebral artery: Secondary | ICD-10-CM | POA: Diagnosis present

## 2016-05-14 DIAGNOSIS — Z905 Acquired absence of kidney: Secondary | ICD-10-CM | POA: Diagnosis not present

## 2016-05-14 DIAGNOSIS — I82441 Acute embolism and thrombosis of right tibial vein: Secondary | ICD-10-CM | POA: Diagnosis not present

## 2016-05-14 DIAGNOSIS — K219 Gastro-esophageal reflux disease without esophagitis: Secondary | ICD-10-CM | POA: Diagnosis not present

## 2016-05-14 DIAGNOSIS — C801 Malignant (primary) neoplasm, unspecified: Secondary | ICD-10-CM | POA: Diagnosis not present

## 2016-05-14 DIAGNOSIS — R339 Retention of urine, unspecified: Secondary | ICD-10-CM | POA: Diagnosis not present

## 2016-05-14 DIAGNOSIS — Z466 Encounter for fitting and adjustment of urinary device: Secondary | ICD-10-CM | POA: Diagnosis not present

## 2016-05-14 DIAGNOSIS — Z7189 Other specified counseling: Secondary | ICD-10-CM | POA: Diagnosis not present

## 2016-05-14 DIAGNOSIS — I824Y1 Acute embolism and thrombosis of unspecified deep veins of right proximal lower extremity: Secondary | ICD-10-CM | POA: Diagnosis not present

## 2016-05-14 DIAGNOSIS — J189 Pneumonia, unspecified organism: Secondary | ICD-10-CM | POA: Diagnosis present

## 2016-05-14 DIAGNOSIS — I10 Essential (primary) hypertension: Secondary | ICD-10-CM | POA: Diagnosis not present

## 2016-05-14 DIAGNOSIS — G7000 Myasthenia gravis without (acute) exacerbation: Secondary | ICD-10-CM | POA: Diagnosis present

## 2016-05-14 DIAGNOSIS — Z515 Encounter for palliative care: Secondary | ICD-10-CM | POA: Diagnosis not present

## 2016-05-14 DIAGNOSIS — Y95 Nosocomial condition: Secondary | ICD-10-CM | POA: Diagnosis present

## 2016-05-14 DIAGNOSIS — Z9841 Cataract extraction status, right eye: Secondary | ICD-10-CM | POA: Diagnosis not present

## 2016-05-14 DIAGNOSIS — C642 Malignant neoplasm of left kidney, except renal pelvis: Secondary | ICD-10-CM | POA: Diagnosis present

## 2016-05-14 DIAGNOSIS — Z955 Presence of coronary angioplasty implant and graft: Secondary | ICD-10-CM | POA: Diagnosis not present

## 2016-05-14 DIAGNOSIS — I248 Other forms of acute ischemic heart disease: Secondary | ICD-10-CM | POA: Diagnosis present

## 2016-05-14 DIAGNOSIS — R1902 Left upper quadrant abdominal swelling, mass and lump: Secondary | ICD-10-CM | POA: Diagnosis present

## 2016-05-14 DIAGNOSIS — Z9842 Cataract extraction status, left eye: Secondary | ICD-10-CM | POA: Diagnosis not present

## 2016-05-14 DIAGNOSIS — D6859 Other primary thrombophilia: Secondary | ICD-10-CM | POA: Diagnosis present

## 2016-05-14 DIAGNOSIS — Z96653 Presence of artificial knee joint, bilateral: Secondary | ICD-10-CM | POA: Diagnosis present

## 2016-05-14 DIAGNOSIS — I639 Cerebral infarction, unspecified: Secondary | ICD-10-CM | POA: Diagnosis not present

## 2016-05-14 DIAGNOSIS — C649 Malignant neoplasm of unspecified kidney, except renal pelvis: Secondary | ICD-10-CM | POA: Diagnosis not present

## 2016-05-14 DIAGNOSIS — I82401 Acute embolism and thrombosis of unspecified deep veins of right lower extremity: Secondary | ICD-10-CM | POA: Diagnosis present

## 2016-05-14 DIAGNOSIS — N189 Chronic kidney disease, unspecified: Secondary | ICD-10-CM | POA: Diagnosis present

## 2016-05-14 DIAGNOSIS — Z85528 Personal history of other malignant neoplasm of kidney: Secondary | ICD-10-CM | POA: Diagnosis not present

## 2016-05-14 DIAGNOSIS — N400 Enlarged prostate without lower urinary tract symptoms: Secondary | ICD-10-CM | POA: Diagnosis not present

## 2016-05-14 DIAGNOSIS — Z66 Do not resuscitate: Secondary | ICD-10-CM | POA: Diagnosis not present

## 2016-05-14 DIAGNOSIS — G9340 Encephalopathy, unspecified: Secondary | ICD-10-CM | POA: Diagnosis present

## 2016-05-14 DIAGNOSIS — I6340 Cerebral infarction due to embolism of unspecified cerebral artery: Secondary | ICD-10-CM | POA: Diagnosis not present

## 2016-05-14 DIAGNOSIS — C7889 Secondary malignant neoplasm of other digestive organs: Secondary | ICD-10-CM | POA: Diagnosis present

## 2016-05-14 DIAGNOSIS — I129 Hypertensive chronic kidney disease with stage 1 through stage 4 chronic kidney disease, or unspecified chronic kidney disease: Secondary | ICD-10-CM | POA: Diagnosis present

## 2016-05-14 DIAGNOSIS — I471 Supraventricular tachycardia: Secondary | ICD-10-CM | POA: Diagnosis present

## 2016-05-14 DIAGNOSIS — Z7902 Long term (current) use of antithrombotics/antiplatelets: Secondary | ICD-10-CM | POA: Diagnosis not present

## 2016-05-14 DIAGNOSIS — Z87891 Personal history of nicotine dependence: Secondary | ICD-10-CM | POA: Diagnosis not present

## 2016-05-14 DIAGNOSIS — M7989 Other specified soft tissue disorders: Secondary | ICD-10-CM | POA: Diagnosis not present

## 2016-05-14 DIAGNOSIS — Z79899 Other long term (current) drug therapy: Secondary | ICD-10-CM | POA: Diagnosis not present

## 2016-05-14 LAB — LEGIONELLA PNEUMOPHILA SEROGP 1 UR AG: L. pneumophila Serogp 1 Ur Ag: NEGATIVE

## 2016-05-14 LAB — BASIC METABOLIC PANEL
ANION GAP: 4 — AB (ref 5–15)
BUN: 22 mg/dL — ABNORMAL HIGH (ref 6–20)
CHLORIDE: 107 mmol/L (ref 101–111)
CO2: 24 mmol/L (ref 22–32)
Calcium: 7.8 mg/dL — ABNORMAL LOW (ref 8.9–10.3)
Creatinine, Ser: 0.79 mg/dL (ref 0.61–1.24)
GFR calc Af Amer: >60 mL/min (ref >60)
GFR calc non Af Amer: >60 mL/min (ref >60)
GLUCOSE: 99 mg/dL (ref 65–99)
POTASSIUM: 4.1 mmol/L (ref 3.5–5.1)
Sodium: 135 mmol/L (ref 135–145)

## 2016-05-14 LAB — CBC
HEMATOCRIT: 28.8 % — AB (ref 39.0–52.0)
Hemoglobin: 9.9 g/dL — ABNORMAL LOW (ref 13.0–17.0)
MCH: 30.6 pg (ref 26.0–34.0)
MCHC: 34.4 g/dL (ref 30.0–36.0)
MCV: 88.9 fL (ref 78.0–100.0)
Platelets: 300 10*3/uL (ref 150–400)
RBC: 3.24 MIL/uL — ABNORMAL LOW (ref 4.22–5.81)
RDW: 15.9 % — AB (ref 11.5–15.5)
WBC: 9 10*3/uL (ref 4.0–10.5)

## 2016-05-14 MED ORDER — RESOURCE THICKENUP CLEAR PO POWD
ORAL | Status: DC | PRN
  Filled 2016-05-14 (×2): qty 125

## 2016-05-14 NOTE — Evaluation (Signed)
Clinical/Bedside Swallow Evaluation Patient Details  Name: [name] MRN: [id number] Date of Birth: August 03, 1925  Today's Date: 05/14/2016 Time: SLP Start Time (ACUTE ONLY): 1214 SLP Stop Time (ACUTE ONLY): 1235 SLP Time Calculation (min) (ACUTE ONLY): 21 min  Past Medical History:  Past Medical History:  Diagnosis Date  . Arthritis    back, hips, knees   . Benign prostatic hypertrophy   . Cancer [facility])    h/o renal carcinoma   . Chronic kidney disease    h/o UTI-2012  . Coronary artery disease    sees Dr. Shelva Majestic   . Diverticulitis   . GERD (gastroesophageal reflux disease)   . H/O epistaxis   . Heart disease   . Hypertension   . Myasthenia gravis [facility])    sees Dr. Jannifer Franklin  . Obesity    exogenous  . Osteoporosis   . Partial small bowel obstruction ([place]) 04/16/10   resolved with bowel rest  . Pulmonary embolus ([place])    2000  . Shortness of breath   . Urinary incontinence   . UTI (urinary tract infection)    Past Surgical History:  Past Surgical History:  Procedure Laterality Date  . ANGIOPLASTY     had 2 stent replacements  . CARDIAC CATHETERIZATION     2008, stents   . CARDIAC CATHETERIZATION  2008  . COLONOSCOPY  05/28/10   per Dr. Laurence Spates, diverticulosis, no repeats planned   . DOPPLER ECHOCARDIOGRAPHY    . EYE SURGERY     cataracts(bilateral)  removed, ?iol  . JOINT REPLACEMENT     2005-R, L knee replacement- 2000  . NEPHRECTOMY     left  . NM MYOVIEW LTD  02/2011   moderate inferior scar no ischemia  . SP KYPHOPLASTY     and vertebroplasty  . stented placed  1/08  . stress dipyridamole myocardial perfusion    . TONSILLECTOMY    . TOTAL KNEE ARTHROPLASTY     both Sees Dr. Novella Olive  . TOTAL KNEE REVISION  03/28/2012   Procedure: TOTAL KNEE REVISION;  Surgeon: Hessie Dibble, MD;  Location: [facility];  Service: Orthopedics;  Laterality: Right;   HPI:  82 yo male adm to [facility] with weakness - recent biopsy for LUQ mass - pt was diagnosed with  HCAP.  PMH + for GERD, renal cancer, Myasthenia Gravis on Mestinon.  CT head negative.  CXR left lower lobe opacity.  Swallow evaluation ordered.      Assessment / Plan / Recommendation Clinical Impression  Pt was lethargic and severely dysarthric - with open mouth posture.  He was willing to accept minimal intake given by SLP including canned fruit, Ensure and water.  Immediate throat clearing noted post-swallow of thin - and pt did NOT conduct cough or dry swallow on command.  RN reports pt coughing with thin liquids today and pt admits to occasinal issues with dyspahgia.  Given current lethargy and myasthenia gravis, SLP recommends modify diet to mitigate risk to dys3/thin, allowing ice chips.      Aspiration Risk  Moderate aspiration risk    Diet Recommendation Dysphagia 3 (Mech soft);Nectar-thick liquid   Liquid Administration via: Cup Medication Administration: Whole meds with puree Supervision: Patient able to self feed;Full supervision/cueing for compensatory strategies Compensations: Slow rate;Small sips/bites    Other  Recommendations Oral Care Recommendations: Oral care BID   Follow up Recommendations       Frequency and Duration min 2x/week  1 week  Prognosis Prognosis for Safe Diet Advancement: Fair      Swallow Study   General Date of Onset: 05/14/16 HPI: 82 yo male adm to [facility] with weakness - recent biopsy for LUQ mass - pt was diagnosed with HCAP.  PMH + for GERD, renal cancer, Myasthenia Gravis on Mestinon.  CT head negative.  CXR left lower lobe opacity.  Swallow evaluation ordered.    Type of Study: Bedside Swallow Evaluation Diet Prior to this Study: Thin liquids;Regular Temperature Spikes Noted: No Respiratory Status: Nasal cannula History of Recent Intubation: No Behavior/Cognition: Alert;Cooperative;Pleasant mood Oral Cavity Assessment: Within Functional Limits Oral Care Completed by SLP: No Oral Cavity - Dentition: Adequate natural  dentition Vision: Functional for self-feeding Self-Feeding Abilities: Able to feed self Patient Positioning: Upright in bed Baseline Vocal Quality: Low vocal intensity Volitional Cough: Cognitively unable to elicit (pt did not conduct cough upon cue) Volitional Swallow: Able to elicit (with delay)    Oral/Motor/Sensory Function Overall Oral Motor/Sensory Function: Generalized oral weakness (gross weakness; no focal cn deficits, pt lethargic)   Ice Chips Ice chips: Not tested   Thin Liquid Thin Liquid: Impaired Presentation: Spoon Oral Phase Impairments: Reduced labial seal;Reduced lingual movement/coordination Oral Phase Functional Implications: Prolonged oral transit Pharyngeal  Phase Impairments: Throat Clearing - Immediate    Nectar Thick Nectar Thick Liquid: Impaired Presentation: Straw;Spoon Oral Phase Impairments: Reduced lingual movement/coordination;Reduced labial seal Oral phase functional implications: Prolonged oral transit Pharyngeal Phase Impairments: Throat Clearing - Delayed   Honey Thick Honey Thick Liquid: Not tested   Puree Puree: Not tested   Solid   GO   Solid: Impaired Oral Phase Impairments: Reduced labial seal;Reduced lingual movement/coordination;Impaired mastication Oral Phase Functional Implications: Prolonged oral transit    Functional Limitations: Swallowing Swallow Current Status [id number]): At least 60 percent but less than 80 percent impaired, limited or restricted Swallow Goal Status [phone]): At least 40 percent but less than 60 percent impaired, limited or restricted   Luanna Salk, [facility] SLP [phone]

## 2016-05-14 NOTE — Progress Notes (Signed)
PT Cancellation Note  Patient Details Name: [name] MRN: [id number] DOB: Sep 23, 1925   Cancelled Treatment:     RN reports "this is not a good time".  Increased SOB and lethargy today.  Will check back later as schedule permites.    Nathanial Rancher 05/14/2016, 2:09 PM

## 2016-05-14 NOTE — Progress Notes (Signed)
Progress Note    [name]  [id number] DOB: [birth year]  DOA: 05/12/2016 PCP: [name], MD    Brief Narrative:   [name] is an 82 y.o. male with medical history significant of osteoarthritis, BPH, renal carcinoma, chronic kidney disease, coronary artery disease, diverticulitis, GERD, hypertension, myasthenia gravis, obesity, osteoporosis, recently treated for UTI, who was referred by interventional radiology after he was discovered to be tachycardic with heart rate in the 140s-150s after undergoing a biopsy to evaluate a left upper quadrant mass. In the ED, he was hydrated and given 50 mg of metoprolol with improvement of his heart rate into the 80s. He had mild elevated troponin at 0.11. Chest x-ray showed a left lower lobe infiltrate versus atelectasis.  Assessment/Plan:   Principal Problem:   Healthcare associated pneumonia complicated by sinus tachycardia ([place]) and demand ischemia The patient was admitted to telemetry with improved heart rates after receiving metoprolol. Troponins were elevated but trending down. This is most consistent with demand ischemia in the setting of tachycardia. TSH not elevated. Given his recent hospitalization, the patient was placed on cefepime and vancomycin. Follow-up blood cultures, sputum culture, Legionella pneumonia antigen. Strep pneumonia antigen was negative. With myasthenia gravis -- need to avoid floroquinolones, clinda, aminoglycoside, macrolide, tetra, sulfa, ampicillin, primaxin. When patient is ready to be transitioned to PO abx, will use cefpodoxime or cefuroxime.  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 Benay Spice of oncology who does not recommend blood thinners at this time. Follow-up biopsy results.    MYASTHENIA GRAVIS W/O (ACUTE) EXACERBATION Continue Mestinon and CellCept. Needs to F/U with Dr. Jannifer Franklin.    Essential hypertension Continue metoprolol.    GERD Continue Protonix.    BPH (benign prostatic hyperplasia) Continue Flomax.    Abdominal pain/Abdominal mass, LUQ (left upper quadrant) Status post biopsy. Follow-up pathology report.    Normocytic anemia Stable. Monitor hemoglobin.    CAD (coronary artery disease)  Continue statin and beta blocker. Resume Plavix.   Family Communication/Anticipated D/C date and plan/Code Status   DVT prophylaxis: SCDs ordered. Code Status: Full Code.  Family Communication: Wife & sister updated by telephone 05/14/16. Disposition Plan: Likely will need SNF, will get PT evaluation.   Medical Consultants:    Telephone consult with Dr. Benay Spice   Procedures:    None  Anti-Infectives:   Cefepime/vancomycin 05/12/16-->  Subjective:   [name] is weak, still very difficult to understand. Slept better, but seems a bit confused.  Having some loose stools. No reports of pain.  Objective:    Vitals:   05/13/16 1000 05/13/16 1249 05/13/16 2051 05/14/16 0447  BP:  (!) 129/52 (!) 108/58 125/67  Pulse:  84 70 77  Resp:  19 20 18   Temp:  98.2 F (36.8 C) 97.9 F (36.6 C) 98 F (36.7 C)  TempSrc:  Oral Tympanic Oral  SpO2: 94% 94% 93% 94%  Weight:      Height:        Intake/Output Summary (Last 24 hours) at 05/14/16 0745 Last data filed at 05/14/16 0645  Gross per 24 hour  Intake             1380 ml  Output              650 ml  Net  730 ml   Filed Weights   05/12/16 2131  Weight: 106.5 kg (234 lb 12.6 oz)    Exam: General exam: Appears calm and comfortable.  Respiratory system: Diminished breath sounds. Respiratory effort normal. Cardiovascular system: S1 & S2 heard, RRR. No JVD,  rubs, gallops or clicks. No murmurs. Gastrointestinal system: Abdomen is firm with a left upper quadrant mass.  Normal bowel sounds heard. Central nervous system: Alert, mildly confused. Generalized weakness. Speech difficult to  understand. No focal neurological deficits. Extremities: No clubbing,  or cyanosis. No edema. Skin: No rashes, lesions or ulcers. Psychiatry: Judgement and insight appear normal. Mood & affect depressed.   Data Reviewed:   I have personally reviewed following labs and imaging studies:  Labs: Basic Metabolic Panel:  Recent Labs Lab 05/12/16 1500 05/13/16 0330 05/14/16 0525  NA 137 136 135  K 4.0 4.1 4.1  CL 105 107 107  CO2 22 23 24   GLUCOSE 97 106* 99  BUN 14 18 22*  CREATININE 0.69 0.90 0.79  CALCIUM 8.1* 7.8* 7.8*  MG 1.7  --   --    GFR Estimated Creatinine Clearance: 78 mL/min (by C-G formula based on SCr of 0.8 mg/dL). Liver Function Tests:  Recent Labs Lab 05/13/16 0330  AST 52*  ALT 26  ALKPHOS 168*  BILITOT 1.0  PROT 5.6*  ALBUMIN 1.9*   Coagulation profile  Recent Labs Lab 05/12/16 0920  INR 1.26    CBC:  Recent Labs Lab 05/12/16 0920 05/12/16 1500 05/13/16 0330 05/14/16 0525  WBC 11.9* 8.8 14.7* 9.0  NEUTROABS  --  7.7  --   --   HGB 11.2* 11.0* 11.0* 9.9*  HCT 33.6* 32.9* 32.4* 28.8*  MCV 90.1 90.4 90.0 88.9  PLT 339 324 335 300   Cardiac Enzymes:  Recent Labs Lab 05/12/16 1500 05/12/16 2152 05/13/16 0330  TROPONINI 0.11* 0.10* 0.06*   Thyroid function studies:  Recent Labs  05/13/16 0330  TSH 1.442    Microbiology Recent Results (from the past 240 hour(s))  Urine culture     Status: None   Collection Time: 05/04/16  2:52 PM  Result Value Ref Range Status   Specimen Description URINE, CLEAN CATCH  Final   Special Requests NONE  Final   Culture NO GROWTH Performed at [facility]   Final   Report Status 05/05/2016 FINAL  Final  Culture, blood (routine x 2)     Status: None   Collection Time: 05/04/16 11:29 PM  Result Value Ref Range Status   Specimen Description BLOOD RIGHT ANTECUBITAL  Final   Special Requests BOTTLES DRAWN AEROBIC AND ANAEROBIC 5ML EA  Final   Culture   Final    NO GROWTH 5  DAYS Performed at [facility]    Report Status 05/10/2016 FINAL  Final  Culture, blood (routine x 2)     Status: None   Collection Time: 05/04/16 11:37 PM  Result Value Ref Range Status   Specimen Description BLOOD LEFT ANTECUBITAL  Final   Special Requests BOTTLES DRAWN AEROBIC AND ANAEROBIC 5ML  Final   Culture   Final    NO GROWTH 5 DAYS Performed at [facility]    Report Status 05/10/2016 FINAL  Final    Radiology: Dg Chest 2 View  Result Date: 05/12/2016 CLINICAL DATA:  82 year old male with weakness and tachycardia. EXAM: CHEST  2 VIEW COMPARISON:  05/04/2016 and prior radiographs FINDINGS: This is a low volume film. Increased left lower lung opacity  may represent atelectasis or airspace disease/ pneumonia. Mild pulmonary vascular congestion is noted. Upper limits normal heart size noted. No pneumothorax or definite pleural effusion noted. No acute bony abnormalities are identified. IMPRESSION: Low volume film with increased left lower lung opacity-question atelectasis versus airspace disease. Mild pulmonary vascular congestion. Electronically Signed   By: Margarette Canada M.D.   On: 05/12/2016 18:05   Ct Head Wo Contrast  Result Date: 05/12/2016 CLINICAL DATA:  Weakness. EXAM: CT HEAD WITHOUT CONTRAST TECHNIQUE: Contiguous axial images were obtained from the base of the skull through the vertex without intravenous contrast. COMPARISON:  11/22/2013 FINDINGS: Brain: No evidence of acute infarction, hemorrhage, hydrocephalus, or mass lesion/mass effect. Remote small bilateral cerebellar infarcts which appears stable from previous. Remote right posterior temporal and parietal cortically based infarct. Small remote bilateral occipital and right posterior frontal cortical infarcts. Small-vessel ischemic change in the cerebral white matter. Vascular: Atherosclerotic calcification. Opacified vessels from recent enhanced abdominal CT. Skull: Negative for fracture or focal lesion.  Sinuses/Orbits: Left sphenoid sinusitis is chronic based on prior, with fluid level currently. Bilateral cataract resection Other: None. IMPRESSION: 1. No acute intracranial finding or change compared to 2015. 2. Numerous remote infarcts as described above. 3. Chronic left sphenoid sinusitis.  Fluid level is present today. Electronically Signed   By: Monte Fantasia M.D.   On: 05/12/2016 18:18   Ct Abdomen Pelvis W Contrast  Result Date: 05/12/2016 CLINICAL DATA:  had a biopsy of a left upper quadrant mass today. tachycardia (he was in the 140-150 range after procedure); he arrives here drowsy and in no distress with a heart rate of ~125 and is a regular supraventricular tach. Family are with him also. He is drowsy and is slow to arouse and answers all questions regarding his situation lucidly. He has an indwelling foley cath. In place and a sm. Clean dressing at left lower rib area EXAM: CT ABDOMEN AND PELVIS WITH CONTRAST TECHNIQUE: Multidetector CT imaging of the abdomen and pelvis was performed using the standard protocol following bolus administration of intravenous contrast. CONTRAST:  151mL ISOVUE-300 IOPAMIDOL (ISOVUE-300) INJECTION 61% COMPARISON:  Biopsy images from earlier the same day, and prior studies FINDINGS: Lower chest: Trace bilateral pleural effusions. Consolidation/ atelectasis posteriorly in the visualized lower lobes. Coronary calcifications. Hepatobiliary: Fatty liver without focal lesion. Partially calcified stones in the nondilated gallbladder. Pancreas: Mild atrophy in the mid body and head. There is masslike enlargement of pancreatic tail and a contiguous complex 17.8 cm mass with peripheral enhancement, central low-attenuation, stable compared to scans dating back to 05/04/2016. No evidence of hematoma or active extravasation. Spleen: The left upper quadrant mass abuts the splenic hilum. There are small wedge-shaped areas of decreased enhancement in the spleen suggesting direct  involvement by the mass. Splenic vein thrombosis. Adrenals/Urinary Tract: Previous left nephrectomy. Right adrenal gland and kidney unremarkable. Foley catheter decompresses urinary bladder. Stomach/Bowel: Stomach is decompressed, displaced medially by at the left upper quadrant mass without definite invasion. Small bowel and colon are nondilated. Innumerable sigmoid diverticula without adjacent inflammatory/edematous change. Vascular/Lymphatic: Moderate aortoiliac arterial calcifications without aneurysm or stenosis. Cavernous transformation of the portal vein. Apparent thrombosis of the splenic vein. SMV remains patent. Reproductive: No mass or other significant abnormality. Other: Scattered low-attenuation ascites in the pelvis and pericolic gutters left greater than right. No suggestion of hemoperitoneum. Musculoskeletal: Left lateral body wall hernia containing only fat. Multiple stable lower thoracic and lumbar compression deformities, post vertebral augmentation at T10 and L1. IMPRESSION: 1. Negative for hemorrhage  or other acute complication of left upper quadrant mass biopsy. Critical Value/emergent results were called by telephone at the time of interpretation on 05/12/2016 at 5:03 pm to Dr. Laurence Ferrari, who verbally acknowledged these results. 2. Stable left upper quadrant mass abutting the pancreas, probably invading the splenic hilum, with splenic and portal vein thrombosis. 3. Additional ancillary findings as above, stable since previous exam. Electronically Signed   By: Lucrezia Europe M.D.   On: 05/12/2016 17:04   Ct Biopsy  Result Date: 05/12/2016 INDICATION: 82 year old male with a prior history of renal cell carcinoma and a large left upper quadrant mass concerning for recurrent disease. He presents for biopsy. EXAM: CT BIOPSY MEDICATIONS: None. ANESTHESIA/SEDATION: Moderate (conscious) sedation was employed during this procedure. A total of Versed 1 mg and Fentanyl 50 mcg was administered  intravenously. Moderate Sedation Time: 10 minutes. The patient's level of consciousness and vital signs were monitored continuously by radiology nursing throughout the procedure under my direct supervision. FLUOROSCOPY TIME:  None COMPLICATIONS: None immediate. PROCEDURE: Informed written consent was obtained from the patient after a thorough discussion of the procedural risks, benefits and alternatives. All questions were addressed. A timeout was performed prior to the initiation of the procedure. A planning axial CT scan was performed. The large left upper quadrant abdominal mass was identified. A suitable skin entry site was selected and marked. The region was sterilely prepped and draped in standard fashion using chlorhexidine skin prep. Local anesthesia was attained by infiltration with 1% lidocaine. A small dermatotomy was made. Using intermittent CT guidance, a 19 gauge introducer needle was advanced into the margin of the mass. The introducer needle was positioned to biopsy needle rind of the lesion has centrally it is low attenuation and likely necrotic. Multiple 18 gauge core biopsies were then coaxially obtained using the bio Pince automated biopsy device. Biopsy specimens were placed in saline and delivered to pathology for further analysis. Following removal of the biopsy device and introducer needle, and axial CT scan was performed which demonstrates no hematoma or other complicating feature. The patient tolerated the procedure well. IMPRESSION: Technically successful CT-guided biopsy of left upper quadrant abdominal mass. Of note, the mass is centrally necrotic. Efforts were made to biopsy the peripheral rim of the lesion in hopes of obtaining viable tissue for diagnosis. Electronically Signed   By: Jacqulynn Cadet M.D.   On: 05/12/2016 13:03    Medications:   . antiseptic oral rinse  7 mL Mouth Rinse BID  . ceFEPime (MAXIPIME) IV  1 g Intravenous Q8H  . clopidogrel  75 mg Oral Daily  .  famotidine  20 mg Oral Daily  . guaiFENesin-dextromethorphan  10 mL Oral TID  . LORazepam  1 mg Oral QHS  . metoprolol succinate  50 mg Oral Daily  . multivitamin with minerals  1 tablet Oral Daily  . mycophenolate  1,000 mg Oral QHS  . mycophenolate  500 mg Oral Daily  . pyridostigmine  60 mg Oral QID  . sodium chloride flush  3 mL Intravenous Q12H  . tamsulosin  0.4 mg Oral Daily  . vancomycin  750 mg Intravenous Q12H   Continuous Infusions:    Time spent: 35 minutes.  The patient is medically complex with multiple co-morbidities and is at high risk for clinical deterioration and requires high complexity decision making.    LOS: 0 days   Cellie Dardis  [facility] Pager [phone]. If unable to reach me by pager, please call my cell phone at [phone].  *  Please refer to [email or web address], password [email or web address] to get updated schedule on who will round on this patient, as hospitalists switch teams weekly. If 7PM-7AM, please contact night-coverage at [email or web address], password [email or web address] for any overnight needs.  05/14/2016, 7:45 AM

## 2016-05-15 DIAGNOSIS — I248 Other forms of acute ischemic heart disease: Secondary | ICD-10-CM

## 2016-05-15 LAB — VANCOMYCIN, TROUGH: VANCOMYCIN TR: 12 ug/mL — AB (ref 15–20)

## 2016-05-15 MED ORDER — PYRIDOSTIGMINE BROMIDE 60 MG PO TABS
120.0000 mg | ORAL_TABLET | Freq: Four times a day (QID) | ORAL | Status: DC
  Administered 2016-05-15 – 2016-05-17 (×7): 120 mg via ORAL
  Filled 2016-05-15 (×9): qty 2

## 2016-05-15 MED ORDER — VANCOMYCIN HCL IN DEXTROSE 1-5 GM/200ML-% IV SOLN
1000.0000 mg | Freq: Two times a day (BID) | INTRAVENOUS | Status: AC
  Administered 2016-05-15 – 2016-05-19 (×9): 1000 mg via INTRAVENOUS
  Filled 2016-05-15 (×10): qty 200

## 2016-05-15 NOTE — Clinical Social Work Note (Addendum)
Clinical Social Work Assessment  Patient Details  Name: [name] MRN: [id number] Date of Birth: 1925-02-10  Date of referral:  05/15/16               Reason for consult:  Facility Placement                Permission sought to share information with:  Facility Sport and exercise psychologist, Family Supports Permission granted to share information::     Name::        Agency::   SNF  Relationship::   Wife  Contact Information:   [phone]  Housing/Transportation Living arrangements for the past 2 months:  Single Family Home Source of Information:  Spouse, Medical Team Patient Interpreter Needed:  None Criminal Activity/Legal Involvement Pertinent to Current Situation/Hospitalization:  No - Comment as needed Significant Relationships:  Spouse, Other Family Members Lives with:  Spouse Do you feel safe going back to the place where you live?  No Need for family participation in patient care:  Yes (Comment)  Care giving concerns: Patient sleeping at time of assessment. LCSWA gathered collateral information from patient wife, [name] [phone] Patient wife reports patient health has declined and he can no longer manage his care at home. Patient had two falls at home, in the bathroom and off the bed. She reports patient often forgets things and cannot remember to take his medications. She reports the patient can benefit from rehab at [facility].    Social Worker assessment / plan: LCSWA attempted to completed assessment with patient sleep at this time. Patient wife and RN gave collateral information. RN reports patient agreed to SNF when she asked. Patient family cannot take care of him at home.  Plan: Assist with patient disposition to SNF.   Employment status:  Retired Forensic scientist:  Medicare PT Recommendations:  [place] / Referral to community resources:  [place]  Patient/Family's Response to care: Patient is responded well to  care. Agreeable to SNF at this time.   Patient/Family's Understanding of and Emotional Response to Diagnosis, Current Treatment, and Prognosis: "I know he will want a private room, at [facility] or [facility]." Patient wife and family supportive and involved in patient care. She is hopeful patient will follow up with rehab and get better.   Emotional Assessment Appearance:  Appears stated age Attitude/Demeanor/Rapport:  Lethargic Affect (typically observed):    Orientation:    Alcohol / Substance use:  Not Applicable Psych involvement (Current and /or in the community):  No (Comment)  Discharge Needs  Concerns to be addressed:  Care Coordination, Home Safety Concerns, Cognitive Concerns Readmission within the last 30 days:  Yes Current discharge risk:  Dependent with Mobility Barriers to Discharge:  Ship broker, Continued Medical Work up   [employer], [place] 05/15/2016, 12:53 PM

## 2016-05-15 NOTE — Progress Notes (Signed)
Pharmacy Antibiotic Note  [name] is a 82 y.o. male with PMH myasthenia gravis, CAD who experienced tachycardia and weakness following biopsy of intra-abdominal mass today. Admitted 8/1-8/3 for urinary retention and sent home with Foley cath. Pharmacy has been consulted to dose vancomycin and cefepime for HCAP.  Today, 05/15/2016: - day #3 abx - vancomycin trough level is slightly subtherapeutic at 12 (goal 15-20) - afeb, wbc down wnl  - scr 0.79 (crcl~78)   Plan:  Increase Vancomycin to 1000 mg IV q12h  Continue Cefepime 1 gm IV q8 hr  ______________________  Height: 5\' 10"  (177.8 cm) Weight: 234 lb 12.6 oz (106.5 kg) IBW/kg (Calculated) : 73  Temp (24hrs), Avg:98.1 F (36.7 C), Min:97.7 F (36.5 C), Max:98.4 F (36.9 C)   Recent Labs Lab 05/12/16 0920 05/12/16 1500 05/13/16 0330 05/14/16 0525 05/15/16 0719  WBC 11.9* 8.8 14.7* 9.0  --   CREATININE  --  0.69 0.90 0.79  --   VANCOTROUGH  --   --   --   --  12*    Estimated Creatinine Clearance: 78 mL/min (by C-G formula based on SCr of 0.8 mg/dL).    Allergies  Allergen Reactions  . Dicyclomine Other (See Comments)    REACTION: can worsen myasthenia gravis    Antimicrobials this admission: cefepime 8/9 >>  vancomycin 8/9 >>   PTA Augmentin for UTI Recent admission from 8/1 to 8/3  Dose adjustments this admission: 8/12 VT at 0730=  12 (750 mg q12h)  Microbiology results: 8/1 BCx x2: NGF 8/2 UCx: NGF  8/9 urine legionella:neg FINAL 8/9 urine strep pneum: neg FINAL  Thank you for allowing pharmacy to be a part of this patient's care.  Lynelle Doctor 05/15/2016 8:13 AM

## 2016-05-15 NOTE — Progress Notes (Signed)
Progress Note    SIAOSI KRISHNAMOORTHY  [id number] DOB: 05-04-1925  DOA: 05/12/2016 PCP: [name], MD    Brief Narrative:   BUELL ZERBE is an 82 y.o. male with medical history significant of osteoarthritis, BPH, renal carcinoma, chronic kidney disease, coronary artery disease, diverticulitis, GERD, hypertension, myasthenia gravis, obesity, osteoporosis, recently treated for UTI, who was referred by interventional radiology after he was discovered to be tachycardic with heart rate in the 140s-150s after undergoing a biopsy to evaluate a left upper quadrant mass. In the ED, he was hydrated and given 50 mg of metoprolol with improvement of his heart rate into the 80s. He had mild elevated troponin at 0.11. Chest x-ray showed a left lower lobe infiltrate versus atelectasis.  Assessment/Plan:   Principal Problem:   Healthcare associated pneumonia complicated by sinus tachycardia ([place]) and demand ischemia The patient was admitted to telemetry with improved heart rates after receiving metoprolol. Troponins were elevated but trending down. This is most consistent with demand ischemia in the setting of tachycardia. TSH not elevated. Given his recent hospitalization, the patient was placed on cefepime and vancomycin. Follow-up blood cultures, sputum culture, Legionella pneumonia antigen. Strep pneumonia antigen was negative. With myasthenia gravis -- need to avoid floroquinolones, clinda, aminoglycoside, macrolide, tetra, sulfa, ampicillin, primaxin. When patient is ready to be transitioned to PO abx, will use cefpodoxime or cefuroxime.  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 Benay Spice of oncology who does not recommend blood thinners at this time. Follow-up biopsy results.    MYASTHENIA GRAVIS W/O (ACUTE) EXACERBATION Continue Mestinon and CellCept. We will increase Mestinon to 120 mg 4  times a day (maximum daily dose is 1500 mg/day) Needs to F/U with Dr. Jannifer Franklin.     Essential hypertension Continue metoprolol.    GERD Continue Protonix.    BPH (benign prostatic hyperplasia) Continue Flomax.    Abdominal pain/Abdominal mass, LUQ (left upper quadrant) Status post biopsy. Follow-up pathology report.    Normocytic anemia Stable. Monitor hemoglobin.    CAD (coronary artery disease)  Continue statin and beta blocker. Resume Plavix.   Family Communication/Anticipated D/C date and plan/Code Status   DVT prophylaxis: SCDs ordered. Code Status: Full Code.  Family Communication: Sister updated at the bedside. Disposition Plan: PT recommend SNF. Will consult social work. Not ready for discharge yet, but anticipate he will be stable enough for discharge in 48-72 hours.   Medical Consultants:    Telephone consult with Dr. Benay Spice   Procedures:    None  Anti-Infectives:   Cefepime/vancomycin 05/12/16-->  Subjective:   [name] is weak, still very difficult to understand. Nursing staff reports that he ate well today and that his bowels moved yesterday. No current complaints of pain. No dyspnea.  Objective:    Vitals:   05/14/16 0447 05/14/16 1300 05/14/16 2036 05/15/16 0528  BP: 125/67 119/66 114/71 115/80  Pulse: 77 79 88 86  Resp: 18 20 18 18   Temp: 98 F (36.7 C) 98.1 F (36.7 C) 98.4 F (36.9 C) 97.7 F (36.5 C)  TempSrc: Oral Axillary Oral Oral  SpO2: 94% 93% 100% 93%  Weight:      Height:        Intake/Output Summary (Last 24 hours) at 05/15/16 0840 Last data filed at 05/15/16 0700  Gross per 24 hour  Intake  1170 ml  Output              425 ml  Net              745 ml   Filed Weights   05/12/16 2131  Weight: 106.5 kg (234 lb 12.6 oz)    Exam: General exam: Appears calm and comfortable.  Respiratory system: Diminished breath sounds. Respiratory effort normal. Cardiovascular system: S1 & S2 heard, RRR. No JVD,   rubs, gallops or clicks. No murmurs. Gastrointestinal system: Abdomen is firm with a left upper quadrant mass.  Normal bowel sounds heard. Central nervous system: Lethargic, mildly confused. Generalized weakness. Speech difficult to understand. No focal neurological deficits. Extremities: No clubbing,  or cyanosis. No edema. Skin: No rashes, lesions or ulcers. Psychiatry: Judgement and insight appear normal. Mood & affect depressed.   Data Reviewed:   I have personally reviewed following labs and imaging studies:  Labs: Basic Metabolic Panel:  Recent Labs Lab 05/12/16 1500 05/13/16 0330 05/14/16 0525  NA 137 136 135  K 4.0 4.1 4.1  CL 105 107 107  CO2 22 23 24   GLUCOSE 97 106* 99  BUN 14 18 22*  CREATININE 0.69 0.90 0.79  CALCIUM 8.1* 7.8* 7.8*  MG 1.7  --   --    GFR Estimated Creatinine Clearance: 78 mL/min (by C-G formula based on SCr of 0.8 mg/dL). Liver Function Tests:  Recent Labs Lab 05/13/16 0330  AST 52*  ALT 26  ALKPHOS 168*  BILITOT 1.0  PROT 5.6*  ALBUMIN 1.9*   Coagulation profile  Recent Labs Lab 05/12/16 0920  INR 1.26    CBC:  Recent Labs Lab 05/12/16 0920 05/12/16 1500 05/13/16 0330 05/14/16 0525  WBC 11.9* 8.8 14.7* 9.0  NEUTROABS  --  7.7  --   --   HGB 11.2* 11.0* 11.0* 9.9*  HCT 33.6* 32.9* 32.4* 28.8*  MCV 90.1 90.4 90.0 88.9  PLT 339 324 335 300   Cardiac Enzymes:  Recent Labs Lab 05/12/16 1500 05/12/16 2152 05/13/16 0330  TROPONINI 0.11* 0.10* 0.06*   Thyroid function studies:  Recent Labs  05/13/16 0330  TSH 1.442    Microbiology No results found for this or any previous visit (from the past 240 hour(s)).  Radiology: Dg Chest 2 View  Result Date: 05/12/2016 CLINICAL DATA:  82 year old male with weakness and tachycardia. EXAM: CHEST  2 VIEW COMPARISON:  05/04/2016 and prior radiographs FINDINGS: This is a low volume film. Increased left lower lung opacity may represent atelectasis or airspace disease/  pneumonia. Mild pulmonary vascular congestion is noted. Upper limits normal heart size noted. No pneumothorax or definite pleural effusion noted. No acute bony abnormalities are identified. IMPRESSION: Low volume film with increased left lower lung opacity-question atelectasis versus airspace disease. Mild pulmonary vascular congestion. Electronically Signed   By: Margarette Canada M.D.   On: 05/12/2016 18:05   Ct Head Wo Contrast  Result Date: 05/12/2016 CLINICAL DATA:  Weakness. EXAM: CT HEAD WITHOUT CONTRAST TECHNIQUE: Contiguous axial images were obtained from the base of the skull through the vertex without intravenous contrast. COMPARISON:  11/22/2013 FINDINGS: Brain: No evidence of acute infarction, hemorrhage, hydrocephalus, or mass lesion/mass effect. Remote small bilateral cerebellar infarcts which appears stable from previous. Remote right posterior temporal and parietal cortically based infarct. Small remote bilateral occipital and right posterior frontal cortical infarcts. Small-vessel ischemic change in the cerebral white matter. Vascular: Atherosclerotic calcification. Opacified vessels from recent enhanced abdominal CT. Skull: Negative for fracture  or focal lesion. Sinuses/Orbits: Left sphenoid sinusitis is chronic based on prior, with fluid level currently. Bilateral cataract resection Other: None. IMPRESSION: 1. No acute intracranial finding or change compared to 2015. 2. Numerous remote infarcts as described above. 3. Chronic left sphenoid sinusitis.  Fluid level is present today. Electronically Signed   By: Monte Fantasia M.D.   On: 05/12/2016 18:18   Ct Abdomen Pelvis W Contrast  Result Date: 05/12/2016 CLINICAL DATA:  had a biopsy of a left upper quadrant mass today. tachycardia (he was in the 140-150 range after procedure); he arrives here drowsy and in no distress with a heart rate of ~125 and is a regular supraventricular tach. Family are with him also. He is drowsy and is slow to arouse  and answers all questions regarding his situation lucidly. He has an indwelling foley cath. In place and a sm. Clean dressing at left lower rib area EXAM: CT ABDOMEN AND PELVIS WITH CONTRAST TECHNIQUE: Multidetector CT imaging of the abdomen and pelvis was performed using the standard protocol following bolus administration of intravenous contrast. CONTRAST:  110mL ISOVUE-300 IOPAMIDOL (ISOVUE-300) INJECTION 61% COMPARISON:  Biopsy images from earlier the same day, and prior studies FINDINGS: Lower chest: Trace bilateral pleural effusions. Consolidation/ atelectasis posteriorly in the visualized lower lobes. Coronary calcifications. Hepatobiliary: Fatty liver without focal lesion. Partially calcified stones in the nondilated gallbladder. Pancreas: Mild atrophy in the mid body and head. There is masslike enlargement of pancreatic tail and a contiguous complex 17.8 cm mass with peripheral enhancement, central low-attenuation, stable compared to scans dating back to 05/04/2016. No evidence of hematoma or active extravasation. Spleen: The left upper quadrant mass abuts the splenic hilum. There are small wedge-shaped areas of decreased enhancement in the spleen suggesting direct involvement by the mass. Splenic vein thrombosis. Adrenals/Urinary Tract: Previous left nephrectomy. Right adrenal gland and kidney unremarkable. Foley catheter decompresses urinary bladder. Stomach/Bowel: Stomach is decompressed, displaced medially by at the left upper quadrant mass without definite invasion. Small bowel and colon are nondilated. Innumerable sigmoid diverticula without adjacent inflammatory/edematous change. Vascular/Lymphatic: Moderate aortoiliac arterial calcifications without aneurysm or stenosis. Cavernous transformation of the portal vein. Apparent thrombosis of the splenic vein. SMV remains patent. Reproductive: No mass or other significant abnormality. Other: Scattered low-attenuation ascites in the pelvis and pericolic  gutters left greater than right. No suggestion of hemoperitoneum. Musculoskeletal: Left lateral body wall hernia containing only fat. Multiple stable lower thoracic and lumbar compression deformities, post vertebral augmentation at T10 and L1. IMPRESSION: 1. Negative for hemorrhage or other acute complication of left upper quadrant mass biopsy. Critical Value/emergent results were called by telephone at the time of interpretation on 05/12/2016 at 5:03 pm to Dr. Laurence Ferrari, who verbally acknowledged these results. 2. Stable left upper quadrant mass abutting the pancreas, probably invading the splenic hilum, with splenic and portal vein thrombosis. 3. Additional ancillary findings as above, stable since previous exam. Electronically Signed   By: Lucrezia Europe M.D.   On: 05/12/2016 17:04   Ct Biopsy  Result Date: 05/12/2016 INDICATION: 82 year old male with a prior history of renal cell carcinoma and a large left upper quadrant mass concerning for recurrent disease. He presents for biopsy. EXAM: CT BIOPSY MEDICATIONS: None. ANESTHESIA/SEDATION: Moderate (conscious) sedation was employed during this procedure. A total of Versed 1 mg and Fentanyl 50 mcg was administered intravenously. Moderate Sedation Time: 10 minutes. The patient's level of consciousness and vital signs were monitored continuously by radiology nursing throughout the procedure under my direct supervision. FLUOROSCOPY TIME:  None COMPLICATIONS: None immediate. PROCEDURE: Informed written consent was obtained from the patient after a thorough discussion of the procedural risks, benefits and alternatives. All questions were addressed. A timeout was performed prior to the initiation of the procedure. A planning axial CT scan was performed. The large left upper quadrant abdominal mass was identified. A suitable skin entry site was selected and marked. The region was sterilely prepped and draped in standard fashion using chlorhexidine skin prep. Local  anesthesia was attained by infiltration with 1% lidocaine. A small dermatotomy was made. Using intermittent CT guidance, a 19 gauge introducer needle was advanced into the margin of the mass. The introducer needle was positioned to biopsy needle rind of the lesion has centrally it is low attenuation and likely necrotic. Multiple 18 gauge core biopsies were then coaxially obtained using the bio Pince automated biopsy device. Biopsy specimens were placed in saline and delivered to pathology for further analysis. Following removal of the biopsy device and introducer needle, and axial CT scan was performed which demonstrates no hematoma or other complicating feature. The patient tolerated the procedure well. IMPRESSION: Technically successful CT-guided biopsy of left upper quadrant abdominal mass. Of note, the mass is centrally necrotic. Efforts were made to biopsy the peripheral rim of the lesion in hopes of obtaining viable tissue for diagnosis. Electronically Signed   By: Jacqulynn Cadet M.D.   On: 05/12/2016 13:03    Medications:   . antiseptic oral rinse  7 mL Mouth Rinse BID  . ceFEPime (MAXIPIME) IV  1 g Intravenous Q8H  . clopidogrel  75 mg Oral Daily  . famotidine  20 mg Oral Daily  . guaiFENesin-dextromethorphan  10 mL Oral TID  . LORazepam  1 mg Oral QHS  . metoprolol succinate  50 mg Oral Daily  . multivitamin with minerals  1 tablet Oral Daily  . mycophenolate  1,000 mg Oral QHS  . mycophenolate  500 mg Oral Daily  . pyridostigmine  60 mg Oral QID  . sodium chloride flush  3 mL Intravenous Q12H  . tamsulosin  0.4 mg Oral Daily  . vancomycin  1,000 mg Intravenous Q12H   Continuous Infusions:    Time spent: 35 minutes.  The patient is medically complex with multiple co-morbidities and is at high risk for clinical deterioration and requires high complexity decision making.    LOS: 1 day   RAMA,CHRISTINA  [facility] Pager [phone]. If unable to reach me by pager, please  call my cell phone at [phone].  *Please refer to [email or web address], password [email or web address] to get updated schedule on who will round on this patient, as hospitalists switch teams weekly. If 7PM-7AM, please contact night-coverage at [email or web address], password [email or web address] for any overnight needs.  05/15/2016, 8:40 AM

## 2016-05-15 NOTE — Progress Notes (Signed)
Physical Therapy Treatment Patient Details Name: [name] MRN: [id number] DOB: 1925-02-16 Today's Date: 05/15/2016    History of Present Illness 82 y.o. male with medical history significant of osteoarthritis, BPH, renal carcinoma, chronic kidney disease, coronary artery disease, diverticulitis, GERD, hypertension, myasthenia gravis, obesity, osteoporosis, recently treated for UTI and admitted after pt developed sinus tachycardia in the 140s and 150s after getting a biopsy for LUQ mass with interventional radiology    PT Comments    Pt confused and oriented only to self; insists that he just got off an airplane; he  Couldn't state the date or anything related to current situation; Pt with significantly incr WOB and sats decr during gait on RA; RN aware; O2 replaced--see below for further info; will continue to follow Continue to recommend SNF  Follow Up Recommendations  SNF;Supervision/Assistance - 24 hour     Equipment Recommendations  Rolling walker with 5" wheels    Recommendations for Other Services       Precautions / Restrictions Precautions Precautions: Fall Restrictions Weight Bearing Restrictions: No    Mobility  Bed Mobility Overal bed mobility: Needs Assistance Bed Mobility: Supine to Sit     Supine to sit: Min assist;Mod assist     General bed mobility comments: assist for trunk and facilitation of LEs off EOB, incr time and cues for task sequencing  Transfers Overall transfer level: Needs assistance Equipment used: Rolling walker (2 wheeled) Transfers: Sit to/from Stand Sit to Stand: Mod assist;+2 physical assistance;+2 safety/equipment         General transfer comment: assist for rise and steady as well as controlling descent  Ambulation/Gait Ambulation/Gait assistance: Min assist;+2 physical assistance;+2 safety/equipment Ambulation Distance (Feet): 45 Feet Assistive device: Rolling walker (2 wheeled) Gait Pattern/deviations: Step-through  pattern;Decreased stride length;Trunk flexed     General Gait Details: assist for balance and to maneuver RW safely; pt reports fatigue and with incr WOB; chair to pt for safety; O2 sats 94% prior to amb, 78%  during amb, 92% after 2L O2  replaced;    Stairs            Wheelchair Mobility    Modified Rankin (Stroke Patients Only)       Balance   Sitting-balance support: No upper extremity supported;Feet supported Sitting balance-Leahy Scale: Fair     Standing balance support: During functional activity;Bilateral upper extremity supported Standing balance-Leahy Scale: Poor                      Cognition Arousal/Alertness: Awake/alert Behavior During Therapy: WFL for tasks assessed/performed Overall Cognitive Status: Within Functional Limits for tasks assessed                      Exercises      General Comments        Pertinent Vitals/Pain      Home Living                      Prior Function            PT Goals (current goals can now be found in the care plan section) Acute Rehab PT Goals Patient Stated Goal: pt wants to get his tests done and get home PT Goal Formulation: With patient Time For Goal Achievement: 05/27/16 Potential to Achieve Goals: Good Progress towards PT goals: Progressing toward goals    Frequency  Min 3X/week    PT Plan Current plan remains appropriate  Co-evaluation             End of Session Equipment Utilized During Treatment: Gait belt Activity Tolerance: Patient limited by fatigue Patient left: in chair;with call bell/phone within reach;with chair alarm set     Time: [id number] PT Time Calculation (min) (ACUTE ONLY): 32 min  Charges:  $Gait Training: 8-22 mins $Therapeutic Activity: 8-22 mins                    G Codes:      [name] 05/27/16, 4:14 PM

## 2016-05-15 NOTE — NC FL2 (Signed)
[place] LEVEL OF CARE SCREENING TOOL     IDENTIFICATION  Patient Name: [name] Birthdate: 1924-12-02 Sex: male Admission Date (Current Location): 05/12/2016  [facility] and [place] Number:  Herbalist and Address:  [facility],  [place] [address] [place]      Provider Number: [id number]  Attending Physician Name and Address:  Venetia Maxon Rama, MD  Relative Name and Phone Number:       Current Level of Care: Hospital Recommended Level of Care: [place] Prior Approval Number:    Date Approved/Denied:   PASRR Number:    Discharge Plan: SNF    Current Diagnoses: Patient Active Problem List   Diagnosis Date Noted  . Demand ischemia ([place]) 05/14/2016  . HCAP (healthcare-associated pneumonia) 05/13/2016  . 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 Portal vein thrombosis 05/13/2016  . Sinus tachycardia ([place]) 05/12/2016  . Abdominal pain 05/04/2016  . GI bleed 05/04/2016  . Abdominal mass, LUQ (left upper quadrant) 05/04/2016  . Normocytic anemia 05/04/2016  . CAD (coronary artery disease) 05/04/2016  . SIRS (systemic inflammatory response syndrome) ([place]) 05/04/2016  . Failure to thrive in adult 05/04/2016  . Urinary retention   . Pancreatic lesion 04/20/2016  . Recurrent UTI 04/20/2016  . First degree heart block 10/08/2015  . Environmental allergies 09/03/2013  . Hyperlipidemia with target LDL less than 70 07/25/2013  . Hx of unilateral nephrectomy 07/25/2013  . Failed total knee, right ([place]) 03/31/2012  . ABDOMINAL PAIN, ACUTE 04/16/2010  . ABDOMINAL PAIN, LOWER 04/16/2010  . COUGH 12/11/2009  . VIRAL GASTROENTERITIS 10/27/2009  . SKIN LESIONS, MULTIPLE 10/27/2009  . DEGENERATIVE JOINT DISEASE 10/27/2009  . RIB PAIN, RIGHT SIDED 08/13/2009  . TINEA CRURIS 06/11/2009  . UNSPECIFIED HEARING LOSS 06/11/2009  . LIPOMA 10/26/2007  . LEG CRAMPS 10/26/2007  . BPH (benign prostatic hyperplasia) 07/18/2007   . Personal history of renal cell carcinoma 03/27/2007  . MYASTHENIA GRAVIS W/O (ACUTE) EXACERBATION 03/27/2007  . Essential hypertension 03/27/2007  . Coronary atherosclerosis 03/27/2007  . GERD 03/27/2007  . COLONIC POLYPS, BENIGN, HX OF 03/27/2007  . DIVERTICULITIS, HX OF 03/27/2007    Orientation RESPIRATION BLADDER Height & Weight     Self  Normal Indwelling catheter Weight: 234 lb 12.6 oz (106.5 kg) Height:  5\' 10"  (177.8 cm)  BEHAVIORAL SYMPTOMS/MOOD NEUROLOGICAL BOWEL NUTRITION STATUS      Incontinent Diet (Dysphagia 3 (Mech soft);Nectar-thick liquid )  AMBULATORY STATUS COMMUNICATION OF NEEDS Skin   Limited Assist Verbally Normal                       Personal Care Assistance Level of Assistance  Bathing, Feeding, Dressing Bathing Assistance: Limited assistance Feeding assistance: Limited assistance Dressing Assistance: Limited assistance     Functional Limitations Info  Sight, Hearing, Speech Sight Info: Adequate Hearing Info: Adequate Speech Info: Adequate    SPECIAL CARE FACTORS FREQUENCY  PT (By licensed PT), OT (By licensed OT)     PT Frequency: 5 OT Frequency: min 2x/week             Contractures Contractures Info: Not present    Additional Factors Info  Code Status Code Status Info: Fullcode             Current Medications (05/15/2016):  This is the current hospital active medication list Current Facility-Administered Medications  Medication Dose Route Frequency Provider Last Rate Last Dose  . antiseptic oral rinse (CPC / CETYLPYRIDINIUM  CHLORIDE 0.05%) solution 7 mL  7 mL Mouth Rinse BID Venetia Maxon Rama, MD   7 mL at 05/15/16 1000  . ceFEPIme (MAXIPIME) 1 g in dextrose 5 % 50 mL IVPB  1 g Intravenous Q8H Reubin Milan, MD   1 g at 05/15/16 0527  . clopidogrel (PLAVIX) tablet 75 mg  75 mg Oral Daily Venetia Maxon Rama, MD   75 mg at 05/15/16 0923  . famotidine (PEPCID) tablet 20 mg  20 mg Oral Daily Venetia Maxon Rama, MD   20 mg at  05/15/16 [id number]  . guaiFENesin-dextromethorphan (ROBITUSSIN DM) 100-10 MG/5ML syrup 10 mL  10 mL Oral TID Venetia Maxon Rama, MD   10 mL at 05/15/16 0922  . levalbuterol (XOPENEX) nebulizer solution 0.63 mg  0.63 mg Nebulization Q6H PRN Reubin Milan, MD   0.63 mg at 05/13/16 0727  . LORazepam (ATIVAN) tablet 1 mg  1 mg Oral QHS Venetia Maxon Rama, MD   1 mg at 05/13/16 2238  . metoprolol succinate (TOPROL-XL) 24 hr tablet 50 mg  50 mg Oral Daily Reubin Milan, MD   50 mg at 05/15/16 0923  . multivitamin with minerals tablet 1 tablet  1 tablet Oral Daily Reubin Milan, MD   1 tablet at 05/15/16 [phone]  . mycophenolate (CELLCEPT) capsule 1,000 mg  1,000 mg Oral QHS Reubin Milan, MD   1,000 mg at 05/13/16 2237  . mycophenolate (CELLCEPT) capsule 500 mg  500 mg Oral Daily Reubin Milan, MD   500 mg at 05/15/16 [id number]  . polyethylene glycol (MIRALAX / GLYCOLAX) packet 17 g  17 g Oral Daily PRN Reubin Milan, MD      . pyridostigmine (MESTINON) tablet 60 mg  60 mg Oral QID Reubin Milan, MD   60 mg at 05/15/16 [id number]  . [place]   Oral PRN Venetia Maxon Rama, MD      . senna (SENOKOT) tablet 17.2 mg  2 tablet Oral QHS PRN Reubin Milan, MD      . sodium chloride flush (NS) 0.9 % injection 3 mL  3 mL Intravenous Q12H Reubin Milan, MD   3 mL at 05/12/16 2235  . tamsulosin (FLOMAX) capsule 0.4 mg  0.4 mg Oral Daily Reubin Milan, MD   0.4 mg at 05/15/16 [id number]  . vancomycin (VANCOCIN) IVPB 1000 mg/200 mL premix  1,000 mg Intravenous Q12H Anh P Pham, RPH   1,000 mg at 05/15/16 [id number]     Discharge Medications: Please see discharge summary for a list of discharge medications.  Relevant Imaging Results:  Relevant Lab Results:   Additional Information ss#[id number]  Lia Hopping, LCSW

## 2016-05-16 ENCOUNTER — Inpatient Hospital Stay (HOSPITAL_COMMUNITY): Payer: Medicare Other

## 2016-05-16 NOTE — Plan of Care (Signed)
Problem: Safety: Goal: Ability to remain free from injury will improve Outcome: Completed/Met Date Met: 05/16/16 Safety precaution prevention measures reviewed with pt's sister while at bedside. bed alarm set to assist

## 2016-05-16 NOTE — Progress Notes (Signed)
Progress Note    [name]  [id number] DOB: 08/26/1925  DOA: 05/12/2016 PCP: [name], MD    Brief Narrative:   RIDER Nolan is an 82 y.o. male with medical history significant of osteoarthritis, BPH, renal carcinoma, chronic kidney disease, coronary artery disease, diverticulitis, GERD, hypertension, myasthenia gravis, obesity, osteoporosis, recently treated for UTI, who was referred by interventional radiology after he was discovered to be tachycardic with heart rate in the 140s-150s after undergoing a biopsy to evaluate a left upper quadrant mass. In the ED, he was hydrated and given 50 mg of metoprolol with improvement of his heart rate into the 80s. He had mild elevated troponin at 0.11. Chest x-ray showed a left lower lobe infiltrate versus atelectasis.  Assessment/Plan:   Principal Problem:   Healthcare associated pneumonia complicated by sinus tachycardia ([name]) and demand ischemia The patient was admitted to telemetry with improved heart rates after receiving metoprolol. Troponins were elevated but trending down. This is most consistent with demand ischemia in the setting of tachycardia. TSH not elevated. Given his recent hospitalization, the patient was placed on cefepime and vancomycin. Follow-up blood cultures, sputum culture. Legionella pneumonia antigenWas negative. Strep pneumonia antigen was negative. With myasthenia gravis -- need to avoid floroquinolones, clinda, aminoglycoside, macrolide, tetra, sulfa, ampicillin, primaxin. When patient is ready to be transitioned to PO abx, will use cefpodoxime or cefuroxime.  Active Problems:   Persistent lethargy No improvement despite increased dose Mestinon. Neuro exam nonfocal, but will obtain MRI of the brain to rule out stroke.    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 Benay Spice of oncology who does not recommend blood thinners  at this time. Follow-up biopsy results.    MYASTHENIA GRAVIS  Continue Mestinon and CellCept. We will increase Mestinon to 120 mg 4 times a day (maximum daily dose is 1500 mg/day) Needs to F/U with Dr. Jannifer Franklin.     Essential hypertension Continue metoprolol.    GERD Continue Protonix.    BPH (benign prostatic hyperplasia) Continue Flomax.    Abdominal pain/Abdominal mass, LUQ (left upper quadrant) Status post biopsy. Follow-up pathology report.    Normocytic anemia Stable. Monitor hemoglobin.    CAD (coronary artery disease)  Continue statin and beta blocker. Resume Plavix.   Family Communication/Anticipated D/C date and plan/Code Status   DVT prophylaxis: SCDs ordered. Code Status: Full Code.  Family Communication: Sister updated at the bedside. Disposition Plan: PT recommend SNF. Will consult social work. Not ready for discharge yet, but anticipate he will be stable enough for discharge in 48-72 hours.   Medical Consultants:    Telephone consult with Dr. Benay Spice   Procedures:    None  Anti-Infectives:   Cefepime/vancomycin 05/12/16-->  Subjective:   [name] is weak, still very difficult to understand. No specific complaints except for weakness. Remains sleepy and unable to stay awake during attempts to interact with him.  Objective:    Vitals:   05/15/16 0528 05/15/16 1355 05/15/16 2059 05/16/16 0432  BP: 115/80 125/60 123/68 126/71  Pulse: 86 77 77 79  Resp: 18 16 18 16   Temp: 97.7 F (36.5 C) 98.6 F (37 C) 98.7 F (37.1 C) 98.4 F (36.9 C)  TempSrc: Oral Axillary Oral Oral  SpO2: 93% 94% 95% 96%  Weight:      Height:        Intake/Output Summary (Last 24 hours) at  05/16/16 0848 Last data filed at 05/16/16 0600  Gross per 24 hour  Intake              740 ml  Output              700 ml  Net               40 ml   Filed Weights   05/12/16 2131  Weight: 106.5 kg (234 lb 12.6 oz)    Exam: General exam: Appears calm and  comfortable.  Respiratory system: Diminished breath sounds. Respiratory effort normal. Cardiovascular system: S1 & S2 heard, RRR. No JVD,  rubs, gallops or clicks. No murmurs. Gastrointestinal system: Abdomen is firm with a left upper quadrant mass.  Normal bowel sounds heard. Central nervous system: Lethargic, mildly confused. Generalized weakness. Speech difficult to understand. No focal neurological deficits. Extremities: No clubbing,  or cyanosis. No edema. Skin: No rashes, lesions or ulcers. Psychiatry: Judgement and insight appear normal. Mood & affect depressed.   Data Reviewed:   I have personally reviewed following labs and imaging studies:  Labs: Basic Metabolic Panel:  Recent Labs Lab 05/12/16 1500 05/13/16 0330 05/14/16 0525  NA 137 136 135  K 4.0 4.1 4.1  CL 105 107 107  CO2 22 23 24   GLUCOSE 97 106* 99  BUN 14 18 22*  CREATININE 0.69 0.90 0.79  CALCIUM 8.1* 7.8* 7.8*  MG 1.7  --   --    GFR Estimated Creatinine Clearance: 78 mL/min (by C-G formula based on SCr of 0.8 mg/dL). Liver Function Tests:  Recent Labs Lab 05/13/16 0330  AST 52*  ALT 26  ALKPHOS 168*  BILITOT 1.0  PROT 5.6*  ALBUMIN 1.9*   Coagulation profile  Recent Labs Lab 05/12/16 0920  INR 1.26    CBC:  Recent Labs Lab 05/12/16 0920 05/12/16 1500 05/13/16 0330 05/14/16 0525  WBC 11.9* 8.8 14.7* 9.0  NEUTROABS  --  7.7  --   --   HGB 11.2* 11.0* 11.0* 9.9*  HCT 33.6* 32.9* 32.4* 28.8*  MCV 90.1 90.4 90.0 88.9  PLT 339 324 335 300   Cardiac Enzymes:  Recent Labs Lab 05/12/16 1500 05/12/16 2152 05/13/16 0330  TROPONINI 0.11* 0.10* 0.06*   Thyroid function studies: No results for input(s): TSH, T4TOTAL, T3FREE, THYROIDAB in the last 72 hours.  Invalid input(s): FREET3  Microbiology No results found for this or any previous visit (from the past 240 hour(s)).  Radiology: Dg Chest 2 View  Result Date: 05/12/2016 CLINICAL DATA:  82 year old male with weakness and  tachycardia. EXAM: CHEST  2 VIEW COMPARISON:  05/04/2016 and prior radiographs FINDINGS: This is a low volume film. Increased left lower lung opacity may represent atelectasis or airspace disease/ pneumonia. Mild pulmonary vascular congestion is noted. Upper limits normal heart size noted. No pneumothorax or definite pleural effusion noted. No acute bony abnormalities are identified. IMPRESSION: Low volume film with increased left lower lung opacity-question atelectasis versus airspace disease. Mild pulmonary vascular congestion. Electronically Signed   By: Margarette Canada M.D.   On: 05/12/2016 18:05   Ct Head Wo Contrast  Result Date: 05/12/2016 CLINICAL DATA:  Weakness. EXAM: CT HEAD WITHOUT CONTRAST TECHNIQUE: Contiguous axial images were obtained from the base of the skull through the vertex without intravenous contrast. COMPARISON:  11/22/2013 FINDINGS: Brain: No evidence of acute infarction, hemorrhage, hydrocephalus, or mass lesion/mass effect. Remote small bilateral cerebellar infarcts which appears stable from previous. Remote right posterior temporal and parietal  cortically based infarct. Small remote bilateral occipital and right posterior frontal cortical infarcts. Small-vessel ischemic change in the cerebral white matter. Vascular: Atherosclerotic calcification. Opacified vessels from recent enhanced abdominal CT. Skull: Negative for fracture or focal lesion. Sinuses/Orbits: Left sphenoid sinusitis is chronic based on prior, with fluid level currently. Bilateral cataract resection Other: None. IMPRESSION: 1. No acute intracranial finding or change compared to 2015. 2. Numerous remote infarcts as described above. 3. Chronic left sphenoid sinusitis.  Fluid level is present today. Electronically Signed   By: Monte Fantasia M.D.   On: 05/12/2016 18:18   Ct Abdomen Pelvis W Contrast  Result Date: 05/12/2016 CLINICAL DATA:  had a biopsy of a left upper quadrant mass today. tachycardia (he was in the  140-150 range after procedure); he arrives here drowsy and in no distress with a heart rate of ~125 and is a regular supraventricular tach. Family are with him also. He is drowsy and is slow to arouse and answers all questions regarding his situation lucidly. He has an indwelling foley cath. In place and a sm. Clean dressing at left lower rib area EXAM: CT ABDOMEN AND PELVIS WITH CONTRAST TECHNIQUE: Multidetector CT imaging of the abdomen and pelvis was performed using the standard protocol following bolus administration of intravenous contrast. CONTRAST:  144mL ISOVUE-300 IOPAMIDOL (ISOVUE-300) INJECTION 61% COMPARISON:  Biopsy images from earlier the same day, and prior studies FINDINGS: Lower chest: Trace bilateral pleural effusions. Consolidation/ atelectasis posteriorly in the visualized lower lobes. Coronary calcifications. Hepatobiliary: Fatty liver without focal lesion. Partially calcified stones in the nondilated gallbladder. Pancreas: Mild atrophy in the mid body and head. There is masslike enlargement of pancreatic tail and a contiguous complex 17.8 cm mass with peripheral enhancement, central low-attenuation, stable compared to scans dating back to 05/04/2016. No evidence of hematoma or active extravasation. Spleen: The left upper quadrant mass abuts the splenic hilum. There are small wedge-shaped areas of decreased enhancement in the spleen suggesting direct involvement by the mass. Splenic vein thrombosis. Adrenals/Urinary Tract: Previous left nephrectomy. Right adrenal gland and kidney unremarkable. Foley catheter decompresses urinary bladder. Stomach/Bowel: Stomach is decompressed, displaced medially by at the left upper quadrant mass without definite invasion. Small bowel and colon are nondilated. Innumerable sigmoid diverticula without adjacent inflammatory/edematous change. Vascular/Lymphatic: Moderate aortoiliac arterial calcifications without aneurysm or stenosis. Cavernous transformation of the  portal vein. Apparent thrombosis of the splenic vein. SMV remains patent. Reproductive: No mass or other significant abnormality. Other: Scattered low-attenuation ascites in the pelvis and pericolic gutters left greater than right. No suggestion of hemoperitoneum. Musculoskeletal: Left lateral body wall hernia containing only fat. Multiple stable lower thoracic and lumbar compression deformities, post vertebral augmentation at T10 and L1. IMPRESSION: 1. Negative for hemorrhage or other acute complication of left upper quadrant mass biopsy. Critical Value/emergent results were called by telephone at the time of interpretation on 05/12/2016 at 5:03 pm to Dr. Laurence Ferrari, who verbally acknowledged these results. 2. Stable left upper quadrant mass abutting the pancreas, probably invading the splenic hilum, with splenic and portal vein thrombosis. 3. Additional ancillary findings as above, stable since previous exam. Electronically Signed   By: Lucrezia Europe M.D.   On: 05/12/2016 17:04   Ct Biopsy  Result Date: 05/12/2016 INDICATION: 82 year old male with a prior history of renal cell carcinoma and a large left upper quadrant mass concerning for recurrent disease. He presents for biopsy. EXAM: CT BIOPSY MEDICATIONS: None. ANESTHESIA/SEDATION: Moderate (conscious) sedation was employed during this procedure. A total of Versed 1 mg  and Fentanyl 50 mcg was administered intravenously. Moderate Sedation Time: 10 minutes. The patient's level of consciousness and vital signs were monitored continuously by radiology nursing throughout the procedure under my direct supervision. FLUOROSCOPY TIME:  None COMPLICATIONS: None immediate. PROCEDURE: Informed written consent was obtained from the patient after a thorough discussion of the procedural risks, benefits and alternatives. All questions were addressed. A timeout was performed prior to the initiation of the procedure. A planning axial CT scan was performed. The large left upper  quadrant abdominal mass was identified. A suitable skin entry site was selected and marked. The region was sterilely prepped and draped in standard fashion using chlorhexidine skin prep. Local anesthesia was attained by infiltration with 1% lidocaine. A small dermatotomy was made. Using intermittent CT guidance, a 19 gauge introducer needle was advanced into the margin of the mass. The introducer needle was positioned to biopsy needle rind of the lesion has centrally it is low attenuation and likely necrotic. Multiple 18 gauge core biopsies were then coaxially obtained using the bio Pince automated biopsy device. Biopsy specimens were placed in saline and delivered to pathology for further analysis. Following removal of the biopsy device and introducer needle, and axial CT scan was performed which demonstrates no hematoma or other complicating feature. The patient tolerated the procedure well. IMPRESSION: Technically successful CT-guided biopsy of left upper quadrant abdominal mass. Of note, the mass is centrally necrotic. Efforts were made to biopsy the peripheral rim of the lesion in hopes of obtaining viable tissue for diagnosis. Electronically Signed   By: Jacqulynn Cadet M.D.   On: 05/12/2016 13:03    Medications:   . antiseptic oral rinse  7 mL Mouth Rinse BID  . ceFEPime (MAXIPIME) IV  1 g Intravenous Q8H  . clopidogrel  75 mg Oral Daily  . famotidine  20 mg Oral Daily  . guaiFENesin-dextromethorphan  10 mL Oral TID  . metoprolol succinate  50 mg Oral Daily  . multivitamin with minerals  1 tablet Oral Daily  . mycophenolate  1,000 mg Oral QHS  . mycophenolate  500 mg Oral Daily  . pyridostigmine  120 mg Oral QID  . sodium chloride flush  3 mL Intravenous Q12H  . tamsulosin  0.4 mg Oral Daily  . vancomycin  1,000 mg Intravenous Q12H   Continuous Infusions:    Time spent: 35 minutes.  The patient is medically complex with multiple co-morbidities and is at high risk for clinical  deterioration and requires high complexity decision making.    LOS: 2 days   RAMA,CHRISTINA  [facility] Pager [phone]. If unable to reach me by pager, please call my cell phone at [phone].  *Please refer to [email or web address], password [email or web address] to get updated schedule on who will round on this patient, as hospitalists switch teams weekly. If 7PM-7AM, please contact night-coverage at [email or web address], password [email or web address] for any overnight needs.  05/16/2016, 8:48 AM

## 2016-05-17 ENCOUNTER — Telehealth: Payer: Self-pay | Admitting: Neurology

## 2016-05-17 ENCOUNTER — Inpatient Hospital Stay (HOSPITAL_COMMUNITY): Payer: Medicare Other

## 2016-05-17 DIAGNOSIS — R339 Retention of urine, unspecified: Secondary | ICD-10-CM | POA: Diagnosis not present

## 2016-05-17 DIAGNOSIS — Z905 Acquired absence of kidney: Secondary | ICD-10-CM

## 2016-05-17 DIAGNOSIS — G7000 Myasthenia gravis without (acute) exacerbation: Secondary | ICD-10-CM

## 2016-05-17 DIAGNOSIS — Z466 Encounter for fitting and adjustment of urinary device: Secondary | ICD-10-CM

## 2016-05-17 DIAGNOSIS — I639 Cerebral infarction, unspecified: Secondary | ICD-10-CM | POA: Diagnosis present

## 2016-05-17 DIAGNOSIS — C801 Malignant (primary) neoplasm, unspecified: Secondary | ICD-10-CM

## 2016-05-17 DIAGNOSIS — I129 Hypertensive chronic kidney disease with stage 1 through stage 4 chronic kidney disease, or unspecified chronic kidney disease: Secondary | ICD-10-CM

## 2016-05-17 DIAGNOSIS — R1902 Left upper quadrant abdominal swelling, mass and lump: Secondary | ICD-10-CM

## 2016-05-17 DIAGNOSIS — C799 Secondary malignant neoplasm of unspecified site: Secondary | ICD-10-CM | POA: Diagnosis present

## 2016-05-17 DIAGNOSIS — N189 Chronic kidney disease, unspecified: Secondary | ICD-10-CM

## 2016-05-17 DIAGNOSIS — C649 Malignant neoplasm of unspecified kidney, except renal pelvis: Secondary | ICD-10-CM

## 2016-05-17 DIAGNOSIS — I2510 Atherosclerotic heart disease of native coronary artery without angina pectoris: Secondary | ICD-10-CM

## 2016-05-17 MED ORDER — GADOBENATE DIMEGLUMINE 529 MG/ML IV SOLN
20.0000 mL | Freq: Once | INTRAVENOUS | Status: AC | PRN
  Administered 2016-05-17: 20 mL via INTRAVENOUS

## 2016-05-17 MED ORDER — LORAZEPAM 2 MG/ML IJ SOLN
0.5000 mg | Freq: Once | INTRAMUSCULAR | Status: AC
  Administered 2016-05-17: 0.5 mg via INTRAVENOUS
  Filled 2016-05-17: qty 1

## 2016-05-17 MED ORDER — PYRIDOSTIGMINE BROMIDE 60 MG PO TABS
60.0000 mg | ORAL_TABLET | Freq: Four times a day (QID) | ORAL | Status: DC
  Administered 2016-05-17 – 2016-05-25 (×29): 60 mg via ORAL
  Filled 2016-05-17 (×35): qty 1

## 2016-05-17 NOTE — Telephone Encounter (Signed)
Please call patient wife  I have reviewed today's office note: Disposition Plan: Transfer to [facility] for stroke service evaluation.  His current clinical worsening is multifactorial, aging, multiple medical history detailed below, myasthenia gravis does not seems to play a major role in his current clinical worsening, follow-up hospitalist advise.  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 Benay Spice of oncology who does not recommend blood thinners at this time. Biopsy shows poorly differentiated carcinoma, likely from renal cell metastasis given his history of renal cell cancer and papillary features. Given poor prognosis and multiple comorbidities, I have requested a palliative care consultation to address CODE STATUS and goals of care.    MYASTHENIA GRAVIS  Continue Mestinon and CellCept. Mestinon dose reduced to home maintenance dose of 60 mg 4 times a day.    Abdominal pain/Abdominal mass, LUQ (left upper quadrant) Status post biopsy. Follow-up pathology report.  . Code Status: Full Code.  Family Communication: Wife updated by telephone, informed of the MRI findings and pathology results.

## 2016-05-17 NOTE — Progress Notes (Addendum)
Progress Note    [name]  [id number] DOB: 1925-05-24  DOA: 05/12/2016 PCP: [name], MD    Brief Narrative:   [name] is an 82 y.o. male with medical history significant of osteoarthritis, BPH, renal carcinoma, chronic kidney disease, coronary artery disease, diverticulitis, GERD, hypertension, myasthenia gravis, obesity, osteoporosis, recently treated for UTI, who was referred by interventional radiology after he was discovered to be tachycardic with heart rate in the 140s-150s after undergoing a biopsy to evaluate a left upper quadrant mass. In the ED, he was hydrated and given 50 mg of metoprolol with improvement of his heart rate into the 80s. He had mild elevated troponin at 0.11. Chest x-ray showed a left lower lobe infiltrate versus atelectasis.He was admitted and treated for HCAP.  Over the course of his hospitalization, the patient continued to be lethargic and was initially thought to have a myasthenia gravis flare. He did not respond to a increase in his dose of Mestinon and further imaging was therefore obtained with an MRI of his brain which showed a large acute left PCA infarct as well as acute embolic infarcts in the cerebellum and right cerebral hemisphere. Stroke service was consulted and he was transferred to [facility] for further evaluation and recommendations regarding treatment.  Assessment/Plan:   Principal Problems:   Healthcare associated pneumonia complicated by sinus tachycardia ([place]) and demand ischemia The patient was admitted to telemetry with improved heart rates after receiving metoprolol. Troponins were elevated but trending down. This is most consistent with demand ischemia in the setting of tachycardia. TSH not elevated. Given his recent hospitalization, the patient was placed on cefepime and vancomycin. It does not appear that blood cultures were sent. Legionella pneumonia and strep pneumonia antigens were negative. With myasthenia gravis --  need to avoid floroquinolones, clinda, aminoglycoside, macrolide, tetra, sulfa, ampicillin, primaxin. When patient is ready to be transitioned to PO abx, will use cefpodoxime or cefuroxime.     Large acute left PCA infarct with petechial hemorrhage/small acute embolic infarcts in the cerebellum and right cerebral hemisphere in the setting of chronic ischemic small vessel cerebrovascular disease Plavix have been held prior to his biopsy, which may have triggered the stroke although in speaking with the patient's wife, he has been lethargic for a couple of weeks. Plavix had been resumed post biopsy on 05/13/16. Neurology consultation requested with plans to transfer the patient to [facility] for evaluation by the stroke service. Stroke appears to be embolic. 2-D echo done on 05/13/16. No embolic source noted.  Active Problems:   Persistent lethargy Suspect lethargy now related to acute CVA rather than myasthenia Gravis flare. Will reduce Mestinon dose back to maintenance dose.    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 Benay Spice of oncology who does not recommend blood thinners at this time. Biopsy shows poorly differentiated carcinoma, likely from renal cell metastasis given his history of renal cell cancer and papillary features. Given poor prognosis and multiple comorbidities, I have requested a palliative care consultation to address CODE STATUS and goals of care.    MYASTHENIA GRAVIS  Continue Mestinon and CellCept. Mestinon dose reduced to home maintenance dose of 60 mg 4 times a day.    Essential hypertension Continue metoprolol.    GERD Continue Protonix.    BPH (benign prostatic hyperplasia) Continue Flomax.    Abdominal pain/Abdominal mass, LUQ (left upper quadrant) Status post  biopsy. Follow-up pathology report.    Normocytic anemia Stable. Monitor hemoglobin.    CAD (coronary artery disease)   Continue statin and beta blocker. Resume Plavix.   Family Communication/Anticipated D/C date and plan/Code Status   DVT prophylaxis: SCDs ordered. Code Status: Full Code.  Family Communication: Wife updated by telephone, informed of the MRI findings and pathology results. Disposition Plan: Transfer to [facility] for stroke service evaluation.   Medical Consultants:    Telephone consult with Dr. Benay Spice   Procedures:    None  Anti-Infectives:   Cefepime/vancomycin 05/12/16-->  Subjective:   [name] remains very weak and falls asleep when stimulated. No specific complaints other than he feels tired. No dyspnea. Bowels are moving.  Objective:    Vitals:   05/16/16 2011 05/16/16 2012 05/17/16 0428 05/17/16 0841  BP: (!) 122/59  111/63   Pulse: 72  71   Resp: 20  20   Temp: 97.6 F (36.4 C)  98.1 F (36.7 C)   TempSrc: Oral  Axillary   SpO2: 98% 98% 97% 95%  Weight:      Height:        Intake/Output Summary (Last 24 hours) at 05/17/16 0903 Last data filed at 05/17/16 0841  Gross per 24 hour  Intake              300 ml  Output              700 ml  Net             -400 ml   Filed Weights   05/12/16 2131  Weight: 106.5 kg (234 lb 12.6 oz)    Exam: General exam: Appears calm and comfortable.  Respiratory system: Diminished breath sounds. Respiratory effort normal. Cardiovascular system: S1 & S2 heard, RRR. No JVD,  rubs, gallops or clicks. No murmurs. Gastrointestinal system: Abdomen is firm with a left upper quadrant mass. Normal bowel sounds heard. Central nervous system: More lethargic today, mildly confused. Generalized weakness. Speech difficult to understand. No focal neurological deficits, but possibly a subtle weakness on the left. Unable to fully assess due to lethargy. Extremities: No clubbing,  or cyanosis. No edema. Skin: No rashes, lesions or ulcers. Psychiatry: Too lethargic to assess.   Data Reviewed:   I have personally reviewed following  labs and imaging studies:  Labs: Basic Metabolic Panel:  Recent Labs Lab 05/12/16 1500 05/13/16 0330 05/14/16 0525  NA 137 136 135  K 4.0 4.1 4.1  CL 105 107 107  CO2 22 23 24   GLUCOSE 97 106* 99  BUN 14 18 22*  CREATININE 0.69 0.90 0.79  CALCIUM 8.1* 7.8* 7.8*  MG 1.7  --   --    GFR Estimated Creatinine Clearance: 78 mL/min (by C-G formula based on SCr of 0.8 mg/dL). Liver Function Tests:  Recent Labs Lab 05/13/16 0330  AST 52*  ALT 26  ALKPHOS 168*  BILITOT 1.0  PROT 5.6*  ALBUMIN 1.9*   Coagulation profile  Recent Labs Lab 05/12/16 0920  INR 1.26    CBC:  Recent Labs Lab 05/12/16 0920 05/12/16 1500 05/13/16 0330 05/14/16 0525  WBC 11.9* 8.8 14.7* 9.0  NEUTROABS  --  7.7  --   --   HGB 11.2* 11.0* 11.0* 9.9*  HCT 33.6* 32.9* 32.4* 28.8*  MCV 90.1 90.4 90.0 88.9  PLT 339 324 335 300   Cardiac Enzymes:  Recent Labs Lab 05/12/16 1500 05/12/16 2152 05/13/16 0330  TROPONINI 0.11* 0.10* 0.06*  Thyroid function studies: No results for input(s): TSH, T4TOTAL, T3FREE, THYROIDAB in the last 72 hours.  Invalid input(s): FREET3  Microbiology No results found for this or any previous visit (from the past 240 hour(s)).  Radiology: Dg Chest 2 View  Result Date: 05/12/2016 CLINICAL DATA:  82 year old male with weakness and tachycardia. EXAM: CHEST  2 VIEW COMPARISON:  05/04/2016 and prior radiographs FINDINGS: This is a low volume film. Increased left lower lung opacity may represent atelectasis or airspace disease/ pneumonia. Mild pulmonary vascular congestion is noted. Upper limits normal heart size noted. No pneumothorax or definite pleural effusion noted. No acute bony abnormalities are identified. IMPRESSION: Low volume film with increased left lower lung opacity-question atelectasis versus airspace disease. Mild pulmonary vascular congestion. Electronically Signed   By: Margarette Canada M.D.   On: 05/12/2016 18:05   Ct Head Wo Contrast  Result  Date: 05/12/2016 CLINICAL DATA:  Weakness. EXAM: CT HEAD WITHOUT CONTRAST TECHNIQUE: Contiguous axial images were obtained from the base of the skull through the vertex without intravenous contrast. COMPARISON:  11/22/2013 FINDINGS: Brain: No evidence of acute infarction, hemorrhage, hydrocephalus, or mass lesion/mass effect. Remote small bilateral cerebellar infarcts which appears stable from previous. Remote right posterior temporal and parietal cortically based infarct. Small remote bilateral occipital and right posterior frontal cortical infarcts. Small-vessel ischemic change in the cerebral white matter. Vascular: Atherosclerotic calcification. Opacified vessels from recent enhanced abdominal CT. Skull: Negative for fracture or focal lesion. Sinuses/Orbits: Left sphenoid sinusitis is chronic based on prior, with fluid level currently. Bilateral cataract resection Other: None. IMPRESSION: 1. No acute intracranial finding or change compared to 2015. 2. Numerous remote infarcts as described above. 3. Chronic left sphenoid sinusitis.  Fluid level is present today. Electronically Signed   By: Monte Fantasia M.D.   On: 05/12/2016 18:18   Ct Abdomen Pelvis W Contrast  Result Date: 05/12/2016 CLINICAL DATA:  had a biopsy of a left upper quadrant mass today. tachycardia (he was in the 140-150 range after procedure); he arrives here drowsy and in no distress with a heart rate of ~125 and is a regular supraventricular tach. Family are with him also. He is drowsy and is slow to arouse and answers all questions regarding his situation lucidly. He has an indwelling foley cath. In place and a sm. Clean dressing at left lower rib area EXAM: CT ABDOMEN AND PELVIS WITH CONTRAST TECHNIQUE: Multidetector CT imaging of the abdomen and pelvis was performed using the standard protocol following bolus administration of intravenous contrast. CONTRAST:  138mL ISOVUE-300 IOPAMIDOL (ISOVUE-300) INJECTION 61% COMPARISON:  Biopsy images  from earlier the same day, and prior studies FINDINGS: Lower chest: Trace bilateral pleural effusions. Consolidation/ atelectasis posteriorly in the visualized lower lobes. Coronary calcifications. Hepatobiliary: Fatty liver without focal lesion. Partially calcified stones in the nondilated gallbladder. Pancreas: Mild atrophy in the mid body and head. There is masslike enlargement of pancreatic tail and a contiguous complex 17.8 cm mass with peripheral enhancement, central low-attenuation, stable compared to scans dating back to 05/04/2016. No evidence of hematoma or active extravasation. Spleen: The left upper quadrant mass abuts the splenic hilum. There are small wedge-shaped areas of decreased enhancement in the spleen suggesting direct involvement by the mass. Splenic vein thrombosis. Adrenals/Urinary Tract: Previous left nephrectomy. Right adrenal gland and kidney unremarkable. Foley catheter decompresses urinary bladder. Stomach/Bowel: Stomach is decompressed, displaced medially by at the left upper quadrant mass without definite invasion. Small bowel and colon are nondilated. Innumerable sigmoid diverticula without adjacent inflammatory/edematous change.  Vascular/Lymphatic: Moderate aortoiliac arterial calcifications without aneurysm or stenosis. Cavernous transformation of the portal vein. Apparent thrombosis of the splenic vein. SMV remains patent. Reproductive: No mass or other significant abnormality. Other: Scattered low-attenuation ascites in the pelvis and pericolic gutters left greater than right. No suggestion of hemoperitoneum. Musculoskeletal: Left lateral body wall hernia containing only fat. Multiple stable lower thoracic and lumbar compression deformities, post vertebral augmentation at T10 and L1. IMPRESSION: 1. Negative for hemorrhage or other acute complication of left upper quadrant mass biopsy. Critical Value/emergent results were called by telephone at the time of interpretation on  05/12/2016 at 5:03 pm to Dr. Laurence Ferrari, who verbally acknowledged these results. 2. Stable left upper quadrant mass abutting the pancreas, probably invading the splenic hilum, with splenic and portal vein thrombosis. 3. Additional ancillary findings as above, stable since previous exam. Electronically Signed   By: Lucrezia Europe M.D.   On: 05/12/2016 17:04   Ct Biopsy  Result Date: 05/12/2016 INDICATION: 82 year old male with a prior history of renal cell carcinoma and a large left upper quadrant mass concerning for recurrent disease. He presents for biopsy. EXAM: CT BIOPSY MEDICATIONS: None. ANESTHESIA/SEDATION: Moderate (conscious) sedation was employed during this procedure. A total of Versed 1 mg and Fentanyl 50 mcg was administered intravenously. Moderate Sedation Time: 10 minutes. The patient's level of consciousness and vital signs were monitored continuously by radiology nursing throughout the procedure under my direct supervision. FLUOROSCOPY TIME:  None COMPLICATIONS: None immediate. PROCEDURE: Informed written consent was obtained from the patient after a thorough discussion of the procedural risks, benefits and alternatives. All questions were addressed. A timeout was performed prior to the initiation of the procedure. A planning axial CT scan was performed. The large left upper quadrant abdominal mass was identified. A suitable skin entry site was selected and marked. The region was sterilely prepped and draped in standard fashion using chlorhexidine skin prep. Local anesthesia was attained by infiltration with 1% lidocaine. A small dermatotomy was made. Using intermittent CT guidance, a 19 gauge introducer needle was advanced into the margin of the mass. The introducer needle was positioned to biopsy needle rind of the lesion has centrally it is low attenuation and likely necrotic. Multiple 18 gauge core biopsies were then coaxially obtained using the bio Pince automated biopsy device. Biopsy specimens  were placed in saline and delivered to pathology for further analysis. Following removal of the biopsy device and introducer needle, and axial CT scan was performed which demonstrates no hematoma or other complicating feature. The patient tolerated the procedure well. IMPRESSION: Technically successful CT-guided biopsy of left upper quadrant abdominal mass. Of note, the mass is centrally necrotic. Efforts were made to biopsy the peripheral rim of the lesion in hopes of obtaining viable tissue for diagnosis. Electronically Signed   By: Jacqulynn Cadet M.D.   On: 05/12/2016 13:03   Mr Jeri Cos [id number] Contrast  Result Date: 05/17/2016 CLINICAL DATA:  Slurred speech. EXAM: MRI HEAD WITHOUT AND WITH CONTRAST TECHNIQUE: Multiplanar, multiecho pulse sequences of the brain and surrounding structures were obtained without and with intravenous contrast. CONTRAST:  36mL MULTIHANCE GADOBENATE DIMEGLUMINE 529 MG/ML IV SOLN COMPARISON:  Head CT 05/12/2016 and MRI 02/04/2004 FINDINGS: The study is moderately motion degraded despite repeated imaging and utilizing faster, more motion resistant protocols. There is a large acute left PCA infarct involving the temporal and occipital lobes. A small amount of confluent petechial hemorrhage is present in the anterior left occipital lobe. Small acute infarcts are present in both  cerebral hemispheres, right caudate nucleus, and right frontal, right occipital, and right parietal lobe cortex. There are chronic cortical infarcts involving the right parietal greater than right frontal lobes. Small volume chronic blood products are noted in the right parietal lobe. There are chronic bilateral cerebellar infarcts. T2 hyperintensities elsewhere in the deep cerebral white matter bilaterally are nonspecific but compatible with mild-to-moderate chronic small vessel ischemic disease. There is moderate cerebral atrophy. No mass, midline shift, or extra-axial fluid collection is seen. No abnormal  enhancement is identified within limitations of motion. Prior bilateral cataract extraction is noted. Circumferential mucosal thickening and fluid are present in the left sphenoid sinus. The mastoid air cells are clear. Major intracranial vascular flow voids are grossly preserved with vertebrobasilar and ICA dolichoectasia. IMPRESSION: 1. Large acute left PCA infarct with petechial hemorrhage. 2. Small acute embolic infarcts in the cerebellum and right cerebral hemisphere. 3. Chronic ischemic changes as above. Electronically Signed   By: Logan Bores M.D.   On: 05/17/2016 14:16    Medications:   . antiseptic oral rinse  7 mL Mouth Rinse BID  . ceFEPime (MAXIPIME) IV  1 g Intravenous Q8H  . clopidogrel  75 mg Oral Daily  . famotidine  20 mg Oral Daily  . guaiFENesin-dextromethorphan  10 mL Oral TID  . LORazepam  0.5 mg Intravenous Once  . metoprolol succinate  50 mg Oral Daily  . multivitamin with minerals  1 tablet Oral Daily  . mycophenolate  1,000 mg Oral QHS  . mycophenolate  500 mg Oral Daily  . pyridostigmine  120 mg Oral QID  . sodium chloride flush  3 mL Intravenous Q12H  . tamsulosin  0.4 mg Oral Daily  . vancomycin  1,000 mg Intravenous Q12H   Continuous Infusions:    Time spent: 35 minutes.  The patient is medically complex with multiple co-morbidities and is at high risk for clinical deterioration and requires high complexity decision making.    LOS: 3 days   [facility] Pager [phone]. If unable to reach me by pager, please call my cell phone at [phone].  *Please refer to [email or web address], password [email or web address] to get updated schedule on who will round on this patient, as hospitalists switch teams weekly. If 7PM-7AM, please contact night-coverage at [email or web address], password [email or web address] for any overnight needs.  05/17/2016, 9:03 AM

## 2016-05-17 NOTE — Progress Notes (Signed)
Patient arrived to 66M 16 not alert or oriented. Patient transferred to bed and tele applied. Vitals taken. Bed alarm activated. MD paged of pt's arrival as well as pt's wife. Will continue to monitor. Hassel Uphoff, Rande Brunt, RN

## 2016-05-17 NOTE — Progress Notes (Signed)
Speech Language Pathology Treatment: Dysphagia  Patient Details Name: [name] MRN: [id number] DOB: August 21, 1925 Today's Date: 05/17/2016 Time: [id number] SLP Time Calculation (min) (ACUTE ONLY): 14 min  Assessment / Plan / Recommendation Clinical Impression  ST follow up for therapeutic diet tolerance.  Nursing stated there have been no reports of obvious issues with intake.  Chart review indicated that the patient has been afebrile, lungs have been clear but diminished and intake has been poor.  Upon ST entry into the room the patient was sleeping in bed.  He did rouse but was lethargic.  The patient reported that he felt like he was having some trouble breathing.  This seemed to be helped by sitting more upright.  The patient was confused and not able to follow all commands.  Meal observation was completed using the patient's meal tray.  No obvious s/s of aspiration were seen.  The patient was noted to have significant belching following all liquid boluses and he requested to stop eating because he felt few after only a small amount of intake.  Recommend continue with a dysphagia 3 diet with nectar thick liquids.  Swallow precautions as posted.  ST to continue to follow to assess for need for MBS vs diet advancement.     HPI HPI: 82 yo male adm to [facility] with weakness - recent biopsy for LUQ mass - pt was diagnosed with HCAP.  PMH + for GERD, renal cancer, Myasthenia Gravis on Mestinon.  CT head negative.  CXR left lower lobe opacity.  Swallow evaluation ordered.         SLP Plan  Continue with current plan of care     Recommendations  Diet recommendations: Dysphagia 3 (mechanical soft);Nectar-thick liquid Liquids provided via: Cup Medication Administration: Whole meds with puree Supervision: Staff to assist with self feeding Compensations: Slow rate;Small sips/bites Postural Changes and/or Swallow Maneuvers: Seated upright 90 degrees;Upright 30-60 min after meal             Oral Care  Recommendations: Oral care BID Follow up Recommendations:  (TBD) Plan: Continue with current plan of care     [place], [place], [place] [facility] SLP [phone] Lamar Sprinkles 05/17/2016, 8:22 AM

## 2016-05-17 NOTE — Telephone Encounter (Signed)
Wife called, states husband is in [place] hospital x 1 week, states "he's getting sicker all the time, has myasthenia gravis, all he wants to do is sleep all the time", states they're going to send him to rehab, "if they send him to rehab, he'll die".

## 2016-05-17 NOTE — Progress Notes (Signed)
Patient left via [facility], report given by Doroteo Bradford RN to receiving RN at [facility].

## 2016-05-17 NOTE — Progress Notes (Signed)
Results of MRI called to Dr. Rockne Menghini. Will continue to monitor.

## 2016-05-17 NOTE — Consult Note (Signed)
Neurology Consult Note  Reason for Consultation: Stroke  Requesting provider: Jacquelynn Cree, MD  CC: Patient is encephalopathic and unable to provide history. History is therefore obtained from review of the medical record. No family is present at the bedside at the time of my visit.  HPI: This is an 82 year old man who initially presented to him [facility] on 05/12/16 for biopsy of a left upper quadrant abdominal mass. He was taken off of his Plavix for several days prior to his biopsy. CT-guided biopsy was performed as an outpatient procedure. However, following the biopsy, he developed tachycardia with heart rate in the 140s to 150s. EKG was obtained and reportedly showed supraventricular tachycardia. He was transferred to the emergency department for further evaluation. Per ED notes, he was drowsy and slow to arouse but able to answer all questions in the ED. Initial workup included a chest x-ray that showed a possible infiltrate in the left lower lobe. CT of the head was obtained and apparently did not show any acute abnormality. He was given IV fluids and 50 mg of metoprolol with resolution of his tachycardia. He was noted to have an elevated troponin of 0.11. On the admitting hospitalist's exam, the patient was noted to be oriented to self and only partially oriented to place and situation. No focal neurologic abnormalities were observed.  He was admitted to the hospital for further treatment of presumed healthcare-associated pneumonia for which he was placed on cefepime and vancomycin. His Plavix was resumed on 05/13/16. He remained somnolent and encephalopathic, and it was initially assume that this was related to his myasthenia gravis. His Mestinon dose was increased but had no effect on his mental status. Due to persistent lethargy, MRI scan of the brain was ordered for further evaluation. This was completed today and showed scattered areas of acute ischemic infarction. This has prompted  transfer to [facility] for stroke workup.  PMH:  Past Medical History:  Diagnosis Date  . Arthritis    back, hips, knees   . Benign prostatic hypertrophy   . Cancer [facility])    h/o renal carcinoma   . Chronic kidney disease    h/o UTI-2012  . Coronary artery disease    sees Dr. Shelva Majestic   . Diverticulitis   . GERD (gastroesophageal reflux disease)   . H/O epistaxis   . Heart disease   . Hypertension   . Myasthenia gravis [facility])    sees Dr. Jannifer Franklin  . Obesity    exogenous  . Osteoporosis   . Partial small bowel obstruction ([place]) 04/16/10   resolved with bowel rest  . Pulmonary embolus ([place])    2000  . Shortness of breath   . Urinary incontinence   . UTI (urinary tract infection)     PSH:  Past Surgical History:  Procedure Laterality Date  . ANGIOPLASTY     had 2 stent replacements  . CARDIAC CATHETERIZATION     2008, stents   . CARDIAC CATHETERIZATION  2008  . COLONOSCOPY  05/28/10   per Dr. Laurence Spates, diverticulosis, no repeats planned   . DOPPLER ECHOCARDIOGRAPHY    . EYE SURGERY     cataracts(bilateral)  removed, ?iol  . JOINT REPLACEMENT     2005-R, L knee replacement- 2000  . NEPHRECTOMY     left  . NM MYOVIEW LTD  02/2011   moderate inferior scar no ischemia  . SP KYPHOPLASTY     and vertebroplasty  . stented placed  1/08  .  stress dipyridamole myocardial perfusion    . TONSILLECTOMY    . TOTAL KNEE ARTHROPLASTY     both Sees Dr. Novella Olive  . TOTAL KNEE REVISION  03/28/2012   Procedure: TOTAL KNEE REVISION;  Surgeon: Hessie Dibble, MD;  Location: [place];  Service: Orthopedics;  Laterality: Right;    Family history: Family History  Problem Relation Age of Onset  . Colon cancer Mother     Social history:  Social History   Social History  . Marital status: Married    Spouse name: N/A  . Number of children: 3  . Years of education: HS   Occupational History  . Retired [employer] Topics  . Smoking  status: Former Smoker    Types: Cigarettes    Quit date: 03/22/1959  . Smokeless tobacco: Never Used     Comment: quit 1970s  . Alcohol use No  . Drug use: No  . Sexual activity: Not Currently   Other Topics Concern  . Not on file   Social History Narrative   Patient is retired   Patient has a high school education       Patient drinks 1-2 cups of caffeine daily.   Patient is right handed.     Current outpatient meds: Current Facility-Administered Medications for the 05/12/16 encounter [facility] Encounter)  Medication  . senna (SENOKOT) tablet 17.2 mg   Current Meds  Medication Sig  . amoxicillin-clavulanate (AUGMENTIN) 250-125 MG tablet Take 1 tablet by mouth 3 (three) times daily.  . clopidogrel (PLAVIX) 75 MG tablet Take 75 mg by mouth daily.  . metoprolol succinate (TOPROL-XL) 50 MG 24 hr tablet TAKE 1 TABLET ONCE DAILY.  . Multiple Vitamin (MULTIVITAMIN WITH MINERALS) TABS Take 1 tablet by mouth daily.  . mycophenolate (CELLCEPT) 500 MG tablet TAKE 1 TABLET EVERY MORNING AND 2 TABLETS AT BEDTIME.  . polyethylene glycol (MIRALAX / GLYCOLAX) packet Take 17 g by mouth daily. (Patient taking differently: Take 17 g by mouth daily as needed for moderate constipation. )  . pyridostigmine (MESTINON) 60 MG tablet TAKE  (1)  TABLET  FOUR TIMES DAILY. (Patient taking differently: TAKE  (1)  TABLET TWO  TIMES DAILY.)  . tamsulosin (FLOMAX) 0.4 MG CAPS capsule Take 1 capsule (0.4 mg total) by mouth daily.    Current inpatient meds:  Current Facility-Administered Medications  Medication Dose Route Frequency Provider Last Rate Last Dose  . antiseptic oral rinse (CPC / CETYLPYRIDINIUM CHLORIDE 0.05%) solution 7 mL  7 mL Mouth Rinse BID Venetia Maxon Rama, MD   7 mL at 05/17/16 1000  . ceFEPIme (MAXIPIME) 1 g in dextrose 5 % 50 mL IVPB  1 g Intravenous Q8H Reubin Milan, MD   1 g at 05/17/16 1504  . clopidogrel (PLAVIX) tablet 75 mg  75 mg Oral Daily Venetia Maxon Rama, MD   75 mg at  05/17/16 0933  . famotidine (PEPCID) tablet 20 mg  20 mg Oral Daily Venetia Maxon Rama, MD   20 mg at 05/17/16 0933  . guaiFENesin-dextromethorphan (ROBITUSSIN DM) 100-10 MG/5ML syrup 10 mL  10 mL Oral TID Venetia Maxon Rama, MD   10 mL at 05/17/16 0934  . levalbuterol (XOPENEX) nebulizer solution 0.63 mg  0.63 mg Nebulization Q6H PRN Reubin Milan, MD   0.63 mg at 05/16/16 2012  . metoprolol succinate (TOPROL-XL) 24 hr tablet 50 mg  50 mg Oral Daily Reubin Milan, MD   50 mg at 05/17/16  [id number]  . multivitamin with minerals tablet 1 tablet  1 tablet Oral Daily Reubin Milan, MD   1 tablet at 05/17/16 0933  . mycophenolate (CELLCEPT) capsule 1,000 mg  1,000 mg Oral QHS Reubin Milan, MD   1,000 mg at 05/16/16 2310  . mycophenolate (CELLCEPT) capsule 500 mg  500 mg Oral Daily Reubin Milan, MD   500 mg at 05/17/16 0933  . polyethylene glycol (MIRALAX / GLYCOLAX) packet 17 g  17 g Oral Daily PRN Reubin Milan, MD      . pyridostigmine (MESTINON) tablet 60 mg  60 mg Oral QID Venetia Maxon Rama, MD   60 mg at 05/17/16 1717  . [place]   Oral PRN Venetia Maxon Rama, MD      . senna (SENOKOT) tablet 17.2 mg  2 tablet Oral QHS PRN Reubin Milan, MD      . sodium chloride flush (NS) 0.9 % injection 3 mL  3 mL Intravenous Q12H Reubin Milan, MD   3 mL at 05/17/16 2117  . tamsulosin (FLOMAX) capsule 0.4 mg  0.4 mg Oral Daily Reubin Milan, MD   0.4 mg at 05/17/16 0933  . vancomycin (VANCOCIN) IVPB 1000 mg/200 mL premix  1,000 mg Intravenous Q12H Anh P Pham, RPH   1,000 mg at 05/17/16 [id number]    Allergies: Allergies  Allergen Reactions  . Dicyclomine Other (See Comments)    REACTION: can worsen myasthenia gravis    ROS: As per HPI. A full 14-point review of systems is limited by the patient's degree of somnolence and poor attention.   PE:  BP 124/63 (BP Location: Left Arm)   Pulse 72   Temp 97.7 F (36.5 C) (Oral)   Resp 18   Ht 5\' 10"  (1.778 m)    Wt 109.5 kg (241 lb 8 oz)   SpO2 93%   BMI 34.65 kg/m   General: WD obese Caucasian man, lying in bed with eyes closed, moaning intermittently. He will open his eyes to voice but requires constant stimulation to remain awake, thus limiting the exam. He will attempt to answer some questions but his speech is markedly dysarthric and poorly intelligible; this could be due at least in part to his degree of somnolence. He is oriented to self only. He knows he is in a hospital but thinks he is in [place]. He will follow some commands with delay but overall effort with the exam is poor. HEENT: Normocephalic. Neck supple without LAD. MM dry. I am only able to visualize the most anterior portion of his mouth. Sclerae anicteric. No conjunctival injection.  CV: Regular, no obvious murmur. Carotid pulses full and symmetric, no bruits. Distal pulses 2+ and symmetric.  Lungs: CTAB on anterior exam with breath sounds somewhat diminished inferiorly on both sides, L>R.  Abdomen: Soft, obese. No apparent tenderness. He has a palpable mass in the LUQ. Bowel sounds present x4.  Extremities: No C/C/E. Neuro:  CN: Pupils are 1 mm bilaterally, difficult to ascertain reactivity due to miosis. His eyes are conjugate. He is able to track me around his bed with grossly normal EOMI, though he tends to lose track of things as they move past the midline to his right. He does not blink to visual threat from the right side. He has intact corneals bilaterally. Face is symmetric at rest with normal mobility. Hearing appears intact to conversational voice. The remainder of his cranial nerves cannot be accurately assessed as he  does not participate with the exam.  Motor: Normal bulk and tone throughout. He moves all four extremities spontaneously with at least 4/5 strength. Effort with confrontational testing is poor and inconsistent. No tremor or other abnormal movements were observed. Sensation: He withdraws to mild noxious  stimulation x4.   DTRs: 2+, symmetric with the exception of absent ankle jerks. Toes downgoing bilaterally. No pathologic reflexes.  Coordination and gait: These cannot be assessed as the patient is not able to participate with the exam.   Labs:  Lab Results  Component Value Date   WBC 9.0 05/14/2016   HGB 9.9 (L) 05/14/2016   HCT 28.8 (L) 05/14/2016   PLT 300 05/14/2016   GLUCOSE 99 05/14/2016   CHOL 122 10/15/2015   TRIG 118 10/15/2015   HDL 37 (L) 10/15/2015   LDLCALC 61 10/15/2015   ALT 26 05/13/2016   AST 52 (H) 05/13/2016   NA 135 05/14/2016   K 4.1 05/14/2016   CL 107 05/14/2016   CREATININE 0.79 05/14/2016   BUN 22 (H) 05/14/2016   CO2 24 05/14/2016   TSH 1.442 05/13/2016   INR 1.26 05/12/2016   Urinalysis 05/12/16 showed specific gravity greater than 1.046, moderate hemoglobin, 6-30 rbc's, rare bacteria  Imaging:  I have personally and independently reviewed the MRI scan of the brain from 05/17/16. This shows a large area of restricted diffusion in the left PCA territory involving the medial left temporal lobe and the left occipital lobe. Additional punctate areas of restricted diffusion are noted in the right caudate, right occipital lobe, and right cerebellum. There is patchy restricted diffusion in the cortex of the right frontal and right parietal lobes. All of these are consistent with acute ischemic infarction. Chronic areas of encephalomalacia are seen in the right frontal and parietal lobes consistent with old infarcts. Moderate to severe degree of chronic small vessel ischemic changes also noted in the bihemispheric white matter. Moderate diffuse generalized atrophy is present.  Other diagnostic studies:  TTE from 05/13/16 showed ejection fraction 55-60% with no regional wall motion abnormalities. Grade 1 diastolic dysfunction was noted. The aortic root was noted to be mildly dilated. Mild calcification of the mitral valve annulus is present with mild mitral  regurgitation. The left atrium is moderately dilated.  Assessment and Plan: 1. Acute Ischemic Stroke: This is an acute stroke involving multiple vascular territories and both the anterior and posterior circulations.. It is most likely embolic in etiology with a proximal source such as the heart or the proximal aorta. Known risk factors for cerebrovascular disease in this patient include prior stroke, coronary artery disease, hypertension, age, obesity. His Plavix was held for several days prior to his biopsy, and this may have increased his risk for stroke as a result. Additional workup will be ordered to include fasting lipids, and hemoglobin a1c. Further testing will be deferred to the stroke team and may include transesophageal echocardiogram to further assess the endocardium and proximal aorta. Recommend continued antiplatelet therapy with Plavix for secondary stroke prevention for now. Ensure adequate glucose control. Blood pressure can be gradually tight into a goal systolic blood pressure less than 140 mmHg at this point, avoiding dramatic reductions in blood pressure to prevent worsening of stroke. Avoid fever and hyperglycemia as these can extend the infarct. Avoid hypotonic IVF to minimize exacerbation of post-stroke edema. Initiate rehab services. DVT prophylaxis as needed.   2. Right homonymous hemianopsia: This is acute, and due to stroke. PT, OT.  3. Encephalopathy: This is most  likely multifactorial in etiology with contributions from acute stroke, pneumonia, medication effect, prolonged hospitalization. All of this is superimposed upon poor cerebral reserve which places him at increased risk for encephalopathy and delirium from any cause. At this point, treatment is supportive. Continue to optimize metabolic status. Minimize sedating medications, particularly benzodiazepines, opiates, and anything with strong anticholinergic properties. Continue to treat underlying infection as you are. Every  effort should be made to regulate his sleep-wake cycles, keeping his room bright and active during the daytime and dark and quiet at night.  4. Myasthenia gravis: This does not appear to be a significant contributor at this point. Continue Mestinon and CellCept at his outpatient doses. Medications to be avoided include fluoroquinolones, tetracyclines, macrolides, and aminoglycosides. Beta blockers and calcium channel blockers should be used with caution in the myasthenic patient as these can exacerbate weakness. He will need to be watched cautiously while on metoprolol.  Thank you for the opportunity to participate in Mr. Jacques care. Please send hesitate to call with any questions or concerns.   The stroke team will assume care beginning with morning rounds 05/18/16.

## 2016-05-18 ENCOUNTER — Inpatient Hospital Stay (HOSPITAL_COMMUNITY): Payer: Medicare Other

## 2016-05-18 DIAGNOSIS — I82441 Acute embolism and thrombosis of right tibial vein: Secondary | ICD-10-CM

## 2016-05-18 DIAGNOSIS — I639 Cerebral infarction, unspecified: Secondary | ICD-10-CM

## 2016-05-18 DIAGNOSIS — I6340 Cerebral infarction due to embolism of unspecified cerebral artery: Secondary | ICD-10-CM

## 2016-05-18 DIAGNOSIS — Z515 Encounter for palliative care: Secondary | ICD-10-CM

## 2016-05-18 DIAGNOSIS — Z7189 Other specified counseling: Secondary | ICD-10-CM

## 2016-05-18 DIAGNOSIS — I82401 Acute embolism and thrombosis of unspecified deep veins of right lower extremity: Secondary | ICD-10-CM | POA: Diagnosis present

## 2016-05-18 DIAGNOSIS — M7989 Other specified soft tissue disorders: Secondary | ICD-10-CM

## 2016-05-18 LAB — BASIC METABOLIC PANEL
Anion gap: 6 (ref 5–15)
BUN: 14 mg/dL (ref 6–20)
CHLORIDE: 104 mmol/L (ref 101–111)
CO2: 22 mmol/L (ref 22–32)
CREATININE: 0.76 mg/dL (ref 0.61–1.24)
Calcium: 8.1 mg/dL — ABNORMAL LOW (ref 8.9–10.3)
GFR calc Af Amer: >60 mL/min (ref >60)
GFR calc non Af Amer: >60 mL/min (ref >60)
GLUCOSE: 93 mg/dL (ref 65–99)
POTASSIUM: 4.2 mmol/L (ref 3.5–5.1)
Sodium: 132 mmol/L — ABNORMAL LOW (ref 135–145)

## 2016-05-18 LAB — CBC
HEMATOCRIT: 31.9 % — AB (ref 39.0–52.0)
HEMOGLOBIN: 10.4 g/dL — AB (ref 13.0–17.0)
MCH: 29.6 pg (ref 26.0–34.0)
MCHC: 32.6 g/dL (ref 30.0–36.0)
MCV: 90.9 fL (ref 78.0–100.0)
Platelets: 285 10*3/uL (ref 150–400)
RBC: 3.51 MIL/uL — AB (ref 4.22–5.81)
RDW: 15.7 % — ABNORMAL HIGH (ref 11.5–15.5)
WBC: 7.6 10*3/uL (ref 4.0–10.5)

## 2016-05-18 LAB — LIPID PANEL
CHOL/HDL RATIO: 4.3 ratio
CHOLESTEROL: 81 mg/dL (ref 0–200)
HDL: 19 mg/dL — ABNORMAL LOW (ref >40)
LDL CALC: 51 mg/dL (ref 0–99)
TRIGLYCERIDES: 57 mg/dL (ref <150)
VLDL: 11 mg/dL (ref 0–40)

## 2016-05-18 LAB — MAGNESIUM: MAGNESIUM: 2 mg/dL (ref 1.7–2.4)

## 2016-05-18 MED ORDER — HEPARIN (PORCINE) IN NACL 100-0.45 UNIT/ML-% IJ SOLN
1500.0000 [IU]/h | INTRAMUSCULAR | Status: DC
  Administered 2016-05-18: 1150 [IU]/h via INTRAVENOUS
  Administered 2016-05-19 – 2016-05-21 (×4): 1500 [IU]/h via INTRAVENOUS
  Filled 2016-05-18 (×5): qty 250

## 2016-05-18 MED ORDER — STARCH (THICKENING) PO POWD
ORAL | Status: DC | PRN
  Filled 2016-05-18: qty 227

## 2016-05-18 NOTE — Care Management Note (Signed)
Case Management Note  Patient Details  Name: [name] MRN: [id number] Date of Birth: 10/24/1924  Subjective/Objective:   Pt in with HCAP and CVA.   He is from home with his spouse.                Action/Plan: Recommendation is for SNF. CM following for d/c needs.   Expected Discharge Date:   (unknown)               Expected Discharge Plan:  Skilled Nursing Facility  In-House Referral:  Clinical Social Work  Discharge planning Services  CM Consult  Post Acute Care Choice:    Choice offered to:     DME Arranged:    DME Agency:     HH Arranged:    [place] Agency:     Status of Service:  In process, will continue to follow  If discussed at Long Length of Stay Meetings, dates discussed:    Additional Comments:  Pollie Friar, RN 05/18/2016, 12:12 PM

## 2016-05-18 NOTE — Progress Notes (Signed)
Pharmacy Antibiotic Note  [name] is a 82 y.o. male with PMH myasthenia gravis, CAD who experienced tachycardia and weakness following biopsy of intra-abdominal mass today. Admitted 8/1-8/3 for urinary retention and sent home with Foley cath. Pharmacy has been consulted to dose vancomycin and cefepime for HCAP.  Day # 7 of 8 Vanc and Cefepime for HCAP. Renal function stable. Vanc dose adjusted on 8/12 when trough level was below goal. No repeat trough level done, as antibiotics will stop after 8/16 doses.  Plan:  Continue Vancomycin 1gm IV q12hrs  Continue Cefepime 1gm IV q8hrs.  Antibiotics are scheduled to stop after 8/16 doses.  ______________________  Height: 5\' 10"  (177.8 cm) Weight: 241 lb 8 oz (109.5 kg) IBW/kg (Calculated) : 73  Temp (24hrs), Avg:98.1 F (36.7 C), Min:97.4 F (36.3 C), Max:98.9 F (37.2 C)   Recent Labs Lab 05/12/16 0920 05/12/16 1500 05/13/16 0330 05/14/16 0525 05/15/16 0719 05/18/16 1216  WBC 11.9* 8.8 14.7* 9.0  --  7.6  CREATININE  --  0.69 0.90 0.79  --  0.76  VANCOTROUGH  --   --   --   --  12*  --     Estimated Creatinine Clearance: 79.1 mL/min (by C-G formula based on SCr of 0.8 mg/dL).    Allergies  Allergen Reactions  . Dicyclomine Other (See Comments)    REACTION: can worsen myasthenia gravis    Antimicrobials this admission: cefepime 8/9 >> (8/16) vancomycin 8/9 >> (8/16)  PTA Augmentin for UTI Recent admission from 8/1 to 8/3  Dose adjustments this admission: 8/12 Vanc trough level at 0730 = 12 mcg/ml on 750 mg IV q12hrs    - dose increased to 1gm IV q12hrs  Microbiology results: 8/1 blood x 2 - negative  8/2 urine - negative 8/9 urine Legionella - negative 8/9 urine Strep pneumo - negative 8/15 blood x 2 - sent   Claudie Fisherman Pager: [phone] 05/18/2016 2:46 PM

## 2016-05-18 NOTE — Progress Notes (Signed)
STROKE TEAM PROGRESS NOTE   HISTORY OF PRESENT ILLNESS (per record) This is an 82 year old man who initially presented to him [facility] on 05/12/16 for biopsy of a left upper quadrant abdominal mass. He was taken off of his Plavix for several days prior to his biopsy. CT-guided biopsy was performed as an outpatient procedure. However, following the biopsy, he developed tachycardia with heart rate in the 140s to 150s. EKG was obtained and reportedly showed supraventricular tachycardia. He was transferred to the emergency department for further evaluation. Per ED notes, he was drowsy and slow to arouse but able to answer all questions in the ED. Initial workup included a chest x-ray that showed a possible infiltrate in the left lower lobe. CT of the head was obtained and apparently did not show any acute abnormality. He was given IV fluids and 50 mg of metoprolol with resolution of his tachycardia. He was noted to have an elevated troponin of 0.11. On the admitting hospitalist's exam, the patient was noted to be oriented to self and only partially oriented to place and situation. No focal neurologic abnormalities were observed.  He was admitted to the hospital for further treatment of presumed healthcare-associated pneumonia for which he was placed on cefepime and vancomycin. His Plavix was resumed on 05/13/16. He remained somnolent and encephalopathic, and it was initially assume that this was related to his myasthenia gravis. His Mestinon dose was increased but had no effect on his mental status. Due to persistent lethargy, MRI scan of the brain was ordered for further evaluation. This was completed today and showed scattered areas of acute ischemic infarction. This has prompted transfer to [facility] for stroke workup.  Patient was not administered IV t-PA secondary to unclear LKW non-focal presentation.    SUBJECTIVE (INTERVAL HISTORY) No family at bedside. Pt lying in bed. Disorientated. MRI  showed acute embolic strokes as well as multiple old infarcts. LE venous doppler showed DVT at right LE. Of note, recent abdominal CT showed large RUQ abdominal mass, involving pancreas, spleen, stomach and splenic and portal vein thrombosis.    OBJECTIVE Temp:  [97.4 F (36.3 C)-98.9 F (37.2 C)] 97.8 F (36.6 C) (08/15 1004) Pulse Rate:  [72-81] 78 (08/15 1004) Cardiac Rhythm: Normal sinus rhythm (08/15 0724) Resp:  [16-20] 20 (08/15 1004) BP: (121-154)/(59-69) 121/59 (08/15 1004) SpO2:  [93 %-100 %] 94 % (08/15 1004) Weight:  [109.5 kg (241 lb 8 oz)] 109.5 kg (241 lb 8 oz) (08/14 2047)  CBC:  Recent Labs Lab 05/12/16 1500  05/14/16 0525 05/18/16 1216  WBC 8.8  < > 9.0 7.6  NEUTROABS 7.7  --   --   --   HGB 11.0*  < > 9.9* 10.4*  HCT 32.9*  < > 28.8* 31.9*  MCV 90.4  < > 88.9 90.9  PLT 324  < > 300 285  < > = values in this interval not displayed.  Basic Metabolic Panel:  Recent Labs Lab 05/12/16 1500  05/14/16 0525 05/18/16 1216  NA 137  < > 135 132*  K 4.0  < > 4.1 4.2  CL 105  < > 107 104  CO2 22  < > 24 22  GLUCOSE 97  < > 99 93  BUN 14  < > 22* 14  CREATININE 0.69  < > 0.79 0.76  CALCIUM 8.1*  < > 7.8* 8.1*  MG 1.7  --   --  2.0  < > = values in this interval not displayed.  Lipid Panel:    Component Value Date/Time   CHOL 81 05/18/2016 0747   CHOL 122 10/15/2015 0952   TRIG 57 05/18/2016 0747   HDL 19 (L) 05/18/2016 0747   HDL 37 (L) 10/15/2015 0952   CHOLHDL 4.3 05/18/2016 0747   VLDL 11 05/18/2016 0747   LDLCALC 51 05/18/2016 0747   LDLCALC 61 10/15/2015 0952   HgbA1c: No results found for: HGBA1C Urine Drug Screen: No results found for: LABOPIA, COCAINSCRNUR, LABBENZ, AMPHETMU, THCU, LABBARB    IMAGING I have personally reviewed the radiological images below and agree with the radiology interpretations.  Mr Kizzie Fantasia Contrast 05/17/2016 1. Large acute left PCA infarct with petechial hemorrhage. 2. Small acute embolic infarcts in the  cerebellum and right cerebral hemisphere. 3. Chronic ischemic changes   2D echocardiogram - Left ventricle: The cavity size was normal. Systolic function was normal. The estimated ejection fraction was in the range of 55% to 60%. Wall motion was normal; there were no regional wall motion abnormalities. Doppler parameters are consistent with abnormal left ventricular relaxation (grade 1 diastolic dysfunction). - Aortic root: The aortic root was mildly dilated. - Mitral valve: Mildly calcified annulus. There was mild regurgitation. - Left atrium: The atrium was moderately dilated. - Pulmonary arteries: Systolic pressure was mildly increased. PA peak pressure: 32 mm Hg (S).  CUS - Bilateral: 1-39% ICA stenosis. Vertebral artery flow is antegrade.  LE venous doppler - There is evidence of deep vein thrombosis involving segments of the posterior tibial and peroneal veins of the right lower extremity.  TCD bubble study - pending   PHYSICAL EXAM General - Well nourished, well developed, confused and not orientated  Ophthalmologic - Fundi not visualized due to noncooperation.  Cardiovascular - Regular rate and rhythm.  Neuro - awake but not fully alert, disorientated to place or time or people. Know his last name but not first name or age. Not following commands. Not cooperative on name or repeat. Paucity of speech. Seems to have right hemianopia. LUE drift 4/5 and RUE 5/5 without drift. BLEs withdraw 3/5 on pain stimulation. DTR 1+ and no babinski. Sensation, coordination and gait not tested.   ASSESSMENT/PLAN Mr. TERRAIL ZWEIFEL is a 82 y.o. male with history of recent left upper quadrant abdominal mass biopsy, myasthenia gravis, history of renal cell carcinoma with chronic kidney disease, CAD, HTN, history PE in 2000 found to be somnolent and encephalopathic not related to myasthenia gravis. MRI showed acute embolic infarcts. He did not receive IV t-PA due to unknown last known well and nonfocal  presentation.    Stroke:  Bilateral anterior and posterior infarcts, embolic most likely due to hypercoagulable state secondary to malignancy. Paradoxical emboli with DVT and PFO not ruled out yet.  Resultant  Right homonymous hemianopsia, RUE weakness, disorientation, confusion  MRI  Large left PCA infarct with petechial hemorrhage. Small right cerebellar and right MCA infarcts.  CUS unremarkable  Lower extremity venous Doppler - right LE DVT   TCD bubble study pending  2D Echo  EF 55-60%, no source of embolus  LDL 51, at goal < 70  HgbA1c pending  SCDs and heparin IV for VTE prophylaxis  DIET DYS 3 Room service appropriate? Yes; Fluid consistency: Nectar Thick  clopidogrel 75 mg daily prior to admission, on hold for biopsy, now back on clopidogrel 75 mg daily. Due to right LE DVT, OK to start anticoagulation since the stroke likely started several days ago. Will recommend heparin IV without bolus and  low intensity with level 0.3-0.5. Discontinue plavix.  Patient counseled to be compliant with his antithrombotic medications  Ongoing aggressive stroke risk factor management  Therapy recommendations:  SNF  Disposition:  pending - plan SNF at d/c (lived at home with spouse PTA)  Followed by Dr. Floyde Parkins [facility]  RLE DVT  Likely due to hypercoagulable state secondary to malignancy  Put on heparin IV  Once stable for 2-3 days, consider to transition to lovenox.   Pt had PE in 2000  No need IVC filter now.  LUQ abdominal mass  Needs to follow up with oncology  Biopsy showed poorly differentiated carcinoma - favor recurrent renal carcinoma  Hx of renal carcinoma s/p nephrectomy  Of note, also involved in spleen, pancreas, as well as splenic and portal vein thrombosis  On heparin IV, if stable for 2-3 days will transition to lovenox therapeutic dose.  MG  Followed in [facility] and Dr. Jannifer Franklin  On mestinon and cellcept  PNA  Treatment as per  primary team  On cefepime and vanco  Chest CT no malignancy  Other Stroke Risk Factors  Advanced age  Former Cigarette smoker  Obesity, Body mass index is 34.65 kg/m., recommend weight loss, diet and exercise as appropriate   Coronary artery disease - angioplasty with 2 stents  Other Active Problems  GERD  BPH  Normocytic anemia  Hospital day # 4  Rosalin Hawking, MD PhD Stroke Neurology 05/18/2016 5:20 PM      To contact Stroke Continuity provider, please refer to [email or web address] After hours, contact General Neurology

## 2016-05-18 NOTE — Progress Notes (Signed)
PT Cancellation Note  Patient Details Name: [name] MRN: [id number] DOB: [birth year]   Cancelled Treatment:    Reason Eval/Treat Not Completed: Medical issues which prohibited therapy.  Has new dx DVT and has not been on heparin long enough.  Will see as able 8/16. 05/18/2016  Donnella Sham, [place] [phone]  (pager)   Urian Martenson, Tessie Fass 05/18/2016, 4:54 PM

## 2016-05-18 NOTE — Progress Notes (Signed)
PROGRESS NOTE    [name]  [id number] DOB: 1925/04/07 DOA: 05/12/2016 PCP: [name], MD    Brief Narrative:  [name] is an 82 y.o. male with medical history significant of osteoarthritis, BPH, renal carcinoma, chronic kidney disease, coronary artery disease, diverticulitis, GERD, hypertension, myasthenia gravis, obesity, osteoporosis, recently treated for UTI, who was referred by interventional radiology after he was discovered to be tachycardic with heart rate in the 140s-150s after undergoing a biopsy to evaluate a left upper quadrant mass. In the ED, he was hydrated and given 50 mg of metoprolol with improvement of his heart rate into the 80s. He had mild elevated troponin at 0.11. Chest x-ray showed a left lower lobe infiltrate versus atelectasis.He was admitted and treated for HCAP.  Over the course of his hospitalization, the patient continued to be lethargic and was initially thought to have a myasthenia gravis flare. He did not respond to a increase in his dose of Mestinon and further imaging was therefore obtained with an MRI of his brain which showed a large acute left PCA infarct as well as acute embolic infarcts in the cerebellum and right cerebral hemisphere. Stroke service was consulted and he was transferred to [facility] for further evaluation and recommendations regarding treatment.    Assessment & Plan:   Principal Problem:   HCAP (healthcare-associated pneumonia) Active Problems:   Acute deep vein thrombosis (DVT) of right lower extremity (HCC)   MYASTHENIA GRAVIS W/O (ACUTE) EXACERBATION   Essential hypertension   GERD   BPH (benign prostatic hyperplasia)   Abdominal pain   Abdominal mass, LUQ (left upper quadrant)   Normocytic anemia   CAD (coronary artery disease)   Sinus tachycardia (HCC)   Splenic vein thrombosis   Portal vein thrombosis   Demand ischemia ([place])   Acute thromboembolic cerebrovascular accident (CVA) ([place])   Metastatic cancer  ([place])   Palliative care encounter   DNR (do not resuscitate) discussion   Goals of care, counseling/discussion   Cerebrovascular accident (CVA) due to embolism of cerebral artery ([place])  Healthcare associated pneumonia complicated by sinus tachycardia ([place]) and demand ischemia The patient was admitted to telemetry with improved heart rates after receiving metoprolol. Troponins were elevated but trending down. This is most consistent with demand ischemia in the setting of tachycardia. TSH not elevated. Given his recent hospitalization, the patient was placed on cefepime and vancomycin. It does not appear that blood cultures were sent. Legionella pneumonia and strep pneumonia antigens were negative. With myasthenia gravis -- need to avoid floroquinolones, clinda, aminoglycoside, macrolide, tetra, sulfa, ampicillin, primaxin. When patient is ready to be transitioned to PO abx, will use cefpodoxime or cefuroxime.     Large acute left PCA infarct with petechial hemorrhage/small acute embolic infarcts in the cerebellum and right cerebral hemisphere in the setting of chronic ischemic small vessel cerebrovascular disease Plavix have been held prior to his biopsy, which may have triggered the stroke although in speaking with the patient's wife, he has been lethargic for a couple of weeks. Plavix had been resumed post biopsy on 05/13/16. Neurology consultation requested with plans to transfer the patient to [facility] for evaluation by the stroke service. Stroke appears to be embolic. 2-D echo done on 05/13/16. Patient noted to have a history of renal cell cancer as well as metastatic disease and likely in a hypercoagulable state. Lower extremity Dopplers positive for right lower extremity DVT. Case has been discussed with neurology and patient started on IV heparin at the low dose  of therapeutic range without a bolus. Monitor closely. Neurology following and appreciate input and recommendations.   Right lower extremity  DVT Patient likely in a hypercoagulable state due to history of renal cell cancer metastatic cancer. Patient also noted to have splenic vein and portal vein thrombosis. Patient has been started on IV heparin with goal to be on the lower end of therapeutic range with no bolus. We'll need to monitor closely with acute CVA. Case was discussed with neurology. Case also discussed with Dr. Lindi Adie of hematology/ oncology. If patient remained stable in the next 48 hours may consider transitioning from IV heparin to either full dose Lovenox or Xarelto. Palliative care consultation also pending.  Active Problems:   Persistent lethargy Suspect lethargy now related to acute CVA rather than myasthenia Gravis flare. Will reduce Mestinon dose back to maintenance dose.    Left upper quadrant mass with splenic vein and portal vein thrombosis in a patient with a PMH of renal cancer Dr Rockne Menghini, discussed with Dr. Benay Spice of oncology who did not recommend blood thinners at this time. Biopsy shows poorly differentiated carcinoma, likely from renal cell metastasis given his history of renal cell cancer and papillary features. Given poor prognosis and multiple comorbidities, palliative care consultation has been requested to address CODE STATUS and goals of care. Patient noted to have a right lower extremity DVT, 05/18/2016 likely due to patient's hypercoagulable state of renal cell carcinoma patient will be started on IV heparin at the lower end of therapeutic spectrum, and with no bolus.    MYASTHENIA GRAVIS  Continue Mestinon and CellCept. Mestinon dose reduced to home maintenance dose of 60 mg 4 times a day.    Essential hypertension Continue metoprolol.    GERD Continue Protonix.    BPH (benign prostatic hyperplasia) Continue Flomax.    Abdominal pain/Abdominal mass, LUQ (left upper quadrant) Status post biopsy. Follow-up pathology report.    Normocytic anemia Stable. Monitor hemoglobin.    CAD  (coronary artery disease)  Continue statin and beta blocker.    DVT prophylaxis: Heparin Code Status: Full Family Communication: No family at bedside. Disposition Plan: Pending stroke workup and palliative care consultation with PT OT evaluation.   Consultants:   Neurology: Dr. Shon Hale 05/17/2016    Procedures:   MRI head 05/17/2016  CT head 05/12/2016  CT abdomen and pelvis 05/12/2016  Chest x-ray 05/12/2016  2-D echo 05/13/2016  Lower extremity Dopplers 05/18/2016  Carotid Dopplers 05/18/2016  Antimicrobials:   IV cefepime 05/12/2016  IV vancomycin 05/12/2016   Subjective: Patient lethargic and drowsy. Opens eyes to verbal stimuli. Patient denies any shortness of breath. No chest pain.  Objective: Vitals:   05/18/16 0500 05/18/16 0707 05/18/16 1004 05/18/16 1644  BP: 131/62 137/60 (!) 121/59 125/62  Pulse: 75 78 78 80  Resp: 16 19 20 20   Temp: 98.4 F (36.9 C) 98 F (36.7 C) 97.8 F (36.6 C) 97.7 F (36.5 C)  TempSrc:   Oral Oral  SpO2: 100% 95% 94% 93%  Weight:      Height:        Intake/Output Summary (Last 24 hours) at 05/18/16 1957 Last data filed at 05/18/16 1731  Gross per 24 hour  Intake              240 ml  Output              900 ml  Net             -660 ml  Filed Weights   05/12/16 2131 05/17/16 2047  Weight: 106.5 kg (234 lb 12.6 oz) 109.5 kg (241 lb 8 oz)    Examination:  General exam: Lethargic, drowsy. Opens eyes to verbal stimuli then drifts back off to sleep. Respiratory system: Clear to auscultation anterior lung fields. Respiratory effort normal. Cardiovascular system: S1 & S2 heard, RRR. No JVD, murmurs, rubs, gallops or clicks. No pedal edema. Gastrointestinal system: Abdomen is nondistended, soft and nontender. No organomegaly or masses felt. Normal bowel sounds heard. Central nervous system: Alert and oriented. No focal neurological deficits. Extremities: Unable to assess secondary to patient's mental  status. Skin: No rashes, lesions or ulcers Psychiatry: Unable to assess secondary to patient's mental status.    Data Reviewed: I have personally reviewed following labs and imaging studies  CBC:  Recent Labs Lab 05/12/16 0920 05/12/16 1500 05/13/16 0330 05/14/16 0525 05/18/16 1216  WBC 11.9* 8.8 14.7* 9.0 7.6  NEUTROABS  --  7.7  --   --   --   HGB 11.2* 11.0* 11.0* 9.9* 10.4*  HCT 33.6* 32.9* 32.4* 28.8* 31.9*  MCV 90.1 90.4 90.0 88.9 90.9  PLT 339 324 335 300 [id number]   Basic Metabolic Panel:  Recent Labs Lab 05/12/16 1500 05/13/16 0330 05/14/16 0525 05/18/16 1216  NA 137 136 135 132*  K 4.0 4.1 4.1 4.2  CL 105 107 107 104  CO2 22 23 24 22   GLUCOSE 97 106* 99 93  BUN 14 18 22* 14  CREATININE 0.69 0.90 0.79 0.76  CALCIUM 8.1* 7.8* 7.8* 8.1*  MG 1.7  --   --  2.0   GFR: Estimated Creatinine Clearance: 79.1 mL/min (by C-G formula based on SCr of 0.8 mg/dL). Liver Function Tests:  Recent Labs Lab 05/13/16 0330  AST 52*  ALT 26  ALKPHOS 168*  BILITOT 1.0  PROT 5.6*  ALBUMIN 1.9*   No results for input(s): LIPASE, AMYLASE in the last 168 hours. No results for input(s): AMMONIA in the last 168 hours. Coagulation Profile:  Recent Labs Lab 05/12/16 0920  INR 1.26   Cardiac Enzymes:  Recent Labs Lab 05/12/16 1500 05/12/16 2152 05/13/16 0330  TROPONINI 0.11* 0.10* 0.06*   BNP (last 3 results) No results for input(s): PROBNP in the last 8760 hours. HbA1C: No results for input(s): HGBA1C in the last 72 hours. CBG: No results for input(s): GLUCAP in the last 168 hours. Lipid Profile:  Recent Labs  05/18/16 0747  CHOL 81  HDL 19*  LDLCALC 51  TRIG 57  CHOLHDL 4.3   Thyroid Function Tests: No results for input(s): TSH, T4TOTAL, FREET4, T3FREE, THYROIDAB in the last 72 hours. Anemia Panel: No results for input(s): VITAMINB12, FOLATE, FERRITIN, TIBC, IRON, RETICCTPCT in the last 72 hours. Sepsis Labs: No results for input(s): PROCALCITON,  LATICACIDVEN in the last 168 hours.  Recent Results (from the past 240 hour(s))  Culture, blood (Routine X 2) w Reflex to ID Panel     Status: None (Preliminary result)   Collection Time: 05/18/16  3:26 PM  Result Value Ref Range Status   Specimen Description BLOOD LEFT ANTECUBITAL  Final   Special Requests BOTTLES DRAWN AEROBIC AND ANAEROBIC [facility] EACH  Final   Culture PENDING  Incomplete   Report Status PENDING  Incomplete         Radiology Studies: Mr Jeri Cos Wo Contrast  Result Date: 05/17/2016 CLINICAL DATA:  Slurred speech. EXAM: MRI HEAD WITHOUT AND WITH CONTRAST TECHNIQUE: Multiplanar, multiecho pulse sequences of the brain  and surrounding structures were obtained without and with intravenous contrast. CONTRAST:  13mL MULTIHANCE GADOBENATE DIMEGLUMINE 529 MG/ML IV SOLN COMPARISON:  Head CT 05/12/2016 and MRI 02/04/2004 FINDINGS: The study is moderately motion degraded despite repeated imaging and utilizing faster, more motion resistant protocols. There is a large acute left PCA infarct involving the temporal and occipital lobes. A small amount of confluent petechial hemorrhage is present in the anterior left occipital lobe. Small acute infarcts are present in both cerebral hemispheres, right caudate nucleus, and right frontal, right occipital, and right parietal lobe cortex. There are chronic cortical infarcts involving the right parietal greater than right frontal lobes. Small volume chronic blood products are noted in the right parietal lobe. There are chronic bilateral cerebellar infarcts. T2 hyperintensities elsewhere in the deep cerebral white matter bilaterally are nonspecific but compatible with mild-to-moderate chronic small vessel ischemic disease. There is moderate cerebral atrophy. No mass, midline shift, or extra-axial fluid collection is seen. No abnormal enhancement is identified within limitations of motion. Prior bilateral cataract extraction is noted. Circumferential mucosal  thickening and fluid are present in the left sphenoid sinus. The mastoid air cells are clear. Major intracranial vascular flow voids are grossly preserved with vertebrobasilar and ICA dolichoectasia. IMPRESSION: 1. Large acute left PCA infarct with petechial hemorrhage. 2. Small acute embolic infarcts in the cerebellum and right cerebral hemisphere. 3. Chronic ischemic changes as above. Electronically Signed   By: Logan Bores M.D.   On: 05/17/2016 14:16        Scheduled Meds: . antiseptic oral rinse  7 mL Mouth Rinse BID  . ceFEPime (MAXIPIME) IV  1 g Intravenous Q8H  . famotidine  20 mg Oral Daily  . guaiFENesin-dextromethorphan  10 mL Oral TID  . metoprolol succinate  50 mg Oral Daily  . multivitamin with minerals  1 tablet Oral Daily  . mycophenolate  1,000 mg Oral QHS  . mycophenolate  500 mg Oral Daily  . pyridostigmine  60 mg Oral QID  . sodium chloride flush  3 mL Intravenous Q12H  . tamsulosin  0.4 mg Oral Daily  . vancomycin  1,000 mg Intravenous Q12H   Continuous Infusions: . heparin 1,150 Units/hr (05/18/16 1813)     LOS: 4 days    Time spent: 16 minutes    Shaletha Humble, MD [facility] Pager [phone]  If 7PM-7AM, please contact night-coverage [email or web address] Password [facility] 05/18/2016, 7:57 PM

## 2016-05-18 NOTE — Consult Note (Signed)
Consultation Note Date: 05/18/2016   Patient Name: [name]  DOB: [birth year]  MRN: [id number]  Age / Sex: 82 y.o., male  PCP: [name], MD Referring Physician: Eugenie Filler, MD  Reason for Consultation: Establishing goals of care  HPI/Patient Profile: 82 y.o. male  with past medical history of osteoarthritis, BPH, renal carcinoma, chronic kidney disease, coronary artery disease, diverticulitis, GERD, hypertension, myasthenia gravis, obesity, partial SBO, PE, and recent UTI. He was admitted on 05/12/2016 after developing sinus tachycardia during a LUQ biopsy at interventional radiology at [facility]. He was sent to the [facility] ED for further workup. Admitted for HCAP and started on antibiotics. Patient remained somnolent, therefore Mestinon was increased for his myasthenia gravis. No change in mental status after increase in medication. MRI ordered and results showed large acute left PCA infarct with petechial hemorrhage and small acute embolic infarcts. Patient was transferred to [facility] for stroke workup. Further testing may be necessary including a TEE. Recommending PT/OT/SLP/rehab. Biopsy showed poorly differentiated carcinoma, ? Renal etiology with history of renal carcinoma/left nephrectomy.  Clinical Assessment and Goals of Care: Upon arrival to room, patient was awake and oriented to self. Sister, Adonis Huguenin, at bedside. Patient disoriented to place, time, and situation but can tell me his sister is who is at bedside. Adonis Huguenin explains that Mr. Yokoyama was living at home prior to admission. He was still driving and not using any assisted devices to walk. She states he hasn't been himself for the last few weeks, saying he was very tired and spending most of the day in his chair. Patient's spouse is wheelchair bound therefore difficult for her to visit the hospital. Adonis Huguenin states the youngest, twin  sisters have been visiting frequently and very involved in his care and decision making. Discussed overview of current hospitalization and plan of care with [facility]. Suggested a meeting with her and twin sisters to determine further plan, goals of care, and code status.   I was able to meet with wife, Marcia Brash, and sister, [name], this afternoon. They both have been updated previously on patient status and poor prognosis. Introduced palliative medicine and overview of current status. Discussed probable need for rehab or skilled nursing facility and the two had differing opinions about where they would like the patient to go, but knows he would want a private room if possible. Silvano Bilis agree that he will not be able to go home with his declining functional and mental status. Discussed code status. Patient does not have HCPOA or living will paperwork and was not willing to discuss his wishes with his family. If he was to physically die, wife wishes for resuscitation to be attempted but would not want anything long term, such as a feeding tube. Sister, [name], states that his wife and daughter Coralyn Mark) are to make decisions and need to discuss these matters together.   HCPOA-Wife Marcia Brash) and daughter Coralyn Mark)    SUMMARY OF RECOMMENDATIONS   -Family meeting: Encouraged wife to discuss code status with daughter.  Hard Choices copy given.  -Continue PT/SLP -Consult social work: rehab vs. SNF -Palliative Medicine Team will continue to follow and give recommendations as needed.   Code Status/Advance Care Planning:  Full code-wife does not want anything long term. Encouraged discussion with daughter.    Symptom Management:   Denies pain.   Primary team managing.   Palliative Prophylaxis:   Aspiration, Bowel Regimen, Delirium Protocol and Turn Reposition  Additional Recommendations (Limitations, Scope, Preferences):  Full Scope Treatment  Psycho-social/Spiritual:   Desire for  further Chaplaincy support:no  Additional Recommendations: Caregiving  Support/Resources  Prognosis:   Unable to determine-will know more as hospitalization progresses.   Discharge Planning: To Be Determined Will likely need SNF with palliative services.      Primary Diagnoses: Present on Admission: . Sinus tachycardia ([place]) . Abdominal pain . BPH (benign prostatic hyperplasia) . CAD (coronary artery disease) . Essential hypertension . GERD . MYASTHENIA GRAVIS W/O (ACUTE) EXACERBATION . Normocytic anemia . Abdominal mass, LUQ (left upper quadrant) . HCAP (healthcare-associated pneumonia) . Splenic vein thrombosis . Portal vein thrombosis . Demand ischemia ([place]) . Acute thromboembolic cerebrovascular accident (CVA) ([place]) . Metastatic cancer ([place])   I have reviewed the medical record, interviewed the patient and family, and examined the patient. The following aspects are pertinent.  Past Medical History:  Diagnosis Date  . Arthritis    back, hips, knees   . Benign prostatic hypertrophy   . Cancer [facility])    h/o renal carcinoma   . Chronic kidney disease    h/o UTI-2012  . Coronary artery disease    sees Dr. Shelva Majestic   . Diverticulitis   . GERD (gastroesophageal reflux disease)   . H/O epistaxis   . Heart disease   . Hypertension   . Myasthenia gravis [place])    sees Dr. Jannifer [place]  . Obesity    exogenous  . Osteoporosis   . Partial small bowel obstruction ([place]) 04/16/10   resolved with bowel rest  . Pulmonary embolus ([place])    2000  . Shortness of breath   . Urinary incontinence   . UTI (urinary tract infection)    Social History   Social History  . Marital status: Married    Spouse name: N/A  . Number of children: 3  . Years of education: HS   Occupational History  . Retired [employer] Topics  . Smoking status: Former Smoker    Types: Cigarettes    Quit date: 03/22/1959  . Smokeless tobacco: Never Used      Comment: quit 1970s  . Alcohol use No  . Drug use: No  . Sexual activity: Not Currently   Other Topics Concern  . None   Social History Narrative   Patient is retired   Patient has a high school education       Patient drinks 1-2 cups of caffeine daily.   Patient is right handed.    Family History  Problem Relation Age of Onset  . Colon cancer Mother    Scheduled Meds: . antiseptic oral rinse  7 mL Mouth Rinse BID  . ceFEPime (MAXIPIME) IV  1 g Intravenous Q8H  . clopidogrel  75 mg Oral Daily  . famotidine  20 mg Oral Daily  . guaiFENesin-dextromethorphan  10 mL Oral TID  . metoprolol succinate  50 mg Oral Daily  . multivitamin with minerals  1 tablet Oral Daily  . mycophenolate  1,000 mg Oral QHS  . mycophenolate  500 mg Oral Daily  . pyridostigmine  60 mg Oral QID  . sodium chloride flush  3 mL Intravenous Q12H  . tamsulosin  0.4 mg Oral Daily  . vancomycin  1,000 mg Intravenous Q12H   Continuous Infusions:  PRN Meds:.levalbuterol, polyethylene glycol, RESOURCE THICKENUP CLEAR, senna Medications Prior to Admission:  Prior to Admission medications   Medication Sig Start Date End Date Taking? Authorizing Provider  amoxicillin-clavulanate (AUGMENTIN) 250-125 MG tablet Take 1 tablet by mouth 3 (three) times daily.   Yes Historical Provider, MD  clopidogrel (PLAVIX) 75 MG tablet Take 75 mg by mouth daily.   Yes Historical Provider, MD  metoprolol succinate (TOPROL-XL) 50 MG 24 hr tablet TAKE 1 TABLET ONCE DAILY. 10/24/15  Yes Troy Sine, MD  Multiple Vitamin (MULTIVITAMIN WITH MINERALS) TABS Take 1 tablet by mouth daily.   Yes Historical Provider, MD  mycophenolate (CELLCEPT) 500 MG tablet TAKE 1 TABLET EVERY MORNING AND 2 TABLETS AT BEDTIME. 04/16/16  Yes Kathrynn Ducking, MD  polyethylene glycol [facility] / GLYCOLAX) packet Take 17 g by mouth daily. Patient taking differently: Take 17 g by mouth daily as needed for moderate constipation.  05/06/16  Yes Shanker Kristeen Mans, MD   pyridostigmine (MESTINON) 60 MG tablet TAKE  (1)  TABLET  FOUR TIMES DAILY. Patient taking differently: TAKE  (1)  TABLET TWO  TIMES DAILY. 03/09/16  Yes Kathrynn Ducking, MD  tamsulosin (FLOMAX) 0.4 MG CAPS capsule Take 1 capsule (0.4 mg total) by mouth daily. 05/06/16  Yes Shanker Kristeen Mans, MD  phenazopyridine (PYRIDIUM) 200 MG tablet Take 1 tablet (200 mg total) by mouth 3 (three) times daily. Patient not taking: Reported on 05/12/2016 01/31/16   Melony Overly, MD  rosuvastatin (CRESTOR) 10 MG tablet Take 1 tablet (10 mg total) by mouth daily. Patient not taking: Reported on 05/12/2016 10/07/15   Troy Sine, MD   Allergies  Allergen Reactions  . Dicyclomine Other (See Comments)    REACTION: can worsen myasthenia gravis   Review of Systems  Not able to obtain due to encephalopathy.   Physical Exam  Constitutional: He is cooperative.  Cardiovascular: Normal rate and regular rhythm.   Murmur heard. Pulmonary/Chest: Effort normal. He has decreased breath sounds.  Abdominal: Soft. Normal appearance and bowel sounds are normal. There is no tenderness.  Neurological: He is alert. He is not disoriented. GCS eye subscore is 4. GCS verbal subscore is 4. GCS motor subscore is 6.  Skin: Skin is warm and dry.  Psychiatric: His speech is delayed and slurred. Cognition and memory are impaired.  Nursing note and vitals reviewed.   Vital Signs: BP 137/60 (BP Location: Left Arm)   Pulse 78   Temp 98 F (36.7 C)   Resp 19   Ht 5\' 10"  (1.778 m)   Wt 109.5 kg (241 lb 8 oz)   SpO2 95%   BMI 34.65 kg/m  Pain Assessment: No/denies pain   Pain Score: 0-No pain   SpO2: SpO2: 95 % O2 Device:SpO2: 95 % O2 Flow Rate: .O2 Flow Rate (L/min): 2 L/min  IO: Intake/output summary:  Intake/Output Summary (Last 24 hours) at 05/18/16 0952 Last data filed at 05/18/16 0837  Gross per 24 hour  Intake              270 ml  Output              900 ml  Net             -  630 ml    LBM: Last BM Date:  05/17/16 Baseline Weight: Weight: 106.5 kg (234 lb 12.6 oz) Most recent weight: Weight: 109.5 kg (241 lb 8 oz)     Palliative Assessment/Data: PPS 40%   Flowsheet Rows   Flowsheet Row Most Recent Value  Intake Tab  Referral Department  Hospitalist  Unit at Time of Referral  Cardiac/Telemetry Unit  Palliative Care Primary Diagnosis  Neurology  Date Notified  05/17/16  Palliative Care Type  New Palliative care  Reason for referral  Clarify Goals of Care  Date of Admission  05/12/16  # of days IP prior to Palliative referral  5  Clinical Assessment  Psychosocial & Spiritual Assessment  Palliative Care Outcomes      Time In/Out: 0900-0930, [id number] Time Total: 33min Greater than 50%  of this time was spent counseling and coordinating care related to the above assessment and plan.  Signed by: Basilio Cairo, NP   Please contact Palliative Medicine Team phone at [phone] for questions and concerns.  For individual provider: See Shea Evans

## 2016-05-18 NOTE — Care Management Important Message (Signed)
Important Message  Patient Details  Name: [name] MRN: [id number] Date of Birth: Apr 10, 1925   Medicare Important Message Given:  Yes    Omarian Jaquith Montine Circle 05/18/2016, 10:49 AM

## 2016-05-18 NOTE — Progress Notes (Addendum)
**  Preliminary report by tech**  Carotid duplex completed. Findings are consistent with a 1-39 percent stenosis involving the right internal carotid artery and the left internal carotid artery. The vertebral arteries demonstrate antegrade flow. Limited study due to patient movement and poor cooperation.   Bilateral lower extremity venous duplex completed There is evidence of deep vein thrombosis involving segments of the posterior tibial and peroneal veins of the right lower extremity. No evidence of deep vein or superficial thrombosis involving the left lower extremity. All other visualized vessels appear patent and compressible.  There is no evidence of Baker's cysts bilaterally.  Limited study due to patient movement and poor cooperation.  Results given to the patient's nurse, Heather.  05/18/16 2:49 PM Carlos Levering RVT

## 2016-05-18 NOTE — Progress Notes (Signed)
ANTICOAGULATION CONSULT NOTE - Initial Consult  Pharmacy Consult for heparin Indication: DVT  Allergies  Allergen Reactions  . Dicyclomine Other (See Comments)    REACTION: can worsen myasthenia gravis    Patient Measurements: Height: 5\' 10"  (177.8 cm) Weight: 241 lb 8 oz (109.5 kg) IBW/kg (Calculated) : 73 Heparin Dosing Weight: 95kg  Vital Signs: Temp: 97.8 F (36.6 C) (08/15 1004) Temp Source: Oral (08/15 1004) BP: 121/59 (08/15 1004) Pulse Rate: 78 (08/15 1004)  Labs:  Recent Labs  05/18/16 1216  HGB 10.4*  HCT 31.9*  PLT 285  CREATININE 0.76    Estimated Creatinine Clearance: 79.1 mL/min (by C-G formula based on SCr of 0.8 mg/dL).   Assessment: 85 YOM admitted with new large left PCA infarct with petechial hemorrhage along with small cerebella and right cerebral hemisphere infarcts. Also found to have DVT and to start IV heparin for treatment.  Baseline Hgb 10.4, plts 285- no bleeding noted. He is not on anticoagulation PTA.  Goal of Therapy:  Heparin level 0.3-0.5 units/ml Monitor platelets by anticoagulation protocol: Yes   Plan:  -start heparin with NO BOLUS at rate of 1150 units/hr -heparin level in 8h -daily HL and CBC -follow for long term AC plan  Jaquelyn Sakamoto D. Judine Arciniega, PharmD, BCPS Clinical Pharmacist Pager: [phone] 05/18/2016 4:22 PM

## 2016-05-18 NOTE — Telephone Encounter (Signed)
Attempted to reach Mrs. Colaluca via TC w/o success. No answer and unable to leave VM mssg w/o having a remote access code.   On 05/12/16, pt was transferred to the ER from the radiology department after developing sinus tach in the 140s and 150s during biopsy for LUQ mass w/ interventional radiology. Per hospitalist, the patient did not take his metoprolol that morning and did not sleep much the night before due to anxiety in anticipation of the procedure. He was admitted to the hospital w/  diagnosis of pneumonia.

## 2016-05-18 NOTE — Progress Notes (Signed)
Heparin infusion initiated at 1150units. New IV site placed for heparin dedication to R hand. [name]

## 2016-05-19 ENCOUNTER — Other Ambulatory Visit (HOSPITAL_COMMUNITY): Payer: Medicare Other

## 2016-05-19 DIAGNOSIS — I639 Cerebral infarction, unspecified: Secondary | ICD-10-CM

## 2016-05-19 LAB — COMPREHENSIVE METABOLIC PANEL
ALK PHOS: 175 U/L — AB (ref 38–126)
ALT: 25 U/L (ref 17–63)
AST: 55 U/L — AB (ref 15–41)
Albumin: 1.5 g/dL — ABNORMAL LOW (ref 3.5–5.0)
Anion gap: 6 (ref 5–15)
BUN: 15 mg/dL (ref 6–20)
CALCIUM: 8.2 mg/dL — AB (ref 8.9–10.3)
CO2: 24 mmol/L (ref 22–32)
CREATININE: 0.83 mg/dL (ref 0.61–1.24)
Chloride: 103 mmol/L (ref 101–111)
GFR calc Af Amer: >60 mL/min (ref >60)
GFR calc non Af Amer: >60 mL/min (ref >60)
Glucose, Bld: 105 mg/dL — ABNORMAL HIGH (ref 65–99)
Potassium: 4 mmol/L (ref 3.5–5.1)
SODIUM: 133 mmol/L — AB (ref 135–145)
TOTAL PROTEIN: 5.7 g/dL — AB (ref 6.5–8.1)
Total Bilirubin: 0.8 mg/dL (ref 0.3–1.2)

## 2016-05-19 LAB — HEPARIN LEVEL (UNFRACTIONATED)
HEPARIN UNFRACTIONATED: 0.47 [IU]/mL (ref 0.30–0.70)
Heparin Unfractionated: 0.12 IU/mL — ABNORMAL LOW (ref 0.30–0.70)
Heparin Unfractionated: 0.22 IU/mL — ABNORMAL LOW (ref 0.30–0.70)

## 2016-05-19 LAB — CBC
HCT: 31.8 % — ABNORMAL LOW (ref 39.0–52.0)
HEMOGLOBIN: 10.5 g/dL — AB (ref 13.0–17.0)
MCH: 29.7 pg (ref 26.0–34.0)
MCHC: 33 g/dL (ref 30.0–36.0)
MCV: 90.1 fL (ref 78.0–100.0)
PLATELETS: 313 10*3/uL (ref 150–400)
RBC: 3.53 MIL/uL — AB (ref 4.22–5.81)
RDW: 15.7 % — ABNORMAL HIGH (ref 11.5–15.5)
WBC: 7.6 10*3/uL (ref 4.0–10.5)

## 2016-05-19 LAB — HEMOGLOBIN A1C
HEMOGLOBIN A1C: 5.9 % — AB (ref 4.8–5.6)
Mean Plasma Glucose: 123 mg/dL

## 2016-05-19 NOTE — Progress Notes (Signed)
ANTICOAGULATION CONSULT NOTE - Follow Up Consult  Pharmacy Consult for Heparin  Indication: DVT   Allergies  Allergen Reactions  . Dicyclomine Other (See Comments)    REACTION: can worsen myasthenia gravis    Patient Measurements: Height: 5\' 10"  (177.8 cm) Weight: 241 lb 8 oz (109.5 kg) IBW/kg (Calculated) : 73  Heparin Dosing Weight: 97 kg  Vital Signs: Temp: 98.8 F (37.1 C) (08/16 1005) Temp Source: Oral (08/16 1005) BP: 136/65 (08/16 1005) Pulse Rate: 88 (08/16 1005)  Labs:  Recent Labs  05/18/16 1216 05/19/16 0027 05/19/16 1000  HGB 10.4* 10.5*  --   HCT 31.9* 31.8*  --   PLT 285 313  --   HEPARINUNFRC  --  0.12* 0.22*  CREATININE 0.76 0.83  --     Estimated Creatinine Clearance: 76.2 mL/min (by C-G formula based on SCr of 0.83 mg/dL).  Assessment: New DVT in setting of acute stroke, initial heparin level is sub-therapeutic, no issues per RN.   Repeat HL remains subtherapeutic at 0.22 on heparin 1300 units/hr. Nurse reports no issues with infusion or bleeding.  Goal of Therapy:  Heparin level 0.3-0.5 units/ml, in setting of acute stroke Monitor platelets by anticoagulation protocol: Yes   Plan:  Increase heparin to 1500 units/hr 8h HL Daily HL Monitor s/sx of bleeding  [name]. Diona Foley, PharmD, BCPS Clinical Pharmacist Pager [phone] 05/19/2016,11:07 AM

## 2016-05-19 NOTE — Progress Notes (Signed)
Daily Progress Note   Patient Name: [name]       Date: 05/19/2016 DOB: [birth year]  Age: 82 y.o. MRN#: [id number] Attending Physician: Gerald Hockey, MD Primary Care Physician: Gerald Morale, MD Admit Date: 05/12/2016  Reason for Consultation/Follow-up: Establishing goals of care  Subjective: [name] is lying in bed, awake upon arrival to room. He is disoriented to place, time, and situation. He does seem more alert and answering more questions then yesterday. The patient cannot remember why he was brought to the hospital. Denies pain or discomfort. Nurse states he has eaten today.   Updated wife, [name], via telephone. She asks for her two daughter's numbers to be added to the chart. She did not discuss his wishes with them but states they are aware that he would not want to be ventilated or have a feeding tube. [name] states they would want resuscitation to be attempted but would not want anything long term. She is thankful for the providers that have been communicating with and updating her.      Length of Stay: 5  Current Medications: Scheduled Meds:  . antiseptic oral rinse  7 mL Mouth Rinse BID  . ceFEPime (MAXIPIME) IV  1 g Intravenous Q8H  . famotidine  20 mg Oral Daily  . guaiFENesin-dextromethorphan  10 mL Oral TID  . metoprolol succinate  50 mg Oral Daily  . multivitamin with minerals  1 tablet Oral Daily  . mycophenolate  1,000 mg Oral QHS  . mycophenolate  500 mg Oral Daily  . pyridostigmine  60 mg Oral QID  . sodium chloride flush  3 mL Intravenous Q12H  . tamsulosin  0.4 mg Oral Daily    Continuous Infusions: . heparin 1,500 Units/hr (05/19/16 1124)    PRN Meds: food thickener, levalbuterol, polyethylene glycol, RESOURCE THICKENUP CLEAR,  senna  Physical Exam  Constitutional: He is cooperative.  Cardiovascular: Regular rhythm.   Pulmonary/Chest: Effort normal. No tachypnea. No respiratory distress. He has decreased breath sounds.  Abdominal: Soft. Bowel sounds are normal. There is no tenderness.  Musculoskeletal: He exhibits edema.  Neurological: He is alert. He is disoriented. GCS eye subscore is 4. GCS verbal subscore is 4. GCS motor subscore is 6.  Skin: Skin is warm and dry.  Psychiatric: Cognition and memory are impaired.  Nursing note and  vitals reviewed.           Vital Signs: BP 128/62 (BP Location: Left Arm)   Pulse 87   Temp 98.1 F (36.7 C) (Oral)   Resp 20   Ht 5\' 10"  (1.778 m)   Wt 109.5 kg (241 lb 8 oz)   SpO2 95%   BMI 34.65 kg/m  SpO2: SpO2: 95 % O2 Device: O2 Device: Not Delivered O2 Flow Rate: O2 Flow Rate (L/min): 2 L/min  Intake/output summary:  Intake/Output Summary (Last 24 hours) at 05/19/16 1400 Last data filed at 05/19/16 1246  Gross per 24 hour  Intake          1663.56 ml  Output              700 ml  Net           963.56 ml   LBM: Last BM Date: 05/17/16 Baseline Weight: Weight: 106.5 kg (234 lb 12.6 oz) Most recent weight: Weight: 109.5 kg (241 lb 8 oz)       Palliative Assessment/Data: PPS 40%   Flowsheet Rows   Flowsheet Row Most Recent Value  Intake Tab  Referral Department  Hospitalist  Unit at Time of Referral  Cardiac/Telemetry Unit  Palliative Care Primary Diagnosis  Neurology  Date Notified  05/17/16  Palliative Care Type  New Palliative care  Reason for referral  Clarify Goals of Care  Date of Admission  05/12/16  Date first seen by Palliative Care  05/18/16  # of days Palliative referral response time  1 Day(s)  # of days IP prior to Palliative referral  5  Clinical Assessment  Palliative Performance Scale Score  40%  Psychosocial & Spiritual Assessment  Social Work Plan of Care  Clarified patient/family wishes with healthcare team, Advance care planning   Palliative Care Outcomes  Patient/Family meeting held?  Yes  Who was at the meeting?  Sister [name]) and wife [name])  Palliative Care Outcomes  Clarified goals of care, Provided advance care planning, Provided psychosocial or spiritual support  Patient/Family wishes: Interventions discontinued/not started   Tube feedings/TPN, PEG  Palliative Care follow-up planned  Yes, Facility      Patient Active Problem List   Diagnosis Date Noted  . Acute deep vein thrombosis (DVT) of right lower extremity ([place]) 05/18/2016  . Palliative care encounter   . DNR (do not resuscitate) discussion   . Goals of care, counseling/discussion   . Cerebrovascular accident (CVA) due to embolism of cerebral artery ([place])   . Acute thromboembolic cerebrovascular accident (CVA) ([place]) 05/17/2016  . Metastatic cancer ([place]) 05/17/2016  . Demand ischemia ([place]) 05/14/2016  . HCAP (healthcare-associated pneumonia) 05/13/2016  . 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 Portal vein thrombosis 05/13/2016  . Sinus tachycardia ([place]) 05/12/2016  . Abdominal pain 05/04/2016  . GI bleed 05/04/2016  . Abdominal mass, LUQ (left upper quadrant) 05/04/2016  . Normocytic anemia 05/04/2016  . CAD (coronary artery disease) 05/04/2016  . SIRS (systemic inflammatory response syndrome) ([place]) 05/04/2016  . Failure to thrive in adult 05/04/2016  . Urinary retention   . Pancreatic lesion 04/20/2016  . Recurrent UTI 04/20/2016  . First degree heart block 10/08/2015  . Environmental allergies 09/03/2013  . Hyperlipidemia with target LDL less than 70 07/25/2013  . Hx of unilateral nephrectomy 07/25/2013  . Failed total knee, right ([place]) 03/31/2012  . ABDOMINAL PAIN, ACUTE 04/16/2010  . ABDOMINAL PAIN, LOWER 04/16/2010  . COUGH 12/11/2009  . VIRAL GASTROENTERITIS 10/27/2009  . SKIN LESIONS,  MULTIPLE 10/27/2009  . DEGENERATIVE JOINT DISEASE 10/27/2009  . RIB PAIN, RIGHT SIDED 08/13/2009  . TINEA CRURIS 06/11/2009  . UNSPECIFIED  HEARING LOSS 06/11/2009  . LIPOMA 10/26/2007  . LEG CRAMPS 10/26/2007  . BPH (benign prostatic hyperplasia) 07/18/2007  . Personal history of renal cell carcinoma 03/27/2007  . MYASTHENIA GRAVIS W/O (ACUTE) EXACERBATION 03/27/2007  . Essential hypertension 03/27/2007  . Coronary atherosclerosis 03/27/2007  . GERD 03/27/2007  . COLONIC POLYPS, BENIGN, HX OF 03/27/2007  . DIVERTICULITIS, HX OF 03/27/2007    Palliative Care Assessment & Plan   Patient Profile: 82 y.o. male  with past medical history of osteoarthritis, BPH, renal carcinoma, chronic kidney disease, coronary artery disease, diverticulitis, GERD, hypertension, myasthenia gravis, obesity, partial SBO, PE, and recent UTI. He was admitted on 05/12/2016 after developing sinus tachycardia during a LUQ biopsy at interventional radiology at [facility]. He was sent to the [facility] ED for further workup. Admitted for HCAP and started on antibiotics. Patient remained somnolent, therefore Mestinon was increased for his myasthenia gravis. No change in mental status after increase in medication. MRI ordered and results showed large acute left PCA infarct with petechial hemorrhage and small acute embolic infarcts. Patient was transferred to [facility] for stroke workup. Echocardiogram, carotid ultrasound, and BLE venous dopplers completed. The patient is positive for a RLE DVT and started on heparin. Biopsy showed poorly differentiated carcinoma, ? Renal etiology with history of renal carcinoma/left nephrectomy. Neuro recommending SNF and have signed off.  Assessment: Awake and alert, not oriented. Appears comfortable lying in the bed.   Recommendations/Plan:  Palliative Medicine Team will continue to follow and give recommendations as needed.  Continue communication with wife regarding plan and code status.   Continue PT/SLP.  Unsure of disposition upon discharge. Most likely SNF with palliative services or if patient declines, hospice  should be considered.   Goals of Care and Additional Recommendations:  Limitations on Scope of Treatment: Full Scope Treatment  Code Status: FULL   Code Status Orders        Start     Ordered   05/12/16 2135  Full code  Continuous     05/12/16 2134    Code Status History    Date Active Date Inactive Code Status Order ID Comments User Context   05/04/2016  8:06 PM 05/06/2016  2:37 PM Full Code [id number]  Vianne Bulls, MD ED       Prognosis:   Unable to determine-will continue to follow patient during hospitalization.   Discharge Planning:  To Be Determined-Rehab vs. SNF with palliative services or hospice.   Care plan was discussed with RN, patient, and wife.   Thank you for allowing the Palliative Medicine Team to assist in the care of this patient.   Time In: 1320 Time Out: 1400 Total Time 30min Prolonged Time Billed  no       Greater than 50%  of this time was spent counseling and coordinating care related to the above assessment and plan.  Basilio Cairo, NP  Please contact Palliative Medicine Team phone at [phone] for questions and concerns.

## 2016-05-19 NOTE — Telephone Encounter (Signed)
Pt's wife returned Jennifer's call. She said she would be at home most of the day today

## 2016-05-19 NOTE — Progress Notes (Signed)
Speech Language Pathology Treatment: Dysphagia  Patient Details Name: [name] MRN: [id number] DOB: [birth year] Today's Date: 05/19/2016 Time: [id number] SLP Time Calculation (min) (ACUTE ONLY): 13 min  Assessment / Plan / Recommendation Clinical Impression  Pt accepted only thin water from this SlP this am.  NT reports good tolerance of meal this am with no coughing or difficulties.  Water consumption via cup revealed no indication of airway compromise however pt does report h/o coughing with liquids prior to admission and with ? left lobe pna, myasthenia gravis and cva, he may benefit from consideration for MBS prior to dietary advancement.    Recommend continue dys3/nectar and allow thin water for comfort- consider MBS next date 05/20/16.  Intake listed as 25% and pt is on room air with afebrile status.  Educated pt to recommendations.    HPI HPI: 82 yo male adm to [facility] with weakness - recent biopsy for LUQ mass - pt was diagnosed with HCAP.  PMH + for GERD, renal cancer, Myasthenia Gravis on Mestinon.  CT head negative.  CXR left lower lobe opacity.  Swallow evaluation ordered.         SLP Plan  Continue with current plan of care (rec MBS next date if md approves)     Recommendations  Diet recommendations: Dysphagia 3 (mechanical soft);Nectar-thick liquid;Other(comment) (thin water ok) Medication Administration: Whole meds with puree Supervision: Full supervision/cueing for compensatory strategies Compensations: Slow rate;Small sips/bites Postural Changes and/or Swallow Maneuvers: Seated upright 90 degrees;Upright 30-60 min after meal             Oral Care Recommendations: Oral care BID Follow up Recommendations:  (tbd) Plan: Continue with current plan of care (rec MBS next date if md approves)     [place], [place] [facility] SLP [phone]

## 2016-05-19 NOTE — Progress Notes (Signed)
ANTICOAGULATION CONSULT NOTE - Follow Up Consult  Pharmacy Consult for Heparin  Indication: DVT   Allergies  Allergen Reactions  . Dicyclomine Other (See Comments)    REACTION: can worsen myasthenia gravis    Patient Measurements: Height: 5\' 10"  (177.8 cm) Weight: 241 lb 8 oz (109.5 kg) IBW/kg (Calculated) : 73  Heparin Dosing Weight: 97 kg  Vital Signs: Temp: 98 F (36.7 C) (08/16 1706) Temp Source: Oral (08/16 1706) BP: 148/71 (08/16 1706) Pulse Rate: 91 (08/16 1706)  Labs:  Recent Labs  05/18/16 1216 05/19/16 0027 05/19/16 1000 05/19/16 1903  HGB 10.4* 10.5*  --   --   HCT 31.9* 31.8*  --   --   PLT 285 313  --   --   HEPARINUNFRC  --  0.12* 0.22* 0.47  CREATININE 0.76 0.83  --   --     Estimated Creatinine Clearance: 76.2 mL/min (by C-G formula based on SCr of 0.83 mg/dL).  Assessment: New DVT in setting of acute stroke, initial heparin level is sub-therapeutic, no issues per RN.   PM heparin level therapeutic at 0.47  Goal of Therapy:  Heparin level 0.3-0.5 units/ml, in setting of acute stroke Monitor platelets by anticoagulation protocol: Yes   Plan:  Continue heparin at 1500 units / hr Next lab in AM  Thank you Anette Guarneri, PharmD [phone] 05/19/2016,7:37 PM

## 2016-05-19 NOTE — Telephone Encounter (Signed)
Mrs. Doose called back. Says that her her husband started to have a little more slurring of speech about 3 weeks ago which she just r/t his myasthenia gravis. Pt then had some difficulty w/ urinary retention/constipation and his she sent him to the ER at the beginning of this month. Last week, pt was admitted for CAP. Wife reports that he has since had an MRI that showed a small stroke and some other things. She's requesting that Dr. Jannifer Franklin give a 2nd neurological opinion. Let her know that he does not see patients in the hospital but could continue to follow patient after discharge. Wife asks that Dr. Jannifer Franklin take a look at his MRI results and call her back if possible.

## 2016-05-19 NOTE — Progress Notes (Signed)
STROKE TEAM PROGRESS NOTE   SUBJECTIVE (INTERVAL HISTORY) His family are at the bedside. He is stitting up in the bed. Nurse is helping him eat. On heparin drip now. Palliative to talk with family.     OBJECTIVE Temp:  [97.7 F (36.5 C)-98.8 F (37.1 C)] 98.8 F (37.1 C) (08/16 1005) Pulse Rate:  [80-88] 88 (08/16 1005) Cardiac Rhythm: Normal sinus rhythm (08/15 2011) Resp:  [18-20] 20 (08/16 1005) BP: (125-136)/(61-68) 136/65 (08/16 1005) SpO2:  [91 %-95 %] 91 % (08/16 1005)  CBC:  Recent Labs Lab 05/12/16 1500  05/18/16 1216 05/19/16 0027  WBC 8.8  < > 7.6 7.6  NEUTROABS 7.7  --   --   --   HGB 11.0*  < > 10.4* 10.5*  HCT 32.9*  < > 31.9* 31.8*  MCV 90.4  < > 90.9 90.1  PLT 324  < > 285 313  < > = values in this interval not displayed.  Basic Metabolic Panel:  Recent Labs Lab 05/12/16 1500  05/18/16 1216 05/19/16 0027  NA 137  < > 132* 133*  K 4.0  < > 4.2 4.0  CL 105  < > 104 103  CO2 22  < > 22 24  GLUCOSE 97  < > 93 105*  BUN 14  < > 14 15  CREATININE 0.69  < > 0.76 0.83  CALCIUM 8.1*  < > 8.1* 8.2*  MG 1.7  --  2.0  --   < > = values in this interval not displayed.  Lipid Panel:     Component Value Date/Time   CHOL 81 05/18/2016 0747   CHOL 122 10/15/2015 0952   TRIG 57 05/18/2016 0747   HDL 19 (L) 05/18/2016 0747   HDL 37 (L) 10/15/2015 0952   CHOLHDL 4.3 05/18/2016 0747   VLDL 11 05/18/2016 0747   LDLCALC 51 05/18/2016 0747   LDLCALC 61 10/15/2015 0952   HgbA1c:  Lab Results  Component Value Date   HGBA1C 5.9 (H) 05/18/2016    IMAGING I have personally reviewed the radiological images below and agree with the radiology interpretations.  Mr [name] Contrast 05/17/2016 1. Large acute left PCA infarct with petechial hemorrhage. 2. Small acute embolic infarcts in the cerebellum and right cerebral hemisphere. 3. Chronic ischemic changes   2D echocardiogram - Left ventricle: The cavity size was normal. Systolic function was normal. The  estimated ejection fraction was in the range of 55% to 60%. Wall motion was normal; there were no regional wall motion abnormalities. Doppler parameters are consistent with abnormal left ventricular relaxation (grade 1 diastolic dysfunction). - Aortic root: The aortic root was mildly dilated. - Mitral valve: Mildly calcified annulus. There was mild regurgitation. - Left atrium: The atrium was moderately dilated. - Pulmonary arteries: Systolic pressure was mildly increased. PA peak pressure: 32 mm Hg (S).  CUS - Bilateral: 1-39% ICA stenosis. Vertebral artery flow is antegrade.  LE venous doppler - There is evidence of deep vein thrombosis involving segments of the posterior tibial and peroneal veins of the right lower extremity.   PHYSICAL EXAM General - Well nourished, well developed, confused and not orientated  Ophthalmologic - Fundi not visualized due to noncooperation.  Cardiovascular - Regular rate and rhythm.  Neuro - awake but not fully alert, disorientated to place or time or people. Know his last name but not first name or age. Not following commands. Not cooperative on name or repeat. Paucity of speech. Seems to have right  hemianopia. LUE drift 4/5 and RUE 5/5 without drift. BLEs withdraw 3/5 on pain stimulation. DTR 1+ and no babinski. Sensation, coordination and gait not tested.   ASSESSMENT/PLAN [name] is a 82 y.o. male with history of recent left upper quadrant abdominal mass biopsy, myasthenia gravis, history of renal cell carcinoma with chronic kidney disease, CAD, HTN, history PE in 2000 found to be somnolent and encephalopathic not related to myasthenia gravis. MRI showed acute embolic infarcts. He did not receive IV t-PA due to unknown last known well and nonfocal presentation.    Stroke:  Bilateral anterior and posterior infarcts, embolic most likely due to hypercoagulable state secondary to malignancy. Paradoxical emboli with DVT and PFO also a  possibility.  Resultant  Right homonymous hemianopsia, RUE weakness, disorientation, confusion  MRI  Large left PCA infarct with petechial hemorrhage. Small right cerebellar and right MCA infarcts.  CUS unremarkable  Lower extremity venous Doppler - right LE DVT   TCD bubble study - cancelled. Patient condition does not endorse   2D Echo  EF 55-60%, no source of embolus  LDL 51  HgbA1c 5.9  SCDs and heparin IV for VTE prophylaxis DIET DYS 3 Room service appropriate? Yes; Fluid consistency: Nectar Thick  clopidogrel 75 mg daily prior to admission, on hold for biopsy, now on heparin IV. due to right LE DVT,  Discontinued plavix. Transition to lovenox therapeutic dose in 1-2 days if pt tolerating heparin IV.   Ongoing aggressive stroke risk factor management  Therapy recommendations:  SNF  Disposition:  pending - plan SNF at d/c (lived at home with spouse PTA)  Followed by Dr. Floyde Parkins [facility]  RLE DVT  Likely due to hypercoagulable state secondary to malignancy  Put on heparin IV  Once stable for 1-2 days, consider to transition to lovenox therapeutic dose.   Pt had PE in 2000  No need IVC filter now.  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  MG  Followed in [facility] and Dr. Jannifer Franklin  On mestinon and cellcept  PNA  Treatment as per primary team  On cefepime and vanco  Chest CT no malignancy  Other Stroke Risk Factors  Advanced age  Former Cigarette smoker  Obesity, Body mass index is 34.65 kg/m., recommend weight loss, diet and exercise as appropriate   Coronary artery disease - angioplasty with 2 stents  Other Active  Problems  GERD  BPH  Normocytic anemia  Dr. Erlinda Hong discussed with Dr. Denton Brick diagnosis and plan of care.  Stroke Team will sign off Follow up with Dr. Floyde Parkins, order placed.   Hospital day # 5  Rosalin Hawking, MD PhD Stroke Neurology 05/19/2016 7:08 PM    To contact Stroke Continuity provider, please refer to [email or web address] After hours, contact General Neurology

## 2016-05-19 NOTE — Progress Notes (Signed)
PT Cancellation Note  Patient Details Name: KYSEAN WITMER MRN: [id number] DOB: 1925-04-12   Cancelled Treatment:    Reason Eval/Treat Not Completed: Medical issues which prohibited therapy.  Pt not at therapeutic levels yet. 05/19/2016  Donnella Sham, [place] [phone]  (pager)   Jackquelyn Sundberg, Tessie Fass 05/19/2016, 4:12 PM

## 2016-05-19 NOTE — Progress Notes (Signed)
ANTICOAGULATION CONSULT NOTE - Follow Up Consult  Pharmacy Consult for Heparin  Indication: DVT   Allergies  Allergen Reactions  . Dicyclomine Other (See Comments)    REACTION: can worsen myasthenia gravis    Patient Measurements: Height: 5\' 10"  (177.8 cm) Weight: 241 lb 8 oz (109.5 kg) IBW/kg (Calculated) : 73  Vital Signs: Temp: 98.7 F (37.1 C) (08/15 2144) Temp Source: Oral (08/15 2144) BP: 132/61 (08/15 2144) Pulse Rate: 82 (08/15 2144)  Labs:  Recent Labs  05/18/16 1216 05/19/16 0027  HGB 10.4* 10.5*  HCT 31.9* 31.8*  PLT 285 313  HEPARINUNFRC  --  0.12*  CREATININE 0.76 0.83    Estimated Creatinine Clearance: 76.2 mL/min (by C-G formula based on SCr of 0.83 mg/dL).  Assessment: New DVT in setting of acute stroke, initial heparin level is sub-therapeutic, no issues per RN.   Goal of Therapy:  Heparin level 0.3-0.5 units/ml, in setting of acute stroke Monitor platelets by anticoagulation protocol: Yes   Plan:  -NO BOLUS -Inc heparin to 1300 units/hr -0900 HL  Shantanique Hodo 05/19/2016,1:15 AM

## 2016-05-19 NOTE — Telephone Encounter (Signed)
I tried to call, unable to leave a message, I will try to call back tomorrow.  The patient has had a right cerebellar, left posterior cerebral and some right hemisphere infarcts, may of had a cardiogenic stroke event, may have a hypercoagulable state associated with cancer.

## 2016-05-19 NOTE — Telephone Encounter (Signed)
Tried again to call pt's wife back. No answer and unable to leave mssg at either number.   [name] is still an inpatient @ [facility] after he was "found to be somnolent and encephalopathic not related to myasthenia gravis. MRI showed acute embolic infarcts" per hospital neurology. Palliative care also following patient w/ "prognosis and discharge to be determined - rehab vs. SNF w/ palliative services or hospice."  Will continue to offer support to wife and follow pt after discharge.

## 2016-05-19 NOTE — Progress Notes (Signed)
Patient Demographics:    [name], is a 82 y.o. male, DOB - October 12, 1924, [id number]  Admit date - 05/12/2016   Admitting Physician Reubin Milan, MD  Outpatient Primary MD for the patient is [name], MD  LOS - 5   Chief Complaint  Patient presents with  . Tachycardia        Subjective:    [name] today has no fevers, no emesis,  No chest pain,  With right leg pain,, patient's sister brother and brother-in-law at bedside, questions answered   Assessment  & Plan :    Principal Problem:   HCAP (healthcare-associated pneumonia) Active Problems:   MYASTHENIA GRAVIS W/O (ACUTE) EXACERBATION   Essential hypertension   GERD   BPH (benign prostatic hyperplasia)   Abdominal pain   Abdominal mass, LUQ (left upper quadrant)   Normocytic anemia   CAD (coronary artery disease)   Sinus tachycardia (HCC)   Splenic vein thrombosis   Portal vein thrombosis   Demand ischemia ([place])   Acute thromboembolic cerebrovascular accident (CVA) ([place])   Metastatic cancer ([place])   Palliative care encounter   DNR (do not resuscitate) discussion   Goals of care, counseling/discussion   Cerebrovascular accident (CVA) due to embolism of cerebral artery ([place])   Acute deep vein thrombosis (DVT) of right lower extremity (HCC)   1)Acute strokes in both cerebral hemispheres and right cerebellar hemisphere- ??? Hypercoagulable state secondary to underlying malignancy with possible intracardiac source, neurology service has signed off. Continue IV heparin for DVT  2)Rt LE DVT- continue IV heparin, Discussed with consider transitioning to Lovenox in 2-3 days  3) intra-abdominal malignancy-axis post previous left nephrectomy for malignancy,, ??? If recurrence, final pathology reports pending  4)Other- palliative care consult appreciated to help determining goals of care and CODE STATUS  5)Myasthenia  Gravis- Mestinon increased due to generalized weakness  Code Status : Full code   Disposition Plan  : Most likely would need rehabilitation  Consults  :  Palliative care, neurology   DVT Prophylaxis  :  iv Heparin   Lab Results  Component Value Date   PLT 313 05/19/2016    Inpatient Medications  Scheduled Meds: . antiseptic oral rinse  7 mL Mouth Rinse BID  . ceFEPime (MAXIPIME) IV  1 g Intravenous Q8H  . famotidine  20 mg Oral Daily  . guaiFENesin-dextromethorphan  10 mL Oral TID  . metoprolol succinate  50 mg Oral Daily  . multivitamin with minerals  1 tablet Oral Daily  . mycophenolate  1,000 mg Oral QHS  . mycophenolate  500 mg Oral Daily  . pyridostigmine  60 mg Oral QID  . sodium chloride flush  3 mL Intravenous Q12H  . tamsulosin  0.4 mg Oral Daily   Continuous Infusions: . heparin 1,500 Units/hr (05/19/16 1400)   PRN Meds:.food thickener, levalbuterol, polyethylene glycol, RESOURCE THICKENUP CLEAR, senna    Anti-infectives    Start     Dose/Rate Route Frequency Ordered Stop   05/15/16 0900  vancomycin (VANCOCIN) IVPB 1000 mg/200 mL premix     1,000 mg 200 mL/hr over 60 Minutes Intravenous Every 12 hours 05/15/16 0818 05/19/16 1224   05/13/16 0800  vancomycin (VANCOCIN) IVPB 750 mg/150 ml premix  Status:  Discontinued  750 mg 150 mL/hr over 60 Minutes Intravenous Every 12 hours 05/12/16 2208 05/15/16 0818   05/13/16 0600  ceFEPIme (MAXIPIME) 1 g in dextrose 5 % 50 mL IVPB     1 g 100 mL/hr over 30 Minutes Intravenous Every 8 hours 05/12/16 2134 05/19/16 2359   05/12/16 2300  vancomycin (VANCOCIN) 2,000 mg in sodium chloride 0.9 % 500 mL IVPB     2,000 mg 250 mL/hr over 120 Minutes Intravenous  Once 05/12/16 2208 05/13/16 0230   05/12/16 2230  ceFEPIme (MAXIPIME) 2 g in dextrose 5 % 50 mL IVPB     2 g 100 mL/hr over 30 Minutes Intravenous  Once 05/12/16 2208 05/13/16 0003   05/12/16 2200  amoxicillin-clavulanate (AUGMENTIN) 250-125 MG per tablet 1  tablet  Status:  Discontinued     1 tablet Oral 3 times daily 05/12/16 2134 05/12/16 2206        Objective:   Vitals:   05/19/16 0500 05/19/16 1005 05/19/16 1333 05/19/16 1706  BP: 135/68 136/65 128/62 (!) 148/71  Pulse:  88 87 91  Resp: 18 20 20 20   Temp: 98.8 F (37.1 C) 98.8 F (37.1 C) 98.1 F (36.7 C) 98 F (36.7 C)  TempSrc: Oral Oral Oral Oral  SpO2: 95% 91% 95% 92%  Weight:      Height:        Wt Readings from Last 3 Encounters:  05/17/16 109.5 kg (241 lb 8 oz)  05/12/16 106.2 kg (234 lb 3.2 oz)  05/06/16 106.2 kg (234 lb 3.2 oz)     Intake/Output Summary (Last 24 hours) at 05/19/16 1726 Last data filed at 05/19/16 1246  Gross per 24 hour  Intake          1663.56 ml  Output              700 ml  Net           963.56 ml     Physical Exam  Gen:- Awake Alert,   HEENT:- [place].AT, No sclera icterus Neck-Supple Neck,No JVD,.  Lungs-  CTAB  CV- S1, S2 normal Abd-  +ve B.Sounds, Abd Soft, No tenderness,    Extremity/Skin:- Right lower estimate is swelling and discomfort Neuro- paresis of both lower extremities  Data Review:   Micro Results Recent Results (from the past 240 hour(s))  Culture, blood (Routine X 2) w Reflex to ID Panel     Status: None (Preliminary result)   Collection Time: 05/18/16 12:30 PM  Result Value Ref Range Status   Specimen Description BLOOD LEFT ARM  Final   Special Requests IN PEDIATRIC BOTTLE 2 CC  Final   Culture NO GROWTH 1 DAY  Final   Report Status PENDING  Incomplete  Culture, blood (Routine X 2) w Reflex to ID Panel     Status: None (Preliminary result)   Collection Time: 05/18/16  3:26 PM  Result Value Ref Range Status   Specimen Description BLOOD LEFT ANTECUBITAL  Final   Special Requests BOTTLES DRAWN AEROBIC AND ANAEROBIC 5CC EACH  Final   Culture NO GROWTH < 24 HOURS  Final   Report Status PENDING  Incomplete    Radiology Reports Ct Abdomen Pelvis Wo Contrast  Result Date: 05/04/2016 CLINICAL DATA:  Constipation  for 1 day, difficulty urinating for 1 day, abdominal pain, history GERD, BPH, GERD, small-bowel obstruction, hypertension, pulmonary embolism, myasthenia gravis, renal cell carcinoma, coronary artery disease, former smoker EXAM: CT ABDOMEN AND PELVIS WITHOUT CONTRAST TECHNIQUE: Multidetector CT imaging  of the abdomen and pelvis was performed following the standard protocol without IV contrast. Sagittal and coronal MPR images reconstructed from axial data set. Neither oral nor intravenous contrast were administered for this study. COMPARISON:  CT abdomen 03/03/2016 FINDINGS: Lower chest: Bibasilar atelectasis, increased. Peribronchial thickening particularly LEFT lower lobe. Mild interstitial prominence at lung bases little changed. Hepatobiliary: Minimal gallbladder wall calcification. Gallstone seen previously not definitely visualized on current study. No definite focal hepatic lesions. Pancreas: Slight enlargement of the pancreatic tail with ductal dilatation seen on previous exam less well visualized due the lack of IV contrast. Pancreatic appearance little changed. Spleen: No definite intra splenic abnormalities Adrenals/Urinary Tract: Normal-appearing RIGHT adrenal gland. LEFT adrenal gland not visualized. Normal RIGHT kidney and RIGHT ureter. Foley catheter within decompressed urinary bladder. Bladder wall appears thickened though this may be an artifact from collapsed state. Minimal prostatic enlargement. Post LEFT nephrectomy. Large soft tissue mass LEFT upper quadrant 17.6 x 10.7 x 8.6 cm in size, mixed attenuation with areas of low-attenuation centrally and irregular soft tissue attenuation peripherally. Mass abuts anterior margin of spleen, lateral margin of stomach, and pancreatic tail but epicenter of mass appears external to these structures. Surrounding irregular infiltrative changes of LEFT upper quadrant fat with increased number of vessels. Overlying hernia of soft tissue and fat screw surgical  defect in the LEFT flank abdominal wall without bowel herniation. This could represent lymphoma or potentially recurrent renal carcinoma. Stomach/Bowel: Diverticulosis of sigmoid and descending colon without definite evidence of diverticulitis. Stomach decompressed limiting assessment of wall thickness. Bowel loops otherwise unremarkable. Appendix not definitely visualized. Vascular/Lymphatic: Mesenteric collaterals/varices particularly RIGHT of midline. Atherosclerotic calcifications aorta, iliac arteries, femoral arteries, coronary arteries, celiac artery, and splenic artery. Aorta normal caliber. No adenopathy. Reproductive: N/A Other: No free air or free fluid. Musculoskeletal: Diffuse osseous demineralization. Multiple thoracolumbar compression fractures with prior spinal augmentation procedures at 3 levels. IMPRESSION: Post LEFT nephrectomy. Marked interval increase in size of a soft tissue mass in the LEFT upper quadrant with question central necrosis, now measuring 17.6 x 10.7 x 8.6 cm, favor lymphoma or recurrent renal cell carcinoma ; while this mass abuts the pancreatic tail, lateral wall of stomach, and anterior margin of spleen, the lesion appears to have an epicenter external to these organs. LEFT flank hernia through nephrectomy scar. Chronic enlargement of pancreatic tail, by prior CT consisting of ductal dilatation and mild parenchymal prominence, less well visualized on current noncontrast technique exam. Distal colonic diverticulosis. Aortic atherosclerosis with additional atherosclerotic changes as above. Mesenteric collaterals/varices. Electronically Signed   By: Lavonia Dana M.D.   On: 05/04/2016 17:46   Dg Chest 2 View  Result Date: 05/12/2016 CLINICAL DATA:  82 year old male with weakness and tachycardia. EXAM: CHEST  2 VIEW COMPARISON:  05/04/2016 and prior radiographs FINDINGS: This is a low volume film. Increased left lower lung opacity may represent atelectasis or airspace disease/  pneumonia. Mild pulmonary vascular congestion is noted. Upper limits normal heart size noted. No pneumothorax or definite pleural effusion noted. No acute bony abnormalities are identified. IMPRESSION: Low volume film with increased left lower lung opacity-question atelectasis versus airspace disease. Mild pulmonary vascular congestion. Electronically Signed   By: Margarette Canada M.D.   On: 05/12/2016 18:05   Dg Chest 2 View  Result Date: 05/04/2016 CLINICAL DATA:  82 year old male with fever. EXAM: CHEST  2 VIEW COMPARISON:  11/22/2015 FINDINGS: Lower lung volumes leading to bronchovascular crowding and accentuation of the heart size. There is tortuosity and atherosclerosis of the  thoracic aorta, unchanged from prior allowing for differences in technique. Bibasilar atelectasis, no confluent airspace disease allowing for low lung volumes. No pleural effusion or pneumothorax. Multiple vertebral compression deformities with kyphoplasty. IMPRESSION: Low lung volumes with bronchovascular crowding and bibasilar atelectasis. No evidence of focal pneumonia on low volume chest. Electronically Signed   By: Jeb Levering M.D.   On: 05/04/2016 19:54   Ct Head Wo Contrast  Result Date: 05/12/2016 CLINICAL DATA:  Weakness. EXAM: CT HEAD WITHOUT CONTRAST TECHNIQUE: Contiguous axial images were obtained from the base of the skull through the vertex without intravenous contrast. COMPARISON:  11/22/2013 FINDINGS: Brain: No evidence of acute infarction, hemorrhage, hydrocephalus, or mass lesion/mass effect. Remote small bilateral cerebellar infarcts which appears stable from previous. Remote right posterior temporal and parietal cortically based infarct. Small remote bilateral occipital and right posterior frontal cortical infarcts. Small-vessel ischemic change in the cerebral white matter. Vascular: Atherosclerotic calcification. Opacified vessels from recent enhanced abdominal CT. Skull: Negative for fracture or focal lesion.  Sinuses/Orbits: Left sphenoid sinusitis is chronic based on prior, with fluid level currently. Bilateral cataract resection Other: None. IMPRESSION: 1. No acute intracranial finding or change compared to 2015. 2. Numerous remote infarcts as described above. 3. Chronic left sphenoid sinusitis.  Fluid level is present today. Electronically Signed   By: Monte Fantasia M.D.   On: 05/12/2016 18:18   Ct Chest W Contrast  Result Date: 05/05/2016 CLINICAL DATA:  Renal cancer.  Evaluate for metastatic disease. EXAM: CT CHEST WITH CONTRAST TECHNIQUE: Multidetector CT imaging of the chest was performed during intravenous contrast administration. CONTRAST:  36mL ISOVUE-300 IOPAMIDOL (ISOVUE-300) INJECTION 61% COMPARISON:  11/09/2006 FINDINGS: Cardiovascular: Heart is enlarged. No pericardial effusion. Coronary artery calcification is noted. Mediastinum/Nodes: Scattered upper normal size mediastinal lymph nodes are not substantially changed in the interval. One of the larger lymph nodes is a 10 mm short axis subcarinal node that is unchanged in the interval. There is no hilar lymphadenopathy. The esophagus has normal imaging features. There is no axillary lymphadenopathy. Lungs/Pleura: There is chronic scarring in the left lung base with volume loss and bronchiectasis, progressed in the interval. Superimposed atelectasis or progressive scar noted on today's exam. Superimposed airspace infection cannot be excluded. Volume loss with bronchiectasis noted posterior right lower lobe and in the lingula. No discrete pulmonary nodules to raise significant concern for pulmonary metastatic disease in this patient with a history of renal cell carcinoma. No substantial pleural effusion. Upper Abdomen: Large left upper quadrant heterogeneous soft tissue mass better seen on recent CT scan of the abdomen and pelvis. Musculoskeletal: Patient is status post multilevel thoracolumbar vertebral augmentation with un treated compression deformity  seen at the T11 vertebral body. IMPRESSION: 1. No definite CT findings to suggest metastatic disease to the chest. 2. Bilateral lower lobe, left greater than right, collapse/consolidation with scarring. 3. Coronary artery atherosclerosis. Electronically Signed   By: Misty Stanley M.D.   On: 05/05/2016 10:31   Mr Jeri Cos [id number] Contrast  Result Date: 05/17/2016 CLINICAL DATA:  Slurred speech. EXAM: MRI HEAD WITHOUT AND WITH CONTRAST TECHNIQUE: Multiplanar, multiecho pulse sequences of the brain and surrounding structures were obtained without and with intravenous contrast. CONTRAST:  61mL MULTIHANCE GADOBENATE DIMEGLUMINE 529 MG/ML IV SOLN COMPARISON:  Head CT 05/12/2016 and MRI 02/04/2004 FINDINGS: The study is moderately motion degraded despite repeated imaging and utilizing faster, more motion resistant protocols. There is a large acute left PCA infarct involving the temporal and occipital lobes. A small amount of confluent petechial  hemorrhage is present in the anterior left occipital lobe. Small acute infarcts are present in both cerebral hemispheres, right caudate nucleus, and right frontal, right occipital, and right parietal lobe cortex. There are chronic cortical infarcts involving the right parietal greater than right frontal lobes. Small volume chronic blood products are noted in the right parietal lobe. There are chronic bilateral cerebellar infarcts. T2 hyperintensities elsewhere in the deep cerebral white matter bilaterally are nonspecific but compatible with mild-to-moderate chronic small vessel ischemic disease. There is moderate cerebral atrophy. No mass, midline shift, or extra-axial fluid collection is seen. No abnormal enhancement is identified within limitations of motion. Prior bilateral cataract extraction is noted. Circumferential mucosal thickening and fluid are present in the left sphenoid sinus. The mastoid air cells are clear. Major intracranial vascular flow voids are grossly preserved  with vertebrobasilar and ICA dolichoectasia. IMPRESSION: 1. Large acute left PCA infarct with petechial hemorrhage. 2. Small acute embolic infarcts in the cerebellum and right cerebral hemisphere. 3. Chronic ischemic changes as above. Electronically Signed   By: Logan Bores M.D.   On: 05/17/2016 14:16   Ct Abdomen Pelvis W Contrast  Result Date: 05/12/2016 CLINICAL DATA:  had a biopsy of a left upper quadrant mass today. tachycardia (he was in the 140-150 range after procedure); he arrives here drowsy and in no distress with a heart rate of ~125 and is a regular supraventricular tach. Family are with him also. He is drowsy and is slow to arouse and answers all questions regarding his situation lucidly. He has an indwelling foley cath. In place and a sm. Clean dressing at left lower rib area EXAM: CT ABDOMEN AND PELVIS WITH CONTRAST TECHNIQUE: Multidetector CT imaging of the abdomen and pelvis was performed using the standard protocol following bolus administration of intravenous contrast. CONTRAST:  112mL ISOVUE-300 IOPAMIDOL (ISOVUE-300) INJECTION 61% COMPARISON:  Biopsy images from earlier the same day, and prior studies FINDINGS: Lower chest: Trace bilateral pleural effusions. Consolidation/ atelectasis posteriorly in the visualized lower lobes. Coronary calcifications. Hepatobiliary: Fatty liver without focal lesion. Partially calcified stones in the nondilated gallbladder. Pancreas: Mild atrophy in the mid body and head. There is masslike enlargement of pancreatic tail and a contiguous complex 17.8 cm mass with peripheral enhancement, central low-attenuation, stable compared to scans dating back to 05/04/2016. No evidence of hematoma or active extravasation. Spleen: The left upper quadrant mass abuts the splenic hilum. There are small wedge-shaped areas of decreased enhancement in the spleen suggesting direct involvement by the mass. Splenic vein thrombosis. Adrenals/Urinary Tract: Previous left  nephrectomy. Right adrenal gland and kidney unremarkable. Foley catheter decompresses urinary bladder. Stomach/Bowel: Stomach is decompressed, displaced medially by at the left upper quadrant mass without definite invasion. Small bowel and colon are nondilated. Innumerable sigmoid diverticula without adjacent inflammatory/edematous change. Vascular/Lymphatic: Moderate aortoiliac arterial calcifications without aneurysm or stenosis. Cavernous transformation of the portal vein. Apparent thrombosis of the splenic vein. SMV remains patent. Reproductive: No mass or other significant abnormality. Other: Scattered low-attenuation ascites in the pelvis and pericolic gutters left greater than right. No suggestion of hemoperitoneum. Musculoskeletal: Left lateral body wall hernia containing only fat. Multiple stable lower thoracic and lumbar compression deformities, post vertebral augmentation at T10 and L1. IMPRESSION: 1. Negative for hemorrhage or other acute complication of left upper quadrant mass biopsy. Critical Value/emergent results were called by telephone at the time of interpretation on 05/12/2016 at 5:03 pm to Dr. Laurence Ferrari, who verbally acknowledged these results. 2. Stable left upper quadrant mass abutting the pancreas, probably  invading the splenic hilum, with splenic and portal vein thrombosis. 3. Additional ancillary findings as above, stable since previous exam. Electronically Signed   By: Lucrezia Europe M.D.   On: 05/12/2016 17:04   Ct Biopsy  Result Date: 05/12/2016 INDICATION: 82 year old male with a prior history of renal cell carcinoma and a large left upper quadrant mass concerning for recurrent disease. He presents for biopsy. EXAM: CT BIOPSY MEDICATIONS: None. ANESTHESIA/SEDATION: Moderate (conscious) sedation was employed during this procedure. A total of Versed 1 mg and Fentanyl 50 mcg was administered intravenously. Moderate Sedation Time: 10 minutes. The patient's level of consciousness and vital  signs were monitored continuously by radiology nursing throughout the procedure under my direct supervision. FLUOROSCOPY TIME:  None COMPLICATIONS: None immediate. PROCEDURE: Informed written consent was obtained from the patient after a thorough discussion of the procedural risks, benefits and alternatives. All questions were addressed. A timeout was performed prior to the initiation of the procedure. A planning axial CT scan was performed. The large left upper quadrant abdominal mass was identified. A suitable skin entry site was selected and marked. The region was sterilely prepped and draped in standard fashion using chlorhexidine skin prep. Local anesthesia was attained by infiltration with 1% lidocaine. A small dermatotomy was made. Using intermittent CT guidance, a 19 gauge introducer needle was advanced into the margin of the mass. The introducer needle was positioned to biopsy needle rind of the lesion has centrally it is low attenuation and likely necrotic. Multiple 18 gauge core biopsies were then coaxially obtained using the bio Pince automated biopsy device. Biopsy specimens were placed in saline and delivered to pathology for further analysis. Following removal of the biopsy device and introducer needle, and axial CT scan was performed which demonstrates no hematoma or other complicating feature. The patient tolerated the procedure well. IMPRESSION: Technically successful CT-guided biopsy of left upper quadrant abdominal mass. Of note, the mass is centrally necrotic. Efforts were made to biopsy the peripheral rim of the lesion in hopes of obtaining viable tissue for diagnosis. Electronically Signed   By: Jacqulynn Cadet M.D.   On: 05/12/2016 13:03     CBC  Recent Labs Lab 05/13/16 0330 05/14/16 0525 05/18/16 1216 05/19/16 0027  WBC 14.7* 9.0 7.6 7.6  HGB 11.0* 9.9* 10.4* 10.5*  HCT 32.4* 28.8* 31.9* 31.8*  PLT 335 300 285 313  MCV 90.0 88.9 90.9 90.1  MCH 30.6 30.6 29.6 29.7  MCHC  34.0 34.4 32.6 33.0  RDW 15.6* 15.9* 15.7* 15.7*    Chemistries   Recent Labs Lab 05/13/16 0330 05/14/16 0525 05/18/16 1216 05/19/16 0027  NA 136 135 132* 133*  K 4.1 4.1 4.2 4.0  CL 107 107 104 103  CO2 23 24 22 24   GLUCOSE 106* 99 93 105*  BUN 18 22* 14 15  CREATININE 0.90 0.79 0.76 0.83  CALCIUM 7.8* 7.8* 8.1* 8.2*  MG  --   --  2.0  --   AST 52*  --   --  55*  ALT 26  --   --  25  ALKPHOS 168*  --   --  175*  BILITOT 1.0  --   --  0.8   ------------------------------------------------------------------------------------------------------------------  Recent Labs  05/18/16 0747  CHOL 81  HDL 19*  LDLCALC 51  TRIG 57  CHOLHDL 4.3    Lab Results  Component Value Date   HGBA1C 5.9 (H) 05/18/2016   ------------------------------------------------------------------------------------------------------------------ No results for input(s): TSH, T4TOTAL, T3FREE, THYROIDAB in the last  72 hours.  Invalid input(s): FREET3 ------------------------------------------------------------------------------------------------------------------ No results for input(s): VITAMINB12, FOLATE, FERRITIN, TIBC, IRON, RETICCTPCT in the last 72 hours.  Coagulation profile No results for input(s): INR, PROTIME in the last 168 hours.  No results for input(s): DDIMER in the last 72 hours.  Cardiac Enzymes  Recent Labs Lab 05/12/16 2152 05/13/16 0330  TROPONINI 0.10* 0.06*   ------------------------------------------------------------------------------------------------------------------ No results found for: BNP   Zyia Kaneko M.D on 05/19/2016 at 5:26 PM  Between 7am to 7pm - Pager - [phone]  After 7pm go to [email or web address] - password [email or web address]  [facility] -  Office  [phone]  Dragon dictation system was used to create this note, attempts have been made to correct errors, however presence of uncorrected errors is not a reflection quality of care provided

## 2016-05-20 ENCOUNTER — Inpatient Hospital Stay (HOSPITAL_COMMUNITY): Payer: Medicare Other

## 2016-05-20 LAB — VAS US CAROTID
LCCAPDIAS: -9 cm/s
LCCAPSYS: -69 cm/s
LEFT ECA DIAS: -11 cm/s
LEFT VERTEBRAL DIAS: 11 cm/s
LICADDIAS: -18 cm/s
LICAPSYS: -63 cm/s
Left CCA dist dias: -10 cm/s
Left CCA dist sys: -63 cm/s
Left ICA dist sys: -77 cm/s
Left ICA prox dias: -7 cm/s
RCCADSYS: -58 cm/s
RCCAPDIAS: 11 cm/s
RIGHT ECA DIAS: -7 cm/s
RIGHT VERTEBRAL DIAS: 12 cm/s
Right CCA prox sys: 106 cm/s

## 2016-05-20 LAB — CBC
HEMATOCRIT: 32.6 % — AB (ref 39.0–52.0)
Hemoglobin: 10.9 g/dL — ABNORMAL LOW (ref 13.0–17.0)
MCH: 30.3 pg (ref 26.0–34.0)
MCHC: 33.4 g/dL (ref 30.0–36.0)
MCV: 90.6 fL (ref 78.0–100.0)
PLATELETS: 307 10*3/uL (ref 150–400)
RBC: 3.6 MIL/uL — AB (ref 4.22–5.81)
RDW: 15.8 % — AB (ref 11.5–15.5)
WBC: 7.9 10*3/uL (ref 4.0–10.5)

## 2016-05-20 LAB — HEPARIN LEVEL (UNFRACTIONATED): Heparin Unfractionated: 0.48 IU/mL (ref 0.30–0.70)

## 2016-05-20 NOTE — Progress Notes (Addendum)
Speech Language Pathology  Patient Details Name: [name] MRN: [id number] DOB: July 28, 1925 Today's Date: 05/20/2016 Time:  -      MBS scheduled for today at 1330 to fully assess oropharyngeal swallow function and safest most appropriate diet/liquids.      Orbie Pyo [email or web address].[employer] [phone]

## 2016-05-20 NOTE — Progress Notes (Signed)
ANTICOAGULATION CONSULT NOTE - Follow Up Consult  Pharmacy Consult for Heparin  Indication: DVT   Allergies  Allergen Reactions  . Dicyclomine Other (See Comments)    REACTION: can worsen myasthenia gravis    Patient Measurements: Height: 5\' 10"  (177.8 cm) Weight: 241 lb 8 oz (109.5 kg) IBW/kg (Calculated) : 73  Heparin Dosing Weight: 97 kg  Vital Signs: Temp: 98 F (36.7 C) (08/17 0519) Temp Source: Oral (08/17 0519) BP: 158/76 (08/17 0519) Pulse Rate: 90 (08/17 0519)  Labs:  Recent Labs  05/18/16 1216  05/19/16 0027 05/19/16 1000 05/19/16 1903 05/20/16 0638  HGB 10.4*  --  10.5*  --   --  10.9*  HCT 31.9*  --  31.8*  --   --  32.6*  PLT 285  --  313  --   --  307  HEPARINUNFRC  --   < > 0.12* 0.22* 0.47 0.48  CREATININE 0.76  --  0.83  --   --   --   < > = values in this interval not displayed.  Estimated Creatinine Clearance: 76.2 mL/min (by C-G formula based on SCr of 0.83 mg/dL).  Assessment: New DVT in setting of acute stroke, initial heparin level is sub-therapeutic, no issues per RN.   This morning's HL remains therapeutic at 0.48 on heparin 1500 units/hr. No issues with infusion or bleeding noted.  Goal of Therapy:  Heparin level 0.3-0.5 units/ml, in setting of acute stroke Monitor platelets by anticoagulation protocol: Yes   Plan:  Continue heparin 1500 units/hr Daily HL Monitor s/sx of bleeding   Andrey Cota. Diona Foley, PharmD, BCPS Clinical Pharmacist Pager [phone] 05/20/2016,7:59 AM

## 2016-05-20 NOTE — Care Management Note (Signed)
Case Management Note  Patient Details  Name: [name] MRN: [id number] Date of Birth: 08-Nov-1924  Subjective/Objective:                    Action/Plan: Pt continues on IV antibiotic and on IV heparin for DVT. Recommendations are for SNF. CM following for further d/c needs.   Expected Discharge Date:   (unknown)               Expected Discharge Plan:  Skilled Nursing Facility  In-House Referral:  Clinical Social Work  Discharge planning Services  CM Consult  Post Acute Care Choice:    Choice offered to:     DME Arranged:    DME Agency:     HH Arranged:    [place] Agency:     Status of Service:  In process, will continue to follow  If discussed at Long Length of Stay Meetings, dates discussed:    Additional Comments:  Pollie Friar, RN 05/20/2016, 3:54 PM

## 2016-05-20 NOTE — Telephone Encounter (Signed)
I called again, unable to leave a message I will try to call again tomorrow.

## 2016-05-20 NOTE — Progress Notes (Signed)
Patient Demographics:    [name], is a 82 y.o. male, DOB - [birth year], [id number]  Admit date - 05/12/2016   Admitting Physician Reubin Milan, MD  Outpatient Primary MD for the patient is [name], MD  LOS - 6   Chief Complaint  Patient presents with  . Tachycardia        Subjective:    [name] today has no fevers, no emesis,  No chest pain,  No new c/o , Rt leg pain is better   Assessment  & Plan :    Principal Problem:   HCAP (healthcare-associated pneumonia) Active Problems:   MYASTHENIA GRAVIS W/O (ACUTE) EXACERBATION   Essential hypertension   GERD   BPH (benign prostatic hyperplasia)   Abdominal pain   Abdominal mass, LUQ (left upper quadrant)   Normocytic anemia   CAD (coronary artery disease)   Sinus tachycardia (HCC)   Splenic vein thrombosis   Portal vein thrombosis   Demand ischemia (HCC)   Acute thromboembolic cerebrovascular accident (CVA) ([place])   Metastatic cancer ([place])   Palliative care encounter   DNR (do not resuscitate) discussion   Goals of care, counseling/discussion   Cerebrovascular accident (CVA) due to embolism of cerebral artery ([place])   Acute deep vein thrombosis (DVT) of right lower extremity (HCC)   1)Acute strokes in both cerebral hemispheres and right cerebellar hemisphere-  suspect Hypercoagulable state secondary to underlying malignancy with possible intracardiac source, neurology service has signed off. Continue IV heparin for DVT for 1 to 2 days, and then Lovenox  2)Rt LE DVT- continue IV heparin, Discussed with consider transitioning to Lovenox in 2-3 days  3)intra-abdominal malignancy-axis post previous left nephrectomy for malignancy, ??? If recurrence, final pathology reports pending  4)Other- palliative care consult appreciated to help determining goals of care and CODE STATUS  5)Myasthenia Gravis- Mestinon has been   increased due to generalized weakness  Disposition Plan  : to be determined  Consults  :  neuro   DVT Prophylaxis  : iv  Heparin -  Lab Results  Component Value Date   PLT 307 05/20/2016    Inpatient Medications  Scheduled Meds: . antiseptic oral rinse  7 mL Mouth Rinse BID  . famotidine  20 mg Oral Daily  . guaiFENesin-dextromethorphan  10 mL Oral TID  . metoprolol succinate  50 mg Oral Daily  . multivitamin with minerals  1 tablet Oral Daily  . mycophenolate  1,000 mg Oral QHS  . mycophenolate  500 mg Oral Daily  . pyridostigmine  60 mg Oral QID  . sodium chloride flush  3 mL Intravenous Q12H  . tamsulosin  0.4 mg Oral Daily   Continuous Infusions: . heparin 1,500 Units/hr (05/20/16 0714)   PRN Meds:.food thickener, levalbuterol, polyethylene glycol, RESOURCE THICKENUP CLEAR, senna    Anti-infectives    Start     Dose/Rate Route Frequency Ordered Stop   05/15/16 0900  vancomycin (VANCOCIN) IVPB 1000 mg/200 mL premix     1,000 mg 200 mL/hr over 60 Minutes Intravenous Every 12 hours 05/15/16 0818 05/19/16 1224   05/13/16 0800  vancomycin (VANCOCIN) IVPB 750 mg/150 ml premix  Status:  Discontinued     750 mg 150 mL/hr over 60 Minutes Intravenous Every 12  hours 05/12/16 2208 05/15/16 0818   05/13/16 0600  ceFEPIme (MAXIPIME) 1 g in dextrose 5 % 50 mL IVPB     1 g 100 mL/hr over 30 Minutes Intravenous Every 8 hours 05/12/16 2134 05/19/16 2202   05/12/16 2300  vancomycin (VANCOCIN) 2,000 mg in sodium chloride 0.9 % 500 mL IVPB     2,000 mg 250 mL/hr over 120 Minutes Intravenous  Once 05/12/16 2208 05/13/16 0230   05/12/16 2230  ceFEPIme (MAXIPIME) 2 g in dextrose 5 % 50 mL IVPB     2 g 100 mL/hr over 30 Minutes Intravenous  Once 05/12/16 2208 05/13/16 0003   05/12/16 2200  amoxicillin-clavulanate (AUGMENTIN) 250-125 MG per tablet 1 tablet  Status:  Discontinued     1 tablet Oral 3 times daily 05/12/16 2134 05/12/16 2206        Objective:   Vitals:   05/20/16  0519 05/20/16 0904 05/20/16 1446 05/20/16 1726  BP: (!) 158/76 (!) 148/72 (!) 114/57 (!) 108/49  Pulse: 90 85 76 65  Resp: 20 18 18 18   Temp: 98 F (36.7 C) 97.8 F (36.6 C) 98.1 F (36.7 C) 97.8 F (36.6 C)  TempSrc: Oral Oral Oral Oral  SpO2: 95% 92% 93% 93%  Weight:      Height:        Wt Readings from Last 3 Encounters:  05/17/16 109.5 kg (241 lb 8 oz)  05/12/16 106.2 kg (234 lb 3.2 oz)  05/06/16 106.2 kg (234 lb 3.2 oz)     Intake/Output Summary (Last 24 hours) at 05/20/16 1845 Last data filed at 05/20/16 0645  Gross per 24 hour  Intake              360 ml  Output                0 ml  Net              360 ml     Physical Exam  Gen:- Awake Alert,  Sleepy at times HEENT:- [place].AT, No sclera icterus Neck-Supple Neck,No JVD,.  Lungs-  CTAB  CV- S1, S2 normal Abd-  +ve B.Sounds, Abd Soft, No tenderness,    Extremity/Skin:- Rt leg swelling /tendermness is better    Data Review:   Micro Results Recent Results (from the past 240 hour(s))  Culture, blood (Routine X 2) w Reflex to ID Panel     Status: None (Preliminary result)   Collection Time: 05/18/16 12:30 PM  Result Value Ref Range Status   Specimen Description BLOOD LEFT ARM  Final   Special Requests IN PEDIATRIC BOTTLE 2 CC  Final   Culture NO GROWTH 2 DAYS  Final   Report Status PENDING  Incomplete  Culture, blood (Routine X 2) w Reflex to ID Panel     Status: None (Preliminary result)   Collection Time: 05/18/16  3:26 PM  Result Value Ref Range Status   Specimen Description BLOOD LEFT ANTECUBITAL  Final   Special Requests BOTTLES DRAWN AEROBIC AND ANAEROBIC 5CC EACH  Final   Culture NO GROWTH 2 DAYS  Final   Report Status PENDING  Incomplete    Radiology Reports Ct Abdomen Pelvis Wo Contrast  Result Date: 05/04/2016 CLINICAL DATA:  Constipation for 1 day, difficulty urinating for 1 day, abdominal pain, history GERD, BPH, GERD, small-bowel obstruction, hypertension, pulmonary embolism, myasthenia  gravis, renal cell carcinoma, coronary artery disease, former smoker EXAM: CT ABDOMEN AND PELVIS WITHOUT CONTRAST TECHNIQUE: Multidetector CT imaging of the  abdomen and pelvis was performed following the standard protocol without IV contrast. Sagittal and coronal MPR images reconstructed from axial data set. Neither oral nor intravenous contrast were administered for this study. COMPARISON:  CT abdomen 03/03/2016 FINDINGS: Lower chest: Bibasilar atelectasis, increased. Peribronchial thickening particularly LEFT lower lobe. Mild interstitial prominence at lung bases little changed. Hepatobiliary: Minimal gallbladder wall calcification. Gallstone seen previously not definitely visualized on current study. No definite focal hepatic lesions. Pancreas: Slight enlargement of the pancreatic tail with ductal dilatation seen on previous exam less well visualized due the lack of IV contrast. Pancreatic appearance little changed. Spleen: No definite intra splenic abnormalities Adrenals/Urinary Tract: Normal-appearing RIGHT adrenal gland. LEFT adrenal gland not visualized. Normal RIGHT kidney and RIGHT ureter. Foley catheter within decompressed urinary bladder. Bladder wall appears thickened though this may be an artifact from collapsed state. Minimal prostatic enlargement. Post LEFT nephrectomy. Large soft tissue mass LEFT upper quadrant 17.6 x 10.7 x 8.6 cm in size, mixed attenuation with areas of low-attenuation centrally and irregular soft tissue attenuation peripherally. Mass abuts anterior margin of spleen, lateral margin of stomach, and pancreatic tail but epicenter of mass appears external to these structures. Surrounding irregular infiltrative changes of LEFT upper quadrant fat with increased number of vessels. Overlying hernia of soft tissue and fat screw surgical defect in the LEFT flank abdominal wall without bowel herniation. This could represent lymphoma or potentially recurrent renal carcinoma. Stomach/Bowel:  Diverticulosis of sigmoid and descending colon without definite evidence of diverticulitis. Stomach decompressed limiting assessment of wall thickness. Bowel loops otherwise unremarkable. Appendix not definitely visualized. Vascular/Lymphatic: Mesenteric collaterals/varices particularly RIGHT of midline. Atherosclerotic calcifications aorta, iliac arteries, femoral arteries, coronary arteries, celiac artery, and splenic artery. Aorta normal caliber. No adenopathy. Reproductive: N/A Other: No free air or free fluid. Musculoskeletal: Diffuse osseous demineralization. Multiple thoracolumbar compression fractures with prior spinal augmentation procedures at 3 levels. IMPRESSION: Post LEFT nephrectomy. Marked interval increase in size of a soft tissue mass in the LEFT upper quadrant with question central necrosis, now measuring 17.6 x 10.7 x 8.6 cm, favor lymphoma or recurrent renal cell carcinoma ; while this mass abuts the pancreatic tail, lateral wall of stomach, and anterior margin of spleen, the lesion appears to have an epicenter external to these organs. LEFT flank hernia through nephrectomy scar. Chronic enlargement of pancreatic tail, by prior CT consisting of ductal dilatation and mild parenchymal prominence, less well visualized on current noncontrast technique exam. Distal colonic diverticulosis. Aortic atherosclerosis with additional atherosclerotic changes as above. Mesenteric collaterals/varices. Electronically Signed   By: Lavonia Dana M.D.   On: 05/04/2016 17:46   Dg Chest 2 View  Result Date: 05/12/2016 CLINICAL DATA:  82 year old male with weakness and tachycardia. EXAM: CHEST  2 VIEW COMPARISON:  05/04/2016 and prior radiographs FINDINGS: This is a low volume film. Increased left lower lung opacity may represent atelectasis or airspace disease/ pneumonia. Mild pulmonary vascular congestion is noted. Upper limits normal heart size noted. No pneumothorax or definite pleural effusion noted. No acute  bony abnormalities are identified. IMPRESSION: Low volume film with increased left lower lung opacity-question atelectasis versus airspace disease. Mild pulmonary vascular congestion. Electronically Signed   By: Margarette Canada M.D.   On: 05/12/2016 18:05   Dg Chest 2 View  Result Date: 05/04/2016 CLINICAL DATA:  82 year old male with fever. EXAM: CHEST  2 VIEW COMPARISON:  11/22/2015 FINDINGS: Lower lung volumes leading to bronchovascular crowding and accentuation of the heart size. There is tortuosity and atherosclerosis of the thoracic aorta,  unchanged from prior allowing for differences in technique. Bibasilar atelectasis, no confluent airspace disease allowing for low lung volumes. No pleural effusion or pneumothorax. Multiple vertebral compression deformities with kyphoplasty. IMPRESSION: Low lung volumes with bronchovascular crowding and bibasilar atelectasis. No evidence of focal pneumonia on low volume chest. Electronically Signed   By: Jeb Levering M.D.   On: 05/04/2016 19:54   Ct Head Wo Contrast  Result Date: 05/12/2016 CLINICAL DATA:  Weakness. EXAM: CT HEAD WITHOUT CONTRAST TECHNIQUE: Contiguous axial images were obtained from the base of the skull through the vertex without intravenous contrast. COMPARISON:  11/22/2013 FINDINGS: Brain: No evidence of acute infarction, hemorrhage, hydrocephalus, or mass lesion/mass effect. Remote small bilateral cerebellar infarcts which appears stable from previous. Remote right posterior temporal and parietal cortically based infarct. Small remote bilateral occipital and right posterior frontal cortical infarcts. Small-vessel ischemic change in the cerebral white matter. Vascular: Atherosclerotic calcification. Opacified vessels from recent enhanced abdominal CT. Skull: Negative for fracture or focal lesion. Sinuses/Orbits: Left sphenoid sinusitis is chronic based on prior, with fluid level currently. Bilateral cataract resection Other: None. IMPRESSION: 1. No  acute intracranial finding or change compared to 2015. 2. Numerous remote infarcts as described above. 3. Chronic left sphenoid sinusitis.  Fluid level is present today. Electronically Signed   By: Monte Fantasia M.D.   On: 05/12/2016 18:18   Ct Chest W Contrast  Result Date: 05/05/2016 CLINICAL DATA:  Renal cancer.  Evaluate for metastatic disease. EXAM: CT CHEST WITH CONTRAST TECHNIQUE: Multidetector CT imaging of the chest was performed during intravenous contrast administration. CONTRAST:  42mL ISOVUE-300 IOPAMIDOL (ISOVUE-300) INJECTION 61% COMPARISON:  11/09/2006 FINDINGS: Cardiovascular: Heart is enlarged. No pericardial effusion. Coronary artery calcification is noted. Mediastinum/Nodes: Scattered upper normal size mediastinal lymph nodes are not substantially changed in the interval. One of the larger lymph nodes is a 10 mm short axis subcarinal node that is unchanged in the interval. There is no hilar lymphadenopathy. The esophagus has normal imaging features. There is no axillary lymphadenopathy. Lungs/Pleura: There is chronic scarring in the left lung base with volume loss and bronchiectasis, progressed in the interval. Superimposed atelectasis or progressive scar noted on today's exam. Superimposed airspace infection cannot be excluded. Volume loss with bronchiectasis noted posterior right lower lobe and in the lingula. No discrete pulmonary nodules to raise significant concern for pulmonary metastatic disease in this patient with a history of renal cell carcinoma. No substantial pleural effusion. Upper Abdomen: Large left upper quadrant heterogeneous soft tissue mass better seen on recent CT scan of the abdomen and pelvis. Musculoskeletal: Patient is status post multilevel thoracolumbar vertebral augmentation with un treated compression deformity seen at the T11 vertebral body. IMPRESSION: 1. No definite CT findings to suggest metastatic disease to the chest. 2. Bilateral lower lobe, left greater  than right, collapse/consolidation with scarring. 3. Coronary artery atherosclerosis. Electronically Signed   By: Misty Stanley M.D.   On: 05/05/2016 10:31   Mr Jeri Cos [id number] Contrast  Result Date: 05/17/2016 CLINICAL DATA:  Slurred speech. EXAM: MRI HEAD WITHOUT AND WITH CONTRAST TECHNIQUE: Multiplanar, multiecho pulse sequences of the brain and surrounding structures were obtained without and with intravenous contrast. CONTRAST:  28mL MULTIHANCE GADOBENATE DIMEGLUMINE 529 MG/ML IV SOLN COMPARISON:  Head CT 05/12/2016 and MRI 02/04/2004 FINDINGS: The study is moderately motion degraded despite repeated imaging and utilizing faster, more motion resistant protocols. There is a large acute left PCA infarct involving the temporal and occipital lobes. A small amount of confluent petechial hemorrhage is  present in the anterior left occipital lobe. Small acute infarcts are present in both cerebral hemispheres, right caudate nucleus, and right frontal, right occipital, and right parietal lobe cortex. There are chronic cortical infarcts involving the right parietal greater than right frontal lobes. Small volume chronic blood products are noted in the right parietal lobe. There are chronic bilateral cerebellar infarcts. T2 hyperintensities elsewhere in the deep cerebral white matter bilaterally are nonspecific but compatible with mild-to-moderate chronic small vessel ischemic disease. There is moderate cerebral atrophy. No mass, midline shift, or extra-axial fluid collection is seen. No abnormal enhancement is identified within limitations of motion. Prior bilateral cataract extraction is noted. Circumferential mucosal thickening and fluid are present in the left sphenoid sinus. The mastoid air cells are clear. Major intracranial vascular flow voids are grossly preserved with vertebrobasilar and ICA dolichoectasia. IMPRESSION: 1. Large acute left PCA infarct with petechial hemorrhage. 2. Small acute embolic infarcts in the  cerebellum and right cerebral hemisphere. 3. Chronic ischemic changes as above. Electronically Signed   By: Logan Bores M.D.   On: 05/17/2016 14:16   Ct Abdomen Pelvis W Contrast  Result Date: 05/12/2016 CLINICAL DATA:  had a biopsy of a left upper quadrant mass today. tachycardia (he was in the 140-150 range after procedure); he arrives here drowsy and in no distress with a heart rate of ~125 and is a regular supraventricular tach. Family are with him also. He is drowsy and is slow to arouse and answers all questions regarding his situation lucidly. He has an indwelling foley cath. In place and a sm. Clean dressing at left lower rib area EXAM: CT ABDOMEN AND PELVIS WITH CONTRAST TECHNIQUE: Multidetector CT imaging of the abdomen and pelvis was performed using the standard protocol following bolus administration of intravenous contrast. CONTRAST:  147mL ISOVUE-300 IOPAMIDOL (ISOVUE-300) INJECTION 61% COMPARISON:  Biopsy images from earlier the same day, and prior studies FINDINGS: Lower chest: Trace bilateral pleural effusions. Consolidation/ atelectasis posteriorly in the visualized lower lobes. Coronary calcifications. Hepatobiliary: Fatty liver without focal lesion. Partially calcified stones in the nondilated gallbladder. Pancreas: Mild atrophy in the mid body and head. There is masslike enlargement of pancreatic tail and a contiguous complex 17.8 cm mass with peripheral enhancement, central low-attenuation, stable compared to scans dating back to 05/04/2016. No evidence of hematoma or active extravasation. Spleen: The left upper quadrant mass abuts the splenic hilum. There are small wedge-shaped areas of decreased enhancement in the spleen suggesting direct involvement by the mass. Splenic vein thrombosis. Adrenals/Urinary Tract: Previous left nephrectomy. Right adrenal gland and kidney unremarkable. Foley catheter decompresses urinary bladder. Stomach/Bowel: Stomach is decompressed, displaced medially by  at the left upper quadrant mass without definite invasion. Small bowel and colon are nondilated. Innumerable sigmoid diverticula without adjacent inflammatory/edematous change. Vascular/Lymphatic: Moderate aortoiliac arterial calcifications without aneurysm or stenosis. Cavernous transformation of the portal vein. Apparent thrombosis of the splenic vein. SMV remains patent. Reproductive: No mass or other significant abnormality. Other: Scattered low-attenuation ascites in the pelvis and pericolic gutters left greater than right. No suggestion of hemoperitoneum. Musculoskeletal: Left lateral body wall hernia containing only fat. Multiple stable lower thoracic and lumbar compression deformities, post vertebral augmentation at T10 and L1. IMPRESSION: 1. Negative for hemorrhage or other acute complication of left upper quadrant mass biopsy. Critical Value/emergent results were called by telephone at the time of interpretation on 05/12/2016 at 5:03 pm to Dr. Laurence Ferrari, who verbally acknowledged these results. 2. Stable left upper quadrant mass abutting the pancreas, probably invading the  splenic hilum, with splenic and portal vein thrombosis. 3. Additional ancillary findings as above, stable since previous exam. Electronically Signed   By: Lucrezia Europe M.D.   On: 05/12/2016 17:04   Ct Biopsy  Result Date: 05/12/2016 INDICATION: 82 year old male with a prior history of renal cell carcinoma and a large left upper quadrant mass concerning for recurrent disease. He presents for biopsy. EXAM: CT BIOPSY MEDICATIONS: None. ANESTHESIA/SEDATION: Moderate (conscious) sedation was employed during this procedure. A total of Versed 1 mg and Fentanyl 50 mcg was administered intravenously. Moderate Sedation Time: 10 minutes. The patient's level of consciousness and vital signs were monitored continuously by radiology nursing throughout the procedure under my direct supervision. FLUOROSCOPY TIME:  None COMPLICATIONS: None immediate.  PROCEDURE: Informed written consent was obtained from the patient after a thorough discussion of the procedural risks, benefits and alternatives. All questions were addressed. A timeout was performed prior to the initiation of the procedure. A planning axial CT scan was performed. The large left upper quadrant abdominal mass was identified. A suitable skin entry site was selected and marked. The region was sterilely prepped and draped in standard fashion using chlorhexidine skin prep. Local anesthesia was attained by infiltration with 1% lidocaine. A small dermatotomy was made. Using intermittent CT guidance, a 19 gauge introducer needle was advanced into the margin of the mass. The introducer needle was positioned to biopsy needle rind of the lesion has centrally it is low attenuation and likely necrotic. Multiple 18 gauge core biopsies were then coaxially obtained using the bio Pince automated biopsy device. Biopsy specimens were placed in saline and delivered to pathology for further analysis. Following removal of the biopsy device and introducer needle, and axial CT scan was performed which demonstrates no hematoma or other complicating feature. The patient tolerated the procedure well. IMPRESSION: Technically successful CT-guided biopsy of left upper quadrant abdominal mass. Of note, the mass is centrally necrotic. Efforts were made to biopsy the peripheral rim of the lesion in hopes of obtaining viable tissue for diagnosis. Electronically Signed   By: Jacqulynn Cadet M.D.   On: 05/12/2016 13:03   Dg Swallowing Func-speech Pathology  Result Date: 05/20/2016 Objective Swallowing Evaluation: Type of Study: MBS-Modified Barium Swallow Study Patient Details Name: KANARI STVIL MRN: [id number] Date of Birth: [birth year] Today's Date: 05/20/2016 Time: SLP Start Time (ACUTE ONLY): 1328-SLP Stop Time (ACUTE ONLY): 1345 SLP Time Calculation (min) (ACUTE ONLY): 17 min Past Medical History: Past Medical History:  Diagnosis Date . Arthritis   back, hips, knees  . Benign prostatic hypertrophy  . Cancer [facility])   h/o renal carcinoma  . Chronic kidney disease   h/o UTI-2012 . Coronary artery disease   sees Dr. Shelva Majestic  . Diverticulitis  . GERD (gastroesophageal reflux disease)  . H/O epistaxis  . Heart disease  . Hypertension  . Myasthenia gravis [facility])   sees Dr. Jannifer Franklin . Obesity   exogenous . Osteoporosis  . Partial small bowel obstruction ([place]) 04/16/10  resolved with bowel rest . Pulmonary embolus ([place])   2000 . Shortness of breath  . Urinary incontinence  . UTI (urinary tract infection)  Past Surgical History: Past Surgical History: Procedure Laterality Date . ANGIOPLASTY    had 2 stent replacements . CARDIAC CATHETERIZATION    2008, stents  . CARDIAC CATHETERIZATION  2008 . COLONOSCOPY  05/28/10  per Dr. Laurence Spates, diverticulosis, no repeats planned  . DOPPLER ECHOCARDIOGRAPHY   . EYE SURGERY    cataracts(bilateral)  removed, ?iol .  JOINT REPLACEMENT    2005-R, L knee replacement- 2000 . NEPHRECTOMY    left . NM MYOVIEW LTD  02/2011  moderate inferior scar no ischemia . SP KYPHOPLASTY    and vertebroplasty . stented placed  1/08 . stress dipyridamole myocardial perfusion   . TONSILLECTOMY   . TOTAL KNEE ARTHROPLASTY    both Sees Dr. Novella Olive . TOTAL KNEE REVISION  03/28/2012  Procedure: TOTAL KNEE REVISION;  Surgeon: Hessie Dibble, MD;  Location: [place];  Service: Orthopedics;  Laterality: Right; HPI: 82 yo male adm [facility] with weakness - recent biopsy for LUQ mass - pt was diagnosed with HCAP.  PMH + for GERD, renal cancer, Myasthenia Gravis on Mestinon.  CT head negative.  CXR left lower lobe opacity.  Swallow evaluation ordered.    Subjective: pt awake in bed Assessment / Plan / Recommendation CHL IP CLINICAL IMPRESSIONS 05/20/2016 Therapy Diagnosis Mild oral phase dysphagia;Mild pharyngeal phase dysphagia;Moderate pharyngeal phase dysphagia Clinical Impression Mild oral and mile-moderate pharyngeal dysphagia marked  by lingual residue post swallow, decreased laryngeal elevation and closure resulting in silent aspiration during the swallow with thin. Mild vallecular and pyriform sinus residue from base of tongue weakness and decreased laryngeal elevation. Pt exhiibted decreased endurance and fatigue throughout assessment. Recommended pt continue with nectar and Dys 3 (due to oral mastication and transit delay), no straws, swallow twice intermittently, full supervision and meds whole in applesauce. Continue to follow.  Impact on safety and function Moderate aspiration risk   CHL IP TREATMENT RECOMMENDATION 05/20/2016 Treatment Recommendations Therapy as outlined in treatment plan below   Prognosis 05/20/2016 Prognosis for Safe Diet Advancement Fair Barriers to Reach Goals -- Barriers/Prognosis Comment -- CHL IP DIET RECOMMENDATION 05/20/2016 SLP Diet Recommendations Dysphagia 3 (Mech soft) solids;Nectar thick liquid Liquid Administration via Cup;No straw Medication Administration Whole meds with puree Compensations Slow rate;Small sips/bites;Multiple dry swallows after each bite/sip Postural Changes Seated upright at 90 degrees   CHL IP OTHER RECOMMENDATIONS 05/20/2016 Recommended Consults -- Oral Care Recommendations Oral care BID Other Recommendations --   CHL IP FOLLOW UP RECOMMENDATIONS 05/20/2016 Follow up Recommendations (No Data)   CHL IP FREQUENCY AND DURATION 05/20/2016 Speech Therapy Frequency (ACUTE ONLY) min 2x/week Treatment Duration 2 weeks      CHL IP ORAL PHASE 05/20/2016 Oral Phase Impaired Oral - Pudding Teaspoon -- Oral - Pudding Cup -- Oral - Honey Teaspoon -- Oral - Honey Cup -- Oral - Nectar Teaspoon -- Oral - Nectar Cup Lingual/palatal residue Oral - Nectar Straw -- Oral - Thin Teaspoon -- Oral - Thin Cup Lingual/palatal residue Oral - Thin Straw -- Oral - Puree -- Oral - Mech Soft -- Oral - Regular Lingual/palatal residue Oral - Multi-Consistency -- Oral - Pill -- Oral Phase - Comment --  CHL IP PHARYNGEAL PHASE  05/20/2016 Pharyngeal Phase Impaired Pharyngeal- Pudding Teaspoon -- Pharyngeal -- Pharyngeal- Pudding Cup -- Pharyngeal -- Pharyngeal- Honey Teaspoon -- Pharyngeal -- Pharyngeal- Honey Cup -- Pharyngeal -- Pharyngeal- Nectar Teaspoon -- Pharyngeal -- Pharyngeal- Nectar Cup Pharyngeal residue - valleculae;Pharyngeal residue - pyriform;Reduced tongue base retraction;Reduced laryngeal elevation Pharyngeal -- Pharyngeal- Nectar Straw -- Pharyngeal -- Pharyngeal- Thin Teaspoon -- Pharyngeal -- Pharyngeal- Thin Cup Penetration/Aspiration during swallow;Reduced laryngeal elevation;Reduced airway/laryngeal closure Pharyngeal Material enters airway, passes BELOW cords without attempt by patient to eject out (silent aspiration);Material enters airway, remains ABOVE vocal cords then ejected out Pharyngeal- Thin Straw -- Pharyngeal -- Pharyngeal- Puree -- Pharyngeal -- Pharyngeal- Mechanical Soft -- Pharyngeal -- Pharyngeal- Regular Pharyngeal  residue - valleculae;Reduced tongue base retraction Pharyngeal -- Pharyngeal- Multi-consistency -- Pharyngeal -- Pharyngeal- Pill -- Pharyngeal -- Pharyngeal Comment --  CHL IP CERVICAL ESOPHAGEAL PHASE 05/20/2016 Cervical Esophageal Phase WFL Pudding Teaspoon -- Pudding Cup -- Honey Teaspoon -- Honey Cup -- Nectar Teaspoon -- Nectar Cup -- Nectar Straw -- Thin Teaspoon -- Thin Cup -- Thin Straw -- Puree -- Mechanical Soft -- Regular -- Multi-consistency -- Pill -- Cervical Esophageal Comment -- CHL IP GO 05/14/2016 Functional Assessment Tool Used (None) Functional Limitations Swallowing Swallow Current Status [id number]) CL Swallow Goal Status [id number]) CK Swallow Discharge Status [id number]) (None) Motor Speech Current Status [id number]) (None) Motor Speech Goal Status UK:060616) (None) Motor Speech Goal Status SA:931536) (None) Spoken Language Comprehension Current Status MZ:5018135) (None) Spoken Language Comprehension Goal Status YD:1972797) (None) Spoken Language Comprehension Discharge Status UF:[id number]) (None)  Spoken Language Expression Current Status FP:837989) (None) Spoken Language Expression Goal Status LT:9098795) (None) Spoken Language Expression Discharge Status NF:1565649) (None) Attention Current Status OM:1732502) (None) Attention Goal Status EY:7266000) (None) Attention Discharge Status PJ:4613913) (None) Memory Current Status YL:3545582) (None) Memory Goal Status CF:3682075) (None) Memory Discharge Status QC:115444) (None) Voice Current Status BV:6183357) (None) Voice Goal Status EW:8517110) (None) Voice Discharge Status [id number]:9561856) (None) Other Speech-Language Pathology Functional Limitation UC:978821) (None) Other Speech-Language Pathology Functional Limitation Goal Status XD:1448828) (None) Other Speech-Language Pathology Functional Limitation Discharge Status [phone]) (None) Houston Siren 05/20/2016, 2:42 PM Orbie Pyo Colvin Caroli.Ed CCC-SLP Pager [phone]                CBC  Recent Labs Lab 05/14/16 0525 05/18/16 1216 05/19/16 0027 05/20/16 0638  WBC 9.0 7.6 7.6 7.9  HGB 9.9* 10.4* 10.5* 10.9*  HCT 28.8* 31.9* 31.8* 32.6*  PLT 300 285 313 307  MCV 88.9 90.9 90.1 90.6  MCH 30.6 29.6 29.7 30.3  MCHC 34.4 32.6 33.0 33.4  RDW 15.9* 15.7* 15.7* 15.8*    Chemistries   Recent Labs Lab 05/14/16 0525 05/18/16 1216 05/19/16 0027  NA 135 132* 133*  K 4.1 4.2 4.0  CL 107 104 103  CO2 24 22 24   GLUCOSE 99 93 105*  BUN 22* 14 15  CREATININE 0.79 0.76 0.83  CALCIUM 7.8* 8.1* 8.2*  MG  --  2.0  --   AST  --   --  55*  ALT  --   --  25  ALKPHOS  --   --  175*  BILITOT  --   --  0.8   ------------------------------------------------------------------------------------------------------------------  Recent Labs  05/18/16 0747  CHOL 81  HDL 19*  LDLCALC 51  TRIG 57  CHOLHDL 4.3    Lab Results  Component Value Date   HGBA1C 5.9 (H) 05/18/2016   ------------------------------------------------------------------------------------------------------------------ No results for input(s): TSH, T4TOTAL, T3FREE, THYROIDAB in  the last 72 hours.  Invalid input(s): FREET3 ------------------------------------------------------------------------------------------------------------------ No results for input(s): VITAMINB12, FOLATE, FERRITIN, TIBC, IRON, RETICCTPCT in the last 72 hours.  Coagulation profile No results for input(s): INR, PROTIME in the last 168 hours.  No results for input(s): DDIMER in the last 72 hours.  Cardiac Enzymes No results for input(s): CKMB, TROPONINI, MYOGLOBIN in the last 168 hours.  Invalid input(s): CK ------------------------------------------------------------------------------------------------------------------ No results found for: BNP   Shakinah Navis M.D on 05/20/2016 at 6:45 PM  Between 7am to 7pm - Pager - [phone]  After 7pm go to [email or web address] - password [facility]  [facility] -  Office  [phone]  Dragon dictation system was used to create this note, attempts have been made  to correct errors, however presence of uncorrected errors is not a reflection quality of care provided

## 2016-05-20 NOTE — Telephone Encounter (Signed)
I tried calling the wife again, unable to leave a message.

## 2016-05-20 NOTE — Progress Notes (Signed)
MBSS complete. Full report located under chart review in imaging section.     CHL IP CLINICAL IMPRESSIONS 05/20/2016  Therapy Diagnosis Mild oral phase dysphagia;Mild pharyngeal phase dysphagia;Moderate pharyngeal phase dysphagia  Clinical Impression Mild oral and mile-moderate pharyngeal dysphagia marked by lingual residue post swallow, decreased laryngeal elevation and closure resulting in silent aspiration during the swallow with thin. Mild vallecular and pyriform sinus residue from base of tongue weakness and decreased laryngeal elevation. Pt exhiibted decreased endurance and fatigue throughout assessment. Recommended pt continue with nectar and Dys 3 (due to oral mastication and transit delay), no straws, swallow twice intermittently, full supervision and meds whole in applesauce. Continue to follow.   Impact on safety and function Moderate aspiration risk     Houston Siren M.Ed [employer] [phone]

## 2016-05-20 NOTE — Telephone Encounter (Signed)
Pt's wife returned Dr Viona Gilmore call.

## 2016-05-20 NOTE — Progress Notes (Signed)
Daily Progress Note   Patient Name: [name]       Date: 05/20/2016 DOB: [birth year]  Age: 82 y.o. MRN#: [id number] Attending Physician: Roxan Hockey, MD Primary Care Physician: [name], MD Admit Date: 05/12/2016  Reason for Consultation/Follow-up: Establishing goals of care  Subjective: Patient is more drowsy this afternoon. He wakes to voice but not answering all questions. He is oriented to name but does not know the time or that he is at the hospital. Berneta Sages, at bedside attempting to wake him enough to feed him. He states he is not hungry. Patient denies pain or discomfort. Code status not clear when speaking with wife, Marcia Brash. Spoke with [name] about code status. She agrees that the patient would not wish to have a feeding tube or mechanical ventilation but that wife did wish for CPR to be attempted if necessary. [name] will talk with twin sister, Vicente Males, and wife, Marcia Brash, about DNR status and update the team tomorrow.   Length of Stay: 6  Current Medications: Scheduled Meds:  . antiseptic oral rinse  7 mL Mouth Rinse BID  . famotidine  20 mg Oral Daily  . guaiFENesin-dextromethorphan  10 mL Oral TID  . metoprolol succinate  50 mg Oral Daily  . multivitamin with minerals  1 tablet Oral Daily  . mycophenolate  1,000 mg Oral QHS  . mycophenolate  500 mg Oral Daily  . pyridostigmine  60 mg Oral QID  . sodium chloride flush  3 mL Intravenous Q12H  . tamsulosin  0.4 mg Oral Daily    Continuous Infusions: . heparin 1,500 Units/hr (05/20/16 0714)    PRN Meds: food thickener, levalbuterol, polyethylene glycol, RESOURCE THICKENUP CLEAR, senna  Physical Exam  Constitutional: He is easily aroused. He appears ill.  Drowsy. Responds to verbal stimuli.     Cardiovascular: Regular rhythm.   Pulmonary/Chest: Accessory muscle usage present. He has decreased breath sounds.  Abdominal: Soft. Bowel sounds are normal. There is no tenderness.  Musculoskeletal: He exhibits edema.  BLE R>L  Neurological: He is easily aroused. He is disoriented. GCS eye subscore is 3. GCS verbal subscore is 4. GCS motor subscore is 4.  Skin: Skin is warm and dry.  Psychiatric: His speech is delayed. He is slowed. Cognition and memory are impaired.  Nursing note and vitals reviewed.  Vital Signs: BP (!) 114/57 (BP Location: Left Arm)   Pulse 76   Temp 98.1 F (36.7 C) (Oral)   Resp 18   Ht 5\' 10"  (1.778 m)   Wt 109.5 kg (241 lb 8 oz)   SpO2 93%   BMI 34.65 kg/m  SpO2: SpO2: 93 % O2 Device: O2 Device: Not Delivered O2 Flow Rate: O2 Flow Rate (L/min): 2 L/min  Intake/output summary:   Intake/Output Summary (Last 24 hours) at 05/20/16 1540 Last data filed at 05/20/16 0645  Gross per 24 hour  Intake              480 ml  Output              500 ml  Net              -20 ml   LBM: Last BM Date: 05/17/16 Baseline Weight: Weight: 106.5 kg (234 lb 12.6 oz) Most recent weight: Weight: 109.5 kg (241 lb 8 oz)       Palliative Assessment/Data: PPS 30%   Flowsheet Rows   Flowsheet Row Most Recent Value  Intake Tab  Referral Department  Hospitalist  Unit at Time of Referral  Cardiac/Telemetry Unit  Palliative Care Primary Diagnosis  Neurology  Date Notified  05/17/16  Palliative Care Type  New Palliative care  Reason for referral  Clarify Goals of Care  Date of Admission  05/12/16  Date first seen by Palliative Care  05/18/16  # of days Palliative referral response time  1 Day(s)  # of days IP prior to Palliative referral  5  Clinical Assessment  Palliative Performance Scale Score  30%  Psychosocial & Spiritual Assessment  Social Work Plan of Care  Clarified patient/family wishes with healthcare team, Advance care planning  Palliative Care  Outcomes  Patient/Family meeting held?  Yes  Who was at the meeting?  Sister [name]) and wife Marcia Brash)  Palliative Care Outcomes  Clarified goals of care, Provided advance care planning, Provided psychosocial or spiritual support  Patient/Family wishes: Interventions discontinued/not started   Tube feedings/TPN, PEG  Palliative Care follow-up planned  Yes, Facility      Patient Active Problem List   Diagnosis Date Noted  . Acute deep vein thrombosis (DVT) of right lower extremity ([place]) 05/18/2016  . Palliative care encounter   . DNR (do not resuscitate) discussion   . Goals of care, counseling/discussion   . Cerebrovascular accident (CVA) due to embolism of cerebral artery ([place])   . Acute thromboembolic cerebrovascular accident (CVA) ([place]) 05/17/2016  . Metastatic cancer ([place]) 05/17/2016  . Demand ischemia ([place]) 05/14/2016  . HCAP (healthcare-associated pneumonia) 05/13/2016  . 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 Portal vein thrombosis 05/13/2016  . Sinus tachycardia ([place]) 05/12/2016  . Abdominal pain 05/04/2016  . GI bleed 05/04/2016  . Abdominal mass, LUQ (left upper quadrant) 05/04/2016  . Normocytic anemia 05/04/2016  . CAD (coronary artery disease) 05/04/2016  . SIRS (systemic inflammatory response syndrome) ([place]) 05/04/2016  . Failure to thrive in adult 05/04/2016  . Urinary retention   . Pancreatic lesion 04/20/2016  . Recurrent UTI 04/20/2016  . First degree heart block 10/08/2015  . Environmental allergies 09/03/2013  . Hyperlipidemia with target LDL less than 70 07/25/2013  . Hx of unilateral nephrectomy 07/25/2013  . Failed total knee, right ([place]) 03/31/2012  . ABDOMINAL PAIN, ACUTE 04/16/2010  . ABDOMINAL PAIN, LOWER 04/16/2010  . COUGH 12/11/2009  . VIRAL GASTROENTERITIS 10/27/2009  . SKIN LESIONS, MULTIPLE 10/27/2009  .  DEGENERATIVE JOINT DISEASE 10/27/2009  . RIB PAIN, RIGHT SIDED 08/13/2009  . TINEA CRURIS 06/11/2009  . UNSPECIFIED HEARING LOSS  06/11/2009  . LIPOMA 10/26/2007  . LEG CRAMPS 10/26/2007  . BPH (benign prostatic hyperplasia) 07/18/2007  . Personal history of renal cell carcinoma 03/27/2007  . MYASTHENIA GRAVIS W/O (ACUTE) EXACERBATION 03/27/2007  . Essential hypertension 03/27/2007  . Coronary atherosclerosis 03/27/2007  . GERD 03/27/2007  . COLONIC POLYPS, BENIGN, HX OF 03/27/2007  . DIVERTICULITIS, HX OF 03/27/2007    Palliative Care Assessment & Plan   Patient Profile: 82 y.o.malewith past medical history of osteoarthritis, BPH, renal carcinoma, chronic kidney disease, coronary artery disease, diverticulitis, GERD, hypertension, myasthenia gravis, obesity, partial SBO, PE, and recent UTI. He wasadmitted on 8/9/2017after developing sinus tachycardia during a LUQ biopsy at interventional radiology at [facility]. He was sent to the [facility] ED for further workup. Admitted for HCAP and started on antibiotics. Patient remained somnolent, therefore Mestinon was increased for his myasthenia gravis. No change in mental status after increase in medication. MRI ordered and results showed large acute left PCA infarct with petechial hemorrhage and small acute embolic infarcts. Patient was transferred to [facility] for stroke workup. Echocardiogram, carotid ultrasound, and BLE venous dopplers completed. The patient is positive for a RLE DVT and started on heparin. Biopsy showed poorly differentiated carcinoma, ? Renal etiology with history of renal carcinoma/left nephrectomy. Neuro recommending SNF and have signed off. PT has attempted to work with patient but unable to ambulate due to heparin infusion and the patient not yet reaching therapeutic levels. The patient does have a modified barium swallow test scheduled for today.   Assessment: Patient drowsy but easily arousable with verbal stimuli. Confused. Only oriented to name.   Recommendations/Plan:  Continue PT/SLP.   Code status changed to limited. CPR and Bipap  only.   Will f/u with sisters [name] or Vicente Males) tomorrow regarding code status and making him a DNR.   Disposition unsure. Will need SNF with palliative services or if patient continues to decline, he may qualify for home hospice services.   Palliative Medicine Team will continue to follow and give recommendations as needed.   Goals of Care and Additional Recommendations:  Limitations on Scope of Treatment: Full Scope Treatment  Code Status: Limited code: CPR and Bipap only   Code Status Orders        Start     Ordered   05/20/16 1243  Limited resuscitation (code)  Continuous    Question Answer Comment  In the event of cardiac or respiratory ARREST: Initiate Code Blue, Call Rapid Response Yes   In the event of cardiac or respiratory ARREST: Perform CPR Yes   In the event of cardiac or respiratory ARREST: Perform Intubation/Mechanical Ventilation No   In the event of cardiac or respiratory ARREST: Use NIPPV/BiPAp only if indicated Yes   In the event of cardiac or respiratory ARREST: Administer ACLS medications if indicated No   In the event of cardiac or respiratory ARREST: Perform Defibrillation or Cardioversion if indicated No      05/20/16 1243    Code Status History    Date Active Date Inactive Code Status Order ID Comments User Context   05/20/2016 12:31 PM 05/20/2016 12:43 PM DNR [id number]  Melton Alar, PA-C Inpatient   05/12/2016  9:34 PM 05/20/2016 12:31 PM Full Code [id number]  Reubin Milan, MD Inpatient   05/04/2016  8:06 PM 05/06/2016  2:37 PM Full Code [id number]  Ilene Qua  Opyd, MD ED       Prognosis:   < 3 months-could be significantly less if mental status continues to decline and patient is not eating.   Discharge Planning:  To Be Determined-SNF with palliative or hospice services. Could be hospice appropriate if patient continues to decline.   Care plan was discussed with RN and sister, [name].   Thank you for allowing the Palliative Medicine Team  to assist in the care of this patient.   Time In: 1215 Time Out: 1240 Total Time 26min Prolonged Time Billed  no       Greater than 50%  of this time was spent counseling and coordinating care related to the above assessment and plan.  Basilio Cairo, NP  Please contact Palliative Medicine Team phone at [phone] for questions and concerns.

## 2016-05-21 DIAGNOSIS — Z66 Do not resuscitate: Secondary | ICD-10-CM

## 2016-05-21 LAB — CBC
HCT: 29.1 % — ABNORMAL LOW (ref 39.0–52.0)
HEMOGLOBIN: 9.6 g/dL — AB (ref 13.0–17.0)
MCH: 29.6 pg (ref 26.0–34.0)
MCHC: 33 g/dL (ref 30.0–36.0)
MCV: 89.8 fL (ref 78.0–100.0)
PLATELETS: 297 10*3/uL (ref 150–400)
RBC: 3.24 MIL/uL — ABNORMAL LOW (ref 4.22–5.81)
RDW: 15.8 % — ABNORMAL HIGH (ref 11.5–15.5)
WBC: 6.5 10*3/uL (ref 4.0–10.5)

## 2016-05-21 LAB — HEPARIN LEVEL (UNFRACTIONATED): Heparin Unfractionated: 0.35 IU/mL (ref 0.30–0.70)

## 2016-05-21 MED ORDER — SODIUM CHLORIDE 0.9 % IV SOLN
INTRAVENOUS | Status: DC
Start: 2016-05-21 — End: 2016-05-25
  Administered 2016-05-21 (×2): via INTRAVENOUS
  Administered 2016-05-24: 1000 mL via INTRAVENOUS
  Administered 2016-05-24: 06:00:00 via INTRAVENOUS

## 2016-05-21 NOTE — Telephone Encounter (Signed)
I called and talked with the wife. I discussed the salts of the MRI with her, I believe that the underlying cancer is the likely etiology of the entire issue, it is felt that the patient may have a hypercoagulable state. Also, cancer is the issue that is likely to determine his overall prognosis.

## 2016-05-21 NOTE — Progress Notes (Signed)
Daily Progress Note   Patient Name: [name]       Date: 05/21/2016 DOB: [birth year]  Age: 82 y.o. MRN#: [id number] Attending Physician: Roxan Hockey, MD Primary Care Physician: [name], MD Admit Date: 05/12/2016  Reason for Consultation/Follow-up: Establishing goals of care  Subjective: Patient resting upon arrival to room. Appears more responsive then yesterday. Responds to voice. Patient answering all questions and responds to commands. He does know he is at a hospital this morning. He does not remember what brought him here. Patient tells me he did eat this morning but is "not very hungry." Denies pain or discomfort.   Spoke with sister, [name], this afternoon. She has talked with wife, Marcia Brash, about code status and they have agreed that the patient should be a DNR. [name] states she is going to [facility] this weekend to see if this would be a good facility for him, but overall she does not think he will "get better from this."   Length of Stay: 7  Current Medications: Scheduled Meds:  . antiseptic oral rinse  7 mL Mouth Rinse BID  . famotidine  20 mg Oral Daily  . guaiFENesin-dextromethorphan  10 mL Oral TID  . metoprolol succinate  50 mg Oral Daily  . multivitamin with minerals  1 tablet Oral Daily  . mycophenolate  1,000 mg Oral QHS  . mycophenolate  500 mg Oral Daily  . pyridostigmine  60 mg Oral QID  . sodium chloride flush  3 mL Intravenous Q12H  . tamsulosin  0.4 mg Oral Daily    Continuous Infusions: . heparin 1,500 Units/hr (05/21/16 0032)    PRN Meds: food thickener, levalbuterol, polyethylene glycol, RESOURCE THICKENUP CLEAR, senna  Physical Exam  Constitutional: He is cooperative. He is easily aroused.  Responds to voice and following all  commands.   Cardiovascular: Regular rhythm and normal heart sounds.   Pulmonary/Chest: Effort normal. No accessory muscle usage. No tachypnea. No respiratory distress. He has decreased breath sounds.  Abdominal: Soft. Bowel sounds are normal. There is no tenderness.  Neurological: He is easily aroused. GCS eye subscore is 3. GCS verbal subscore is 4. GCS motor subscore is 6.  Psychiatric: He has a normal mood and affect. His behavior is normal. Cognition and memory are impaired.  Nursing note and vitals reviewed.  Vital Signs: BP (!) 114/48 (BP Location: Left Arm)   Pulse 67   Temp 97.8 F (36.6 C) (Oral)   Resp 18   Ht 5\' 10"  (1.778 m)   Wt 109.5 kg (241 lb 8 oz)   SpO2 93%   BMI 34.65 kg/m  SpO2: SpO2: 93 % O2 Device: O2 Device: Not Delivered O2 Flow Rate: O2 Flow Rate (L/min): 2 L/min  Intake/output summary: No intake or output data in the 24 hours ending 05/21/16 1002 LBM: Last BM Date: 05/17/16 Baseline Weight: Weight: 106.5 kg (234 lb 12.6 oz) Most recent weight: Weight: 109.5 kg (241 lb 8 oz)       Palliative Assessment/Data: PPS 30%   Flowsheet Rows   Flowsheet Row Most Recent Value  Intake Tab  Referral Department  Hospitalist  Unit at Time of Referral  Cardiac/Telemetry Unit  Palliative Care Primary Diagnosis  Neurology  Date Notified  05/17/16  Palliative Care Type  New Palliative care  Reason for referral  Clarify Goals of Care  Date of Admission  05/12/16  Date first seen by Palliative Care  05/18/16  # of days Palliative referral response time  1 Day(s)  # of days IP prior to Palliative referral  5  Clinical Assessment  Palliative Performance Scale Score  30%  Psychosocial & Spiritual Assessment  Social Work Plan of Care  Clarified patient/family wishes with healthcare team, Advance care planning  Palliative Care Outcomes  Patient/Family meeting held?  Yes  Who was at the meeting?  Sister [name]) and wife Marcia Brash)  Palliative Care  Outcomes  Clarified goals of care, Provided advance care planning, Provided psychosocial or spiritual support  Patient/Family wishes: Interventions discontinued/not started   Tube feedings/TPN, PEG  Palliative Care follow-up planned  Yes, Facility      Patient Active Problem List   Diagnosis Date Noted  . Acute deep vein thrombosis (DVT) of right lower extremity ([place]) 05/18/2016  . Palliative care encounter   . DNR (do not resuscitate) discussion   . Goals of care, counseling/discussion   . Cerebrovascular accident (CVA) due to embolism of cerebral artery ([place])   . Acute thromboembolic cerebrovascular accident (CVA) ([place]) 05/17/2016  . Metastatic cancer ([place]) 05/17/2016  . Demand ischemia ([place]) 05/14/2016  . HCAP (healthcare-associated pneumonia) 05/13/2016  . 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 Portal vein thrombosis 05/13/2016  . Sinus tachycardia ([place]) 05/12/2016  . Abdominal pain 05/04/2016  . GI bleed 05/04/2016  . Abdominal mass, LUQ (left upper quadrant) 05/04/2016  . Normocytic anemia 05/04/2016  . CAD (coronary artery disease) 05/04/2016  . SIRS (systemic inflammatory response syndrome) ([place]) 05/04/2016  . Failure to thrive in adult 05/04/2016  . Urinary retention   . Pancreatic lesion 04/20/2016  . Recurrent UTI 04/20/2016  . First degree heart block 10/08/2015  . Environmental allergies 09/03/2013  . Hyperlipidemia with target LDL less than 70 07/25/2013  . Hx of unilateral nephrectomy 07/25/2013  . Failed total knee, right ([place]) 03/31/2012  . ABDOMINAL PAIN, ACUTE 04/16/2010  . ABDOMINAL PAIN, LOWER 04/16/2010  . COUGH 12/11/2009  . VIRAL GASTROENTERITIS 10/27/2009  . SKIN LESIONS, MULTIPLE 10/27/2009  . DEGENERATIVE JOINT DISEASE 10/27/2009  . RIB PAIN, RIGHT SIDED 08/13/2009  . TINEA CRURIS 06/11/2009  . UNSPECIFIED HEARING LOSS 06/11/2009  . LIPOMA 10/26/2007  . LEG CRAMPS 10/26/2007  . BPH (benign prostatic hyperplasia) 07/18/2007  . Personal history  of renal cell carcinoma 03/27/2007  . MYASTHENIA GRAVIS W/O (ACUTE) EXACERBATION 03/27/2007  . Essential hypertension 03/27/2007  .  Coronary atherosclerosis 03/27/2007  . GERD 03/27/2007  . COLONIC POLYPS, BENIGN, HX OF 03/27/2007  . DIVERTICULITIS, HX OF 03/27/2007    Palliative Care Assessment & Plan   Patient Profile: 82 y.o.malewith past medical history of osteoarthritis, BPH, renal carcinoma, chronic kidney disease, coronary artery disease, diverticulitis, GERD, hypertension, myasthenia gravis, obesity, partial SBO, PE, and recent UTI. He wasadmitted on 8/9/2017after developing sinus tachycardia during a LUQ biopsy at interventional radiology at [facility]. He was sent to the [facility] ED for further workup. Admitted for HCAP and started on antibiotics. Patient remained somnolent, therefore Mestinon was increased for his myasthenia gravis. No change in mental status after increase in medication. MRI ordered and results showed large acute left PCA infarct with petechial hemorrhage and small acute embolic infarcts. Patient was transferred to [facility] for stroke workup. Echocardiogram, carotid ultrasound, and BLE venous dopplers completed. The patient is positive for a RLE DVT and started on heparin. Biopsy showed poorly differentiated carcinoma, ? Renal etiology with history of renal carcinoma/left nephrectomy. Neuro recommending SNF and have signed off. PT has attempted to work with patient two days ago but unable to ambulate due to heparin infusion and the patient not yet reaching therapeutic levels. Speech therapy following and recommended dysphagia diet with nectar thick liquids. Patient will most likely be on the heparin drip for 2-3 more days, per attending, and transition to lovenox.   Assessment: Patient is awake and alert x2. Appears comfortable lying in the bed.   Recommendations/Plan:  Continue PT/SLP  DNR status per family  Continue to discuss goals of care and  disposition with family.   Disposition unsure. Will need SNF with palliative services or if patient continue to decline, may qualify for residential hospice.   Palliative Medicine Team will continue to follow and give recommendations as needed.   Goals of Care and Additional Recommendations:  Limitations on Scope of Treatment: Full Scope Treatment  Code Status: DNR   Code Status Orders        Start     Ordered   05/20/16 1243  Limited resuscitation (code)  Continuous    Question Answer Comment  In the event of cardiac or respiratory ARREST: Initiate Code Blue, Call Rapid Response Yes   In the event of cardiac or respiratory ARREST: Perform CPR Yes   In the event of cardiac or respiratory ARREST: Perform Intubation/Mechanical Ventilation No   In the event of cardiac or respiratory ARREST: Use NIPPV/BiPAp only if indicated Yes   In the event of cardiac or respiratory ARREST: Administer ACLS medications if indicated No   In the event of cardiac or respiratory ARREST: Perform Defibrillation or Cardioversion if indicated No      05/20/16 1243    Code Status History    Date Active Date Inactive Code Status Order ID Comments User Context   05/20/2016 12:31 PM 05/20/2016 12:43 PM DNR [id number]  Melton Alar, PA-C Inpatient   05/12/2016  9:34 PM 05/20/2016 12:31 PM Full Code [id number]  Reubin Milan, MD Inpatient   05/04/2016  8:06 PM 05/06/2016  2:37 PM Full Code [id number]  Vianne Bulls, MD ED       Prognosis:   < 3 months-could be significantly less if mental status continues to decline and patient not eating.   Discharge Planning:  To Be Determined-SNF with palliative services or residential hospice if patient declines.   Care plan was discussed with patient   Thank you for allowing the Palliative Medicine Team  to assist in the care of this patient.   Time In: 0940 Time Out: 1010 Total Time 34min Prolonged Time Billed  no       Greater than 50%  of this time was spent  counseling and coordinating care related to the above assessment and plan.  Basilio Cairo, NP  Palliative Medicine Team  Phone: [phone] Fax: [phone]   Please contact Palliative Medicine Team phone at [phone] for questions and concerns.

## 2016-05-21 NOTE — Progress Notes (Signed)
Patient Demographics:    [name], is a 82 y.o. male, DOB - December 27, 1924, [id number]  Admit date - 05/12/2016   Admitting Physician Gerald Milan, MD  Outpatient Primary MD for the patient is Gerald Morale, MD  LOS - 7   Chief Complaint  Patient presents with  . Tachycardia        Subjective:    [name] today has no fevers, no emesis,  No chest pain,  Sleepy at times   Assessment  & Plan :    Principal Problem:   HCAP (healthcare-associated pneumonia) Active Problems:   MYASTHENIA GRAVIS W/O (ACUTE) EXACERBATION   Essential hypertension   GERD   BPH (benign prostatic hyperplasia)   Abdominal pain   Abdominal mass, LUQ (left upper quadrant)   Normocytic anemia   CAD (coronary artery disease)   Sinus tachycardia (HCC)   Splenic vein thrombosis   Portal vein thrombosis   Demand ischemia (HCC)   Acute thromboembolic cerebrovascular accident (CVA) ([name])   Metastatic cancer ([name])   Palliative care encounter   DNR (do not resuscitate) discussion   Goals of care, counseling/discussion   Cerebrovascular accident (CVA) due to embolism of cerebral artery ([name])   Acute deep vein thrombosis (DVT) of right lower extremity ([name])   DNR (do not resuscitate)   1)Acute strokes in both cerebral hemispheres and right cerebellar hemisphere-  suspect Hypercoagulable state secondary to underlying malignancy with possible intracardiac source, neurology service has signed off. Continue IV heparin for DVT for until 05/22/16  and then switch Lovenox  2)Rt LE DVT- continue IV heparin,  See # 1 above  3)intra-abdominal malignancy-axis post previous left nephrectomy for malignancy, ??? If recurrence, final pathology reports pending  4)Other- palliative care consult appreciated to help determining goals of care and CODE STATUS  5)Myasthenia Gravis- Mestinon has been  increased due to generalized  weakness, Fatigue and malaise persist  6)Dispo-significant weakness and debility, patient will most likely need skilled nursing facility for subacute rehabilitation  7)FEN-poor oral intake, swallow eval appreciated, mechanically soft diet with Necktar thickened liquids advised, Ns at 50 ml/hr until oral intake improves to avoid dehydration and AKI  Consults  :  Palliative care and urology   DVT Prophylaxis  :  iv Heparin   Lab Results  Component Value Date   PLT 297 05/21/2016    Inpatient Medications  Scheduled Meds: . antiseptic oral rinse  7 mL Mouth Rinse BID  . famotidine  20 mg Oral Daily  . guaiFENesin-dextromethorphan  10 mL Oral TID  . metoprolol succinate  50 mg Oral Daily  . multivitamin with minerals  1 tablet Oral Daily  . mycophenolate  1,000 mg Oral QHS  . mycophenolate  500 mg Oral Daily  . pyridostigmine  60 mg Oral QID  . sodium chloride flush  3 mL Intravenous Q12H  . tamsulosin  0.4 mg Oral Daily   Continuous Infusions: . sodium chloride    . heparin 1,500 Units/hr (05/21/16 0032)   PRN Meds:.food thickener, levalbuterol, polyethylene glycol, RESOURCE THICKENUP CLEAR, senna    Anti-infectives    Start     Dose/Rate Route Frequency Ordered Stop   05/15/16 0900  vancomycin (VANCOCIN) IVPB 1000 mg/200 mL premix  1,000 mg 200 mL/hr over 60 Minutes Intravenous Every 12 hours 05/15/16 0818 05/19/16 1224   05/13/16 0800  vancomycin (VANCOCIN) IVPB 750 mg/150 ml premix  Status:  Discontinued     750 mg 150 mL/hr over 60 Minutes Intravenous Every 12 hours 05/12/16 2208 05/15/16 0818   05/13/16 0600  ceFEPIme (MAXIPIME) 1 g in dextrose 5 % 50 mL IVPB     1 g 100 mL/hr over 30 Minutes Intravenous Every 8 hours 05/12/16 2134 05/19/16 2202   05/12/16 2300  vancomycin (VANCOCIN) 2,000 mg in sodium chloride 0.9 % 500 mL IVPB     2,000 mg 250 mL/hr over 120 Minutes Intravenous  Once 05/12/16 2208 05/13/16 0230   05/12/16 2230  ceFEPIme (MAXIPIME) 2 g in  dextrose 5 % 50 mL IVPB     2 g 100 mL/hr over 30 Minutes Intravenous  Once 05/12/16 2208 05/13/16 0003   05/12/16 2200  amoxicillin-clavulanate (AUGMENTIN) 250-125 MG per tablet 1 tablet  Status:  Discontinued     1 tablet Oral 3 times daily 05/12/16 2134 05/12/16 2206        Objective:   Vitals:   05/21/16 0154 05/21/16 0544 05/21/16 0855 05/21/16 1358  BP: (!) 120/59 (!) 110/48 (!) 114/48 (!) 102/47  Pulse: 69 69 67 66  Resp: 18 18 18 18   Temp: 97.9 F (36.6 C) 98.1 F (36.7 C) 97.8 F (36.6 C) 97.6 F (36.4 C)  TempSrc: Axillary Oral Oral Oral  SpO2: 93% 91% 93% 94%  Weight:      Height:        Wt Readings from Last 3 Encounters:  05/17/16 109.5 kg (241 lb 8 oz)  05/12/16 106.2 kg (234 lb 3.2 oz)  05/06/16 106.2 kg (234 lb 3.2 oz)    No intake or output data in the 24 hours ending 05/21/16 1732   Physical Exam  Gen:- Awake ,  Sleepy at times HEENT:- Lane.AT, No sclera icterus Neck-Supple Neck,No JVD,.  Lungs-  CTAB  CV- S1, S2 normal Abd-  +ve B.Sounds, Abd Soft, No tenderness,    Extremity/Skin:-Leg is less swollen NeuroPsych- sleepy at times generalized weakness noted, but no further/additional new neurological deficits  Data Review:   Micro Results Recent Results (from the past 240 hour(s))  Culture, blood (Routine X 2) w Reflex to ID Panel     Status: None (Preliminary result)   Collection Time: 05/18/16 12:30 PM  Result Value Ref Range Status   Specimen Description BLOOD LEFT ARM  Final   Special Requests IN PEDIATRIC BOTTLE 2 CC  Final   Culture NO GROWTH 3 DAYS  Final   Report Status PENDING  Incomplete  Culture, blood (Routine X 2) w Reflex to ID Panel     Status: None (Preliminary result)   Collection Time: 05/18/16  3:26 PM  Result Value Ref Range Status   Specimen Description BLOOD LEFT ANTECUBITAL  Final   Special Requests BOTTLES DRAWN AEROBIC AND ANAEROBIC 5CC EACH  Final   Culture NO GROWTH 3 DAYS  Final   Report Status PENDING   Incomplete    Radiology Reports Ct Abdomen Pelvis Wo Contrast  Result Date: 05/04/2016 CLINICAL DATA:  Constipation for 1 day, difficulty urinating for 1 day, abdominal pain, history GERD, BPH, GERD, small-bowel obstruction, hypertension, pulmonary embolism, myasthenia gravis, renal cell carcinoma, coronary artery disease, former smoker EXAM: CT ABDOMEN AND PELVIS WITHOUT CONTRAST TECHNIQUE: Multidetector CT imaging of the abdomen and pelvis was performed following the standard protocol without  IV contrast. Sagittal and coronal MPR images reconstructed from axial data set. Neither oral nor intravenous contrast were administered for this study. COMPARISON:  CT abdomen 03/03/2016 FINDINGS: Lower chest: Bibasilar atelectasis, increased. Peribronchial thickening particularly LEFT lower lobe. Mild interstitial prominence at lung bases little changed. Hepatobiliary: Minimal gallbladder wall calcification. Gallstone seen previously not definitely visualized on current study. No definite focal hepatic lesions. Pancreas: Slight enlargement of the pancreatic tail with ductal dilatation seen on previous exam less well visualized due the lack of IV contrast. Pancreatic appearance little changed. Spleen: No definite intra splenic abnormalities Adrenals/Urinary Tract: Normal-appearing RIGHT adrenal gland. LEFT adrenal gland not visualized. Normal RIGHT kidney and RIGHT ureter. Foley catheter within decompressed urinary bladder. Bladder wall appears thickened though this may be an artifact from collapsed state. Minimal prostatic enlargement. Post LEFT nephrectomy. Large soft tissue mass LEFT upper quadrant 17.6 x 10.7 x 8.6 cm in size, mixed attenuation with areas of low-attenuation centrally and irregular soft tissue attenuation peripherally. Mass abuts anterior margin of spleen, lateral margin of stomach, and pancreatic tail but epicenter of mass appears external to these structures. Surrounding irregular infiltrative  changes of LEFT upper quadrant fat with increased number of vessels. Overlying hernia of soft tissue and fat screw surgical defect in the LEFT flank abdominal wall without bowel herniation. This could represent lymphoma or potentially recurrent renal carcinoma. Stomach/Bowel: Diverticulosis of sigmoid and descending colon without definite evidence of diverticulitis. Stomach decompressed limiting assessment of wall thickness. Bowel loops otherwise unremarkable. Appendix not definitely visualized. Vascular/Lymphatic: Mesenteric collaterals/varices particularly RIGHT of midline. Atherosclerotic calcifications aorta, iliac arteries, femoral arteries, coronary arteries, celiac artery, and splenic artery. Aorta normal caliber. No adenopathy. Reproductive: N/A Other: No free air or free fluid. Musculoskeletal: Diffuse osseous demineralization. Multiple thoracolumbar compression fractures with prior spinal augmentation procedures at 3 levels. IMPRESSION: Post LEFT nephrectomy. Marked interval increase in size of a soft tissue mass in the LEFT upper quadrant with question central necrosis, now measuring 17.6 x 10.7 x 8.6 cm, favor lymphoma or recurrent renal cell carcinoma ; while this mass abuts the pancreatic tail, lateral wall of stomach, and anterior margin of spleen, the lesion appears to have an epicenter external to these organs. LEFT flank hernia through nephrectomy scar. Chronic enlargement of pancreatic tail, by prior CT consisting of ductal dilatation and mild parenchymal prominence, less well visualized on current noncontrast technique exam. Distal colonic diverticulosis. Aortic atherosclerosis with additional atherosclerotic changes as above. Mesenteric collaterals/varices. Electronically Signed   By: Lavonia Dana M.D.   On: 05/04/2016 17:46   Dg Chest 2 View  Result Date: 05/12/2016 CLINICAL DATA:  82 year old male with weakness and tachycardia. EXAM: CHEST  2 VIEW COMPARISON:  05/04/2016 and prior  radiographs FINDINGS: This is a low volume film. Increased left lower lung opacity may represent atelectasis or airspace disease/ pneumonia. Mild pulmonary vascular congestion is noted. Upper limits normal heart size noted. No pneumothorax or definite pleural effusion noted. No acute bony abnormalities are identified. IMPRESSION: Low volume film with increased left lower lung opacity-question atelectasis versus airspace disease. Mild pulmonary vascular congestion. Electronically Signed   By: Margarette Canada M.D.   On: 05/12/2016 18:05   Dg Chest 2 View  Result Date: 05/04/2016 CLINICAL DATA:  82 year old male with fever. EXAM: CHEST  2 VIEW COMPARISON:  11/22/2015 FINDINGS: Lower lung volumes leading to bronchovascular crowding and accentuation of the heart size. There is tortuosity and atherosclerosis of the thoracic aorta, unchanged from prior allowing for differences in technique. Bibasilar atelectasis,  no confluent airspace disease allowing for low lung volumes. No pleural effusion or pneumothorax. Multiple vertebral compression deformities with kyphoplasty. IMPRESSION: Low lung volumes with bronchovascular crowding and bibasilar atelectasis. No evidence of focal pneumonia on low volume chest. Electronically Signed   By: Jeb Levering M.D.   On: 05/04/2016 19:54   Ct Head Wo Contrast  Result Date: 05/12/2016 CLINICAL DATA:  Weakness. EXAM: CT HEAD WITHOUT CONTRAST TECHNIQUE: Contiguous axial images were obtained from the base of the skull through the vertex without intravenous contrast. COMPARISON:  11/22/2013 FINDINGS: Brain: No evidence of acute infarction, hemorrhage, hydrocephalus, or mass lesion/mass effect. Remote small bilateral cerebellar infarcts which appears stable from previous. Remote right posterior temporal and parietal cortically based infarct. Small remote bilateral occipital and right posterior frontal cortical infarcts. Small-vessel ischemic change in the cerebral white matter. Vascular:  Atherosclerotic calcification. Opacified vessels from recent enhanced abdominal CT. Skull: Negative for fracture or focal lesion. Sinuses/Orbits: Left sphenoid sinusitis is chronic based on prior, with fluid level currently. Bilateral cataract resection Other: None. IMPRESSION: 1. No acute intracranial finding or change compared to 2015. 2. Numerous remote infarcts as described above. 3. Chronic left sphenoid sinusitis.  Fluid level is present today. Electronically Signed   By: Monte Fantasia M.D.   On: 05/12/2016 18:18   Ct Chest W Contrast  Result Date: 05/05/2016 CLINICAL DATA:  Renal cancer.  Evaluate for metastatic disease. EXAM: CT CHEST WITH CONTRAST TECHNIQUE: Multidetector CT imaging of the chest was performed during intravenous contrast administration. CONTRAST:  19mL ISOVUE-300 IOPAMIDOL (ISOVUE-300) INJECTION 61% COMPARISON:  11/09/2006 FINDINGS: Cardiovascular: Heart is enlarged. No pericardial effusion. Coronary artery calcification is noted. Mediastinum/Nodes: Scattered upper normal size mediastinal lymph nodes are not substantially changed in the interval. One of the larger lymph nodes is a 10 mm short axis subcarinal node that is unchanged in the interval. There is no hilar lymphadenopathy. The esophagus has normal imaging features. There is no axillary lymphadenopathy. Lungs/Pleura: There is chronic scarring in the left lung base with volume loss and bronchiectasis, progressed in the interval. Superimposed atelectasis or progressive scar noted on today's exam. Superimposed airspace infection cannot be excluded. Volume loss with bronchiectasis noted posterior right lower lobe and in the lingula. No discrete pulmonary nodules to raise significant concern for pulmonary metastatic disease in this patient with a history of renal cell carcinoma. No substantial pleural effusion. Upper Abdomen: Large left upper quadrant heterogeneous soft tissue mass better seen on recent CT scan of the abdomen and  pelvis. Musculoskeletal: Patient is status post multilevel thoracolumbar vertebral augmentation with un treated compression deformity seen at the T11 vertebral body. IMPRESSION: 1. No definite CT findings to suggest metastatic disease to the chest. 2. Bilateral lower lobe, left greater than right, collapse/consolidation with scarring. 3. Coronary artery atherosclerosis. Electronically Signed   By: Misty Stanley M.D.   On: 05/05/2016 10:31   Mr Jeri Cos [id number] Contrast  Result Date: 05/17/2016 CLINICAL DATA:  Slurred speech. EXAM: MRI HEAD WITHOUT AND WITH CONTRAST TECHNIQUE: Multiplanar, multiecho pulse sequences of the brain and surrounding structures were obtained without and with intravenous contrast. CONTRAST:  72mL MULTIHANCE GADOBENATE DIMEGLUMINE 529 MG/ML IV SOLN COMPARISON:  Head CT 05/12/2016 and MRI 02/04/2004 FINDINGS: The study is moderately motion degraded despite repeated imaging and utilizing faster, more motion resistant protocols. There is a large acute left PCA infarct involving the temporal and occipital lobes. A small amount of confluent petechial hemorrhage is present in the anterior left occipital lobe. Small acute infarcts  are present in both cerebral hemispheres, right caudate nucleus, and right frontal, right occipital, and right parietal lobe cortex. There are chronic cortical infarcts involving the right parietal greater than right frontal lobes. Small volume chronic blood products are noted in the right parietal lobe. There are chronic bilateral cerebellar infarcts. T2 hyperintensities elsewhere in the deep cerebral white matter bilaterally are nonspecific but compatible with mild-to-moderate chronic small vessel ischemic disease. There is moderate cerebral atrophy. No mass, midline shift, or extra-axial fluid collection is seen. No abnormal enhancement is identified within limitations of motion. Prior bilateral cataract extraction is noted. Circumferential mucosal thickening and fluid  are present in the left sphenoid sinus. The mastoid air cells are clear. Major intracranial vascular flow voids are grossly preserved with vertebrobasilar and ICA dolichoectasia. IMPRESSION: 1. Large acute left PCA infarct with petechial hemorrhage. 2. Small acute embolic infarcts in the cerebellum and right cerebral hemisphere. 3. Chronic ischemic changes as above. Electronically Signed   By: Logan Bores M.D.   On: 05/17/2016 14:16   Ct Abdomen Pelvis W Contrast  Result Date: 05/12/2016 CLINICAL DATA:  had a biopsy of a left upper quadrant mass today. tachycardia (he was in the 140-150 range after procedure); he arrives here drowsy and in no distress with a heart rate of ~125 and is a regular supraventricular tach. Family are with him also. He is drowsy and is slow to arouse and answers all questions regarding his situation lucidly. He has an indwelling foley cath. In place and a sm. Clean dressing at left lower rib area EXAM: CT ABDOMEN AND PELVIS WITH CONTRAST TECHNIQUE: Multidetector CT imaging of the abdomen and pelvis was performed using the standard protocol following bolus administration of intravenous contrast. CONTRAST:  153mL ISOVUE-300 IOPAMIDOL (ISOVUE-300) INJECTION 61% COMPARISON:  Biopsy images from earlier the same day, and prior studies FINDINGS: Lower chest: Trace bilateral pleural effusions. Consolidation/ atelectasis posteriorly in the visualized lower lobes. Coronary calcifications. Hepatobiliary: Fatty liver without focal lesion. Partially calcified stones in the nondilated gallbladder. Pancreas: Mild atrophy in the mid body and head. There is masslike enlargement of pancreatic tail and a contiguous complex 17.8 cm mass with peripheral enhancement, central low-attenuation, stable compared to scans dating back to 05/04/2016. No evidence of hematoma or active extravasation. Spleen: The left upper quadrant mass abuts the splenic hilum. There are small wedge-shaped areas of decreased  enhancement in the spleen suggesting direct involvement by the mass. Splenic vein thrombosis. Adrenals/Urinary Tract: Previous left nephrectomy. Right adrenal gland and kidney unremarkable. Foley catheter decompresses urinary bladder. Stomach/Bowel: Stomach is decompressed, displaced medially by at the left upper quadrant mass without definite invasion. Small bowel and colon are nondilated. Innumerable sigmoid diverticula without adjacent inflammatory/edematous change. Vascular/Lymphatic: Moderate aortoiliac arterial calcifications without aneurysm or stenosis. Cavernous transformation of the portal vein. Apparent thrombosis of the splenic vein. SMV remains patent. Reproductive: No mass or other significant abnormality. Other: Scattered low-attenuation ascites in the pelvis and pericolic gutters left greater than right. No suggestion of hemoperitoneum. Musculoskeletal: Left lateral body wall hernia containing only fat. Multiple stable lower thoracic and lumbar compression deformities, post vertebral augmentation at T10 and L1. IMPRESSION: 1. Negative for hemorrhage or other acute complication of left upper quadrant mass biopsy. Critical Value/emergent results were called by telephone at the time of interpretation on 05/12/2016 at 5:03 pm to Dr. Laurence Ferrari, who verbally acknowledged these results. 2. Stable left upper quadrant mass abutting the pancreas, probably invading the splenic hilum, with splenic and portal vein thrombosis. 3. Additional  ancillary findings as above, stable since previous exam. Electronically Signed   By: Lucrezia Europe M.D.   On: 05/12/2016 17:04   Ct Biopsy  Result Date: 05/12/2016 INDICATION: 82 year old male with a prior history of renal cell carcinoma and a large left upper quadrant mass concerning for recurrent disease. He presents for biopsy. EXAM: CT BIOPSY MEDICATIONS: None. ANESTHESIA/SEDATION: Moderate (conscious) sedation was employed during this procedure. A total of Versed 1 mg and  Fentanyl 50 mcg was administered intravenously. Moderate Sedation Time: 10 minutes. The patient's level of consciousness and vital signs were monitored continuously by radiology nursing throughout the procedure under my direct supervision. FLUOROSCOPY TIME:  None COMPLICATIONS: None immediate. PROCEDURE: Informed written consent was obtained from the patient after a thorough discussion of the procedural risks, benefits and alternatives. All questions were addressed. A timeout was performed prior to the initiation of the procedure. A planning axial CT scan was performed. The large left upper quadrant abdominal mass was identified. A suitable skin entry site was selected and marked. The region was sterilely prepped and draped in standard fashion using chlorhexidine skin prep. Local anesthesia was attained by infiltration with 1% lidocaine. A small dermatotomy was made. Using intermittent CT guidance, a 19 gauge introducer needle was advanced into the margin of the mass. The introducer needle was positioned to biopsy needle rind of the lesion has centrally it is low attenuation and likely necrotic. Multiple 18 gauge core biopsies were then coaxially obtained using the bio Pince automated biopsy device. Biopsy specimens were placed in saline and delivered to pathology for further analysis. Following removal of the biopsy device and introducer needle, and axial CT scan was performed which demonstrates no hematoma or other complicating feature. The patient tolerated the procedure well. IMPRESSION: Technically successful CT-guided biopsy of left upper quadrant abdominal mass. Of note, the mass is centrally necrotic. Efforts were made to biopsy the peripheral rim of the lesion in hopes of obtaining viable tissue for diagnosis. Electronically Signed   By: Jacqulynn Cadet M.D.   On: 05/12/2016 13:03   Dg Swallowing Func-speech Pathology  Result Date: 05/20/2016 Objective Swallowing Evaluation: Type of Study:  MBS-Modified Barium Swallow Study Patient Details Name: AMAN NEUHALFEN MRN: [id number] Date of Birth: [birth year] Today's Date: 05/20/2016 Time: SLP Start Time (ACUTE ONLY): 1328-SLP Stop Time (ACUTE ONLY): 1345 SLP Time Calculation (min) (ACUTE ONLY): 17 min Past Medical History: Past Medical History: Diagnosis Date . Arthritis   back, hips, knees  . Benign prostatic hypertrophy  . Cancer [facility])   h/o renal carcinoma  . Chronic kidney disease   h/o UTI-2012 . Coronary artery disease   sees Dr. Shelva Majestic  . Diverticulitis  . GERD (gastroesophageal reflux disease)  . H/O epistaxis  . Heart disease  . Hypertension  . Myasthenia gravis [facility])   sees Dr. Jannifer Franklin . Obesity   exogenous . Osteoporosis  . Partial small bowel obstruction ([place]) 04/16/10  resolved with bowel rest . Pulmonary embolus ([place])   2000 . Shortness of breath  . Urinary incontinence  . UTI (urinary tract infection)  Past Surgical History: Past Surgical History: Procedure Laterality Date . ANGIOPLASTY    had 2 stent replacements . CARDIAC CATHETERIZATION    2008, stents  . CARDIAC CATHETERIZATION  2008 . COLONOSCOPY  05/28/10  per Dr. Laurence Spates, diverticulosis, no repeats planned  . DOPPLER ECHOCARDIOGRAPHY   . EYE SURGERY    cataracts(bilateral)  removed, ?iol . JOINT REPLACEMENT    2005-R, L knee replacement- 2000 .  NEPHRECTOMY    left . NM MYOVIEW LTD  02/2011  moderate inferior scar no ischemia . SP KYPHOPLASTY    and vertebroplasty . stented placed  1/08 . stress dipyridamole myocardial perfusion   . TONSILLECTOMY   . TOTAL KNEE ARTHROPLASTY    both Sees Dr. Novella Olive . TOTAL KNEE REVISION  03/28/2012  Procedure: TOTAL KNEE REVISION;  Surgeon: Hessie Dibble, MD;  Location: [facility];  Service: Orthopedics;  Laterality: Right; HPI: 82 yo male adm to [facility] with weakness - recent biopsy for LUQ mass - pt was diagnosed with HCAP.  PMH + for GERD, renal cancer, Myasthenia Gravis on Mestinon.  CT head negative.  CXR left lower lobe opacity.  Swallow evaluation  ordered.    Subjective: pt awake in bed Assessment / Plan / Recommendation CHL IP CLINICAL IMPRESSIONS 05/20/2016 Therapy Diagnosis Mild oral phase dysphagia;Mild pharyngeal phase dysphagia;Moderate pharyngeal phase dysphagia Clinical Impression Mild oral and mile-moderate pharyngeal dysphagia marked by lingual residue post swallow, decreased laryngeal elevation and closure resulting in silent aspiration during the swallow with thin. Mild vallecular and pyriform sinus residue from base of tongue weakness and decreased laryngeal elevation. Pt exhiibted decreased endurance and fatigue throughout assessment. Recommended pt continue with nectar and Dys 3 (due to oral mastication and transit delay), no straws, swallow twice intermittently, full supervision and meds whole in applesauce. Continue to follow.  Impact on safety and function Moderate aspiration risk   CHL IP TREATMENT RECOMMENDATION 05/20/2016 Treatment Recommendations Therapy as outlined in treatment plan below   Prognosis 05/20/2016 Prognosis for Safe Diet Advancement Fair Barriers to Reach Goals -- Barriers/Prognosis Comment -- CHL IP DIET RECOMMENDATION 05/20/2016 SLP Diet Recommendations Dysphagia 3 (Mech soft) solids;Nectar thick liquid Liquid Administration via Cup;No straw Medication Administration Whole meds with puree Compensations Slow rate;Small sips/bites;Multiple dry swallows after each bite/sip Postural Changes Seated upright at 90 degrees   CHL IP OTHER RECOMMENDATIONS 05/20/2016 Recommended Consults -- Oral Care Recommendations Oral care BID Other Recommendations --   CHL IP FOLLOW UP RECOMMENDATIONS 05/20/2016 Follow up Recommendations (No Data)   CHL IP FREQUENCY AND DURATION 05/20/2016 Speech Therapy Frequency (ACUTE ONLY) min 2x/week Treatment Duration 2 weeks      CHL IP ORAL PHASE 05/20/2016 Oral Phase Impaired Oral - Pudding Teaspoon -- Oral - Pudding Cup -- Oral - Honey Teaspoon -- Oral - Honey Cup -- Oral - Nectar Teaspoon -- Oral - Nectar  Cup Lingual/palatal residue Oral - Nectar Straw -- Oral - Thin Teaspoon -- Oral - Thin Cup Lingual/palatal residue Oral - Thin Straw -- Oral - Puree -- Oral - Mech Soft -- Oral - Regular Lingual/palatal residue Oral - Multi-Consistency -- Oral - Pill -- Oral Phase - Comment --  CHL IP PHARYNGEAL PHASE 05/20/2016 Pharyngeal Phase Impaired Pharyngeal- Pudding Teaspoon -- Pharyngeal -- Pharyngeal- Pudding Cup -- Pharyngeal -- Pharyngeal- Honey Teaspoon -- Pharyngeal -- Pharyngeal- Honey Cup -- Pharyngeal -- Pharyngeal- Nectar Teaspoon -- Pharyngeal -- Pharyngeal- Nectar Cup Pharyngeal residue - valleculae;Pharyngeal residue - pyriform;Reduced tongue base retraction;Reduced laryngeal elevation Pharyngeal -- Pharyngeal- Nectar Straw -- Pharyngeal -- Pharyngeal- Thin Teaspoon -- Pharyngeal -- Pharyngeal- Thin Cup Penetration/Aspiration during swallow;Reduced laryngeal elevation;Reduced airway/laryngeal closure Pharyngeal Material enters airway, passes BELOW cords without attempt by patient to eject out (silent aspiration);Material enters airway, remains ABOVE vocal cords then ejected out Pharyngeal- Thin Straw -- Pharyngeal -- Pharyngeal- Puree -- Pharyngeal -- Pharyngeal- Mechanical Soft -- Pharyngeal -- Pharyngeal- Regular Pharyngeal residue - valleculae;Reduced tongue base retraction Pharyngeal -- Pharyngeal- Multi-consistency --  Pharyngeal -- Pharyngeal- Pill -- Pharyngeal -- Pharyngeal Comment --  CHL IP CERVICAL ESOPHAGEAL PHASE 05/20/2016 Cervical Esophageal Phase WFL Pudding Teaspoon -- Pudding Cup -- Honey Teaspoon -- Honey Cup -- Nectar Teaspoon -- Nectar Cup -- Nectar Straw -- Thin Teaspoon -- Thin Cup -- Thin Straw -- Puree -- Mechanical Soft -- Regular -- Multi-consistency -- Pill -- Cervical Esophageal Comment -- CHL IP GO 05/14/2016 Functional Assessment Tool Used (None) Functional Limitations Swallowing Swallow Current Status [id number]) CL Swallow Goal Status [id number]) CK Swallow Discharge Status [id number]) (None)  Motor Speech Current Status [id number]) (None) Motor Speech Goal Status UK:060616) (None) Motor Speech Goal Status SA:931536) (None) Spoken Language Comprehension Current Status [id number]) (None) Spoken Language Comprehension Goal Status [id number]) (None) Spoken Language Comprehension Discharge Status [id number]) (None) Spoken Language Expression Current Status FP:837989) (None) Spoken Language Expression Goal Status [id number]:9098795) (None) Spoken Language Expression Discharge Status [phone]) (None) Attention Current Status [id number]:1732502) (None) Attention Goal Status EY:7266000) (None) Attention Discharge Status [id number]) (None) Memory Current Status YL:3545582) (None) Memory Goal Status CF:3682075) (None) Memory Discharge Status QC:115444) (None) Voice Current Status [id number]) (None) Voice Goal Status EW:8517110) (None) Voice Discharge Status [id number]) (None) Other Speech-Language Pathology Functional Limitation [phone]) (None) Other Speech-Language Pathology Functional Limitation Goal Status [id number]:1448828) (None) Other Speech-Language Pathology Functional Limitation Discharge Status [id number]) (None) Houston Siren 05/20/2016, 2:42 PM Orbie Pyo Colvin Caroli.Ed CCC-SLP Pager [phone]                CBC  Recent Labs Lab 05/18/16 1216 05/19/16 0027 05/20/16 0638 05/21/16 0533  WBC 7.6 7.6 7.9 6.5  HGB 10.4* 10.5* 10.9* 9.6*  HCT 31.9* 31.8* 32.6* 29.1*  PLT 285 313 307 297  MCV 90.9 90.1 90.6 89.8  MCH 29.6 29.7 30.3 29.6  MCHC 32.6 33.0 33.4 33.0  RDW 15.7* 15.7* 15.8* 15.8*    Chemistries   Recent Labs Lab 05/18/16 1216 05/19/16 0027  NA 132* 133*  K 4.2 4.0  CL 104 103  CO2 22 24  GLUCOSE 93 105*  BUN 14 15  CREATININE 0.76 0.83  CALCIUM 8.1* 8.2*  MG 2.0  --   AST  --  55*  ALT  --  25  ALKPHOS  --  175*  BILITOT  --  0.8   ------------------------------------------------------------------------------------------------------------------ No results for input(s): CHOL, HDL, LDLCALC, TRIG, CHOLHDL, LDLDIRECT in the last 72 hours.  Lab Results    Component Value Date   HGBA1C 5.9 (H) 05/18/2016   ------------------------------------------------------------------------------------------------------------------ No results for input(s): TSH, T4TOTAL, T3FREE, THYROIDAB in the last 72 hours.  Invalid input(s): FREET3 ------------------------------------------------------------------------------------------------------------------ No results for input(s): VITAMINB12, FOLATE, FERRITIN, TIBC, IRON, RETICCTPCT in the last 72 hours.  Coagulation profile No results for input(s): INR, PROTIME in the last 168 hours.  No results for input(s): DDIMER in the last 72 hours.  Cardiac Enzymes No results for input(s): CKMB, TROPONINI, MYOGLOBIN in the last 168 hours.  Invalid input(s): CK ------------------------------------------------------------------------------------------------------------------ No results found for: BNP   Shemeca Lukasik M.D on 05/21/2016 at 5:32 PM  Between 7am to 7pm - Pager - [phone]  After 7pm go to [email or web address] - password [email or web address]  [facility] -  Office  [phone]  Dragon dictation system was used to create this note, attempts have been made to correct errors, however presence of uncorrected errors is not a reflection quality of care provided

## 2016-05-21 NOTE — Consult Note (Signed)
   [facility] CM Inpatient Consult   05/21/2016  TASHAUN LISTER 02/10/1925 [id number]  Patient had re-admission screened for potential [place] [facility] services for community follow up. Patient is eligible for [place] with his Medicare [place] registry. Electronic medical record reveals patient's discharge plan is for a skilled facility at this time. Chart review reveals that the patient is a 82 y.o. male with medical history significant of osteoarthritis, BPH, renal carcinoma, chronic kidney disease, coronary artery disease, diverticulitis, GERD, hypertension, myasthenia gravis, obesity, osteoporosis and sinus tachycardia after his LUQ biopsy of a mass.  [facility] Care Management services not appropriate at this time.  Chart review patient has had a palliative consult as well.  If patient's post hospital needs change please place a [facility] Care Management consult. For questions please contact:   Natividad Brood, RN BSN [facility] Liaison  [phone] business mobile phone Toll free office [phone]

## 2016-05-21 NOTE — Progress Notes (Signed)
Physical Therapy Treatment Patient Details Name: [name] MRN: [id number] DOB: 05-Dec-1924 Today's Date: 05/21/2016    History of Present Illness 82 y.o. male with medical history significant of osteoarthritis, BPH, renal carcinoma, chronic kidney disease, coronary artery disease, diverticulitis, GERD, hypertension, myasthenia gravis, obesity, osteoporosis, recently treated for UTI and admitted after pt developed sinus tachycardia in the 140s and 150s after getting a biopsy for LUQ mass with interventional radiology.  Found to have HCAP. and later imaging showed  Large acute left PCA infarct with petechial hemorrhage/small acute embolic infarcts in the cerebellum and right cerebral hemisphere in the setting of chronic ischemic small vessel cerebrovascular disease.  Pt transfered over to [facility]..  Developed DVT in LE and on bedrest for 3 days to await therapeutic heparin levels.    PT Comments    Pt reassessed post stroke and transfer to [facility].  Pt very lethargic, needs increased 2 person assist for bed mobility and transfers.  Shows weakness and incoordination in general  Follow Up Recommendations  SNF;Supervision/Assistance - 24 hour     Equipment Recommendations  Rolling walker with 5" wheels    Recommendations for Other Services       Precautions / Restrictions Precautions Precautions: Fall    Mobility  Bed Mobility Overal bed mobility: Needs Assistance Bed Mobility: Supine to Sit     Supine to sit: Max assist;+2 for physical assistance     General bed mobility comments: assisted legs to EOB, pt not able to bridge.  assisted trunk up to sitting and full assist scoot to EOB  Transfers Overall transfer level: Needs assistance Equipment used: Rolling walker (2 wheeled) Transfers: Sit to/from Stand Sit to Stand: Mod assist;Max assist;+2 physical assistance (3 trials)         General transfer comment: assist to come forward and boost to stand.  pt consistently with  heavy list until both hands on the RW.  Ambulation/Gait Ambulation/Gait assistance: Mod assist;Max assist;+2 physical assistance Ambulation Distance (Feet): 5 Feet (forward with turn to chair.) Assistive device: Rolling walker (2 wheeled) Gait Pattern/deviations: Step-through pattern;Decreased step length - right;Decreased step length - left;Decreased stride length     General Gait Details: assist pt to stay forward. Stability assist while helping with w/shift and stepping.   Stairs            Wheelchair Mobility    Modified Rankin (Stroke Patients Only) Modified Rankin (Stroke Patients Only) Pre-Morbid Rankin Score: No symptoms Modified Rankin: Severe disability     Balance Overall balance assessment: Needs assistance Sitting-balance support: Feet supported Sitting balance-Leahy Scale: Fair     Standing balance support: During functional activity;Bilateral upper extremity supported Standing balance-Leahy Scale: Poor Standing balance comment: standing x3 working on shifting weight forward over his feet.,  pregait with w/shift practice before taking a few steps to the chair.                    Cognition Arousal/Alertness: Lethargic Behavior During Therapy: Flat affect Overall Cognitive Status: Impaired/Different from baseline Area of Impairment: Attention;Following commands;Safety/judgement;Problem solving;Awareness   Current Attention Level: Sustained Memory: Decreased recall of precautions;Decreased short-term memory Following Commands: Follows one step commands with increased time Safety/Judgement: Decreased awareness of safety;Decreased awareness of deficits Awareness: Intellectual Problem Solving: Slow processing      Exercises      General Comments General comments (skin integrity, edema, etc.): pt washed his face and brushed his teeth with cuing and set up assist.  Pt still tended to start  falling asleep after completing each task.       Pertinent Vitals/Pain Pain Assessment: No/denies pain    Home Living                      Prior Function            PT Goals (current goals can now be found in the care plan section) Acute Rehab PT Goals Patient Stated Goal: pt wants to get his tests done and get home PT Goal Formulation: With patient Time For Goal Achievement: 05/28/16 Potential to Achieve Goals: Fair Progress towards PT goals: Progressing toward goals    Frequency  Min 3X/week    PT Plan Current plan remains appropriate    Co-evaluation             End of Session   Activity Tolerance: Patient limited by fatigue Patient left: in chair;with call bell/phone within reach;with chair alarm set     Time: [id number] PT Time Calculation (min) (ACUTE ONLY): 34 min  Charges:  $Therapeutic Activity: 23-37 mins                    G Codes:      Tanice Petre, Tessie Fass 05/21/2016, 6:19 PM 05/21/2016  Donnella Sham, PT [phone]  (pager)

## 2016-05-21 NOTE — Progress Notes (Signed)
Speech Language Pathology Treatment: Dysphagia  Patient Details Name: [name] MRN: [id number] DOB: Nov 04, 1924 Today's Date: 05/21/2016 Time: [id number] SLP Time Calculation (min) (ACUTE ONLY): 10 min  Assessment / Plan / Recommendation Clinical Impression  Pt required max-total encouragement to consume one bite eggs, one bite muffin and 3 sips nectar thick juice. Oral and pharyngeal phases functional for limited intake. Encouraged pt to try to consume increased intake at lunch. ST will continue.    HPI HPI: 82 yo male adm to [facility] with weakness - recent biopsy for LUQ mass - pt was diagnosed with HCAP.  PMH + for GERD, renal cancer, Myasthenia Gravis on Mestinon.  CT head negative.  CXR left lower lobe opacity.  Swallow evaluation ordered.         SLP Plan  Continue with current plan of care     Recommendations  Diet recommendations: Dysphagia 3 (mechanical soft);Nectar-thick liquid Liquids provided via: Cup;No straw Medication Administration: Whole meds with puree Supervision: Full supervision/cueing for compensatory strategies Compensations: Slow rate;Small sips/bites;Multiple dry swallows after each bite/sip Postural Changes and/or Swallow Maneuvers: Seated upright 90 degrees;Upright 30-60 min after meal             Oral Care Recommendations: Oral care BID Follow up Recommendations:  (TBD) Plan: Continue with current plan of care     GO                Houston Siren 05/21/2016, 8:47 AM    Orbie Pyo Colvin Caroli.Ed [employer] [phone]

## 2016-05-21 NOTE — Progress Notes (Signed)
ANTICOAGULATION CONSULT NOTE - Follow Up Consult  Pharmacy Consult for Heparin  Indication: DVT   Allergies  Allergen Reactions  . Dicyclomine Other (See Comments)    REACTION: can worsen myasthenia gravis    Patient Measurements: Height: 5\' 10"  (177.8 cm) Weight: 241 lb 8 oz (109.5 kg) IBW/kg (Calculated) : 73  Heparin Dosing Weight: 97 kg  Vital Signs: Temp: 97.8 F (36.6 C) (08/18 0855) Temp Source: Oral (08/18 0855) BP: 114/48 (08/18 0855) Pulse Rate: 67 (08/18 0855)  Labs:  Recent Labs  05/18/16 1216 05/19/16 0027  05/19/16 1903 05/20/16 0638 05/21/16 0533  HGB 10.4* 10.5*  --   --  10.9* 9.6*  HCT 31.9* 31.8*  --   --  32.6* 29.1*  PLT 285 313  --   --  307 297  HEPARINUNFRC  --  0.12*  < > 0.47 0.48 0.35  CREATININE 0.76 0.83  --   --   --   --   < > = values in this interval not displayed.  Estimated Creatinine Clearance: 76.2 mL/min (by C-G formula based on SCr of 0.83 mg/dL).  Assessment: New DVT in setting of acute stroke, initial heparin level is sub-therapeutic, no issues per RN.   This morning's HL remains therapeutic at 0.35 on heparin 1500 units/hr. No issues with infusion or bleeding noted.  Goal of Therapy:  Heparin level 0.3-0.5 units/ml, in setting of acute stroke Monitor platelets by anticoagulation protocol: Yes   Plan:  Continue heparin 1500 units/hr Daily HL Monitor s/sx of bleeding   [name]. Diona Foley, PharmD, BCPS Clinical Pharmacist Pager [phone] 05/21/2016,10:20 AM

## 2016-05-22 LAB — CBC
HCT: 31.8 % — ABNORMAL LOW (ref 39.0–52.0)
Hemoglobin: 10.5 g/dL — ABNORMAL LOW (ref 13.0–17.0)
MCH: 29.5 pg (ref 26.0–34.0)
MCHC: 33 g/dL (ref 30.0–36.0)
MCV: 89.3 fL (ref 78.0–100.0)
PLATELETS: 308 10*3/uL (ref 150–400)
RBC: 3.56 MIL/uL — AB (ref 4.22–5.81)
RDW: 15.9 % — ABNORMAL HIGH (ref 11.5–15.5)
WBC: 7.4 10*3/uL (ref 4.0–10.5)

## 2016-05-22 LAB — BASIC METABOLIC PANEL
Anion gap: 7 (ref 5–15)
BUN: 19 mg/dL (ref 6–20)
CHLORIDE: 102 mmol/L (ref 101–111)
CO2: 23 mmol/L (ref 22–32)
CREATININE: 0.82 mg/dL (ref 0.61–1.24)
Calcium: 8.2 mg/dL — ABNORMAL LOW (ref 8.9–10.3)
GFR calc Af Amer: >60 mL/min (ref >60)
GLUCOSE: 94 mg/dL (ref 65–99)
POTASSIUM: 4 mmol/L (ref 3.5–5.1)
SODIUM: 132 mmol/L — AB (ref 135–145)

## 2016-05-22 LAB — HEPARIN LEVEL (UNFRACTIONATED)
HEPARIN UNFRACTIONATED: 0.47 [IU]/mL (ref 0.30–0.70)
Heparin Unfractionated: 0.54 IU/mL (ref 0.30–0.70)

## 2016-05-22 MED ORDER — HEPARIN (PORCINE) IN NACL 100-0.45 UNIT/ML-% IJ SOLN
1400.0000 [IU]/h | INTRAMUSCULAR | Status: DC
  Administered 2016-05-22: 1400 [IU]/h via INTRAVENOUS
  Filled 2016-05-22: qty 250

## 2016-05-22 MED ORDER — ENOXAPARIN SODIUM 120 MG/0.8ML ~~LOC~~ SOLN
1.0000 mg/kg | Freq: Two times a day (BID) | SUBCUTANEOUS | Status: DC
  Administered 2016-05-22 – 2016-05-25 (×6): 110 mg via SUBCUTANEOUS
  Filled 2016-05-22 (×8): qty 0.73

## 2016-05-22 NOTE — Progress Notes (Signed)
Patient Demographics:    [name], is a 82 y.o. male, DOB - 02-25-1925, [id number]  Admit date - 05/12/2016   Admitting Physician Reubin Milan, MD  Outpatient Primary MD for the patient is [name], MD  LOS - 8   Chief Complaint  Patient presents with  . Tachycardia        Subjective:    [name] today has no fevers, no emesis,  No chest pain,  Without new complaints, oral intake remains challenging, appetite remains poor   Assessment  & Plan :    Principal Problem:   HCAP (healthcare-associated pneumonia) Active Problems:   MYASTHENIA GRAVIS W/O (ACUTE) EXACERBATION   Essential hypertension   GERD   BPH (benign prostatic hyperplasia)   Abdominal pain   Abdominal mass, LUQ (left upper quadrant)   Normocytic anemia   CAD (coronary artery disease)   Sinus tachycardia (HCC)   Splenic vein thrombosis   Portal vein thrombosis   Demand ischemia ([place])   Acute thromboembolic cerebrovascular accident (CVA) ([place])   Metastatic cancer ([place])   Palliative care encounter   DNR (do not resuscitate) discussion   Goals of care, counseling/discussion   Cerebrovascular accident (CVA) due to embolism of cerebral artery ([place])   Acute deep vein thrombosis (DVT) of right lower extremity ([place])   DNR (do not resuscitate)   Brief Narrative:  [name] an 82 y.o.malewith medical history significant of osteoarthritis, BPH, renal carcinoma, chronic kidney disease, coronary artery disease, diverticulitis, GERD, hypertension, myasthenia gravis, obesity, osteoporosis, recently treated for UTI, who was referred by interventional radiology after he was discovered to be tachycardic with heart rate in the 140s-150s after undergoing a biopsy to evaluate a left upper quadrant mass. In the ED, he was hydrated and given 50 mg of metoprolol with improvement of his heart rate into the 80s. He had  mild elevated troponin at 0.11. Chest x-ray showed a left lower lobe infiltrate versus atelectasis.He was admitted and treated for HCAP. Over the course of his hospitalization, the patient continued to be lethargic and was initially thought to have a myasthenia gravis flare. He did not respond to a increase in his dose of Mestinon and further imaging was therefore obtained with an MRI of his brain which showed a large acute left PCA infarct as well as acute embolic infarcts in the cerebellum and right cerebral hemisphere. 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 The tissue is scant, but reveals malignant cells with focal areas suggestive of papillary features. Immunohistochemistry reveals the cells are positive for pancytokeratin, cytokeratin 8/18, and cytokeratin 7. There is weak focal PAX-8 staining. Cytokeratin 20, PSA, prostein, TTF-1, CD10, and CDX-2 are negative. Given the history and the weak PAX-8 staining a papillary renal cell carcinoma is slightly favored.   1)Acute strokes in both cerebral hemispheres and right cerebellar hemisphere- also has DVT, suspectHypercoagulable state secondary to underlying malignancy with possible intracardiac source, neurology service has signed off. Patient was treated with  IV heparin for DVT for until 05/22/16  and then switched Lovenox.   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   4)Other- palliative care consult appreciated to help determining  goals of care and CODE STATUS  5)Myasthenia Gravis- Mestinon has been increased due to generalized weakness, Fatigue and malaise persist  6)Dispo-significant weakness and debility, patient will most likely need skilled nursing facility for subacute rehabilitation, continue physical therapy as tolerated  7)FEN-poor oral intake, swallow eval appreciated, mechanically soft diet with [place]cktar thickened liquids advised, Ns at 50 ml/hr until oral intake improves to avoid dehydration and AKI  Code Status : DNR   Disposition Plan  : SNF now the patient is off IV heparin without any bleeding, maybe discharged to skilled nursing facility on Lovenox  Consults  :  Neurology/palliative   DVT Prophylaxis  :  Lovenox in therapeutic doses for acute DVT  Lab Results  Component Value Date   PLT 308 05/22/2016    Inpatient Medications  Scheduled Meds: . antiseptic oral rinse  7 mL Mouth Rinse BID  . enoxaparin (LOVENOX) injection  1 mg/kg Subcutaneous Q12H  . famotidine  20 mg Oral Daily  . guaiFENesin-dextromethorphan  10 mL Oral TID  . metoprolol succinate  50 mg Oral Daily  . multivitamin with minerals  1 tablet Oral Daily  . mycophenolate  1,000 mg Oral QHS  . mycophenolate  500 mg Oral Daily  . pyridostigmine  60 mg Oral QID  . sodium chloride flush  3 mL Intravenous Q12H  . tamsulosin  0.4 mg Oral Daily   Continuous Infusions: . sodium chloride 50 mL/hr at 05/21/16 2321   PRN Meds:.food thickener, levalbuterol, polyethylene glycol, RESOURCE THICKENUP CLEAR, senna    Anti-infectives    Start     Dose/Rate Route Frequency Ordered Stop   05/15/16 0900  vancomycin (VANCOCIN) IVPB 1000 mg/200 mL premix     1,000 mg 200 mL/hr over 60 Minutes Intravenous Every 12 hours 05/15/16 0818 05/19/16 1224   05/13/16 0800  vancomycin (VANCOCIN) IVPB 750 mg/150 ml premix  Status:  Discontinued     750 mg 150 mL/hr over 60 Minutes Intravenous Every 12 hours 05/12/16 2208 05/15/16 0818    05/13/16 0600  ceFEPIme (MAXIPIME) 1 g in dextrose 5 % 50 mL IVPB     1 g 100 mL/hr over 30 Minutes Intravenous Every 8 hours 05/12/16 2134 05/19/16 2202   05/12/16 2300  vancomycin (VANCOCIN) 2,000 mg in sodium chloride 0.9 % 500 mL IVPB     2,000 mg 250 mL/hr over 120 Minutes Intravenous  Once 05/12/16 2208 05/13/16 0230   05/12/16 2230  ceFEPIme (MAXIPIME) 2 g in dextrose 5 % 50 mL IVPB     2 g 100 mL/hr over 30 Minutes Intravenous  Once 05/12/16 2208 05/13/16 0003   05/12/16 2200  amoxicillin-clavulanate (AUGMENTIN) 250-125 MG per tablet 1 tablet  Status:  Discontinued     1 tablet Oral 3 times daily 05/12/16 2134 05/12/16 2206        Objective:   Vitals:   05/21/16 2110 05/22/16 0135 05/22/16 1038 05/22/16 1700  BP: (!) 112/50 (!) 134/51 (!) 126/51 (!) 131/58  Pulse: 70 81 88 76  Resp: 18 18 (!) 21 20  Temp: 97.7 F (36.5 C) 98 F (36.7 C) 98.9 F (37.2 C) 98.4 F (36.9 C)  TempSrc: Oral Oral Oral Oral  SpO2: 95% 94% 97% 94%  Weight:      Height:        Wt Readings from Last 3 Encounters:  05/17/16 109.5 kg (241 lb 8 oz)  05/12/16 106.2 kg (234 lb 3.2 oz)  05/06/16 106.2 kg (234 lb 3.2 oz)     Intake/Output Summary (Last 24 hours) at 05/22/16 1914 Last data filed at 05/22/16 0600  Gross per 24 hour  Intake              965 ml  Output              250 ml  Net              715 ml     Physical Exam  Gen:- Awake , Sleepy at times HEENT:- [place].AT, No sclera icterus Neck-Supple Neck,No JVD,.  Lungs-  CTAB  CV- S1, S2 normal Abd-  +ve B.Sounds, Abd Soft, No tenderness,    Extremity/Skin:- Lower extremity edema and tenderness has improved  Data Review:   Micro Results Recent Results (from the past 240 hour(s))  Culture, blood (Routine X 2) w Reflex to ID Panel     Status: None (Preliminary result)   Collection Time: 05/18/16 12:30 PM  Result Value Ref Range Status   Specimen Description BLOOD LEFT ARM  Final   Special Requests IN PEDIATRIC BOTTLE 2 CC   Final   Culture NO GROWTH 4 DAYS  Final   Report Status PENDING  Incomplete  Culture, blood (Routine X 2) w Reflex to ID Panel     Status: None (Preliminary result)   Collection Time: 05/18/16  3:26 PM  Result Value Ref Range Status   Specimen Description BLOOD LEFT ANTECUBITAL  Final   Special Requests BOTTLES DRAWN AEROBIC AND ANAEROBIC 5CC EACH  Final   Culture NO GROWTH 4 DAYS  Final   Report Status PENDING  Incomplete    Radiology Reports Ct Abdomen Pelvis Wo Contrast  Result Date: 05/04/2016 CLINICAL DATA:  Constipation for 1 day, difficulty urinating for 1 day, abdominal pain, history GERD, BPH, GERD, small-bowel obstruction, hypertension, pulmonary embolism, myasthenia gravis, renal cell carcinoma, coronary artery disease, former smoker EXAM: CT ABDOMEN AND PELVIS WITHOUT CONTRAST TECHNIQUE: Multidetector CT imaging of the abdomen and pelvis was performed following the standard protocol without IV contrast. Sagittal and coronal MPR images reconstructed from axial data set. Neither oral nor intravenous contrast were administered for this study. COMPARISON:  CT abdomen 03/03/2016 FINDINGS: Lower chest: Bibasilar atelectasis, increased. Peribronchial thickening particularly LEFT lower lobe. Mild interstitial prominence at lung bases little changed. Hepatobiliary: Minimal gallbladder wall calcification. Gallstone seen previously not definitely visualized on current study. No definite focal hepatic lesions. Pancreas: Slight enlargement of the pancreatic tail with ductal dilatation seen on previous exam less well visualized due the lack of IV contrast. Pancreatic appearance little changed. Spleen: No definite intra splenic abnormalities Adrenals/Urinary Tract: Normal-appearing RIGHT adrenal gland. LEFT adrenal gland not visualized. Normal RIGHT kidney and RIGHT ureter. Foley catheter within decompressed urinary bladder. Bladder wall appears thickened though this may be an artifact from collapsed  state. Minimal prostatic enlargement. Post LEFT nephrectomy. Large soft tissue mass LEFT upper quadrant 17.6 x 10.7 x 8.6 cm in size, mixed attenuation with areas of low-attenuation centrally and irregular soft tissue attenuation peripherally. Mass abuts anterior margin of spleen, lateral margin of stomach, and pancreatic tail but epicenter of mass appears external to these structures. Surrounding irregular infiltrative changes of  LEFT upper quadrant fat with increased number of vessels. Overlying hernia of soft tissue and fat screw surgical defect in the LEFT flank abdominal wall without bowel herniation. This could represent lymphoma or potentially recurrent renal carcinoma. Stomach/Bowel: Diverticulosis of sigmoid and descending colon without definite evidence of diverticulitis. Stomach decompressed limiting assessment of wall thickness. Bowel loops otherwise unremarkable. Appendix not definitely visualized. Vascular/Lymphatic: Mesenteric collaterals/varices particularly RIGHT of midline. Atherosclerotic calcifications aorta, iliac arteries, femoral arteries, coronary arteries, celiac artery, and splenic artery. Aorta normal caliber. No adenopathy. Reproductive: N/A Other: No free air or free fluid. Musculoskeletal: Diffuse osseous demineralization. Multiple thoracolumbar compression fractures with prior spinal augmentation procedures at 3 levels. IMPRESSION: Post LEFT nephrectomy. Marked interval increase in size of a soft tissue mass in the LEFT upper quadrant with question central necrosis, now measuring 17.6 x 10.7 x 8.6 cm, favor lymphoma or recurrent renal cell carcinoma ; while this mass abuts the pancreatic tail, lateral wall of stomach, and anterior margin of spleen, the lesion appears to have an epicenter external to these organs. LEFT flank hernia through nephrectomy scar. Chronic enlargement of pancreatic tail, by prior CT consisting of ductal dilatation and mild parenchymal prominence, less well  visualized on current noncontrast technique exam. Distal colonic diverticulosis. Aortic atherosclerosis with additional atherosclerotic changes as above. Mesenteric collaterals/varices. Electronically Signed   By: Lavonia Dana M.D.   On: 05/04/2016 17:46   Dg Chest 2 View  Result Date: 05/12/2016 CLINICAL DATA:  82 year old male with weakness and tachycardia. EXAM: CHEST  2 VIEW COMPARISON:  05/04/2016 and prior radiographs FINDINGS: This is a low volume film. Increased left lower lung opacity may represent atelectasis or airspace disease/ pneumonia. Mild pulmonary vascular congestion is noted. Upper limits normal heart size noted. No pneumothorax or definite pleural effusion noted. No acute bony abnormalities are identified. IMPRESSION: Low volume film with increased left lower lung opacity-question atelectasis versus airspace disease. Mild pulmonary vascular congestion. Electronically Signed   By: Margarette Canada M.D.   On: 05/12/2016 18:05   Dg Chest 2 View  Result Date: 05/04/2016 CLINICAL DATA:  82 year old male with fever. EXAM: CHEST  2 VIEW COMPARISON:  11/22/2015 FINDINGS: Lower lung volumes leading to bronchovascular crowding and accentuation of the heart size. There is tortuosity and atherosclerosis of the thoracic aorta, unchanged from prior allowing for differences in technique. Bibasilar atelectasis, no confluent airspace disease allowing for low lung volumes. No pleural effusion or pneumothorax. Multiple vertebral compression deformities with kyphoplasty. IMPRESSION: Low lung volumes with bronchovascular crowding and bibasilar atelectasis. No evidence of focal pneumonia on low volume chest. Electronically Signed   By: Jeb Levering M.D.   On: 05/04/2016 19:54   Ct Head Wo Contrast  Result Date: 05/12/2016 CLINICAL DATA:  Weakness. EXAM: CT HEAD WITHOUT CONTRAST TECHNIQUE: Contiguous axial images were obtained from the base of the skull through the vertex without intravenous contrast.  COMPARISON:  11/22/2013 FINDINGS: Brain: No evidence of acute infarction, hemorrhage, hydrocephalus, or mass lesion/mass effect. Remote small bilateral cerebellar infarcts which appears stable from previous. Remote right posterior temporal and parietal cortically based infarct. Small remote bilateral occipital and right posterior frontal cortical infarcts. Small-vessel ischemic change in the cerebral white matter. Vascular: Atherosclerotic calcification. Opacified vessels from recent enhanced abdominal CT. Skull: Negative for fracture or focal lesion. Sinuses/Orbits: Left sphenoid sinusitis is chronic based on prior, with fluid level currently. Bilateral cataract resection Other: None. IMPRESSION: 1. No acute intracranial finding or change compared to 2015. 2. Numerous remote infarcts as described above.  3. Chronic left sphenoid sinusitis.  Fluid level is present today. Electronically Signed   By: Monte Fantasia M.D.   On: 05/12/2016 18:18   Ct Chest W Contrast  Result Date: 05/05/2016 CLINICAL DATA:  Renal cancer.  Evaluate for metastatic disease. EXAM: CT CHEST WITH CONTRAST TECHNIQUE: Multidetector CT imaging of the chest was performed during intravenous contrast administration. CONTRAST:  71mL ISOVUE-300 IOPAMIDOL (ISOVUE-300) INJECTION 61% COMPARISON:  11/09/2006 FINDINGS: Cardiovascular: Heart is enlarged. No pericardial effusion. Coronary artery calcification is noted. Mediastinum/Nodes: Scattered upper normal size mediastinal lymph nodes are not substantially changed in the interval. One of the larger lymph nodes is a 10 mm short axis subcarinal node that is unchanged in the interval. There is no hilar lymphadenopathy. The esophagus has normal imaging features. There is no axillary lymphadenopathy. Lungs/Pleura: There is chronic scarring in the left lung base with volume loss and bronchiectasis, progressed in the interval. Superimposed atelectasis or progressive scar noted on today's exam. Superimposed  airspace infection cannot be excluded. Volume loss with bronchiectasis noted posterior right lower lobe and in the lingula. No discrete pulmonary nodules to raise significant concern for pulmonary metastatic disease in this patient with a history of renal cell carcinoma. No substantial pleural effusion. Upper Abdomen: Large left upper quadrant heterogeneous soft tissue mass better seen on recent CT scan of the abdomen and pelvis. Musculoskeletal: Patient is status post multilevel thoracolumbar vertebral augmentation with un treated compression deformity seen at the T11 vertebral body. IMPRESSION: 1. No definite CT findings to suggest metastatic disease to the chest. 2. Bilateral lower lobe, left greater than right, collapse/consolidation with scarring. 3. Coronary artery atherosclerosis. Electronically Signed   By: Misty Stanley M.D.   On: 05/05/2016 10:31   Mr Jeri Cos [id number] Contrast  Result Date: 05/17/2016 CLINICAL DATA:  Slurred speech. EXAM: MRI HEAD WITHOUT AND WITH CONTRAST TECHNIQUE: Multiplanar, multiecho pulse sequences of the brain and surrounding structures were obtained without and with intravenous contrast. CONTRAST:  72mL MULTIHANCE GADOBENATE DIMEGLUMINE 529 MG/ML IV SOLN COMPARISON:  Head CT 05/12/2016 and MRI 02/04/2004 FINDINGS: The study is moderately motion degraded despite repeated imaging and utilizing faster, more motion resistant protocols. There is a large acute left PCA infarct involving the temporal and occipital lobes. A small amount of confluent petechial hemorrhage is present in the anterior left occipital lobe. Small acute infarcts are present in both cerebral hemispheres, right caudate nucleus, and right frontal, right occipital, and right parietal lobe cortex. There are chronic cortical infarcts involving the right parietal greater than right frontal lobes. Small volume chronic blood products are noted in the right parietal lobe. There are chronic bilateral cerebellar infarcts. T2  hyperintensities elsewhere in the deep cerebral white matter bilaterally are nonspecific but compatible with mild-to-moderate chronic small vessel ischemic disease. There is moderate cerebral atrophy. No mass, midline shift, or extra-axial fluid collection is seen. No abnormal enhancement is identified within limitations of motion. Prior bilateral cataract extraction is noted. Circumferential mucosal thickening and fluid are present in the left sphenoid sinus. The mastoid air cells are clear. Major intracranial vascular flow voids are grossly preserved with vertebrobasilar and ICA dolichoectasia. IMPRESSION: 1. Large acute left PCA infarct with petechial hemorrhage. 2. Small acute embolic infarcts in the cerebellum and right cerebral hemisphere. 3. Chronic ischemic changes as above. Electronically Signed   By: Logan Bores M.D.   On: 05/17/2016 14:16   Ct Abdomen Pelvis W Contrast  Result Date: 05/12/2016 CLINICAL DATA:  had a biopsy of a left upper  quadrant mass today. tachycardia (he was in the 140-150 range after procedure); he arrives here drowsy and in no distress with a heart rate of ~125 and is a regular supraventricular tach. Family are with him also. He is drowsy and is slow to arouse and answers all questions regarding his situation lucidly. He has an indwelling foley cath. In place and a sm. Clean dressing at left lower rib area EXAM: CT ABDOMEN AND PELVIS WITH CONTRAST TECHNIQUE: Multidetector CT imaging of the abdomen and pelvis was performed using the standard protocol following bolus administration of intravenous contrast. CONTRAST:  196mL ISOVUE-300 IOPAMIDOL (ISOVUE-300) INJECTION 61% COMPARISON:  Biopsy images from earlier the same day, and prior studies FINDINGS: Lower chest: Trace bilateral pleural effusions. Consolidation/ atelectasis posteriorly in the visualized lower lobes. Coronary calcifications. Hepatobiliary: Fatty liver without focal lesion. Partially calcified stones in the  nondilated gallbladder. Pancreas: Mild atrophy in the mid body and head. There is masslike enlargement of pancreatic tail and a contiguous complex 17.8 cm mass with peripheral enhancement, central low-attenuation, stable compared to scans dating back to 05/04/2016. No evidence of hematoma or active extravasation. Spleen: The left upper quadrant mass abuts the splenic hilum. There are small wedge-shaped areas of decreased enhancement in the spleen suggesting direct involvement by the mass. Splenic vein thrombosis. Adrenals/Urinary Tract: Previous left nephrectomy. Right adrenal gland and kidney unremarkable. Foley catheter decompresses urinary bladder. Stomach/Bowel: Stomach is decompressed, displaced medially by at the left upper quadrant mass without definite invasion. Small bowel and colon are nondilated. Innumerable sigmoid diverticula without adjacent inflammatory/edematous change. Vascular/Lymphatic: Moderate aortoiliac arterial calcifications without aneurysm or stenosis. Cavernous transformation of the portal vein. Apparent thrombosis of the splenic vein. SMV remains patent. Reproductive: No mass or other significant abnormality. Other: Scattered low-attenuation ascites in the pelvis and pericolic gutters left greater than right. No suggestion of hemoperitoneum. Musculoskeletal: Left lateral body wall hernia containing only fat. Multiple stable lower thoracic and lumbar compression deformities, post vertebral augmentation at T10 and L1. IMPRESSION: 1. Negative for hemorrhage or other acute complication of left upper quadrant mass biopsy. Critical Value/emergent results were called by telephone at the time of interpretation on 05/12/2016 at 5:03 pm to Dr. Laurence Ferrari, who verbally acknowledged these results. 2. Stable left upper quadrant mass abutting the pancreas, probably invading the splenic hilum, with splenic and portal vein thrombosis. 3. Additional ancillary findings as above, stable since previous exam.  Electronically Signed   By: Lucrezia Europe M.D.   On: 05/12/2016 17:04   Ct Biopsy  Result Date: 05/12/2016 INDICATION: 82 year old male with a prior history of renal cell carcinoma and a large left upper quadrant mass concerning for recurrent disease. He presents for biopsy. EXAM: CT BIOPSY MEDICATIONS: None. ANESTHESIA/SEDATION: Moderate (conscious) sedation was employed during this procedure. A total of Versed 1 mg and Fentanyl 50 mcg was administered intravenously. Moderate Sedation Time: 10 minutes. The patient's level of consciousness and vital signs were monitored continuously by radiology nursing throughout the procedure under my direct supervision. FLUOROSCOPY TIME:  None COMPLICATIONS: None immediate. PROCEDURE: Informed written consent was obtained from the patient after a thorough discussion of the procedural risks, benefits and alternatives. All questions were addressed. A timeout was performed prior to the initiation of the procedure. A planning axial CT scan was performed. The large left upper quadrant abdominal mass was identified. A suitable skin entry site was selected and marked. The region was sterilely prepped and draped in standard fashion using chlorhexidine skin prep. Local anesthesia was attained by infiltration  with 1% lidocaine. A small dermatotomy was made. Using intermittent CT guidance, a 19 gauge introducer needle was advanced into the margin of the mass. The introducer needle was positioned to biopsy needle rind of the lesion has centrally it is low attenuation and likely necrotic. Multiple 18 gauge core biopsies were then coaxially obtained using the bio Pince automated biopsy device. Biopsy specimens were placed in saline and delivered to pathology for further analysis. Following removal of the biopsy device and introducer needle, and axial CT scan was performed which demonstrates no hematoma or other complicating feature. The patient tolerated the procedure well. IMPRESSION:  Technically successful CT-guided biopsy of left upper quadrant abdominal mass. Of note, the mass is centrally necrotic. Efforts were made to biopsy the peripheral rim of the lesion in hopes of obtaining viable tissue for diagnosis. Electronically Signed   By: Jacqulynn Cadet M.D.   On: 05/12/2016 13:03   Dg Swallowing Func-speech Pathology  Result Date: 05/20/2016 Objective Swallowing Evaluation: Type of Study: MBS-Modified Barium Swallow Study Patient Details Name: POLK PHONG MRN: [id number] Date of Birth: [birth year] Today's Date: 05/20/2016 Time: SLP Start Time (ACUTE ONLY): 1328-SLP Stop Time (ACUTE ONLY): 1345 SLP Time Calculation (min) (ACUTE ONLY): 17 min Past Medical History: Past Medical History: Diagnosis Date . Arthritis   back, hips, knees  . Benign prostatic hypertrophy  . Cancer [facility])   h/o renal carcinoma  . Chronic kidney disease   h/o UTI-2012 . Coronary artery disease   sees Dr. Shelva Majestic  . Diverticulitis  . GERD (gastroesophageal reflux disease)  . H/O epistaxis  . Heart disease  . Hypertension  . Myasthenia gravis [facility])   sees Dr. Jannifer Franklin . Obesity   exogenous . Osteoporosis  . Partial small bowel obstruction ([place]) 04/16/10  resolved with bowel rest . Pulmonary embolus ([facility])   2000 . Shortness of breath  . Urinary incontinence  . UTI (urinary tract infection)  Past Surgical History: Past Surgical History: Procedure Laterality Date . ANGIOPLASTY    had 2 stent replacements . CARDIAC CATHETERIZATION    2008, stents  . CARDIAC CATHETERIZATION  2008 . COLONOSCOPY  05/28/10  per Dr. Laurence Spates, diverticulosis, no repeats planned  . DOPPLER ECHOCARDIOGRAPHY   . EYE SURGERY    cataracts(bilateral)  removed, ?iol . JOINT REPLACEMENT    2005-R, L knee replacement- 2000 . NEPHRECTOMY    left . NM MYOVIEW LTD  02/2011  moderate inferior scar no ischemia . SP KYPHOPLASTY    and vertebroplasty . stented placed  1/08 . stress dipyridamole myocardial perfusion   . TONSILLECTOMY   . TOTAL KNEE  ARTHROPLASTY    both Sees Dr. Novella Olive . TOTAL KNEE REVISION  03/28/2012  Procedure: TOTAL KNEE REVISION;  Surgeon: Hessie Dibble, MD;  Location: [place];  Service: Orthopedics;  Laterality: Right; HPI: 82 yo male adm to [facility] with weakness - recent biopsy for LUQ mass - pt was diagnosed with HCAP.  PMH + for GERD, renal cancer, Myasthenia Gravis on Mestinon.  CT head negative.  CXR left lower lobe opacity.  Swallow evaluation ordered.    Subjective: pt awake in bed Assessment / Plan / Recommendation CHL IP CLINICAL IMPRESSIONS 05/20/2016 Therapy Diagnosis Mild oral phase dysphagia;Mild pharyngeal phase dysphagia;Moderate pharyngeal phase dysphagia Clinical Impression Mild oral and mile-moderate pharyngeal dysphagia marked by lingual residue post swallow, decreased laryngeal elevation and closure resulting in silent aspiration during the swallow with thin. Mild vallecular and pyriform sinus residue from base of tongue weakness and  decreased laryngeal elevation. Pt exhiibted decreased endurance and fatigue throughout assessment. Recommended pt continue with nectar and Dys 3 (due to oral mastication and transit delay), no straws, swallow twice intermittently, full supervision and meds whole in applesauce. Continue to follow.  Impact on safety and function Moderate aspiration risk   CHL IP TREATMENT RECOMMENDATION 05/20/2016 Treatment Recommendations Therapy as outlined in treatment plan below   Prognosis 05/20/2016 Prognosis for Safe Diet Advancement Fair Barriers to Reach Goals -- Barriers/Prognosis Comment -- CHL IP DIET RECOMMENDATION 05/20/2016 SLP Diet Recommendations Dysphagia 3 (Mech soft) solids;Nectar thick liquid Liquid Administration via Cup;No straw Medication Administration Whole meds with puree Compensations Slow rate;Small sips/bites;Multiple dry swallows after each bite/sip Postural Changes Seated upright at 90 degrees   CHL IP OTHER RECOMMENDATIONS 05/20/2016 Recommended Consults -- Oral Care Recommendations  Oral care BID Other Recommendations --   CHL IP FOLLOW UP RECOMMENDATIONS 05/20/2016 Follow up Recommendations (No Data)   CHL IP FREQUENCY AND DURATION 05/20/2016 Speech Therapy Frequency (ACUTE ONLY) min 2x/week Treatment Duration 2 weeks      CHL IP ORAL PHASE 05/20/2016 Oral Phase Impaired Oral - Pudding Teaspoon -- Oral - Pudding Cup -- Oral - Honey Teaspoon -- Oral - Honey Cup -- Oral - Nectar Teaspoon -- Oral - Nectar Cup Lingual/palatal residue Oral - Nectar Straw -- Oral - Thin Teaspoon -- Oral - Thin Cup Lingual/palatal residue Oral - Thin Straw -- Oral - Puree -- Oral - Mech Soft -- Oral - Regular Lingual/palatal residue Oral - Multi-Consistency -- Oral - Pill -- Oral Phase - Comment --  CHL IP PHARYNGEAL PHASE 05/20/2016 Pharyngeal Phase Impaired Pharyngeal- Pudding Teaspoon -- Pharyngeal -- Pharyngeal- Pudding Cup -- Pharyngeal -- Pharyngeal- Honey Teaspoon -- Pharyngeal -- Pharyngeal- Honey Cup -- Pharyngeal -- Pharyngeal- Nectar Teaspoon -- Pharyngeal -- Pharyngeal- Nectar Cup Pharyngeal residue - valleculae;Pharyngeal residue - pyriform;Reduced tongue base retraction;Reduced laryngeal elevation Pharyngeal -- Pharyngeal- Nectar Straw -- Pharyngeal -- Pharyngeal- Thin Teaspoon -- Pharyngeal -- Pharyngeal- Thin Cup Penetration/Aspiration during swallow;Reduced laryngeal elevation;Reduced airway/laryngeal closure Pharyngeal Material enters airway, passes BELOW cords without attempt by patient to eject out (silent aspiration);Material enters airway, remains ABOVE vocal cords then ejected out Pharyngeal- Thin Straw -- Pharyngeal -- Pharyngeal- Puree -- Pharyngeal -- Pharyngeal- Mechanical Soft -- Pharyngeal -- Pharyngeal- Regular Pharyngeal residue - valleculae;Reduced tongue base retraction Pharyngeal -- Pharyngeal- Multi-consistency -- Pharyngeal -- Pharyngeal- Pill -- Pharyngeal -- Pharyngeal Comment --  CHL IP CERVICAL ESOPHAGEAL PHASE 05/20/2016 Cervical Esophageal Phase WFL Pudding Teaspoon -- Pudding Cup  -- Honey Teaspoon -- Honey Cup -- Nectar Teaspoon -- Nectar Cup -- Nectar Straw -- Thin Teaspoon -- Thin Cup -- Thin Straw -- Puree -- Mechanical Soft -- Regular -- Multi-consistency -- Pill -- Cervical Esophageal Comment -- CHL IP GO 05/14/2016 Functional Assessment Tool Used (None) Functional Limitations Swallowing Swallow Current Status [id number]) CL Swallow Goal Status [id number]) CK Swallow Discharge Status [id number]) (None) Motor Speech Current Status [id number]) (None) Motor Speech Goal Status UK:060616) (None) Motor Speech Goal Status SA:931536) (None) Spoken Language Comprehension Current Status [id number]) (None) Spoken Language Comprehension Goal Status [id number]) (None) Spoken Language Comprehension Discharge Status [id number]) (None) Spoken Language Expression Current Status FP:[id number]) (None) Spoken Language Expression Goal Status [id number]) (None) Spoken Language Expression Discharge Status NF:1565649) (None) Attention Current Status [id number]) (None) Attention Goal Status EY:7266000) (None) Attention Discharge Status [id number]) (None) Memory Current Status YL:3545582) (None) Memory Goal Status CF:[id number]) (None) Memory Discharge Status QC:115444) (None) Voice Current Status [id number]) (None) Voice Goal Status [id number]) (None) Voice Discharge  Status [phone]) (None) Other Speech-Language Pathology Functional Limitation [phone]) (None) Other Speech-Language Pathology Functional Limitation Goal Status [id number]) (None) Other Speech-Language Pathology Functional Limitation Discharge Status [phone]) (None) [place] Siren 05/20/2016, 2:42 PM Orbie Pyo Colvin Caroli.Ed CCC-SLP Pager [phone]                CBC  Recent Labs Lab 05/18/16 1216 05/19/16 0027 05/20/16 0638 05/21/16 0533 05/22/16 0232  WBC 7.6 7.6 7.9 6.5 7.4  HGB 10.4* 10.5* 10.9* 9.6* 10.5*  HCT 31.9* 31.8* 32.6* 29.1* 31.8*  PLT 285 313 307 297 308  MCV 90.9 90.1 90.6 89.8 89.3  MCH 29.6 29.7 30.3 29.6 29.5  MCHC 32.6 33.0 33.4 33.0 33.0  RDW 15.7* 15.7* 15.8* 15.8* 15.9*    Chemistries    Recent Labs Lab 05/18/16 1216 05/19/16 0027 05/22/16 0232  NA 132* 133* 132*  K 4.2 4.0 4.0  CL 104 103 102  CO2 22 24 23   GLUCOSE 93 105* 94  BUN 14 15 19   CREATININE 0.76 0.83 0.82  CALCIUM 8.1* 8.2* 8.2*  MG 2.0  --   --   AST  --  55*  --   ALT  --  25  --   ALKPHOS  --  175*  --   BILITOT  --  0.8  --    ------------------------------------------------------------------------------------------------------------------ No results for input(s): CHOL, HDL, LDLCALC, TRIG, CHOLHDL, LDLDIRECT in the last 72 hours.  Lab Results  Component Value Date   HGBA1C 5.9 (H) 05/18/2016   ------------------------------------------------------------------------------------------------------------------ No results for input(s): TSH, T4TOTAL, T3FREE, THYROIDAB in the last 72 hours.  Invalid input(s): FREET3 ------------------------------------------------------------------------------------------------------------------ No results for input(s): VITAMINB12, FOLATE, FERRITIN, TIBC, IRON, RETICCTPCT in the last 72 hours.  Coagulation profile No results for input(s): INR, PROTIME in the last 168 hours.  No results for input(s): DDIMER in the last 72 hours.  Cardiac Enzymes No results for input(s): CKMB, TROPONINI, MYOGLOBIN in the last 168 hours.  Invalid input(s): CK ------------------------------------------------------------------------------------------------------------------ No results found for: BNP   Yeshua Stryker M.D on 05/22/2016 at 7:14 PM  Between 7am to 7pm - Pager - [phone]  After 7pm go to [email or web address] - password [email or web address]  [facility] -  Office  [phone]  Dragon dictation system was used to create this note, attempts have been made to correct errors, however presence of uncorrected errors is not a reflection quality of care provided

## 2016-05-22 NOTE — Progress Notes (Signed)
ANTICOAGULATION CONSULT NOTE - Follow Up Consult  Pharmacy Consult for Heparin  Indication: DVT   Allergies  Allergen Reactions  . Dicyclomine Other (See Comments)    REACTION: can worsen myasthenia gravis    Patient Measurements: Height: 5\' 10"  (177.8 cm) Weight: 241 lb 8 oz (109.5 kg) IBW/kg (Calculated) : 73  Heparin Dosing Weight: 97 kg  Vital Signs: Temp: 98 F (36.7 C) (08/19 0135) Temp Source: Oral (08/19 0135) BP: 134/51 (08/19 0135) Pulse Rate: 81 (08/19 0135)  Labs:  Recent Labs  05/20/16 0638 05/21/16 0533 05/22/16 0232  HGB 10.9* 9.6* 10.5*  HCT 32.6* 29.1* 31.8*  PLT 307 297 308  HEPARINUNFRC 0.48 0.35 0.54  CREATININE  --   --  0.82    Estimated Creatinine Clearance: 77.2 mL/min (by C-G formula based on SCr of 0.82 mg/dL).  Assessment: New DVT in setting of acute stroke. This morning's HL has trended up to slightly above goal at 0.54 on heparin 1500 units/hr. No issues with infusion or bleeding noted and H/H remain low but stable, pltc wnl and stable.  Goal of Therapy:  Heparin level 0.3-0.5 units/ml, in setting of acute stroke Monitor platelets by anticoagulation protocol: Yes   Plan:  Decrease heparin gtt slightly to 1400 units/hr to maintain in goal range 6 hr HL  Daily HL Monitor s/sx of bleeding   Seferino Oscar K. Velva Harman, PharmD, BCPS, CPP Clinical Pharmacist Pager: [phone] Phone: [phone] 05/22/2016 8:33 AM

## 2016-05-22 NOTE — Progress Notes (Signed)
ANTICOAGULATION CONSULT NOTE - Follow Up Consult  Pharmacy Consult for Heparin  Indication: DVT   Allergies  Allergen Reactions  . Dicyclomine Other (See Comments)    REACTION: can worsen myasthenia gravis    Patient Measurements: Height: 5\' 10"  (177.8 cm) Weight: 241 lb 8 oz (109.5 kg) IBW/kg (Calculated) : 73  Heparin Dosing Weight: 97 kg  Vital Signs: Temp: 98.9 F (37.2 C) (08/19 1038) Temp Source: Oral (08/19 1038) BP: 126/51 (08/19 1038) Pulse Rate: 88 (08/19 1038)  Labs:  Recent Labs  05/20/16 0638 05/21/16 0533 05/22/16 0232 05/22/16 1442  HGB 10.9* 9.6* 10.5*  --   HCT 32.6* 29.1* 31.8*  --   PLT 307 297 308  --   HEPARINUNFRC 0.48 0.35 0.54 0.47  CREATININE  --   --  0.82  --     Estimated Creatinine Clearance: 77.2 mL/min (by C-G formula based on SCr of 0.82 mg/dL).  Assessment: New DVT in setting of acute stroke. Heparin level is now at goal after slight rate reduction this AM. No bleeding noted.   Goal of Therapy:  Heparin level 0.3-0.5 units/ml, in setting of acute stroke Monitor platelets by anticoagulation protocol: Yes   Plan:  - Continue heparin gtt 1400 units/hr - Daily heparin level and CBC  Salome Arnt, PharmD, BCPS Pager # [phone] 05/22/2016 3:24 PM

## 2016-05-23 DIAGNOSIS — I824Y1 Acute embolism and thrombosis of unspecified deep veins of right proximal lower extremity: Secondary | ICD-10-CM

## 2016-05-23 LAB — CULTURE, BLOOD (ROUTINE X 2)
CULTURE: NO GROWTH
Culture: NO GROWTH

## 2016-05-23 LAB — CBC
HEMATOCRIT: 31.1 % — AB (ref 39.0–52.0)
HEMOGLOBIN: 10.3 g/dL — AB (ref 13.0–17.0)
MCH: 30.2 pg (ref 26.0–34.0)
MCHC: 33.1 g/dL (ref 30.0–36.0)
MCV: 91.2 fL (ref 78.0–100.0)
Platelets: 295 10*3/uL (ref 150–400)
RBC: 3.41 MIL/uL — ABNORMAL LOW (ref 4.22–5.81)
RDW: 16.4 % — AB (ref 11.5–15.5)
WBC: 7.5 10*3/uL (ref 4.0–10.5)

## 2016-05-23 NOTE — Progress Notes (Signed)
CM received call from secretary of unit as daughter of pt wished to speak with me.  CM explained I would not be able to discuss pt with her and she states she just needs to see who she talks to about her father not having a will.  At this point of the conversation, this CM told daughter her concerns sounded like internal family issues she needed to address with pt and pt's wife.  NO other CM needs were communicated.

## 2016-05-23 NOTE — Progress Notes (Signed)
Patient ID: [name], male   DOB: 1925-03-31, 82 y.o.   MRN: [id number]                                                                PROGRESS NOTE                                                                                                                                                                                                             Patient Demographics:    [name], is a 82 y.o. male, DOB - November 21, 1924, [id number]  Admit date - 05/12/2016   Admitting Physician Reubin Milan, MD  Outpatient Primary MD for the patient is [name], MD  LOS - 9  Outpatient Specialists:  Chief Complaint  Patient presents with  . Tachycardia       Brief Narrative     Subjective:    [name] today has, poor appeitite,  generalized weakness.  No headache, No chest pain, No abdominal pain - No Nausea, No new weakness tingling or numbness, No Cough - SOB.   Assessment  & Plan :    Principal Problem:   HCAP (healthcare-associated pneumonia) Active Problems:   MYASTHENIA GRAVIS W/O (ACUTE) EXACERBATION   Essential hypertension   GERD   BPH (benign prostatic hyperplasia)   Abdominal pain   Abdominal mass, LUQ (left upper quadrant)   Normocytic anemia   CAD (coronary artery disease)   Sinus tachycardia (HCC)   Splenic vein thrombosis   Portal vein thrombosis   Demand ischemia (HCC)   Acute thromboembolic cerebrovascular accident (CVA) ([place])   Metastatic cancer ([place])   Palliative care encounter   DNR (do not resuscitate) discussion   Goals of care, counseling/discussion   Cerebrovascular accident (CVA) due to embolism of cerebral artery ([place])   Acute deep vein thrombosis (DVT) of right lower extremity ([place])   DNR (do not resuscitate)    1)Acute strokes in both cerebral hemispheres and right cerebellar hemisphere- suspectHypercoagulable state secondary to underlying malignancy with possible intracardiac source, neurology service has signed off.  Continue IV heparin for DVT for until 05/22/16   Lovenox    2)Rt LE DVT- continue IV heparin,  See # 1 above  3)intra-abdominal malignancy-axis post previous left nephrectomy for malignancy, ??? If recurrence, final pathology reports =>  poorly differentiated carcinoma, papillary renal cell carcinoma favored  4)Other- palliative care consult appreciated to help determining goals of care and CODE STATUS  5)Myasthenia Gravis- [place] has been increased due to generalized weakness, Fatigue and malaise persist  6)Dispo-significant weakness and debility, patient will most likely need skilled nursing facility for subacute rehabilitation, social work on board  7)FEN-poor oral intake, swallow eval appreciated, mechanically soft diet with [place] thickened liquids advised, Ns at 50 ml/hr until oral intake improves to avoid dehydration and AKI  Consults  :  Palliative care and urology   DVT Prophylaxis  :  iv Heparin      Code Status :  DNR  Family Communication  :   Disposition Plan  : SNF  Barriers For Discharge :   Consults  :    Procedures  :  Biopsy 05/12/2016  DVT Prophylaxis  :  Lovenox, SCD  Lab Results  Component Value Date   PLT 295 05/23/2016    Antibiotics  :    Anti-infectives    Start     Dose/Rate Route Frequency Ordered Stop   05/15/16 0900  vancomycin (VANCOCIN) IVPB 1000 mg/200 mL premix     1,000 mg 200 mL/hr over 60 Minutes Intravenous Every 12 hours 05/15/16 0818 05/19/16 1224   05/13/16 0800  vancomycin (VANCOCIN) IVPB 750 mg/150 ml premix  Status:  Discontinued     750 mg 150 mL/hr over 60 Minutes Intravenous Every 12 hours 05/12/16 2208 05/15/16 0818   05/13/16 0600  ceFEPIme (MAXIPIME) 1 g in dextrose 5 % 50 mL IVPB     1 g 100 mL/hr over 30 Minutes Intravenous Every 8 hours 05/12/16 2134 05/19/16 2202   05/12/16 2300  vancomycin (VANCOCIN) 2,000 mg in sodium chloride 0.9 % 500 mL IVPB     2,000 mg 250 mL/hr over 120 Minutes Intravenous   Once 05/12/16 2208 05/13/16 0230   05/12/16 2230  ceFEPIme (MAXIPIME) 2 g in dextrose 5 % 50 mL IVPB     2 g 100 mL/hr over 30 Minutes Intravenous  Once 05/12/16 2208 05/13/16 0003   05/12/16 2200  amoxicillin-clavulanate (AUGMENTIN) 250-125 MG per tablet 1 tablet  Status:  Discontinued     1 tablet Oral 3 times daily 05/12/16 2134 05/12/16 2206        Objective:   Vitals:   05/22/16 2100 05/23/16 0116 05/23/16 0546 05/23/16 1045  BP: 135/62 (!) 135/53 (!) 135/56 126/62  Pulse: 89 (!) 104 (!) 104 (!) 103  Resp: 20 20 20 20   Temp: 98.4 F (36.9 C) 98.2 F (36.8 C) 97.8 F (36.6 C) 98.3 F (36.8 C)  TempSrc: Oral Oral Oral Oral  SpO2: 94% 95% 93% 95%  Weight:      Height:        Wt Readings from Last 3 Encounters:  05/17/16 109.5 kg (241 lb 8 oz)  05/12/16 106.2 kg (234 lb 3.2 oz)  05/06/16 106.2 kg (234 lb 3.2 oz)     Intake/Output Summary (Last 24 hours) at 05/23/16 1630 Last data filed at 05/23/16 1232  Gross per 24 hour  Intake             1620 ml  Output                0 ml  Net             1620 ml     Physical Exam  Awake Alert, Oriented X 3, No new F.N deficits, Normal  affect [place].AT,PERRAL Supple Neck,No JVD, No cervical lymphadenopathy appriciated.  Symmetrical Chest wall movement, Good air movement bilaterally, CTAB RRR,No Gallops,Rubs or new Murmurs, No Parasternal Heave +ve B.Sounds, Abd Soft, No tenderness, No organomegaly appriciated, No rebound - guarding or rigidity. No Cyanosis, Clubbing or edema, No new Rash or bruise      Data Review:    CBC  Recent Labs Lab 05/19/16 0027 05/20/16 [id number] 05/21/16 0533 05/22/16 0232 05/23/16 0215  WBC 7.6 7.9 6.5 7.4 7.5  HGB 10.5* 10.9* 9.6* 10.5* 10.3*  HCT 31.8* 32.6* 29.1* 31.8* 31.1*  PLT 313 307 297 308 295  MCV 90.1 90.6 89.8 89.3 91.2  MCH 29.7 30.3 29.6 29.5 30.2  MCHC 33.0 33.4 33.0 33.0 33.1  RDW 15.7* 15.8* 15.8* 15.9* 16.4*    Chemistries   Recent Labs Lab 05/18/16 1216  05/19/16 0027 05/22/16 0232  NA 132* 133* 132*  K 4.2 4.0 4.0  CL 104 103 102  CO2 22 24 23   GLUCOSE 93 105* 94  BUN 14 15 19   CREATININE 0.76 0.83 0.82  CALCIUM 8.1* 8.2* 8.2*  MG 2.0  --   --   AST  --  55*  --   ALT  --  25  --   ALKPHOS  --  175*  --   BILITOT  --  0.8  --    ------------------------------------------------------------------------------------------------------------------ No results for input(s): CHOL, HDL, LDLCALC, TRIG, CHOLHDL, LDLDIRECT in the last 72 hours.  Lab Results  Component Value Date   HGBA1C 5.9 (H) 05/18/2016   ------------------------------------------------------------------------------------------------------------------ No results for input(s): TSH, T4TOTAL, T3FREE, THYROIDAB in the last 72 hours.  Invalid input(s): FREET3 ------------------------------------------------------------------------------------------------------------------ No results for input(s): VITAMINB12, FOLATE, FERRITIN, TIBC, IRON, RETICCTPCT in the last 72 hours.  Coagulation profile No results for input(s): INR, PROTIME in the last 168 hours.  No results for input(s): DDIMER in the last 72 hours.  Cardiac Enzymes No results for input(s): CKMB, TROPONINI, MYOGLOBIN in the last 168 hours.  Invalid input(s): CK ------------------------------------------------------------------------------------------------------------------ No results found for: BNP  Inpatient Medications  Scheduled Meds: . antiseptic oral rinse  7 mL Mouth Rinse BID  . enoxaparin (LOVENOX) injection  1 mg/kg Subcutaneous Q12H  . famotidine  20 mg Oral Daily  . guaiFENesin-dextromethorphan  10 mL Oral TID  . metoprolol succinate  50 mg Oral Daily  . multivitamin with minerals  1 tablet Oral Daily  . mycophenolate  1,000 mg Oral QHS  . mycophenolate  500 mg Oral Daily  . pyridostigmine  60 mg Oral QID  . sodium chloride flush  3 mL Intravenous Q12H  . tamsulosin  0.4 mg Oral Daily    Continuous Infusions: . sodium chloride 50 mL/hr at 05/21/16 2321   PRN Meds:.food thickener, levalbuterol, polyethylene glycol, RESOURCE THICKENUP CLEAR, senna  Micro Results Recent Results (from the past 240 hour(s))  Culture, blood (Routine X 2) w Reflex to ID Panel     Status: None (Preliminary result)   Collection Time: 05/18/16 12:30 PM  Result Value Ref Range Status   Specimen Description BLOOD LEFT ARM  Final   Special Requests IN PEDIATRIC BOTTLE 2 CC  Final   Culture NO GROWTH 4 DAYS  Final   Report Status PENDING  Incomplete  Culture, blood (Routine X 2) w Reflex to ID Panel     Status: None (Preliminary result)   Collection Time: 05/18/16  3:26 PM  Result Value Ref Range Status   Specimen Description BLOOD LEFT ANTECUBITAL  Final  Special Requests BOTTLES DRAWN AEROBIC AND ANAEROBIC 5CC EACH  Final   Culture NO GROWTH 4 DAYS  Final   Report Status PENDING  Incomplete    Radiology Reports Ct Abdomen Pelvis Wo Contrast  Result Date: 05/04/2016 CLINICAL DATA:  Constipation for 1 day, difficulty urinating for 1 day, abdominal pain, history GERD, BPH, GERD, small-bowel obstruction, hypertension, pulmonary embolism, myasthenia gravis, renal cell carcinoma, coronary artery disease, former smoker EXAM: CT ABDOMEN AND PELVIS WITHOUT CONTRAST TECHNIQUE: Multidetector CT imaging of the abdomen and pelvis was performed following the standard protocol without IV contrast. Sagittal and coronal MPR images reconstructed from axial data set. Neither oral nor intravenous contrast were administered for this study. COMPARISON:  CT abdomen 03/03/2016 FINDINGS: Lower chest: Bibasilar atelectasis, increased. Peribronchial thickening particularly LEFT lower lobe. Mild interstitial prominence at lung bases little changed. Hepatobiliary: Minimal gallbladder wall calcification. Gallstone seen previously not definitely visualized on current study. No definite focal hepatic lesions. Pancreas: Slight  enlargement of the pancreatic tail with ductal dilatation seen on previous exam less well visualized due the lack of IV contrast. Pancreatic appearance little changed. Spleen: No definite intra splenic abnormalities Adrenals/Urinary Tract: Normal-appearing RIGHT adrenal gland. LEFT adrenal gland not visualized. Normal RIGHT kidney and RIGHT ureter. Foley catheter within decompressed urinary bladder. Bladder wall appears thickened though this may be an artifact from collapsed state. Minimal prostatic enlargement. Post LEFT nephrectomy. Large soft tissue mass LEFT upper quadrant 17.6 x 10.7 x 8.6 cm in size, mixed attenuation with areas of low-attenuation centrally and irregular soft tissue attenuation peripherally. Mass abuts anterior margin of spleen, lateral margin of stomach, and pancreatic tail but epicenter of mass appears external to these structures. Surrounding irregular infiltrative changes of LEFT upper quadrant fat with increased number of vessels. Overlying hernia of soft tissue and fat screw surgical defect in the LEFT flank abdominal wall without bowel herniation. This could represent lymphoma or potentially recurrent renal carcinoma. Stomach/Bowel: Diverticulosis of sigmoid and descending colon without definite evidence of diverticulitis. Stomach decompressed limiting assessment of wall thickness. Bowel loops otherwise unremarkable. Appendix not definitely visualized. Vascular/Lymphatic: Mesenteric collaterals/varices particularly RIGHT of midline. Atherosclerotic calcifications aorta, iliac arteries, femoral arteries, coronary arteries, celiac artery, and splenic artery. Aorta normal caliber. No adenopathy. Reproductive: N/A Other: No free air or free fluid. Musculoskeletal: Diffuse osseous demineralization. Multiple thoracolumbar compression fractures with prior spinal augmentation procedures at 3 levels. IMPRESSION: Post LEFT nephrectomy. Marked interval increase in size of a soft tissue mass in the  LEFT upper quadrant with question central necrosis, now measuring 17.6 x 10.7 x 8.6 cm, favor lymphoma or recurrent renal cell carcinoma ; while this mass abuts the pancreatic tail, lateral wall of stomach, and anterior margin of spleen, the lesion appears to have an epicenter external to these organs. LEFT flank hernia through nephrectomy scar. Chronic enlargement of pancreatic tail, by prior CT consisting of ductal dilatation and mild parenchymal prominence, less well visualized on current noncontrast technique exam. Distal colonic diverticulosis. Aortic atherosclerosis with additional atherosclerotic changes as above. Mesenteric collaterals/varices. Electronically Signed   By: Lavonia Dana M.D.   On: 05/04/2016 17:46   Dg Chest 2 View  Result Date: 05/12/2016 CLINICAL DATA:  82 year old male with weakness and tachycardia. EXAM: CHEST  2 VIEW COMPARISON:  05/04/2016 and prior radiographs FINDINGS: This is a low volume film. Increased left lower lung opacity may represent atelectasis or airspace disease/ pneumonia. Mild pulmonary vascular congestion is noted. Upper limits normal heart size noted. No pneumothorax or definite pleural effusion  noted. No acute bony abnormalities are identified. IMPRESSION: Low volume film with increased left lower lung opacity-question atelectasis versus airspace disease. Mild pulmonary vascular congestion. Electronically Signed   By: Margarette Canada M.D.   On: 05/12/2016 18:05   Dg Chest 2 View  Result Date: 05/04/2016 CLINICAL DATA:  82 year old male with fever. EXAM: CHEST  2 VIEW COMPARISON:  11/22/2015 FINDINGS: Lower lung volumes leading to bronchovascular crowding and accentuation of the heart size. There is tortuosity and atherosclerosis of the thoracic aorta, unchanged from prior allowing for differences in technique. Bibasilar atelectasis, no confluent airspace disease allowing for low lung volumes. No pleural effusion or pneumothorax. Multiple vertebral compression  deformities with kyphoplasty. IMPRESSION: Low lung volumes with bronchovascular crowding and bibasilar atelectasis. No evidence of focal pneumonia on low volume chest. Electronically Signed   By: Jeb Levering M.D.   On: 05/04/2016 19:54   Ct Head Wo Contrast  Result Date: 05/12/2016 CLINICAL DATA:  Weakness. EXAM: CT HEAD WITHOUT CONTRAST TECHNIQUE: Contiguous axial images were obtained from the base of the skull through the vertex without intravenous contrast. COMPARISON:  11/22/2013 FINDINGS: Brain: No evidence of acute infarction, hemorrhage, hydrocephalus, or mass lesion/mass effect. Remote small bilateral cerebellar infarcts which appears stable from previous. Remote right posterior temporal and parietal cortically based infarct. Small remote bilateral occipital and right posterior frontal cortical infarcts. Small-vessel ischemic change in the cerebral white matter. Vascular: Atherosclerotic calcification. Opacified vessels from recent enhanced abdominal CT. Skull: Negative for fracture or focal lesion. Sinuses/Orbits: Left sphenoid sinusitis is chronic based on prior, with fluid level currently. Bilateral cataract resection Other: None. IMPRESSION: 1. No acute intracranial finding or change compared to 2015. 2. Numerous remote infarcts as described above. 3. Chronic left sphenoid sinusitis.  Fluid level is present today. Electronically Signed   By: Monte Fantasia M.D.   On: 05/12/2016 18:18   Ct Chest W Contrast  Result Date: 05/05/2016 CLINICAL DATA:  Renal cancer.  Evaluate for metastatic disease. EXAM: CT CHEST WITH CONTRAST TECHNIQUE: Multidetector CT imaging of the chest was performed during intravenous contrast administration. CONTRAST:  64mL ISOVUE-300 IOPAMIDOL (ISOVUE-300) INJECTION 61% COMPARISON:  11/09/2006 FINDINGS: Cardiovascular: Heart is enlarged. No pericardial effusion. Coronary artery calcification is noted. Mediastinum/Nodes: Scattered upper normal size mediastinal lymph nodes  are not substantially changed in the interval. One of the larger lymph nodes is a 10 mm short axis subcarinal node that is unchanged in the interval. There is no hilar lymphadenopathy. The esophagus has normal imaging features. There is no axillary lymphadenopathy. Lungs/Pleura: There is chronic scarring in the left lung base with volume loss and bronchiectasis, progressed in the interval. Superimposed atelectasis or progressive scar noted on today's exam. Superimposed airspace infection cannot be excluded. Volume loss with bronchiectasis noted posterior right lower lobe and in the lingula. No discrete pulmonary nodules to raise significant concern for pulmonary metastatic disease in this patient with a history of renal cell carcinoma. No substantial pleural effusion. Upper Abdomen: Large left upper quadrant heterogeneous soft tissue mass better seen on recent CT scan of the abdomen and pelvis. Musculoskeletal: Patient is status post multilevel thoracolumbar vertebral augmentation with un treated compression deformity seen at the T11 vertebral body. IMPRESSION: 1. No definite CT findings to suggest metastatic disease to the chest. 2. Bilateral lower lobe, left greater than right, collapse/consolidation with scarring. 3. Coronary artery atherosclerosis. Electronically Signed   By: Misty Stanley M.D.   On: 05/05/2016 10:31   Mr Jeri Cos [id number] Contrast  Result Date: 05/17/2016  CLINICAL DATA:  Slurred speech. EXAM: MRI HEAD WITHOUT AND WITH CONTRAST TECHNIQUE: Multiplanar, multiecho pulse sequences of the brain and surrounding structures were obtained without and with intravenous contrast. CONTRAST:  40mL MULTIHANCE GADOBENATE DIMEGLUMINE 529 MG/ML IV SOLN COMPARISON:  Head CT 05/12/2016 and MRI 02/04/2004 FINDINGS: The study is moderately motion degraded despite repeated imaging and utilizing faster, more motion resistant protocols. There is a large acute left PCA infarct involving the temporal and occipital lobes. A  small amount of confluent petechial hemorrhage is present in the anterior left occipital lobe. Small acute infarcts are present in both cerebral hemispheres, right caudate nucleus, and right frontal, right occipital, and right parietal lobe cortex. There are chronic cortical infarcts involving the right parietal greater than right frontal lobes. Small volume chronic blood products are noted in the right parietal lobe. There are chronic bilateral cerebellar infarcts. T2 hyperintensities elsewhere in the deep cerebral white matter bilaterally are nonspecific but compatible with mild-to-moderate chronic small vessel ischemic disease. There is moderate cerebral atrophy. No mass, midline shift, or extra-axial fluid collection is seen. No abnormal enhancement is identified within limitations of motion. Prior bilateral cataract extraction is noted. Circumferential mucosal thickening and fluid are present in the left sphenoid sinus. The mastoid air cells are clear. Major intracranial vascular flow voids are grossly preserved with vertebrobasilar and ICA dolichoectasia. IMPRESSION: 1. Large acute left PCA infarct with petechial hemorrhage. 2. Small acute embolic infarcts in the cerebellum and right cerebral hemisphere. 3. Chronic ischemic changes as above. Electronically Signed   By: Logan Bores M.D.   On: 05/17/2016 14:16   Ct Abdomen Pelvis W Contrast  Result Date: 05/12/2016 CLINICAL DATA:  had a biopsy of a left upper quadrant mass today. tachycardia (he was in the 140-150 range after procedure); he arrives here drowsy and in no distress with a heart rate of ~125 and is a regular supraventricular tach. Family are with him also. He is drowsy and is slow to arouse and answers all questions regarding his situation lucidly. He has an indwelling foley cath. In place and a sm. Clean dressing at left lower rib area EXAM: CT ABDOMEN AND PELVIS WITH CONTRAST TECHNIQUE: Multidetector CT imaging of the abdomen and pelvis was  performed using the standard protocol following bolus administration of intravenous contrast. CONTRAST:  143mL ISOVUE-300 IOPAMIDOL (ISOVUE-300) INJECTION 61% COMPARISON:  Biopsy images from earlier the same day, and prior studies FINDINGS: Lower chest: Trace bilateral pleural effusions. Consolidation/ atelectasis posteriorly in the visualized lower lobes. Coronary calcifications. Hepatobiliary: Fatty liver without focal lesion. Partially calcified stones in the nondilated gallbladder. Pancreas: Mild atrophy in the mid body and head. There is masslike enlargement of pancreatic tail and a contiguous complex 17.8 cm mass with peripheral enhancement, central low-attenuation, stable compared to scans dating back to 05/04/2016. No evidence of hematoma or active extravasation. Spleen: The left upper quadrant mass abuts the splenic hilum. There are small wedge-shaped areas of decreased enhancement in the spleen suggesting direct involvement by the mass. Splenic vein thrombosis. Adrenals/Urinary Tract: Previous left nephrectomy. Right adrenal gland and kidney unremarkable. Foley catheter decompresses urinary bladder. Stomach/Bowel: Stomach is decompressed, displaced medially by at the left upper quadrant mass without definite invasion. Small bowel and colon are nondilated. Innumerable sigmoid diverticula without adjacent inflammatory/edematous change. Vascular/Lymphatic: Moderate aortoiliac arterial calcifications without aneurysm or stenosis. Cavernous transformation of the portal vein. Apparent thrombosis of the splenic vein. SMV remains patent. Reproductive: No mass or other significant abnormality. Other: Scattered low-attenuation ascites in the pelvis  and pericolic gutters left greater than right. No suggestion of hemoperitoneum. Musculoskeletal: Left lateral body wall hernia containing only fat. Multiple stable lower thoracic and lumbar compression deformities, post vertebral augmentation at T10 and L1. IMPRESSION: 1.  Negative for hemorrhage or other acute complication of left upper quadrant mass biopsy. Critical Value/emergent results were called by telephone at the time of interpretation on 05/12/2016 at 5:03 pm to Dr. Laurence Ferrari, who verbally acknowledged these results. 2. Stable left upper quadrant mass abutting the pancreas, probably invading the splenic hilum, with splenic and portal vein thrombosis. 3. Additional ancillary findings as above, stable since previous exam. Electronically Signed   By: Lucrezia Europe M.D.   On: 05/12/2016 17:04   Ct Biopsy  Result Date: 05/12/2016 INDICATION: 82 year old male with a prior history of renal cell carcinoma and a large left upper quadrant mass concerning for recurrent disease. He presents for biopsy. EXAM: CT BIOPSY MEDICATIONS: None. ANESTHESIA/SEDATION: Moderate (conscious) sedation was employed during this procedure. A total of Versed 1 mg and Fentanyl 50 mcg was administered intravenously. Moderate Sedation Time: 10 minutes. The patient's level of consciousness and vital signs were monitored continuously by radiology nursing throughout the procedure under my direct supervision. FLUOROSCOPY TIME:  None COMPLICATIONS: None immediate. PROCEDURE: Informed written consent was obtained from the patient after a thorough discussion of the procedural risks, benefits and alternatives. All questions were addressed. A timeout was performed prior to the initiation of the procedure. A planning axial CT scan was performed. The large left upper quadrant abdominal mass was identified. A suitable skin entry site was selected and marked. The region was sterilely prepped and draped in standard fashion using chlorhexidine skin prep. Local anesthesia was attained by infiltration with 1% lidocaine. A small dermatotomy was made. Using intermittent CT guidance, a 19 gauge introducer needle was advanced into the margin of the mass. The introducer needle was positioned to biopsy needle rind of the lesion has  centrally it is low attenuation and likely necrotic. Multiple 18 gauge core biopsies were then coaxially obtained using the bio Pince automated biopsy device. Biopsy specimens were placed in saline and delivered to pathology for further analysis. Following removal of the biopsy device and introducer needle, and axial CT scan was performed which demonstrates no hematoma or other complicating feature. The patient tolerated the procedure well. IMPRESSION: Technically successful CT-guided biopsy of left upper quadrant abdominal mass. Of note, the mass is centrally necrotic. Efforts were made to biopsy the peripheral rim of the lesion in hopes of obtaining viable tissue for diagnosis. Electronically Signed   By: Jacqulynn Cadet M.D.   On: 05/12/2016 13:03   Dg Swallowing Func-speech Pathology  Result Date: 05/20/2016 Objective Swallowing Evaluation: Type of Study: MBS-Modified Barium Swallow Study Patient Details Name: NATHAN PINSON MRN: [id number] Date of Birth: Jul 12, 1925 Today's Date: 05/20/2016 Time: SLP Start Time (ACUTE ONLY): 1328-SLP Stop Time (ACUTE ONLY): 1345 SLP Time Calculation (min) (ACUTE ONLY): 17 min Past Medical History: Past Medical History: Diagnosis Date . Arthritis   back, hips, knees  . Benign prostatic hypertrophy  . Cancer [facility])   h/o renal carcinoma  . Chronic kidney disease   h/o UTI-2012 . Coronary artery disease   sees Dr. Shelva Majestic  . Diverticulitis  . GERD (gastroesophageal reflux disease)  . H/O epistaxis  . Heart disease  . Hypertension  . Myasthenia gravis [facility])   sees Dr. Jannifer Franklin . Obesity   exogenous . Osteoporosis  . Partial small bowel obstruction ([place]) 04/16/10  resolved  with bowel rest . Pulmonary embolus ([place])   2000 . Shortness of breath  . Urinary incontinence  . UTI (urinary tract infection)  Past Surgical History: Past Surgical History: Procedure Laterality Date . ANGIOPLASTY    had 2 stent replacements . CARDIAC CATHETERIZATION    2008, stents  . CARDIAC CATHETERIZATION   2008 . COLONOSCOPY  05/28/10  per Dr. Laurence Spates, diverticulosis, no repeats planned  . DOPPLER ECHOCARDIOGRAPHY   . EYE SURGERY    cataracts(bilateral)  removed, ?iol . JOINT REPLACEMENT    2005-R, L knee replacement- 2000 . NEPHRECTOMY    left . NM MYOVIEW LTD  02/2011  moderate inferior scar no ischemia . SP KYPHOPLASTY    and vertebroplasty . stented placed  1/08 . stress dipyridamole myocardial perfusion   . TONSILLECTOMY   . TOTAL KNEE ARTHROPLASTY    both Sees Dr. Novella Olive . TOTAL KNEE REVISION  03/28/2012  Procedure: TOTAL KNEE REVISION;  Surgeon: Hessie Dibble, MD;  Location: [place];  Service: Orthopedics;  Laterality: Right; HPI: 82 yo male adm to [facility] with weakness - recent biopsy for LUQ mass - pt was diagnosed with HCAP.  PMH + for GERD, renal cancer, Myasthenia Gravis on Mestinon.  CT head negative.  CXR left lower lobe opacity.  Swallow evaluation ordered.    Subjective: pt awake in bed Assessment / Plan / Recommendation CHL IP CLINICAL IMPRESSIONS 05/20/2016 Therapy Diagnosis Mild oral phase dysphagia;Mild pharyngeal phase dysphagia;Moderate pharyngeal phase dysphagia Clinical Impression Mild oral and mile-moderate pharyngeal dysphagia marked by lingual residue post swallow, decreased laryngeal elevation and closure resulting in silent aspiration during the swallow with thin. Mild vallecular and pyriform sinus residue from base of tongue weakness and decreased laryngeal elevation. Pt exhiibted decreased endurance and fatigue throughout assessment. Recommended pt continue with nectar and Dys 3 (due to oral mastication and transit delay), no straws, swallow twice intermittently, full supervision and meds whole in applesauce. Continue to follow.  Impact on safety and function Moderate aspiration risk   CHL IP TREATMENT RECOMMENDATION 05/20/2016 Treatment Recommendations Therapy as outlined in treatment plan below   Prognosis 05/20/2016 Prognosis for Safe Diet Advancement Fair Barriers to Reach Goals --  Barriers/Prognosis Comment -- CHL IP DIET RECOMMENDATION 05/20/2016 SLP Diet Recommendations Dysphagia 3 (Mech soft) solids;Nectar thick liquid Liquid Administration via Cup;No straw Medication Administration Whole meds with puree Compensations Slow rate;Small sips/bites;Multiple dry swallows after each bite/sip Postural Changes Seated upright at 90 degrees   CHL IP OTHER RECOMMENDATIONS 05/20/2016 Recommended Consults -- Oral Care Recommendations Oral care BID Other Recommendations --   CHL IP FOLLOW UP RECOMMENDATIONS 05/20/2016 Follow up Recommendations (No Data)   CHL IP FREQUENCY AND DURATION 05/20/2016 Speech Therapy Frequency (ACUTE ONLY) min 2x/week Treatment Duration 2 weeks      CHL IP ORAL PHASE 05/20/2016 Oral Phase Impaired Oral - Pudding Teaspoon -- Oral - Pudding Cup -- Oral - Honey Teaspoon -- Oral - Honey Cup -- Oral - Nectar Teaspoon -- Oral - Nectar Cup Lingual/palatal residue Oral - Nectar Straw -- Oral - Thin Teaspoon -- Oral - Thin Cup Lingual/palatal residue Oral - Thin Straw -- Oral - Puree -- Oral - Mech Soft -- Oral - Regular Lingual/palatal residue Oral - Multi-Consistency -- Oral - Pill -- Oral Phase - Comment --  CHL IP PHARYNGEAL PHASE 05/20/2016 Pharyngeal Phase Impaired Pharyngeal- Pudding Teaspoon -- Pharyngeal -- Pharyngeal- Pudding Cup -- Pharyngeal -- Pharyngeal- Honey Teaspoon -- Pharyngeal -- Pharyngeal- Honey Cup -- Pharyngeal -- Pharyngeal- Nectar Teaspoon --  Pharyngeal -- Pharyngeal- Nectar Cup Pharyngeal residue - valleculae;Pharyngeal residue - pyriform;Reduced tongue base retraction;Reduced laryngeal elevation Pharyngeal -- Pharyngeal- Nectar Straw -- Pharyngeal -- Pharyngeal- Thin Teaspoon -- Pharyngeal -- Pharyngeal- Thin Cup Penetration/Aspiration during swallow;Reduced laryngeal elevation;Reduced airway/laryngeal closure Pharyngeal Material enters airway, passes BELOW cords without attempt by patient to eject out (silent aspiration);Material enters airway, remains ABOVE  vocal cords then ejected out Pharyngeal- Thin Straw -- Pharyngeal -- Pharyngeal- Puree -- Pharyngeal -- Pharyngeal- Mechanical Soft -- Pharyngeal -- Pharyngeal- Regular Pharyngeal residue - valleculae;Reduced tongue base retraction Pharyngeal -- Pharyngeal- Multi-consistency -- Pharyngeal -- Pharyngeal- Pill -- Pharyngeal -- Pharyngeal Comment --  CHL IP CERVICAL ESOPHAGEAL PHASE 05/20/2016 Cervical Esophageal Phase WFL Pudding Teaspoon -- Pudding Cup -- Honey Teaspoon -- Honey Cup -- Nectar Teaspoon -- Nectar Cup -- Nectar Straw -- Thin Teaspoon -- Thin Cup -- Thin Straw -- Puree -- Mechanical Soft -- Regular -- Multi-consistency -- Pill -- Cervical Esophageal Comment -- CHL IP GO 05/14/2016 Functional Assessment Tool Used (None) Functional Limitations Swallowing Swallow Current Status [id number]) CL Swallow Goal Status [id number]) CK Swallow Discharge Status [id number]) (None) Motor Speech Current Status [id number]) (None) Motor Speech Goal Status [id number]) (None) Motor Speech Goal Status [id number]) (None) Spoken Language Comprehension Current Status [id number]) (None) Spoken Language Comprehension Goal Status [id number]) (None) Spoken Language Comprehension Discharge Status [id number]) (None) Spoken Language Expression Current Status PD:[id number]) (None) Spoken Language Expression Goal Status [id number]) (None) Spoken Language Expression Discharge Status FB:[id number]) (None) Attention Current Status LV:671222) (None) Attention Goal Status FV:388293) (None) Attention Discharge Status [id number]) (None) Memory Current Status AE:130515) (None) Memory Goal Status GI:463060) (None) Memory Discharge Status [id number]) (None) Voice Current Status [id number]) (None) Voice Goal Status SQ:4094147) (None) Voice Discharge Status [id number]) (None) Other Speech-Language Pathology Functional Limitation [id number]) (None) Other Speech-Language Pathology Functional Limitation Goal Status [id number]) (None) Other Speech-Language Pathology Functional Limitation Discharge Status [phone]) (None) Houston Siren  05/20/2016, 2:42 PM Orbie Pyo Colvin Caroli.Ed CCC-SLP Pager [phone]               Time Spent in minutes  30   Jani Gravel M.D on 05/23/2016 at 4:30 PM  Between 7am to 7pm - Pager - [phone]  After 7pm go to [email or web address] - password [email or web address]  [facility] -  Office  [phone]

## 2016-05-24 LAB — CBC
HCT: 30 % — ABNORMAL LOW (ref 39.0–52.0)
Hemoglobin: 9.6 g/dL — ABNORMAL LOW (ref 13.0–17.0)
MCH: 29.2 pg (ref 26.0–34.0)
MCHC: 32 g/dL (ref 30.0–36.0)
MCV: 91.2 fL (ref 78.0–100.0)
PLATELETS: 282 10*3/uL (ref 150–400)
RBC: 3.29 MIL/uL — AB (ref 4.22–5.81)
RDW: 16.5 % — ABNORMAL HIGH (ref 11.5–15.5)
WBC: 8 10*3/uL (ref 4.0–10.5)

## 2016-05-24 NOTE — Progress Notes (Signed)
Speech Language Pathology Treatment: Dysphagia  Patient Details Name: [name] MRN: [id number] DOB: Dec 27, 1924 Today's Date: 05/24/2016 Time: [id number] SLP Time Calculation (min) (ACUTE ONLY): 10 min  Assessment / Plan / Recommendation Clinical Impression  Pt agreeable to consume bite of graham cracker and nectar thick liquids. No indications aspiration present. Min verbal reminders for small sips and upright positioning during meals/snacks/meds. Will continue to follow while here.     HPI HPI: 82 yo male adm to [facility] with weakness - recent biopsy for LUQ mass - pt was diagnosed with HCAP.  PMH + for GERD, renal cancer, Myasthenia Gravis on Mestinon.  CT head negative.  CXR left lower lobe opacity.  Swallow evaluation ordered.         SLP Plan  Continue with current plan of care     Recommendations  Diet recommendations: Dysphagia 3 (mechanical soft);Nectar-thick liquid Liquids provided via: Cup;No straw Medication Administration: Whole meds with puree Supervision: Full supervision/cueing for compensatory strategies Compensations: Slow rate;Small sips/bites;Multiple dry swallows after each bite/sip Postural Changes and/or Swallow Maneuvers: Seated upright 90 degrees;Upright 30-60 min after meal             Oral Care Recommendations: Oral care BID Follow up Recommendations: Skilled Nursing facility Plan: Continue with current plan of care     GO                Houston Siren 05/24/2016, 4:47 PM  Orbie Pyo Colvin Caroli.Ed [employer] [phone]

## 2016-05-24 NOTE — Progress Notes (Signed)
Physical Therapy Treatment Patient Details Name: [name] MRN: [id number] DOB: 01-25-1925 Today's Date: 05/24/2016    History of Present Illness 82 y.o. male with medical history significant of osteoarthritis, BPH, renal carcinoma, chronic kidney disease, coronary artery disease, diverticulitis, GERD, hypertension, myasthenia gravis, obesity, osteoporosis, recently treated for UTI and admitted after pt developed sinus tachycardia in the 140s and 150s after getting a biopsy for LUQ mass with interventional radiology.  Found to have HCAP. and later imaging showed  Large acute left PCA infarct with petechial hemorrhage/small acute embolic infarcts in the cerebellum and right cerebral hemisphere in the setting of chronic ischemic small vessel cerebrovascular disease.  Pt transfered over to [facility]  Developed DVT in LE and on bedrest for 3 days to await therapeutic heparin levels.    PT Comments    Has improved over last session, but still quick to fatigue and then become much more difficult to mobilize.  Follow Up Recommendations  SNF;Supervision/Assistance - 24 hour     Equipment Recommendations  Rolling walker with 5" wheels    Recommendations for Other Services       Precautions / Restrictions Precautions Precautions: Fall    Mobility  Bed Mobility Overal bed mobility: Needs Assistance Bed Mobility: Supine to Sit     Supine to sit: Max assist;+2 for safety/equipment     General bed mobility comments: max assist to bridge then significant assist o help him come forward and scoot to the EOB  Transfers Overall transfer level: Needs assistance Equipment used: Rolling walker (2 wheeled) Transfers: Sit to/from [facility] Sit to Stand: Max assist;+2 physical assistance Stand pivot transfers: Max assist;+2 physical assistance       General transfer comment: stand pivot with face to face assist due to pt fatigued and doesn't feel he can make  it  Ambulation/Gait             General Gait Details: unable today   Stairs            Wheelchair Mobility    Modified Rankin (Stroke Patients Only) Modified Rankin (Stroke Patients Only) Pre-Morbid Rankin Score: No symptoms Modified Rankin: Severe disability     Balance Overall balance assessment: Needs assistance Sitting-balance support: No upper extremity supported;Single extremity supported Sitting balance-Leahy Scale: Fair     Standing balance support: Bilateral upper extremity supported Standing balance-Leahy Scale: Poor Standing balance comment: standing EOB x1 with ability to stand fully upright and work on w/shifting and lifting heels bil in prep for taking steps.  Quick to fatigue and unable to try second standing trial.                    Cognition Arousal/Alertness: Awake/alert Behavior During Therapy: Flat affect Overall Cognitive Status: Impaired/Different from baseline Area of Impairment: Attention;Following commands;Safety/judgement;Problem solving;Awareness   Current Attention Level: Sustained Memory: Decreased recall of precautions;Decreased short-term memory Following Commands: Follows one step commands with increased time Safety/Judgement: Decreased awareness of safety;Decreased awareness of deficits Awareness: Intellectual Problem Solving: Slow processing      Exercises Other Exercises Other Exercises: warm up knee /hip ROM exercise.    General Comments        Pertinent Vitals/Pain Pain Assessment: Faces Faces Pain Scale: No hurt    Home Living                      Prior Function            PT Goals (current goals  can now be found in the care plan section) Acute Rehab PT Goals Patient Stated Goal: pt wants to get his tests done and get home PT Goal Formulation: With patient Time For Goal Achievement: 05/28/16 Potential to Achieve Goals: Fair Progress towards PT goals: Progressing toward goals     Frequency  Min 3X/week    PT Plan Current plan remains appropriate    Co-evaluation             End of Session   Activity Tolerance: Patient limited by fatigue Patient left: in chair;with call bell/phone within reach;with chair alarm set (on lift pad)     Time: [id number] PT Time Calculation (min) (ACUTE ONLY): 33 min  Charges:  $Therapeutic Activity: 23-37 mins                    G Codes:      Tyshawn Keel, Tessie Fass 05/24/2016, 1:26 PM 05/24/2016  Donnella Sham, PT [phone]  (pager)

## 2016-05-24 NOTE — Progress Notes (Addendum)
Daily Progress Note   Patient Name: [name]       Date: 05/24/2016 DOB: 1925-01-25  Age: 82 y.o. MRN#: [id number] Attending Physician: Albertine Patricia, MD Primary Care Physician: [name], MD Admit Date: 05/12/2016  Reason for Consultation/Follow-up: Establishing goals of care  Subjective: Patient alert and oriented this morning. He can tell me he is at [facility] and had a stroke. Denies pain or discomfort. Following commands. Patient states he did eat breakfast. He does not remember working with physical therapy the other day.   Length of Stay: 10  Current Medications: Scheduled Meds:  . antiseptic oral rinse  7 mL Mouth Rinse BID  . enoxaparin (LOVENOX) injection  1 mg/kg Subcutaneous Q12H  . famotidine  20 mg Oral Daily  . guaiFENesin-dextromethorphan  10 mL Oral TID  . metoprolol succinate  50 mg Oral Daily  . multivitamin with minerals  1 tablet Oral Daily  . mycophenolate  1,000 mg Oral QHS  . mycophenolate  500 mg Oral Daily  . pyridostigmine  60 mg Oral QID  . sodium chloride flush  3 mL Intravenous Q12H  . tamsulosin  0.4 mg Oral Daily    Continuous Infusions: . sodium chloride 50 mL/hr at 05/24/16 0610    PRN Meds: food thickener, levalbuterol, polyethylene glycol, RESOURCE THICKENUP CLEAR, senna  Physical Exam  Constitutional: He is cooperative.  Cardiovascular: Regular rhythm and normal heart sounds.   Pulmonary/Chest: Effort normal. No accessory muscle usage. No tachypnea. No respiratory distress. He has decreased breath sounds.  Abdominal: Soft. Bowel sounds are normal.  Neurological: He is alert. GCS eye subscore is 4. GCS verbal subscore is 4. GCS motor subscore is 6.  Alert to person, place, and situation.   Psychiatric: He has a  normal mood and affect. His behavior is normal. His speech is delayed. Cognition and memory are impaired.            Vital Signs: BP (!) 128/58 (BP Location: Left Arm)   Pulse 88   Temp 98.2 F (36.8 C) (Oral)   Resp 16   Ht 5\' 10"  (1.778 m)   Wt 109.5 kg (241 lb 8 oz)   SpO2 94%   BMI 34.65 kg/m  SpO2: SpO2: 94 % O2 Device: O2 Device: Not Delivered O2 Flow Rate: O2  Flow Rate (L/min): 2 L/min  Intake/output summary:  Intake/Output Summary (Last 24 hours) at 05/24/16 1008 Last data filed at 05/24/16 [id number]  Gross per 24 hour  Intake              580 ml  Output              500 ml  Net               80 ml   LBM: Last BM Date: 05/23/16 Baseline Weight: Weight: 106.5 kg (234 lb 12.6 oz) Most recent weight: Weight: 109.5 kg (241 lb 8 oz)       Palliative Assessment/Data: PPS 30%   Flowsheet Rows   Flowsheet Row Most Recent Value  Intake Tab  Referral Department  Hospitalist  Unit at Time of Referral  Cardiac/Telemetry Unit  Palliative Care Primary Diagnosis  Neurology  Date Notified  05/17/16  Palliative Care Type  New Palliative care  Reason for referral  Clarify Goals of Care  Date of Admission  05/12/16  Date first seen by Palliative Care  05/18/16  # of days Palliative referral response time  1 Day(s)  # of days IP prior to Palliative referral  5  Clinical Assessment  Palliative Performance Scale Score  30%  Psychosocial & Spiritual Assessment  Social Work Plan of Care  Clarified patient/family wishes with healthcare team, Advance care planning  Palliative Care Outcomes  Patient/Family meeting held?  Yes  Who was at the meeting?  Sister [name]) and wife Marcia Brash)  Palliative Care Outcomes  Clarified goals of care, Provided advance care planning, Provided psychosocial or spiritual support  Patient/Family wishes: Interventions discontinued/not started   Tube feedings/TPN, PEG  Palliative Care follow-up planned  Yes, Facility      Patient Active Problem List     Diagnosis Date Noted  . DNR (do not resuscitate)   . Acute deep vein thrombosis (DVT) of right lower extremity ([place]) 05/18/2016  . Palliative care encounter   . DNR (do not resuscitate) discussion   . Goals of care, counseling/discussion   . Cerebrovascular accident (CVA) due to embolism of cerebral artery ([place])   . Acute thromboembolic cerebrovascular accident (CVA) ([place]) 05/17/2016  . Metastatic cancer ([place]) 05/17/2016  . Demand ischemia ([place]) 05/14/2016  . HCAP (healthcare-associated pneumonia) 05/13/2016  . 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 Portal vein thrombosis 05/13/2016  . Sinus tachycardia ([place]) 05/12/2016  . Abdominal pain 05/04/2016  . GI bleed 05/04/2016  . Abdominal mass, LUQ (left upper quadrant) 05/04/2016  . Normocytic anemia 05/04/2016  . CAD (coronary artery disease) 05/04/2016  . SIRS (systemic inflammatory response syndrome) ([place]) 05/04/2016  . Failure to thrive in adult 05/04/2016  . Urinary retention   . Pancreatic lesion 04/20/2016  . Recurrent UTI 04/20/2016  . First degree heart block 10/08/2015  . Environmental allergies 09/03/2013  . Hyperlipidemia with target LDL less than 70 07/25/2013  . Hx of unilateral nephrectomy 07/25/2013  . Failed total knee, right ([place]) 03/31/2012  . ABDOMINAL PAIN, ACUTE 04/16/2010  . ABDOMINAL PAIN, LOWER 04/16/2010  . COUGH 12/11/2009  . VIRAL GASTROENTERITIS 10/27/2009  . SKIN LESIONS, MULTIPLE 10/27/2009  . DEGENERATIVE JOINT DISEASE 10/27/2009  . RIB PAIN, RIGHT SIDED 08/13/2009  . TINEA CRURIS 06/11/2009  . UNSPECIFIED HEARING LOSS 06/11/2009  . LIPOMA 10/26/2007  . LEG CRAMPS 10/26/2007  . BPH (benign prostatic hyperplasia) 07/18/2007  . Personal history of renal cell carcinoma 03/27/2007  . MYASTHENIA GRAVIS W/O (ACUTE) EXACERBATION 03/27/2007  .  Essential hypertension 03/27/2007  . Coronary atherosclerosis 03/27/2007  . GERD 03/27/2007  . COLONIC POLYPS, BENIGN, HX OF 03/27/2007  . DIVERTICULITIS, HX  OF 03/27/2007    Palliative Care Assessment & Plan   Patient Profile: 82 y.o.malewith past medical history of osteoarthritis, BPH, renal carcinoma, chronic kidney disease, coronary artery disease, diverticulitis, GERD, hypertension, myasthenia gravis, obesity, partial SBO, PE, and recent UTI. He wasadmitted on 8/9/2017after developing sinus tachycardia during a LUQ biopsy at interventional radiology at [facility]. He was sent to [facility] ED for further workup. Admitted for HCAP and started on antibiotics. Patient remained somnolent, therefore Mestinon was increased for his myasthenia gravis. No change in mental status after increase in medication. MRI ordered and results showed large acute left PCA infarct with petechial hemorrhage and small acute embolic infarcts. Patient was transferred to [facility] for stroke workup. Echocardiogram, carotid ultrasound, and BLE venous dopplers completed. The patient is positive for a RLE DVT and started on heparin. Biopsy showed poorly differentiated carcinoma, ? Renal etiology with history of renal carcinoma/left nephrectomy. Neuro recommending SNF and have signed off. Speech therapy following and recommended dysphagia diet with nectar thick liquids. Physical therapy worked with patient, requiring a walker and two person assist from bed to chair. Heparin drip has been discontinued and patient transitioned to lovenox.   Assessment: Alert, oriented, and following commands. Denies pain.   Recommendations/Plan:  Continue PT/SLP.  DNR per family.  Recommending SNF with palliative services.  Palliative Medicine Team will sign off. Please do not hesitate to contact if we can be of further assistance.     Goals of Care and Additional Recommendations:  Limitations on Scope of Treatment: Full Scope Treatment  Code Status: DNR   Code Status Orders        Start     Ordered   05/21/16 1507  Do not attempt resuscitation (DNR)  Continuous      Question Answer Comment  In the event of cardiac or respiratory ARREST Do not call a "code blue"   In the event of cardiac or respiratory ARREST Do not perform Intubation, CPR, defibrillation or ACLS   In the event of cardiac or respiratory ARREST Use medication by any route, position, wound care, and other measures to relive pain and suffering. May use oxygen, suction and manual treatment of airway obstruction as needed for comfort.      05/21/16 1507    Code Status History    Date Active Date Inactive Code Status Order ID Comments User Context   05/20/2016 12:43 PM 05/21/2016  3:07 PM Partial Code [id number]  Melton Alar, PA-C Inpatient   05/20/2016 12:31 PM 05/20/2016 12:43 PM DNR [id number]  Melton Alar, PA-C Inpatient   05/12/2016  9:34 PM 05/20/2016 12:31 PM Full Code [id number]  Reubin Milan, MD Inpatient   05/04/2016  8:06 PM 05/06/2016  2:37 PM Full Code [id number]  Vianne Bulls, MD ED       Prognosis:   < 3 months-in the setting of acute stroke, bed bound, poor PO intake, and recurrence of renal carcinoma/abdominal malignancy.   Discharge Planning:  [place] for rehab with Palliative care service follow-up  Care plan was discussed with patient.   Thank you for allowing the Palliative Medicine Team to assist in the care of this patient.   Time In: 1000 Time Out: 1015 Total Time 80min Prolonged Time Billed  no       Greater than 50%  of  this time was spent counseling and coordinating care related to the above assessment and plan.  Ihor Dow, NP Palliative Medicine Team  Phone: [phone] Fax: [phone]  Please contact Palliative Medicine Team phone at [phone] for questions and concerns.

## 2016-05-24 NOTE — Care Management Important Message (Signed)
Important Message  Patient Details  Name: DEVONTRE CRIBARI MRN: [id number] Date of Birth: 1925-05-05   Medicare Important Message Given:  Yes    Crescentia Boutwell 05/24/2016, 11:59 AM

## 2016-05-24 NOTE — Progress Notes (Addendum)
Patient ID: [name], male   DOB: 04-01-1925, 82 y.o.   MRN: [id number]                                                                PROGRESS NOTE                                                                                                                                                                                                             Patient Demographics:    [name], is a 82 y.o. male, DOB - [birth year], [id number]  Admit date - 05/12/2016   Admitting Physician Reubin Milan, MD  Outpatient Primary MD for the patient is [name], MD  LOS - 10  Chief Complaint  Patient presents with  . Tachycardia       Brief Narrative   82 y.o. male  with past medical history of osteoarthritis, BPH, renal carcinoma, chronic kidney disease, coronary artery disease, diverticulitis, GERD, hypertension, myasthenia gravis, obesity, partial SBO, PE, and recent UTI. He was admitted on 05/12/2016 after developing sinus tachycardia during a LUQ biopsy at interventional radiology at [facility], Renal biopsy significant for undifferentiated carcinoma, thought to be from his prior renal cell cancer. Patient noticed to have lethargy, no improvement with Mrs. ischemia gravis medication adjustment, MRI significant for acute CVA, as well patient developed right lower extremity DVT, he thought to be having hypercoagulable state in the setting of cancer.   Subjective:    [name] today has, poor appeitite,  generalized weakness.  No headache, No chest pain, No abdominal pain - No Nausea, No new weakness tingling or numbness, No Cough - SOB.   Assessment  & Plan :    Principal Problem:   HCAP (healthcare-associated pneumonia) Active Problems:   MYASTHENIA GRAVIS W/O (ACUTE) EXACERBATION   Essential hypertension   GERD   BPH (benign prostatic hyperplasia)   Abdominal pain   Abdominal mass, LUQ (left upper quadrant)   Normocytic anemia   CAD (coronary artery  disease)   Sinus tachycardia (HCC)   Splenic vein thrombosis   Portal vein thrombosis   Demand ischemia (HCC)   Acute thromboembolic cerebrovascular accident (CVA) ([place])   Metastatic cancer ([place])   Palliative care encounter   DNR (do not resuscitate) discussion   Goals  of care, counseling/discussion   Cerebrovascular accident (CVA) due to embolism of cerebral artery ([place])   Acute deep vein thrombosis (DVT) of right lower extremity ([place])   DNR (do not resuscitate)   Acute ischemic CVA - Bilateral anterior and posterior infarct, felt to be embolic most likely due to hypercoagulable state secondary to malignancy. - Neurology input appreciated, felt to be paradoxical emboli with DVT and PFO also is a possibility - Due to right lower shin with DVT, Plavix has been stopped, currently on full dose Lovenox anticoagulation.  Right lower extremity DVT - Likely due to hypercoagulable state secondary to malignancy - Initially on IV heparin, currently transitioned to Lovenox  Left upper quadrant abdominal mass - This is most likely due to malignancy, most likely metastatic renal carcinoma status post nephrectomy. - Discussed with Dr. Alen Blew, patient with significant debilitation, and unstable for further treatment in this point, to follow as an outpatient regarding further plan.  Myasthenia gravis - Neurology on board, medication has been increased to generalized weakness  Pneumonia - Finished 7 days on IV vancomycin and cefepime      DVT Prophylaxis  :  lovenox  Code Status :  DNR  Family Communication  : None at bedside  Disposition Plan  : SNF:   Consults  :  Palliative care and urology  Procedures  :  Biopsy 05/12/2016  DVT Prophylaxis  :  Lovenox,  Lab Results  Component Value Date   PLT 282 05/24/2016    Antibiotics  :    Anti-infectives    Start     Dose/Rate Route Frequency Ordered Stop   05/15/16 0900  vancomycin (VANCOCIN) IVPB 1000 mg/200 mL premix      1,000 mg 200 mL/hr over 60 Minutes Intravenous Every 12 hours 05/15/16 0818 05/19/16 1224   05/13/16 0800  vancomycin (VANCOCIN) IVPB 750 mg/150 ml premix  Status:  Discontinued     750 mg 150 mL/hr over 60 Minutes Intravenous Every 12 hours 05/12/16 2208 05/15/16 0818   05/13/16 0600  ceFEPIme (MAXIPIME) 1 g in dextrose 5 % 50 mL IVPB     1 g 100 mL/hr over 30 Minutes Intravenous Every 8 hours 05/12/16 2134 05/19/16 2202   05/12/16 2300  vancomycin (VANCOCIN) 2,000 mg in sodium chloride 0.9 % 500 mL IVPB     2,000 mg 250 mL/hr over 120 Minutes Intravenous  Once 05/12/16 2208 05/13/16 0230   05/12/16 2230  ceFEPIme (MAXIPIME) 2 g in dextrose 5 % 50 mL IVPB     2 g 100 mL/hr over 30 Minutes Intravenous  Once 05/12/16 2208 05/13/16 0003   05/12/16 2200  amoxicillin-clavulanate (AUGMENTIN) 250-125 MG per tablet 1 tablet  Status:  Discontinued     1 tablet Oral 3 times daily 05/12/16 2134 05/12/16 2206        Objective:   Vitals:   05/23/16 2140 05/24/16 0118 05/24/16 0510 05/24/16 0852  BP: (!) 128/59 (!) 145/58 (!) 122/57 (!) 128/58  Pulse: 85 87 87 88  Resp: 20 18 18 16   Temp: 97.7 F (36.5 C) 97.5 F (36.4 C) 98.3 F (36.8 C) 98.2 F (36.8 C)  TempSrc: Oral Oral Oral Oral  SpO2: 94% 95% 95% 94%  Weight:      Height:        Wt Readings from Last 3 Encounters:  05/17/16 109.5 kg (241 lb 8 oz)  05/12/16 106.2 kg (234 lb 3.2 oz)  05/06/16 106.2 kg (234 lb 3.2 oz)  Intake/Output Summary (Last 24 hours) at 05/24/16 1149 Last data filed at 05/24/16 0814  Gross per 24 hour  Intake              580 ml  Output              500 ml  Net               80 ml     Physical Exam  Awake Alert, Frail. Supple Neck,No JVD Symmetrical Chest wall movement, Good air movement bilaterally, CTAB RRR,No Gallops,Rubs or new Murmurs, No Parasternal Heave +ve B.Sounds, Abd Soft, No tenderness, No rebound - guarding or rigidity. No Cyanosis, Clubbing or edema, No new Rash or bruise       Data Review:    CBC  Recent Labs Lab 05/20/16 0638 05/21/16 0533 05/22/16 0232 05/23/16 0215 05/24/16 0524  WBC 7.9 6.5 7.4 7.5 8.0  HGB 10.9* 9.6* 10.5* 10.3* 9.6*  HCT 32.6* 29.1* 31.8* 31.1* 30.0*  PLT 307 297 308 295 282  MCV 90.6 89.8 89.3 91.2 91.2  MCH 30.3 29.6 29.5 30.2 29.2  MCHC 33.4 33.0 33.0 33.1 32.0  RDW 15.8* 15.8* 15.9* 16.4* 16.5*    Chemistries   Recent Labs Lab 05/18/16 1216 05/19/16 0027 05/22/16 0232  NA 132* 133* 132*  K 4.2 4.0 4.0  CL 104 103 102  CO2 22 24 23   GLUCOSE 93 105* 94  BUN 14 15 19   CREATININE 0.76 0.83 0.82  CALCIUM 8.1* 8.2* 8.2*  MG 2.0  --   --   AST  --  55*  --   ALT  --  25  --   ALKPHOS  --  175*  --   BILITOT  --  0.8  --    ------------------------------------------------------------------------------------------------------------------ No results for input(s): CHOL, HDL, LDLCALC, TRIG, CHOLHDL, LDLDIRECT in the last 72 hours.  Lab Results  Component Value Date   HGBA1C 5.9 (H) 05/18/2016   ------------------------------------------------------------------------------------------------------------------ No results for input(s): TSH, T4TOTAL, T3FREE, THYROIDAB in the last 72 hours.  Invalid input(s): FREET3 ------------------------------------------------------------------------------------------------------------------ No results for input(s): VITAMINB12, FOLATE, FERRITIN, TIBC, IRON, RETICCTPCT in the last 72 hours.  Coagulation profile No results for input(s): INR, PROTIME in the last 168 hours.  No results for input(s): DDIMER in the last 72 hours.  Cardiac Enzymes No results for input(s): CKMB, TROPONINI, MYOGLOBIN in the last 168 hours.  Invalid input(s): CK ------------------------------------------------------------------------------------------------------------------ No results found for: BNP  Inpatient Medications  Scheduled Meds: . antiseptic oral rinse  7 mL Mouth Rinse BID  .  enoxaparin (LOVENOX) injection  1 mg/kg Subcutaneous Q12H  . famotidine  20 mg Oral Daily  . guaiFENesin-dextromethorphan  10 mL Oral TID  . metoprolol succinate  50 mg Oral Daily  . multivitamin with minerals  1 tablet Oral Daily  . mycophenolate  1,000 mg Oral QHS  . mycophenolate  500 mg Oral Daily  . pyridostigmine  60 mg Oral QID  . sodium chloride flush  3 mL Intravenous Q12H  . tamsulosin  0.4 mg Oral Daily   Continuous Infusions: . sodium chloride 50 mL/hr at 05/24/16 0610   PRN Meds:.food thickener, levalbuterol, polyethylene glycol, RESOURCE THICKENUP CLEAR, senna  Micro Results Recent Results (from the past 240 hour(s))  Culture, blood (Routine X 2) w Reflex to ID Panel     Status: None   Collection Time: 05/18/16 12:30 PM  Result Value Ref Range Status   Specimen Description BLOOD LEFT ARM  Final  Special Requests IN PEDIATRIC BOTTLE 2 CC  Final   Culture NO GROWTH 5 DAYS  Final   Report Status 05/23/2016 FINAL  Final  Culture, blood (Routine X 2) w Reflex to ID Panel     Status: None   Collection Time: 05/18/16  3:26 PM  Result Value Ref Range Status   Specimen Description BLOOD LEFT ANTECUBITAL  Final   Special Requests BOTTLES DRAWN AEROBIC AND ANAEROBIC 5CC EACH  Final   Culture NO GROWTH 5 DAYS  Final   Report Status 05/23/2016 FINAL  Final    Radiology Reports Ct Abdomen Pelvis Wo Contrast  Result Date: 05/04/2016 CLINICAL DATA:  Constipation for 1 day, difficulty urinating for 1 day, abdominal pain, history GERD, BPH, GERD, small-bowel obstruction, hypertension, pulmonary embolism, myasthenia gravis, renal cell carcinoma, coronary artery disease, former smoker EXAM: CT ABDOMEN AND PELVIS WITHOUT CONTRAST TECHNIQUE: Multidetector CT imaging of the abdomen and pelvis was performed following the standard protocol without IV contrast. Sagittal and coronal MPR images reconstructed from axial data set. Neither oral nor intravenous contrast were administered for this  study. COMPARISON:  CT abdomen 03/03/2016 FINDINGS: Lower chest: Bibasilar atelectasis, increased. Peribronchial thickening particularly LEFT lower lobe. Mild interstitial prominence at lung bases little changed. Hepatobiliary: Minimal gallbladder wall calcification. Gallstone seen previously not definitely visualized on current study. No definite focal hepatic lesions. Pancreas: Slight enlargement of the pancreatic tail with ductal dilatation seen on previous exam less well visualized due the lack of IV contrast. Pancreatic appearance little changed. Spleen: No definite intra splenic abnormalities Adrenals/Urinary Tract: Normal-appearing RIGHT adrenal gland. LEFT adrenal gland not visualized. Normal RIGHT kidney and RIGHT ureter. Foley catheter within decompressed urinary bladder. Bladder wall appears thickened though this may be an artifact from collapsed state. Minimal prostatic enlargement. Post LEFT nephrectomy. Large soft tissue mass LEFT upper quadrant 17.6 x 10.7 x 8.6 cm in size, mixed attenuation with areas of low-attenuation centrally and irregular soft tissue attenuation peripherally. Mass abuts anterior margin of spleen, lateral margin of stomach, and pancreatic tail but epicenter of mass appears external to these structures. Surrounding irregular infiltrative changes of LEFT upper quadrant fat with increased number of vessels. Overlying hernia of soft tissue and fat screw surgical defect in the LEFT flank abdominal wall without bowel herniation. This could represent lymphoma or potentially recurrent renal carcinoma. Stomach/Bowel: Diverticulosis of sigmoid and descending colon without definite evidence of diverticulitis. Stomach decompressed limiting assessment of wall thickness. Bowel loops otherwise unremarkable. Appendix not definitely visualized. Vascular/Lymphatic: Mesenteric collaterals/varices particularly RIGHT of midline. Atherosclerotic calcifications aorta, iliac arteries, femoral arteries,  coronary arteries, celiac artery, and splenic artery. Aorta normal caliber. No adenopathy. Reproductive: N/A Other: No free air or free fluid. Musculoskeletal: Diffuse osseous demineralization. Multiple thoracolumbar compression fractures with prior spinal augmentation procedures at 3 levels. IMPRESSION: Post LEFT nephrectomy. Marked interval increase in size of a soft tissue mass in the LEFT upper quadrant with question central necrosis, now measuring 17.6 x 10.7 x 8.6 cm, favor lymphoma or recurrent renal cell carcinoma ; while this mass abuts the pancreatic tail, lateral wall of stomach, and anterior margin of spleen, the lesion appears to have an epicenter external to these organs. LEFT flank hernia through nephrectomy scar. Chronic enlargement of pancreatic tail, by prior CT consisting of ductal dilatation and mild parenchymal prominence, less well visualized on current noncontrast technique exam. Distal colonic diverticulosis. Aortic atherosclerosis with additional atherosclerotic changes as above. Mesenteric collaterals/varices. Electronically Signed   By: Lavonia Dana M.D.   On:  05/04/2016 17:46   Dg Chest 2 View  Result Date: 05/12/2016 CLINICAL DATA:  82 year old male with weakness and tachycardia. EXAM: CHEST  2 VIEW COMPARISON:  05/04/2016 and prior radiographs FINDINGS: This is a low volume film. Increased left lower lung opacity may represent atelectasis or airspace disease/ pneumonia. Mild pulmonary vascular congestion is noted. Upper limits normal heart size noted. No pneumothorax or definite pleural effusion noted. No acute bony abnormalities are identified. IMPRESSION: Low volume film with increased left lower lung opacity-question atelectasis versus airspace disease. Mild pulmonary vascular congestion. Electronically Signed   By: Margarette Canada M.D.   On: 05/12/2016 18:05   Dg Chest 2 View  Result Date: 05/04/2016 CLINICAL DATA:  82 year old male with fever. EXAM: CHEST  2 VIEW COMPARISON:   11/22/2015 FINDINGS: Lower lung volumes leading to bronchovascular crowding and accentuation of the heart size. There is tortuosity and atherosclerosis of the thoracic aorta, unchanged from prior allowing for differences in technique. Bibasilar atelectasis, no confluent airspace disease allowing for low lung volumes. No pleural effusion or pneumothorax. Multiple vertebral compression deformities with kyphoplasty. IMPRESSION: Low lung volumes with bronchovascular crowding and bibasilar atelectasis. No evidence of focal pneumonia on low volume chest. Electronically Signed   By: Jeb Levering M.D.   On: 05/04/2016 19:54   Ct Head Wo Contrast  Result Date: 05/12/2016 CLINICAL DATA:  Weakness. EXAM: CT HEAD WITHOUT CONTRAST TECHNIQUE: Contiguous axial images were obtained from the base of the skull through the vertex without intravenous contrast. COMPARISON:  11/22/2013 FINDINGS: Brain: No evidence of acute infarction, hemorrhage, hydrocephalus, or mass lesion/mass effect. Remote small bilateral cerebellar infarcts which appears stable from previous. Remote right posterior temporal and parietal cortically based infarct. Small remote bilateral occipital and right posterior frontal cortical infarcts. Small-vessel ischemic change in the cerebral white matter. Vascular: Atherosclerotic calcification. Opacified vessels from recent enhanced abdominal CT. Skull: Negative for fracture or focal lesion. Sinuses/Orbits: Left sphenoid sinusitis is chronic based on prior, with fluid level currently. Bilateral cataract resection Other: None. IMPRESSION: 1. No acute intracranial finding or change compared to 2015. 2. Numerous remote infarcts as described above. 3. Chronic left sphenoid sinusitis.  Fluid level is present today. Electronically Signed   By: Monte Fantasia M.D.   On: 05/12/2016 18:18   Ct Chest W Contrast  Result Date: 05/05/2016 CLINICAL DATA:  Renal cancer.  Evaluate for metastatic disease. EXAM: CT CHEST WITH  CONTRAST TECHNIQUE: Multidetector CT imaging of the chest was performed during intravenous contrast administration. CONTRAST:  14mL ISOVUE-300 IOPAMIDOL (ISOVUE-300) INJECTION 61% COMPARISON:  11/09/2006 FINDINGS: Cardiovascular: Heart is enlarged. No pericardial effusion. Coronary artery calcification is noted. Mediastinum/Nodes: Scattered upper normal size mediastinal lymph nodes are not substantially changed in the interval. One of the larger lymph nodes is a 10 mm short axis subcarinal node that is unchanged in the interval. There is no hilar lymphadenopathy. The esophagus has normal imaging features. There is no axillary lymphadenopathy. Lungs/Pleura: There is chronic scarring in the left lung base with volume loss and bronchiectasis, progressed in the interval. Superimposed atelectasis or progressive scar noted on today's exam. Superimposed airspace infection cannot be excluded. Volume loss with bronchiectasis noted posterior right lower lobe and in the lingula. No discrete pulmonary nodules to raise significant concern for pulmonary metastatic disease in this patient with a history of renal cell carcinoma. No substantial pleural effusion. Upper Abdomen: Large left upper quadrant heterogeneous soft tissue mass better seen on recent CT scan of the abdomen and pelvis. Musculoskeletal: Patient is status  post multilevel thoracolumbar vertebral augmentation with un treated compression deformity seen at the T11 vertebral body. IMPRESSION: 1. No definite CT findings to suggest metastatic disease to the chest. 2. Bilateral lower lobe, left greater than right, collapse/consolidation with scarring. 3. Coronary artery atherosclerosis. Electronically Signed   By: Misty Stanley M.D.   On: 05/05/2016 10:31   Mr Jeri Cos [id number] Contrast  Result Date: 05/17/2016 CLINICAL DATA:  Slurred speech. EXAM: MRI HEAD WITHOUT AND WITH CONTRAST TECHNIQUE: Multiplanar, multiecho pulse sequences of the brain and surrounding structures were  obtained without and with intravenous contrast. CONTRAST:  42mL MULTIHANCE GADOBENATE DIMEGLUMINE 529 MG/ML IV SOLN COMPARISON:  Head CT 05/12/2016 and MRI 02/04/2004 FINDINGS: The study is moderately motion degraded despite repeated imaging and utilizing faster, more motion resistant protocols. There is a large acute left PCA infarct involving the temporal and occipital lobes. A small amount of confluent petechial hemorrhage is present in the anterior left occipital lobe. Small acute infarcts are present in both cerebral hemispheres, right caudate nucleus, and right frontal, right occipital, and right parietal lobe cortex. There are chronic cortical infarcts involving the right parietal greater than right frontal lobes. Small volume chronic blood products are noted in the right parietal lobe. There are chronic bilateral cerebellar infarcts. T2 hyperintensities elsewhere in the deep cerebral white matter bilaterally are nonspecific but compatible with mild-to-moderate chronic small vessel ischemic disease. There is moderate cerebral atrophy. No mass, midline shift, or extra-axial fluid collection is seen. No abnormal enhancement is identified within limitations of motion. Prior bilateral cataract extraction is noted. Circumferential mucosal thickening and fluid are present in the left sphenoid sinus. The mastoid air cells are clear. Major intracranial vascular flow voids are grossly preserved with vertebrobasilar and ICA dolichoectasia. IMPRESSION: 1. Large acute left PCA infarct with petechial hemorrhage. 2. Small acute embolic infarcts in the cerebellum and right cerebral hemisphere. 3. Chronic ischemic changes as above. Electronically Signed   By: Logan Bores M.D.   On: 05/17/2016 14:16   Ct Abdomen Pelvis W Contrast  Result Date: 05/12/2016 CLINICAL DATA:  had a biopsy of a left upper quadrant mass today. tachycardia (he was in the 140-150 range after procedure); he arrives here drowsy and in no distress  with a heart rate of ~125 and is a regular supraventricular tach. Family are with him also. He is drowsy and is slow to arouse and answers all questions regarding his situation lucidly. He has an indwelling foley cath. In place and a sm. Clean dressing at left lower rib area EXAM: CT ABDOMEN AND PELVIS WITH CONTRAST TECHNIQUE: Multidetector CT imaging of the abdomen and pelvis was performed using the standard protocol following bolus administration of intravenous contrast. CONTRAST:  173mL ISOVUE-300 IOPAMIDOL (ISOVUE-300) INJECTION 61% COMPARISON:  Biopsy images from earlier the same day, and prior studies FINDINGS: Lower chest: Trace bilateral pleural effusions. Consolidation/ atelectasis posteriorly in the visualized lower lobes. Coronary calcifications. Hepatobiliary: Fatty liver without focal lesion. Partially calcified stones in the nondilated gallbladder. Pancreas: Mild atrophy in the mid body and head. There is masslike enlargement of pancreatic tail and a contiguous complex 17.8 cm mass with peripheral enhancement, central low-attenuation, stable compared to scans dating back to 05/04/2016. No evidence of hematoma or active extravasation. Spleen: The left upper quadrant mass abuts the splenic hilum. There are small wedge-shaped areas of decreased enhancement in the spleen suggesting direct involvement by the mass. Splenic vein thrombosis. Adrenals/Urinary Tract: Previous left nephrectomy. Right adrenal gland and kidney unremarkable. Foley catheter decompresses  urinary bladder. Stomach/Bowel: Stomach is decompressed, displaced medially by at the left upper quadrant mass without definite invasion. Small bowel and colon are nondilated. Innumerable sigmoid diverticula without adjacent inflammatory/edematous change. Vascular/Lymphatic: Moderate aortoiliac arterial calcifications without aneurysm or stenosis. Cavernous transformation of the portal vein. Apparent thrombosis of the splenic vein. SMV remains patent.  Reproductive: No mass or other significant abnormality. Other: Scattered low-attenuation ascites in the pelvis and pericolic gutters left greater than right. No suggestion of hemoperitoneum. Musculoskeletal: Left lateral body wall hernia containing only fat. Multiple stable lower thoracic and lumbar compression deformities, post vertebral augmentation at T10 and L1. IMPRESSION: 1. Negative for hemorrhage or other acute complication of left upper quadrant mass biopsy. Critical Value/emergent results were called by telephone at the time of interpretation on 05/12/2016 at 5:03 pm to Dr. Laurence Ferrari, who verbally acknowledged these results. 2. Stable left upper quadrant mass abutting the pancreas, probably invading the splenic hilum, with splenic and portal vein thrombosis. 3. Additional ancillary findings as above, stable since previous exam. Electronically Signed   By: Lucrezia Europe M.D.   On: 05/12/2016 17:04   Ct Biopsy  Result Date: 05/12/2016 INDICATION: 82 year old male with a prior history of renal cell carcinoma and a large left upper quadrant mass concerning for recurrent disease. He presents for biopsy. EXAM: CT BIOPSY MEDICATIONS: None. ANESTHESIA/SEDATION: Moderate (conscious) sedation was employed during this procedure. A total of Versed 1 mg and Fentanyl 50 mcg was administered intravenously. Moderate Sedation Time: 10 minutes. The patient's level of consciousness and vital signs were monitored continuously by radiology nursing throughout the procedure under my direct supervision. FLUOROSCOPY TIME:  None COMPLICATIONS: None immediate. PROCEDURE: Informed written consent was obtained from the patient after a thorough discussion of the procedural risks, benefits and alternatives. All questions were addressed. A timeout was performed prior to the initiation of the procedure. A planning axial CT scan was performed. The large left upper quadrant abdominal mass was identified. A suitable skin entry site was  selected and marked. The region was sterilely prepped and draped in standard fashion using chlorhexidine skin prep. Local anesthesia was attained by infiltration with 1% lidocaine. A small dermatotomy was made. Using intermittent CT guidance, a 19 gauge introducer needle was advanced into the margin of the mass. The introducer needle was positioned to biopsy needle rind of the lesion has centrally it is low attenuation and likely necrotic. Multiple 18 gauge core biopsies were then coaxially obtained using the bio Pince automated biopsy device. Biopsy specimens were placed in saline and delivered to pathology for further analysis. Following removal of the biopsy device and introducer needle, and axial CT scan was performed which demonstrates no hematoma or other complicating feature. The patient tolerated the procedure well. IMPRESSION: Technically successful CT-guided biopsy of left upper quadrant abdominal mass. Of note, the mass is centrally necrotic. Efforts were made to biopsy the peripheral rim of the lesion in hopes of obtaining viable tissue for diagnosis. Electronically Signed   By: Jacqulynn Cadet M.D.   On: 05/12/2016 13:03   Dg Swallowing Func-speech Pathology  Result Date: 05/20/2016 Objective Swallowing Evaluation: Type of Study: MBS-Modified Barium Swallow Study Patient Details Name: ASTOR DOLCH MRN: [id number] Date of Birth: 22-Jun-1925 Today's Date: 05/20/2016 Time: SLP Start Time (ACUTE ONLY): 1328-SLP Stop Time (ACUTE ONLY): 1345 SLP Time Calculation (min) (ACUTE ONLY): 17 min Past Medical History: Past Medical History: Diagnosis Date . Arthritis   back, hips, knees  . Benign prostatic hypertrophy  . Cancer ([place])  h/o renal carcinoma  . Chronic kidney disease   h/o UTI-2012 . Coronary artery disease   sees Dr. Shelva Majestic  . Diverticulitis  . GERD (gastroesophageal reflux disease)  . H/O epistaxis  . Heart disease  . Hypertension  . Myasthenia gravis [facility])   sees Dr. Jannifer Franklin . Obesity    exogenous . Osteoporosis  . Partial small bowel obstruction ([place]) 04/16/10  resolved with bowel rest . Pulmonary embolus ([place])   2000 . Shortness of breath  . Urinary incontinence  . UTI (urinary tract infection)  Past Surgical History: Past Surgical History: Procedure Laterality Date . ANGIOPLASTY    had 2 stent replacements . CARDIAC CATHETERIZATION    2008, stents  . CARDIAC CATHETERIZATION  2008 . COLONOSCOPY  05/28/10  per Dr. Laurence Spates, diverticulosis, no repeats planned  . DOPPLER ECHOCARDIOGRAPHY   . EYE SURGERY    cataracts(bilateral)  removed, ?iol . JOINT REPLACEMENT    2005-R, L knee replacement- 2000 . NEPHRECTOMY    left . NM MYOVIEW LTD  02/2011  moderate inferior scar no ischemia . SP KYPHOPLASTY    and vertebroplasty . stented placed  1/08 . stress dipyridamole myocardial perfusion   . TONSILLECTOMY   . TOTAL KNEE ARTHROPLASTY    both Sees Dr. Novella Olive . TOTAL KNEE REVISION  03/28/2012  Procedure: TOTAL KNEE REVISION;  Surgeon: Hessie Dibble, MD;  Location: [place];  Service: Orthopedics;  Laterality: Right; HPI: 82 yo male adm to [facility] with weakness - recent biopsy for LUQ mass - pt was diagnosed with HCAP.  PMH + for GERD, renal cancer, Myasthenia Gravis on Mestinon.  CT head negative.  CXR left lower lobe opacity.  Swallow evaluation ordered.    Subjective: pt awake in bed Assessment / Plan / Recommendation CHL IP CLINICAL IMPRESSIONS 05/20/2016 Therapy Diagnosis Mild oral phase dysphagia;Mild pharyngeal phase dysphagia;Moderate pharyngeal phase dysphagia Clinical Impression Mild oral and mile-moderate pharyngeal dysphagia marked by lingual residue post swallow, decreased laryngeal elevation and closure resulting in silent aspiration during the swallow with thin. Mild vallecular and pyriform sinus residue from base of tongue weakness and decreased laryngeal elevation. Pt exhiibted decreased endurance and fatigue throughout assessment. Recommended pt continue with nectar and Dys 3 (due to oral  mastication and transit delay), no straws, swallow twice intermittently, full supervision and meds whole in applesauce. Continue to follow.  Impact on safety and function Moderate aspiration risk   CHL IP TREATMENT RECOMMENDATION 05/20/2016 Treatment Recommendations Therapy as outlined in treatment plan below   Prognosis 05/20/2016 Prognosis for Safe Diet Advancement Fair Barriers to Reach Goals -- Barriers/Prognosis Comment -- CHL IP DIET RECOMMENDATION 05/20/2016 SLP Diet Recommendations Dysphagia 3 (Mech soft) solids;Nectar thick liquid Liquid Administration via Cup;No straw Medication Administration Whole meds with puree Compensations Slow rate;Small sips/bites;Multiple dry swallows after each bite/sip Postural Changes Seated upright at 90 degrees   CHL IP OTHER RECOMMENDATIONS 05/20/2016 Recommended Consults -- Oral Care Recommendations Oral care BID Other Recommendations --   CHL IP FOLLOW UP RECOMMENDATIONS 05/20/2016 Follow up Recommendations (No Data)   CHL IP FREQUENCY AND DURATION 05/20/2016 Speech Therapy Frequency (ACUTE ONLY) min 2x/week Treatment Duration 2 weeks      CHL IP ORAL PHASE 05/20/2016 Oral Phase Impaired Oral - Pudding Teaspoon -- Oral - Pudding Cup -- Oral - Honey Teaspoon -- Oral - Honey Cup -- Oral - Nectar Teaspoon -- Oral - Nectar Cup Lingual/palatal residue Oral - Nectar Straw -- Oral - Thin Teaspoon -- Oral - Thin Cup Lingual/palatal  residue Oral - Thin Straw -- Oral - Puree -- Oral - Mech Soft -- Oral - Regular Lingual/palatal residue Oral - Multi-Consistency -- Oral - Pill -- Oral Phase - Comment --  CHL IP PHARYNGEAL PHASE 05/20/2016 Pharyngeal Phase Impaired Pharyngeal- Pudding Teaspoon -- Pharyngeal -- Pharyngeal- Pudding Cup -- Pharyngeal -- Pharyngeal- Honey Teaspoon -- Pharyngeal -- Pharyngeal- Honey Cup -- Pharyngeal -- Pharyngeal- Nectar Teaspoon -- Pharyngeal -- Pharyngeal- Nectar Cup Pharyngeal residue - valleculae;Pharyngeal residue - pyriform;Reduced tongue base  retraction;Reduced laryngeal elevation Pharyngeal -- Pharyngeal- Nectar Straw -- Pharyngeal -- Pharyngeal- Thin Teaspoon -- Pharyngeal -- Pharyngeal- Thin Cup Penetration/Aspiration during swallow;Reduced laryngeal elevation;Reduced airway/laryngeal closure Pharyngeal Material enters airway, passes BELOW cords without attempt by patient to eject out (silent aspiration);Material enters airway, remains ABOVE vocal cords then ejected out Pharyngeal- Thin Straw -- Pharyngeal -- Pharyngeal- Puree -- Pharyngeal -- Pharyngeal- Mechanical Soft -- Pharyngeal -- Pharyngeal- Regular Pharyngeal residue - valleculae;Reduced tongue base retraction Pharyngeal -- Pharyngeal- Multi-consistency -- Pharyngeal -- Pharyngeal- Pill -- Pharyngeal -- Pharyngeal Comment --  CHL IP CERVICAL ESOPHAGEAL PHASE 05/20/2016 Cervical Esophageal Phase WFL Pudding Teaspoon -- Pudding Cup -- Honey Teaspoon -- Honey Cup -- Nectar Teaspoon -- Nectar Cup -- Nectar Straw -- Thin Teaspoon -- Thin Cup -- Thin Straw -- Puree -- Mechanical Soft -- Regular -- Multi-consistency -- Pill -- Cervical Esophageal Comment -- CHL IP GO 05/14/2016 Functional Assessment Tool Used (None) Functional Limitations Swallowing Swallow Current Status [id number]) CL Swallow Goal Status [id number]) CK Swallow Discharge Status [id number]) (None) Motor Speech Current Status [id number]) (None) Motor Speech Goal Status [id number]) (None) Motor Speech Goal Status [id number]) (None) Spoken Language Comprehension Current Status [id number]) (None) Spoken Language Comprehension Goal Status [id number]) (None) Spoken Language Comprehension Discharge Status [id number]) (None) Spoken Language Expression Current Status [id number]) (None) Spoken Language Expression Goal Status [id number]) (None) Spoken Language Expression Discharge Status [id number]) (None) Attention Current Status [id number]) (None) Attention Goal Status [id number]) (None) Attention Discharge Status [id number]) (None) Memory Current Status [id number]) (None) Memory Goal Status [id number]) (None)  Memory Discharge Status [id number]) (None) Voice Current Status [id number]) (None) Voice Goal Status [id number]) (None) Voice Discharge Status [id number]) (None) Other Speech-Language Pathology Functional Limitation [id number]) (None) Other Speech-Language Pathology Functional Limitation Goal Status [id number]) (None) Other Speech-Language Pathology Functional Limitation Discharge Status [id number]) (None) [place] Siren 05/20/2016, 2:42 PM Orbie Pyo Colvin Caroli.Ed CCC-SLP Pager [phone]                [employer], Wisdom Seybold M.D on 05/24/2016 at 11:49 AM  Between 7am to 7pm - Pager - [phone]  After 7pm go to [email or web address] - password [facility]  [facility] -  Office  [phone]

## 2016-05-25 DIAGNOSIS — I6340 Cerebral infarction due to embolism of unspecified cerebral artery: Secondary | ICD-10-CM

## 2016-05-25 LAB — CBC
HCT: 30.3 % — ABNORMAL LOW (ref 39.0–52.0)
Hemoglobin: 9.7 g/dL — ABNORMAL LOW (ref 13.0–17.0)
MCH: 29.8 pg (ref 26.0–34.0)
MCHC: 32 g/dL (ref 30.0–36.0)
MCV: 93.2 fL (ref 78.0–100.0)
PLATELETS: 276 10*3/uL (ref 150–400)
RBC: 3.25 MIL/uL — AB (ref 4.22–5.81)
RDW: 16.8 % — ABNORMAL HIGH (ref 11.5–15.5)
WBC: 5.9 10*3/uL (ref 4.0–10.5)

## 2016-05-25 MED ORDER — STARCH (THICKENING) PO POWD: ORAL | 0 refills | Status: AC

## 2016-05-25 MED ORDER — POLYETHYLENE GLYCOL 3350 17 G PO PACK: 17.0000 g | PACK | Freq: Every day | ORAL | Status: AC | PRN

## 2016-05-25 MED ORDER — FAMOTIDINE 20 MG PO TABS: 20.0000 mg | ORAL_TABLET | Freq: Every day | ORAL | Status: AC

## 2016-05-25 MED ORDER — ENOXAPARIN SODIUM 150 MG/ML ~~LOC~~ SOLN: 1.0000 mg/kg | Freq: Two times a day (BID) | SUBCUTANEOUS | Status: AC

## 2016-05-25 MED ORDER — GUAIFENESIN-DM 100-10 MG/5ML PO SYRP: 10.0000 mL | ORAL_SOLUTION | Freq: Three times a day (TID) | ORAL | 0 refills | Status: AC | PRN

## 2016-05-25 MED ORDER — LEVALBUTEROL HCL 0.63 MG/3ML IN NEBU: 0.6300 mg | INHALATION_SOLUTION | Freq: Four times a day (QID) | RESPIRATORY_TRACT | 12 refills | Status: AC | PRN

## 2016-05-25 NOTE — Discharge Summary (Addendum)
[name], is a 82 y.o. male  DOB [birth year]  MRN [id number].  Admission date:  05/12/2016  Admitting Physician  Gerald Milan, MD  Discharge Date:  05/25/2016   Primary MD  Gerald Morale, MD  Recommendations for primary care physician for things to follow:  - Please check CBC, BMP in 3 days - Monitor CBC closely as he is on full dose anticoagulation. - CODE STATUS DO NOT RESUSCITATE  Admission Diagnosis  Tachycardia    Discharge Diagnosis  Tachycardia     Principal Problem:   HCAP (healthcare-associated pneumonia) Active Problems:   MYASTHENIA GRAVIS W/O (ACUTE) EXACERBATION   Essential hypertension   GERD   BPH (benign prostatic hyperplasia)   Abdominal pain   Abdominal mass, LUQ (left upper quadrant)   Normocytic anemia   CAD (coronary artery disease)   Sinus tachycardia ([place])   Splenic vein thrombosis   Portal vein thrombosis   Demand ischemia ([place])   Acute thromboembolic cerebrovascular accident (CVA) ([place])   Metastatic cancer ([place])   Palliative care encounter   DNR (do not resuscitate) discussion   Goals of care, counseling/discussion   Cerebrovascular accident (CVA) due to embolism of cerebral artery ([place])   Acute deep vein thrombosis (DVT) of right lower extremity ([place])   DNR (do not resuscitate)      Past Medical History:  Diagnosis Date  . Arthritis    back, hips, knees   . Benign prostatic hypertrophy   . Cancer [facility])    h/o renal carcinoma   . Chronic kidney disease    h/o UTI-2012  . Coronary artery disease    sees Dr. Shelva Nolan   . Diverticulitis   . GERD (gastroesophageal reflux disease)   . H/O epistaxis   . Heart disease   . Hypertension   . Myasthenia gravis [facility])    sees [name]  . Obesity    exogenous  . Osteoporosis   . Partial small bowel obstruction ([place]) 04/16/10   resolved with bowel rest  . Pulmonary embolus ([place])    2000  .  Shortness of breath   . Urinary incontinence   . UTI (urinary tract infection)     Past Surgical History:  Procedure Laterality Date  . ANGIOPLASTY     had 2 stent replacements  . CARDIAC CATHETERIZATION     2008, stents   . CARDIAC CATHETERIZATION  2008  . COLONOSCOPY  05/28/10   per Dr. Laurence Nolan, diverticulosis, no repeats planned   . DOPPLER ECHOCARDIOGRAPHY    . EYE SURGERY     cataracts(bilateral)  removed, ?iol  . JOINT REPLACEMENT     2005-R, L knee replacement- 2000  . NEPHRECTOMY     left  . NM MYOVIEW LTD  02/2011   moderate inferior scar no ischemia  . SP KYPHOPLASTY     and vertebroplasty  . stented placed  1/08  . stress dipyridamole myocardial perfusion    . TONSILLECTOMY    . TOTAL KNEE ARTHROPLASTY  both Sees Dr. Novella Nolan  . TOTAL KNEE REVISION  03/28/2012   Procedure: TOTAL KNEE REVISION;  Surgeon: Gerald Dibble, MD;  Location: [place];  Service: Orthopedics;  Laterality: Right;       History of present illness and  Hospital Course:     Kindly see H&P for history of present illness and admission details, please review complete Labs, Consult reports and Test reports for all details in brief  HPI  from the history and physical done on the day of admission 05/12/2016  HPI: [name] is a 82 y.o. male with medical history significant of osteoarthritis, BPH, renal carcinoma, chronic kidney disease, coronary artery disease, diverticulitis, GERD, hypertension, myasthenia gravis, obesity, osteoporosis, recently treated for UTI per patient's sisters, who was transferred from to the ER from the radiology department after the patient developed sinus tachycardia in the 140s and 150s after getting a biopsy for LUQ mass with interventional radiology. He denies chest pain, dyspnea at that moment, palpitations, dizziness, diaphoresis, pitting edema of the lower extremities. The patient did not take his metoprolol this morning and did not sleep much last night due  to anxiety in anticipation of the procedure.  On review of systems, the patient has been having progressively worse productive cough of yellowish sputum of 7 days or more, mild dyspnea, fatigue, chills, but denies fever, wheezing, sore throat or rhinorrhea. He has chronic abdominal pain, but denies nausea, emesis, diarrhea or recent constipation. He just recently finished a course of Augmentin, apparently for UTI.   ED Course: The patient was given IV fluids and 50 mg of metoprolol orally. His heart rate is currently in the 80s. Work up shows EKG with sinus tachycardia, elevated troponin at 0.11 ng/mL, hemoglobin level of 11.0 g/dL. Chest radiograph showed left lower lobe infiltrate versus atelectasis.   Hospital Course  82 y.o.malewith past medical history of osteoarthritis, BPH, renal carcinoma, chronic kidney disease, coronary artery disease, diverticulitis, GERD, hypertension, myasthenia gravis, obesity, partial SBO, PE, and recent UTI. He wasadmitted on 8/9/2017after developing sinus tachycardia during a LUQ biopsy at interventional radiology at [facility], Renal biopsy significant for undifferentiated carcinoma, thought to be from his prior renal cell cancer. Patient noticed to have lethargy, no improvement with Mrs. ischemia gravis medication adjustment, MRI significant for acute CVA, as well patient developed right lower extremity DVT, he thought to be having hypercoagulable state in the setting of cancer.  Acute ischemic CVA - Bilateral anterior and posterior infarct, felt to be embolic most likely due to hypercoagulable state secondary to malignancy. - Neurology input appreciated, felt to be paradoxical emboli with DVT and PFO also is a possibility - Due to right lower shin with DVT, Plavix has been stopped, currently on full dose Lovenox anticoagulation.  Right lower extremity DVT - Likely due to hypercoagulable state secondary to malignancy - Initially on IV  heparin, currently transitioned to Lovenox  Left upper quadrant abdominal mass - This is most likely due to malignancy, most likely metastatic renal carcinoma status post nephrectomy. - Discussed with Dr. Alen Nolan, patient with significant debilitation, and unstable for further treatment in this point, to follow as an outpatient regarding further plans if his debility improves.  Myasthenia gravis - Seen by neurology, continue with home medication Pneumonia - Finished 7 days on IV vancomycin and cefepime    Discharge Condition:  Stable for discharge, but overall poor prognosis.   Follow UP  Follow-up Information    SHADAD,FIRAS, MD .   Specialty:  Oncology Contact information: [place]. [place] [address] [phone]             Discharge Instructions  and  Discharge Medications    Discharge Instructions    Ambulatory referral to Neurology    Complete by:  As directed   Stroke patient. Followed by DR. Willis for MG. For follow up with him, 2 months   Discharge instructions    Complete by:  As directed   Follow with Primary MD Gerald Morale, MD after discharge from SNF  Get CBC, CMP,  checked  by Primary MD next visit.    Activity: As tolerated with Full fall precautions use walker/cane & assistance as needed   Disposition SNF   Diet: Dysphagia 3 with nectar thick, with feeding assistance and aspiration precautions.  For Heart failure patients - Check your Weight same time everyday, if you gain over 2 pounds, or you develop in leg swelling, experience more shortness of breath or chest pain, call your Primary MD immediately. Follow Cardiac Low Salt Diet and 1.5 lit/day fluid restriction.   On your next visit with your primary care physician please Get Medicines reviewed and adjusted.   Please request your Prim.MD to go over all Hospital Tests and Procedure/Radiological results at the follow up, please get all Hospital records sent to your Prim MD by  signing hospital release before you go home.   If you experience worsening of your admission symptoms, develop shortness of breath, life threatening emergency, suicidal or homicidal thoughts you must seek medical attention immediately by calling 911 or calling your MD immediately  if symptoms less severe.  You Must read complete instructions/literature along with all the possible adverse reactions/side effects for all the Medicines you take and that have been prescribed to you. Take any new Medicines after you have completely understood and accpet all the possible adverse reactions/side effects.   Do not drive, operating heavy machinery, perform activities at heights, swimming or participation in water activities or provide baby sitting services if your were admitted for syncope or siezures until you have seen by Primary MD or a Neurologist and advised to do so again.  Do not drive when taking Pain medications.    Do not take more than prescribed Pain, Sleep and Anxiety Medications  Special Instructions: If you have smoked or chewed Tobacco  in the last 2 yrs please stop smoking, stop any regular Alcohol  and or any Recreational drug use.  Wear Seat belts while driving.   Please note  You were cared for by a hospitalist during your hospital stay. If you have any questions about your discharge medications or the care you received while you were in the hospital after you are discharged, you can call the unit and asked to speak with the hospitalist on call if the hospitalist that took care of you is not available. Once you are discharged, your primary care physician will handle any further medical issues. Please note that NO REFILLS for any discharge medications will be authorized once you are discharged, as it is imperative that you return to your primary care physician (or establish a relationship with a primary care physician if you do not have one) for your aftercare needs so that they can  reassess your need for medications and monitor your lab values.   Increase activity slowly    Complete by:  As directed       Medication List    STOP taking these medications  amoxicillin-clavulanate 250-125 MG tablet Commonly known as:  AUGMENTIN   clopidogrel 75 MG tablet Commonly known as:  PLAVIX   phenazopyridine 200 MG tablet Commonly known as:  PYRIDIUM   rosuvastatin 10 MG tablet Commonly known as:  CRESTOR     TAKE these medications   enoxaparin 150 MG/ML injection Commonly known as:  LOVENOX Inject 0.73 mLs (110 mg total) into the skin every 12 (twelve) hours.   famotidine 20 MG tablet Commonly known as:  PEPCID Take 1 tablet (20 mg total) by mouth daily.   food thickener Powd Commonly known as:  THICK IT use with all liquids   guaiFENesin-dextromethorphan 100-10 MG/5ML syrup Commonly known as:  ROBITUSSIN DM Take 10 mLs by mouth every 8 (eight) hours as needed for cough.   levalbuterol 0.63 MG/3ML nebulizer solution Commonly known as:  XOPENEX Take 3 mLs (0.63 mg total) by nebulization every 6 (six) hours as needed for wheezing or shortness of breath.   metoprolol succinate 50 MG 24 hr tablet Commonly known as:  TOPROL-XL TAKE 1 TABLET ONCE DAILY.   multivitamin with minerals Tabs tablet Take 1 tablet by mouth daily.   mycophenolate 500 MG tablet Commonly known as:  CELLCEPT TAKE 1 TABLET EVERY MORNING AND 2 TABLETS AT BEDTIME.   polyethylene glycol packet Commonly known as:  MIRALAX / GLYCOLAX Take 17 g by mouth daily as needed for moderate constipation.   pyridostigmine 60 MG tablet Commonly known as:  MESTINON TAKE  (1)  TABLET  FOUR TIMES DAILY. What changed:  See the new instructions.   tamsulosin 0.4 MG Caps capsule Commonly known as:  FLOMAX Take 1 capsule (0.4 mg total) by mouth daily.         Diet and Activity recommendation: See Discharge Instructions above   Consults obtained -   Palliative  medicine Neurology Oncology  Major procedures and Radiology Reports - PLEASE review detailed and final reports for all details, in brief -  Biopsy 05/12/2016   Ct Abdomen Pelvis Wo Contrast  Result Date: 05/04/2016 CLINICAL DATA:  Constipation for 1 day, difficulty urinating for 1 day, abdominal pain, history GERD, BPH, GERD, small-bowel obstruction, hypertension, pulmonary embolism, myasthenia gravis, renal cell carcinoma, coronary artery disease, former smoker EXAM: CT ABDOMEN AND PELVIS WITHOUT CONTRAST TECHNIQUE: Multidetector CT imaging of the abdomen and pelvis was performed following the standard protocol without IV contrast. Sagittal and coronal MPR images reconstructed from axial data set. Neither oral nor intravenous contrast were administered for this study. COMPARISON:  CT abdomen 03/03/2016 FINDINGS: Lower chest: Bibasilar atelectasis, increased. Peribronchial thickening particularly LEFT lower lobe. Mild interstitial prominence at lung bases little changed. Hepatobiliary: Minimal gallbladder wall calcification. Gallstone seen previously not definitely visualized on current study. No definite focal hepatic lesions. Pancreas: Slight enlargement of the pancreatic tail with ductal dilatation seen on previous exam less well visualized due the lack of IV contrast. Pancreatic appearance little changed. Spleen: No definite intra splenic abnormalities Adrenals/Urinary Tract: Normal-appearing RIGHT adrenal gland. LEFT adrenal gland not visualized. Normal RIGHT kidney and RIGHT ureter. Foley catheter within decompressed urinary bladder. Bladder wall appears thickened though this may be an artifact from collapsed state. Minimal prostatic enlargement. Post LEFT nephrectomy. Large soft tissue mass LEFT upper quadrant 17.6 x 10.7 x 8.6 cm in size, mixed attenuation with areas of low-attenuation centrally and irregular soft tissue attenuation peripherally. Mass abuts anterior margin of spleen, lateral margin of  stomach, and pancreatic tail but epicenter of mass appears external to these structures. Surrounding irregular  infiltrative changes of LEFT upper quadrant fat with increased number of vessels. Overlying hernia of soft tissue and fat screw surgical defect in the LEFT flank abdominal wall without bowel herniation. This could represent lymphoma or potentially recurrent renal carcinoma. Stomach/Bowel: Diverticulosis of sigmoid and descending colon without definite evidence of diverticulitis. Stomach decompressed limiting assessment of wall thickness. Bowel loops otherwise unremarkable. Appendix not definitely visualized. Vascular/Lymphatic: Mesenteric collaterals/varices particularly RIGHT of midline. Atherosclerotic calcifications aorta, iliac arteries, femoral arteries, coronary arteries, celiac artery, and splenic artery. Aorta normal caliber. No adenopathy. Reproductive: N/A Other: No free air or free fluid. Musculoskeletal: Diffuse osseous demineralization. Multiple thoracolumbar compression fractures with prior spinal augmentation procedures at 3 levels. IMPRESSION: Post LEFT nephrectomy. Marked interval increase in size of a soft tissue mass in the LEFT upper quadrant with question central necrosis, now measuring 17.6 x 10.7 x 8.6 cm, favor lymphoma or recurrent renal cell carcinoma ; while this mass abuts the pancreatic tail, lateral wall of stomach, and anterior margin of spleen, the lesion appears to have an epicenter external to these organs. LEFT flank hernia through nephrectomy scar. Chronic enlargement of pancreatic tail, by prior CT consisting of ductal dilatation and mild parenchymal prominence, less well visualized on current noncontrast technique exam. Distal colonic diverticulosis. Aortic atherosclerosis with additional atherosclerotic changes as above. Mesenteric collaterals/varices. Electronically Signed   By: Lavonia Dana M.D.   On: 05/04/2016 17:46   Dg Chest 2 View  Result Date:  05/12/2016 CLINICAL DATA:  82 year old male with weakness and tachycardia. EXAM: CHEST  2 VIEW COMPARISON:  05/04/2016 and prior radiographs FINDINGS: This is a low volume film. Increased left lower lung opacity may represent atelectasis or airspace disease/ pneumonia. Mild pulmonary vascular congestion is noted. Upper limits normal heart size noted. No pneumothorax or definite pleural effusion noted. No acute bony abnormalities are identified. IMPRESSION: Low volume film with increased left lower lung opacity-question atelectasis versus airspace disease. Mild pulmonary vascular congestion. Electronically Signed   By: Margarette Canada M.D.   On: 05/12/2016 18:05   Dg Chest 2 View  Result Date: 05/04/2016 CLINICAL DATA:  82 year old male with fever. EXAM: CHEST  2 VIEW COMPARISON:  11/22/2015 FINDINGS: Lower lung volumes leading to bronchovascular crowding and accentuation of the heart size. There is tortuosity and atherosclerosis of the thoracic aorta, unchanged from prior allowing for differences in technique. Bibasilar atelectasis, no confluent airspace disease allowing for low lung volumes. No pleural effusion or pneumothorax. Multiple vertebral compression deformities with kyphoplasty. IMPRESSION: Low lung volumes with bronchovascular crowding and bibasilar atelectasis. No evidence of focal pneumonia on low volume chest. Electronically Signed   By: Jeb Levering M.D.   On: 05/04/2016 19:54   Ct Head Wo Contrast  Result Date: 05/12/2016 CLINICAL DATA:  Weakness. EXAM: CT HEAD WITHOUT CONTRAST TECHNIQUE: Contiguous axial images were obtained from the base of the skull through the vertex without intravenous contrast. COMPARISON:  11/22/2013 FINDINGS: Brain: No evidence of acute infarction, hemorrhage, hydrocephalus, or mass lesion/mass effect. Remote small bilateral cerebellar infarcts which appears stable from previous. Remote right posterior temporal and parietal cortically based infarct. Small remote  bilateral occipital and right posterior frontal cortical infarcts. Small-vessel ischemic change in the cerebral white matter. Vascular: Atherosclerotic calcification. Opacified vessels from recent enhanced abdominal CT. Skull: Negative for fracture or focal lesion. Sinuses/Orbits: Left sphenoid sinusitis is chronic based on prior, with fluid level currently. Bilateral cataract resection Other: None. IMPRESSION: 1. No acute intracranial finding or change compared to 2015. 2. Numerous remote infarcts  as described above. 3. Chronic left sphenoid sinusitis.  Fluid level is present today. Electronically Signed   By: Monte Fantasia M.D.   On: 05/12/2016 18:18   Ct Chest W Contrast  Result Date: 05/05/2016 CLINICAL DATA:  Renal cancer.  Evaluate for metastatic disease. EXAM: CT CHEST WITH CONTRAST TECHNIQUE: Multidetector CT imaging of the chest was performed during intravenous contrast administration. CONTRAST:  69mL ISOVUE-300 IOPAMIDOL (ISOVUE-300) INJECTION 61% COMPARISON:  11/09/2006 FINDINGS: Cardiovascular: Heart is enlarged. No pericardial effusion. Coronary artery calcification is noted. Mediastinum/Nodes: Scattered upper normal size mediastinal lymph nodes are not substantially changed in the interval. One of the larger lymph nodes is a 10 mm short axis subcarinal node that is unchanged in the interval. There is no hilar lymphadenopathy. The esophagus has normal imaging features. There is no axillary lymphadenopathy. Lungs/Pleura: There is chronic scarring in the left lung base with volume loss and bronchiectasis, progressed in the interval. Superimposed atelectasis or progressive scar noted on today's exam. Superimposed airspace infection cannot be excluded. Volume loss with bronchiectasis noted posterior right lower lobe and in the lingula. No discrete pulmonary nodules to raise significant concern for pulmonary metastatic disease in this patient with a history of renal cell carcinoma. No substantial  pleural effusion. Upper Abdomen: Large left upper quadrant heterogeneous soft tissue mass better seen on recent CT scan of the abdomen and pelvis. Musculoskeletal: Patient is status post multilevel thoracolumbar vertebral augmentation with un treated compression deformity seen at the T11 vertebral body. IMPRESSION: 1. No definite CT findings to suggest metastatic disease to the chest. 2. Bilateral lower lobe, left greater than right, collapse/consolidation with scarring. 3. Coronary artery atherosclerosis. Electronically Signed   By: Misty Stanley M.D.   On: 05/05/2016 10:31   Mr Jeri Cos [id number] Contrast  Result Date: 05/17/2016 CLINICAL DATA:  Slurred speech. EXAM: MRI HEAD WITHOUT AND WITH CONTRAST TECHNIQUE: Multiplanar, multiecho pulse sequences of the brain and surrounding structures were obtained without and with intravenous contrast. CONTRAST:  40mL MULTIHANCE GADOBENATE DIMEGLUMINE 529 MG/ML IV SOLN COMPARISON:  Head CT 05/12/2016 and MRI 02/04/2004 FINDINGS: The study is moderately motion degraded despite repeated imaging and utilizing faster, more motion resistant protocols. There is a large acute left PCA infarct involving the temporal and occipital lobes. A small amount of confluent petechial hemorrhage is present in the anterior left occipital lobe. Small acute infarcts are present in both cerebral hemispheres, right caudate nucleus, and right frontal, right occipital, and right parietal lobe cortex. There are chronic cortical infarcts involving the right parietal greater than right frontal lobes. Small volume chronic blood products are noted in the right parietal lobe. There are chronic bilateral cerebellar infarcts. T2 hyperintensities elsewhere in the deep cerebral white matter bilaterally are nonspecific but compatible with mild-to-moderate chronic small vessel ischemic disease. There is moderate cerebral atrophy. No mass, midline shift, or extra-axial fluid collection is seen. No abnormal  enhancement is identified within limitations of motion. Prior bilateral cataract extraction is noted. Circumferential mucosal thickening and fluid are present in the left sphenoid sinus. The mastoid air cells are clear. Major intracranial vascular flow voids are grossly preserved with vertebrobasilar and ICA dolichoectasia. IMPRESSION: 1. Large acute left PCA infarct with petechial hemorrhage. 2. Small acute embolic infarcts in the cerebellum and right cerebral hemisphere. 3. Chronic ischemic changes as above. Electronically Signed   By: Logan Bores M.D.   On: 05/17/2016 14:16   Ct Abdomen Pelvis W Contrast  Result Date: 05/12/2016 CLINICAL DATA:  had a biopsy of  a left upper quadrant mass today. tachycardia (he was in the 140-150 range after procedure); he arrives here drowsy and in no distress with a heart rate of ~125 and is a regular supraventricular tach. Family are with him also. He is drowsy and is slow to arouse and answers all questions regarding his situation lucidly. He has an indwelling foley cath. In place and a sm. Clean dressing at left lower rib area EXAM: CT ABDOMEN AND PELVIS WITH CONTRAST TECHNIQUE: Multidetector CT imaging of the abdomen and pelvis was performed using the standard protocol following bolus administration of intravenous contrast. CONTRAST:  156mL ISOVUE-300 IOPAMIDOL (ISOVUE-300) INJECTION 61% COMPARISON:  Biopsy images from earlier the same day, and prior studies FINDINGS: Lower chest: Trace bilateral pleural effusions. Consolidation/ atelectasis posteriorly in the visualized lower lobes. Coronary calcifications. Hepatobiliary: Fatty liver without focal lesion. Partially calcified stones in the nondilated gallbladder. Pancreas: Mild atrophy in the mid body and head. There is masslike enlargement of pancreatic tail and a contiguous complex 17.8 cm mass with peripheral enhancement, central low-attenuation, stable compared to scans dating back to 05/04/2016. No evidence of  hematoma or active extravasation. Spleen: The left upper quadrant mass abuts the splenic hilum. There are small wedge-shaped areas of decreased enhancement in the spleen suggesting direct involvement by the mass. Splenic vein thrombosis. Adrenals/Urinary Tract: Previous left nephrectomy. Right adrenal gland and kidney unremarkable. Foley catheter decompresses urinary bladder. Stomach/Bowel: Stomach is decompressed, displaced medially by at the left upper quadrant mass without definite invasion. Small bowel and colon are nondilated. Innumerable sigmoid diverticula without adjacent inflammatory/edematous change. Vascular/Lymphatic: Moderate aortoiliac arterial calcifications without aneurysm or stenosis. Cavernous transformation of the portal vein. Apparent thrombosis of the splenic vein. SMV remains patent. Reproductive: No mass or other significant abnormality. Other: Scattered low-attenuation ascites in the pelvis and pericolic gutters left greater than right. No suggestion of hemoperitoneum. Musculoskeletal: Left lateral body wall hernia containing only fat. Multiple stable lower thoracic and lumbar compression deformities, post vertebral augmentation at T10 and L1. IMPRESSION: 1. Negative for hemorrhage or other acute complication of left upper quadrant mass biopsy. Critical Value/emergent results were called by telephone at the time of interpretation on 05/12/2016 at 5:03 pm to Dr. Laurence Ferrari, who verbally acknowledged these results. 2. Stable left upper quadrant mass abutting the pancreas, probably invading the splenic hilum, with splenic and portal vein thrombosis. 3. Additional ancillary findings as above, stable since previous exam. Electronically Signed   By: Lucrezia Europe M.D.   On: 05/12/2016 17:04   Ct Biopsy  Result Date: 05/12/2016 INDICATION: 82 year old male with a prior history of renal cell carcinoma and a large left upper quadrant mass concerning for recurrent disease. He presents for biopsy. EXAM:  CT BIOPSY MEDICATIONS: None. ANESTHESIA/SEDATION: Moderate (conscious) sedation was employed during this procedure. A total of Versed 1 mg and Fentanyl 50 mcg was administered intravenously. Moderate Sedation Time: 10 minutes. The patient's level of consciousness and vital signs were monitored continuously by radiology nursing throughout the procedure under my direct supervision. FLUOROSCOPY TIME:  None COMPLICATIONS: None immediate. PROCEDURE: Informed written consent was obtained from the patient after a thorough discussion of the procedural risks, benefits and alternatives. All questions were addressed. A timeout was performed prior to the initiation of the procedure. A planning axial CT scan was performed. The large left upper quadrant abdominal mass was identified. A suitable skin entry site was selected and marked. The region was sterilely prepped and draped in standard fashion using chlorhexidine skin prep. Local anesthesia was  attained by infiltration with 1% lidocaine. A small dermatotomy was made. Using intermittent CT guidance, a 19 gauge introducer needle was advanced into the margin of the mass. The introducer needle was positioned to biopsy needle rind of the lesion has centrally it is low attenuation and likely necrotic. Multiple 18 gauge core biopsies were then coaxially obtained using the bio Pince automated biopsy device. Biopsy specimens were placed in saline and delivered to pathology for further analysis. Following removal of the biopsy device and introducer needle, and axial CT scan was performed which demonstrates no hematoma or other complicating feature. The patient tolerated the procedure well. IMPRESSION: Technically successful CT-guided biopsy of left upper quadrant abdominal mass. Of note, the mass is centrally necrotic. Efforts were made to biopsy the peripheral rim of the lesion in hopes of obtaining viable tissue for diagnosis. Electronically Signed   By: Jacqulynn Cadet M.D.   On:  05/12/2016 13:03   Dg Swallowing Func-speech Pathology  Result Date: 05/20/2016 Objective Swallowing Evaluation: Type of Study: MBS-Modified Barium Swallow Study Patient Details Name: JOHNNYRAY FAZZI MRN: [id number] Date of Birth: [birth year] Today's Date: 05/20/2016 Time: SLP Start Time (ACUTE ONLY): 1328-SLP Stop Time (ACUTE ONLY): 1345 SLP Time Calculation (min) (ACUTE ONLY): 17 min Past Medical History: Past Medical History: Diagnosis Date . Arthritis   back, hips, knees  . Benign prostatic hypertrophy  . Cancer [facility])   h/o renal carcinoma  . Chronic kidney disease   h/o UTI-2012 . Coronary artery disease   sees Dr. Shelva Nolan  . Diverticulitis  . GERD (gastroesophageal reflux disease)  . H/O epistaxis  . Heart disease  . Hypertension  . Myasthenia gravis [facility])   sees [name] . Obesity   exogenous . Osteoporosis  . Partial small bowel obstruction ([place]) 04/16/10  resolved with bowel rest . Pulmonary embolus ([place])   2000 . Shortness of breath  . Urinary incontinence  . UTI (urinary tract infection)  Past Surgical History: Past Surgical History: Procedure Laterality Date . ANGIOPLASTY    had 2 stent replacements . CARDIAC CATHETERIZATION    2008, stents  . CARDIAC CATHETERIZATION  2008 . COLONOSCOPY  05/28/10  per Dr. Laurence Nolan, diverticulosis, no repeats planned  . DOPPLER ECHOCARDIOGRAPHY   . EYE SURGERY    cataracts(bilateral)  removed, ?iol . JOINT REPLACEMENT    2005-R, L knee replacement- 2000 . NEPHRECTOMY    left . NM MYOVIEW LTD  02/2011  moderate inferior scar no ischemia . SP KYPHOPLASTY    and vertebroplasty . stented placed  1/08 . stress dipyridamole myocardial perfusion   . TONSILLECTOMY   . TOTAL KNEE ARTHROPLASTY    both Sees Dr. Novella Nolan . TOTAL KNEE REVISION  03/28/2012  Procedure: TOTAL KNEE REVISION;  Surgeon: Gerald Dibble, MD;  Location: [facility];  Service: Orthopedics;  Laterality: Right; HPI: 82 yo male adm [facility] with weakness - recent biopsy for LUQ mass - pt was diagnosed with HCAP.   PMH + for GERD, renal cancer, Myasthenia Gravis on M[place].  CT head negative.  CXR left lower lobe opacity.  Swallow evaluation ordered.    Subjective: pt awake in bed Assessment / Plan / Recommendation CHL IP CLINICAL IMPRESSIONS 05/20/2016 Therapy Diagnosis Mild oral phase dysphagia;Mild pharyngeal phase dysphagia;Moderate pharyngeal phase dysphagia Clinical Impression Mild oral and mile-moderate pharyngeal dysphagia marked by lingual residue post swallow, decreased laryngeal elevation and closure resulting in silent aspiration during the swallow with thin. Mild vallecular and pyriform sinus residue from base of  tongue weakness and decreased laryngeal elevation. Pt exhiibted decreased endurance and fatigue throughout assessment. Recommended pt continue with nectar and Dys 3 (due to oral mastication and transit delay), no straws, swallow twice intermittently, full supervision and meds whole in applesauce. Continue to follow.  Impact on safety and function Moderate aspiration risk   CHL IP TREATMENT RECOMMENDATION 05/20/2016 Treatment Recommendations Therapy as outlined in treatment plan below   Prognosis 05/20/2016 Prognosis for Safe Diet Advancement Fair Barriers to Reach Goals -- Barriers/Prognosis Comment -- CHL IP DIET RECOMMENDATION 05/20/2016 SLP Diet Recommendations Dysphagia 3 (Mech soft) solids;Nectar thick liquid Liquid Administration via Cup;No straw Medication Administration Whole meds with puree Compensations Slow rate;Small sips/bites;Multiple dry swallows after each bite/sip Postural Changes Seated upright at 90 degrees   CHL IP OTHER RECOMMENDATIONS 05/20/2016 Recommended Consults -- Oral Care Recommendations Oral care BID Other Recommendations --   CHL IP FOLLOW UP RECOMMENDATIONS 05/20/2016 Follow up Recommendations (No Data)   CHL IP FREQUENCY AND DURATION 05/20/2016 Speech Therapy Frequency (ACUTE ONLY) min 2x/week Treatment Duration 2 weeks      CHL IP ORAL PHASE 05/20/2016 Oral Phase Impaired Oral  - Pudding Teaspoon -- Oral - Pudding Cup -- Oral - Honey Teaspoon -- Oral - Honey Cup -- Oral - Nectar Teaspoon -- Oral - Nectar Cup Lingual/palatal residue Oral - Nectar Straw -- Oral - Thin Teaspoon -- Oral - Thin Cup Lingual/palatal residue Oral - Thin Straw -- Oral - Puree -- Oral - Mech Soft -- Oral - Regular Lingual/palatal residue Oral - Multi-Consistency -- Oral - Pill -- Oral Phase - Comment --  CHL IP PHARYNGEAL PHASE 05/20/2016 Pharyngeal Phase Impaired Pharyngeal- Pudding Teaspoon -- Pharyngeal -- Pharyngeal- Pudding Cup -- Pharyngeal -- Pharyngeal- Honey Teaspoon -- Pharyngeal -- Pharyngeal- Honey Cup -- Pharyngeal -- Pharyngeal- Nectar Teaspoon -- Pharyngeal -- Pharyngeal- Nectar Cup Pharyngeal residue - valleculae;Pharyngeal residue - pyriform;Reduced tongue base retraction;Reduced laryngeal elevation Pharyngeal -- Pharyngeal- Nectar Straw -- Pharyngeal -- Pharyngeal- Thin Teaspoon -- Pharyngeal -- Pharyngeal- Thin Cup Penetration/Aspiration during swallow;Reduced laryngeal elevation;Reduced airway/laryngeal closure Pharyngeal Material enters airway, passes BELOW cords without attempt by patient to eject out (silent aspiration);Material enters airway, remains ABOVE vocal cords then ejected out Pharyngeal- Thin Straw -- Pharyngeal -- Pharyngeal- Puree -- Pharyngeal -- Pharyngeal- Mechanical Soft -- Pharyngeal -- Pharyngeal- Regular Pharyngeal residue - valleculae;Reduced tongue base retraction Pharyngeal -- Pharyngeal- Multi-consistency -- Pharyngeal -- Pharyngeal- Pill -- Pharyngeal -- Pharyngeal Comment --  CHL IP CERVICAL ESOPHAGEAL PHASE 05/20/2016 Cervical Esophageal Phase WFL Pudding Teaspoon -- Pudding Cup -- Honey Teaspoon -- Honey Cup -- Nectar Teaspoon -- Nectar Cup -- Nectar Straw -- Thin Teaspoon -- Thin Cup -- Thin Straw -- Puree -- Mechanical Soft -- Regular -- Multi-consistency -- Pill -- Cervical Esophageal Comment -- CHL IP GO 05/14/2016 Functional Assessment Tool Used (None) Functional  Limitations Swallowing Swallow Current Status [id number]) CL Swallow Goal Status [id number]) CK Swallow Discharge Status [id number]) (None) Motor Speech Current Status [id number]:1826400) (None) Motor Speech Goal Status UK:060616) (None) Motor Speech Goal Status SA:931536) (None) Spoken Language Comprehension Current Status MZ:5018135) (None) Spoken Language Comprehension Goal Status YD:1972797) (None) Spoken Language Comprehension Discharge Status UF:4533880) (None) Spoken Language Expression Current Status FP:837989) (None) Spoken Language Expression Goal Status LT:9098795) (None) Spoken Language Expression Discharge Status NF:1565649) (None) Attention Current Status OM:1732502) (None) Attention Goal Status EY:7266000) (None) Attention Discharge Status PJ:4613913) (None) Memory Current Status YL:3545582) (None) Memory Goal Status CF:3682075) (None) Memory Discharge Status QC:115444) (None) Voice Current Status BV:6183357) (None) Voice Goal Status EW:8517110) (  None) Voice Discharge Status [phone]) (None) Other Speech-Language Pathology Functional Limitation [id number]) (None) Other Speech-Language Pathology Functional Limitation Goal Status XD:1448828) (None) Other Speech-Language Pathology Functional Limitation Discharge Status [phone]) (None) [place] Siren 05/20/2016, 2:42 PM Orbie Pyo Colvin Caroli.Ed Engineer, agricultural [phone]               Micro Results   Recent Results (from the past 240 hour(s))  Culture, blood (Routine X 2) w Reflex to ID Panel     Status: None   Collection Time: 05/18/16 12:30 PM  Result Value Ref Range Status   Specimen Description BLOOD LEFT ARM  Final   Special Requests IN PEDIATRIC BOTTLE 2 CC  Final   Culture NO GROWTH 5 DAYS  Final   Report Status 05/23/2016 FINAL  Final  Culture, blood (Routine X 2) w Reflex to ID Panel     Status: None   Collection Time: 05/18/16  3:26 PM  Result Value Ref Range Status   Specimen Description BLOOD LEFT ANTECUBITAL  Final   Special Requests BOTTLES DRAWN AEROBIC AND ANAEROBIC 5CC EACH  Final   Culture NO GROWTH 5 DAYS   Final   Report Status 05/23/2016 FINAL  Final       Today   Subjective:   [name] today has, poor appeitite,  generalized weakness.  No headache, No chest pain, No abdominal pain - No Nausea, No new weakness tingling or numbness, No Cough - SOB.  Objective:   Blood pressure (!) 119/54, pulse 71, temperature 98 F (36.7 C), temperature source Oral, resp. rate 18, height 5\' 10"  (1.778 m), weight 109.5 kg (241 lb 8 oz), SpO2 95 %.   Intake/Output Summary (Last 24 hours) at 05/25/16 1259 Last data filed at 05/25/16 1013  Gross per 24 hour  Intake              543 ml  Output             1050 ml  Net             -507 ml    Exam Awake Alert, Frail. Supple Neck,No JVD Symmetrical Chest wall movement, Good air movement bilaterally, CTAB RRR,No Gallops,Rubs or new Murmurs, No Parasternal Heave +ve B.Sounds, Abd Soft, No tenderness, No rebound - guarding or rigidity. No Cyanosis, Clubbing or edema, No new Rash or bruise    Data Review   CBC w Diff: Lab Results  Component Value Date   WBC 5.9 05/25/2016   HGB 9.7 (L) 05/25/2016   HCT 30.3 (L) 05/25/2016   HCT 42.2 10/15/2015   PLT 276 05/25/2016   PLT 219 10/15/2015   LYMPHOPCT 6 05/12/2016   MONOPCT 6 05/12/2016   EOSPCT 1 05/12/2016   BASOPCT 0 05/12/2016    CMP: Lab Results  Component Value Date   NA 132 (L) 05/22/2016   NA 139 10/15/2015   K 4.0 05/22/2016   CL 102 05/22/2016   CO2 23 05/22/2016   BUN 19 05/22/2016   BUN 20 10/15/2015   CREATININE 0.82 05/22/2016   PROT 5.7 (L) 05/19/2016   PROT 6.7 10/15/2015   ALBUMIN 1.5 (L) 05/19/2016   ALBUMIN 4.0 10/15/2015   BILITOT 0.8 05/19/2016   BILITOT 0.6 10/15/2015   ALKPHOS 175 (H) 05/19/2016   AST 55 (H) 05/19/2016   ALT 25 05/19/2016  .   Total Time in preparing paper work, data evaluation and todays exam - 42 minutes  Rishawn Walck M.D on 05/25/2016 at 12:59 PM  [facility]   Office  [phone]

## 2016-05-25 NOTE — Progress Notes (Signed)
RN spoke to nurse Stephenie Acres at [facility]. Patient/family aware of disposition. All belongings packed and awaiting for transport. VSS.    Ave Filter, RN

## 2016-05-25 NOTE — Discharge Instructions (Signed)
Follow with Primary MD [name], MD after discharge from SNF  Get CBC, CMP,  checked  by Primary MD next visit.    Activity: As tolerated with Full fall precautions use walker/cane & assistance as needed   Disposition SNF   Diet: Dysphagia 3 with nectar thick, with feeding assistance and aspiration precautions.  For Heart failure patients - Check your Weight same time everyday, if you gain over 2 pounds, or you develop in leg swelling, experience more shortness of breath or chest pain, call your Primary MD immediately. Follow Cardiac Low Salt Diet and 1.5 lit/day fluid restriction.   On your next visit with your primary care physician please Get Medicines reviewed and adjusted.   Please request your Prim.MD to go over all Hospital Tests and Procedure/Radiological results at the follow up, please get all Hospital records sent to your Prim MD by signing hospital release before you go home.   If you experience worsening of your admission symptoms, develop shortness of breath, life threatening emergency, suicidal or homicidal thoughts you must seek medical attention immediately by calling 911 or calling your MD immediately  if symptoms less severe.  You Must read complete instructions/literature along with all the possible adverse reactions/side effects for all the Medicines you take and that have been prescribed to you. Take any new Medicines after you have completely understood and accpet all the possible adverse reactions/side effects.   Do not drive, operating heavy machinery, perform activities at heights, swimming or participation in water activities or provide baby sitting services if your were admitted for syncope or siezures until you have seen by Primary MD or a Neurologist and advised to do so again.  Do not drive when taking Pain medications.    Do not take more than prescribed Pain, Sleep and Anxiety Medications  Special Instructions: If you have smoked or chewed  Tobacco  in the last 2 yrs please stop smoking, stop any regular Alcohol  and or any Recreational drug use.  Wear Seat belts while driving.   Please note  You were cared for by a hospitalist during your hospital stay. If you have any questions about your discharge medications or the care you received while you were in the hospital after you are discharged, you can call the unit and asked to speak with the hospitalist on call if the hospitalist that took care of you is not available. Once you are discharged, your primary care physician will handle any further medical issues. Please note that NO REFILLS for any discharge medications will be authorized once you are discharged, as it is imperative that you return to your primary care physician (or establish a relationship with a primary care physician if you do not have one) for your aftercare needs so that they can reassess your need for medications and monitor your lab values.

## 2016-05-25 NOTE — Progress Notes (Signed)
Spoke with sister, [name], via telephone. Her and Coralyn Mark (daughter) debating which SNF to send patient to. [name] would like him at [facility] because family is close and can check on him daily. Coralyn Mark and [place] (wife) would like him at [facility]. Both Coralyn Mark and [name] should be visiting the hospital in the next few hours.   NO CHARGE  Ihor Dow, FNP-C Palliative Medicine Team  Phone: [phone] Fax: [phone]

## 2016-05-25 NOTE — Clinical Social Work Placement (Signed)
   CLINICAL SOCIAL WORK PLACEMENT  NOTE  Date:  05/25/2016  Patient Details  Name: [name] MRN: [id number] Date of Birth: May 03, 1925  Clinical Social Work is seeking post-discharge placement for this patient at the [place] level of care (*CSW will initial, date and re-position this form in  chart as items are completed):  Yes   Patient/family provided with [facility] Work Department's list of facilities offering this level of care within the geographic area requested by the patient (or if unable, by the patient's family).  Yes   Patient/family informed of their freedom to choose among providers that offer the needed level of care, that participate in Medicare, Medicaid or managed care program needed by the patient, have an available bed and are willing to accept the patient.  Yes   Patient/family informed of [facility]'s ownership interest in [facility] and [facility], as well as of the fact that they are under no obligation to receive care at these facilities.  PASRR submitted to EDS on       PASRR number received on       Existing PASRR number confirmed on 05/15/16     FL2 transmitted to all facilities in geographic area requested by pt/family on 05/15/16     FL2 transmitted to all facilities within larger geographic area on       Patient informed that his/her managed care company has contracts with or will negotiate with certain facilities, including the following:        Yes   Patient/family informed of bed offers received.  Patient chooses bed at [facility]     Physician recommends and patient chooses bed at      Patient to be transferred to [facility] on 05/25/16.  Patient to be transferred to facility by [facility]     Patient family notified on 05/25/16 of transfer.  Name of family member notified:  Pt's daughter, Morey Hummingbird     PHYSICIAN       Additional Comment:     _______________________________________________ Darden Dates, LCSW 05/25/2016, 2:56 PM

## 2016-05-25 NOTE — Clinical Social Work Note (Signed)
RN Report Information [facility] via [facility] Room# 113 Report# [phone]  Pt is ready for discharge today to [facility] via [facility]. Pt's family is aware and agreeable to discharge plan. Facility has received discharge information and is ready to admit pt. RN will call report. CSW is signing off as no further identified.   Darden Dates, MSW, LCSW  Clinical Social Worker  [phone]

## 2016-05-25 NOTE — Care Management Note (Signed)
Case Management Note  Patient Details  Name: [name] MRN: [id number] Date of Birth: 1925/04/25  Subjective/Objective:                    Action/Plan: Pt discharging to [facility]. No further needs per CM.   Expected Discharge Date:   (unknown)               Expected Discharge Plan:  Skilled Nursing Facility  In-House Referral:  Clinical Social Work  Discharge planning Services  CM Consult  Post Acute Care Choice:    Choice offered to:     DME Arranged:    DME Agency:     HH Arranged:    [place] Agency:     Status of Service:  Completed, signed off  If discussed at [employer], dates discussed:    Additional Comments:  Pollie Friar, RN 05/25/2016, 4:36 PM

## 2016-05-26 ENCOUNTER — Telehealth: Payer: Self-pay | Admitting: Oncology

## 2016-05-26 NOTE — Telephone Encounter (Signed)
Voicemail full and unable to reach pt. Mailed letter to inform of 9/15 appt date/time per LOS

## 2016-06-18 ENCOUNTER — Ambulatory Visit: Payer: Medicare Other | Admitting: Oncology

## 2016-06-18 ENCOUNTER — Encounter: Payer: Self-pay | Admitting: Neurology

## 2016-06-18 ENCOUNTER — Ambulatory Visit (INDEPENDENT_AMBULATORY_CARE_PROVIDER_SITE_OTHER): Payer: Medicare Other | Admitting: Neurology

## 2016-06-18 ENCOUNTER — Telehealth: Payer: Self-pay | Admitting: *Deleted

## 2016-06-18 VITALS — BP 85/50 | HR 74 | Ht 70.0 in | Wt 240.0 lb

## 2016-06-18 DIAGNOSIS — C799 Secondary malignant neoplasm of unspecified site: Secondary | ICD-10-CM

## 2016-06-18 DIAGNOSIS — C801 Malignant (primary) neoplasm, unspecified: Secondary | ICD-10-CM

## 2016-06-18 DIAGNOSIS — I639 Cerebral infarction, unspecified: Secondary | ICD-10-CM | POA: Diagnosis not present

## 2016-06-18 DIAGNOSIS — G7000 Myasthenia gravis without (acute) exacerbation: Secondary | ICD-10-CM | POA: Diagnosis not present

## 2016-06-18 MED ORDER — MYCOPHENOLATE MOFETIL 500 MG PO TABS: 500.0000 mg | ORAL_TABLET | Freq: Two times a day (BID) | ORAL | Status: AC

## 2016-06-18 NOTE — Telephone Encounter (Signed)
"  Sherretta with [facility] calling to see if patient has an appointment today at 3:00 pm."  No, appointment is now 06-21-2016 at 1000.   "Was this changed by the family?"  No Provider on call today and appointment changed.

## 2016-06-18 NOTE — Patient Instructions (Signed)
   We will reduce the Cellcept dose to 500 mg twice a day.

## 2016-06-18 NOTE — Progress Notes (Signed)
Reason for visit: Myasthenia gravis  [name] is an 83 y.o. male  History of present illness:  [name] is an 82 year old right-handed white male with a history of myasthenia gravis. The patient had been doing relatively well with his underlying disease on Mestinon and CellCept. The patient unfortunately had a hospitalization around 05/12/2016 with onset of a stroke event. The patient was found to have multiple bihemispheric strokes involving the right cerebellum, left posterior cerebral artery distribution, small stroke in the right basal ganglia area, and a right parietal stroke. The patient was found to have a DVT in the right leg. He has had recurrence of his renal cell carcinoma, he is felt to have a hypercoagulable state associated with cancer. He is now at [facility] extended care facility, he has been confused, somnolent. He is still able to eat and he drinks some. He is unable to walk. He has had slurring of speech. He had a pneumonia around the time of the stroke. He comes back in for an evaluation. He is now on Lovenox.  Past Medical History:  Diagnosis Date  . Arthritis    back, hips, knees   . Benign prostatic hypertrophy   . Cancer [facility])    h/o renal carcinoma   . Chronic kidney disease    h/o UTI-2012  . Coronary artery disease    sees Dr. Shelva Majestic   . Diverticulitis   . GERD (gastroesophageal reflux disease)   . H/O epistaxis   . Heart disease   . Hypertension   . Myasthenia gravis [facility])    sees Dr. Jannifer Franklin  . Obesity    exogenous  . Osteoporosis   . Partial small bowel obstruction ([place]) 04/16/10   resolved with bowel rest  . Pulmonary embolus ([place])    2000  . Shortness of breath   . Stroke ([place])   . Urinary incontinence   . UTI (urinary tract infection)     Past Surgical History:  Procedure Laterality Date  . ANGIOPLASTY     had 2 stent replacements  . CARDIAC CATHETERIZATION     2008, stents   . CARDIAC CATHETERIZATION  2008  .  COLONOSCOPY  05/28/10   per Dr. Laurence Spates, diverticulosis, no repeats planned   . DOPPLER ECHOCARDIOGRAPHY    . EYE SURGERY     cataracts(bilateral)  removed, ?iol  . JOINT REPLACEMENT     2005-R, L knee replacement- 2000  . NEPHRECTOMY     left  . NM MYOVIEW LTD  02/2011   moderate inferior scar no ischemia  . SP KYPHOPLASTY     and vertebroplasty  . stented placed  1/08  . stress dipyridamole myocardial perfusion    . TONSILLECTOMY    . TOTAL KNEE ARTHROPLASTY     both Sees Dr. Novella Olive  . TOTAL KNEE REVISION  03/28/2012   Procedure: TOTAL KNEE REVISION;  Surgeon: Hessie Dibble, MD;  Location: [facility];  Service: Orthopedics;  Laterality: Right;    Family History  Problem Relation Age of Onset  . Colon cancer Mother     Social history:  reports that he quit smoking about 57 years ago. His smoking use included Cigarettes. He has never used smokeless tobacco. He reports that he does not drink alcohol or use drugs.    Allergies  Allergen Reactions  . Dicyclomine Other (See Comments)    REACTION: can worsen myasthenia gravis    Medications:  Prior to Admission medications   Medication  Sig Start Date End Date Taking? Authorizing Provider  enoxaparin (LOVENOX) 150 MG/ML injection Inject 0.73 mLs (110 mg total) into the skin every 12 (twelve) hours. 05/25/16  Yes Albertine Patricia, MD  famotidine (PEPCID) 20 MG tablet Take 1 tablet (20 mg total) by mouth daily. 05/25/16  Yes Albertine Patricia, MD  food thickener (THICK IT) POWD use with all liquids 05/25/16  Yes Silver Huguenin Elgergawy, MD  guaiFENesin-dextromethorphan (ROBITUSSIN DM) 100-10 MG/5ML syrup Take 10 mLs by mouth every 8 (eight) hours as needed for cough. 05/25/16  Yes Albertine Patricia, MD  levalbuterol Penne Lash) 0.63 MG/3ML nebulizer solution Take 3 mLs (0.63 mg total) by nebulization every 6 (six) hours as needed for wheezing or shortness of breath. 05/25/16  Yes Silver Huguenin Elgergawy, MD  metoprolol succinate (TOPROL-XL)  50 MG 24 hr tablet TAKE 1 TABLET ONCE DAILY. 10/24/15  Yes Troy Sine, MD  Multiple Vitamin (MULTIVITAMIN WITH MINERALS) TABS Take 1 tablet by mouth daily.   Yes Historical Provider, MD  mycophenolate (CELLCEPT) 500 MG tablet TAKE 1 TABLET EVERY MORNING AND 2 TABLETS AT BEDTIME. 04/16/16  Yes Kathrynn Ducking, MD  polyethylene glycol [facility] / GLYCOLAX) packet Take 17 g by mouth daily as needed for moderate constipation. 05/25/16  Yes Silver Huguenin Elgergawy, MD  pyridostigmine (MESTINON) 60 MG tablet TAKE  (1)  TABLET  FOUR TIMES DAILY. Patient taking differently: TAKE  (1)  TABLET TWO  TIMES DAILY. 03/09/16  Yes Kathrynn Ducking, MD  tamsulosin (FLOMAX) 0.4 MG CAPS capsule Take 1 capsule (0.4 mg total) by mouth daily. 05/06/16  Yes Shanker Kristeen Mans, MD    ROS:  Out of a complete 14 system review of symptoms, the patient complains only of the following symptoms, and all other reviewed systems are negative.  Cough, wheezing, snoring Difficulty swallowing Feeling hot, cold Joint pain, joint swelling, aching muscles Allergies, runny nose Memory loss, confusion, weakness, speech slurring, difficulty swallowing, dizziness Hallucinations  Blood pressure (!) 85/50, pulse 74, height 5\' 10"  (1.778 m), weight 240 lb (108.9 kg).  Physical Exam  General: The patient is alert and cooperative at the time of the examination.  Respiratory: Occasional bilateral rhonchi are noted.  Cardiovascular: Regular rate and rhythm.  Skin: 1+ edema of the left leg below the knee is noted, 2+ on the right.   Neurologic Exam  Mental status: The patient is somnolent, he can be alerted, he will answer some questions but at other times appears to be confused.   Cranial nerves: Facial symmetry is present. Speech is dysarthric, at times unintelligible. The patient does not blink to threat from either side.  Motor: The patient has symmetric strength in both arms, no drift is seen. The patient does not cooperate well  with motor testing of the legs.  Sensory examination: The patient seems to respond to pain stimulation on all 4 extremities.  Coordination: The patient could not cooperate for cerebellar testing.  Gait and station: The patient is wheelchair-bound, he could not be ambulated.  Reflexes: Deep tendon reflexes are symmetric.  MRI brain 05/17/16:   IMPRESSION: 1. Large acute left PCA infarct with petechial hemorrhage. 2. Small acute embolic infarcts in the cerebellum and right cerebral hemisphere. 3. Chronic ischemic changes as above.  * MRI scan images were reviewed online. I agree with the written report.     Assessment/Plan:  1. Myasthenia gravis  2. Renal cell carcinoma with recurrence  3. Hypercoagulable state, right leg DVT  4. Multiple bihemispheric  strokes  The patient has significant disability associated with the stroke events. The patient is in a comfort care situation at this time. We will reduce his CellCept taking 500 mg twice daily. He will follow-up in 6 months.  Jill Alexanders MD 06/18/2016 11:11 AM  [facility] [address] [place], [place] [address]  Phone [phone] Fax [phone]

## 2016-06-19 ENCOUNTER — Encounter (HOSPITAL_COMMUNITY): Payer: Self-pay

## 2016-06-19 ENCOUNTER — Emergency Department (HOSPITAL_COMMUNITY): Payer: Medicare Other

## 2016-06-19 ENCOUNTER — Inpatient Hospital Stay (HOSPITAL_COMMUNITY)
Admission: EM | Admit: 2016-06-19 | Discharge: 2016-07-04 | DRG: 871 | Disposition: E | Payer: Medicare Other | Attending: Internal Medicine | Admitting: Internal Medicine

## 2016-06-19 DIAGNOSIS — Y95 Nosocomial condition: Secondary | ICD-10-CM | POA: Diagnosis present

## 2016-06-19 DIAGNOSIS — K219 Gastro-esophageal reflux disease without esophagitis: Secondary | ICD-10-CM | POA: Diagnosis present

## 2016-06-19 DIAGNOSIS — R0600 Dyspnea, unspecified: Secondary | ICD-10-CM

## 2016-06-19 DIAGNOSIS — A419 Sepsis, unspecified organism: Principal | ICD-10-CM | POA: Diagnosis present

## 2016-06-19 DIAGNOSIS — N179 Acute kidney failure, unspecified: Secondary | ICD-10-CM | POA: Diagnosis present

## 2016-06-19 DIAGNOSIS — Z79899 Other long term (current) drug therapy: Secondary | ICD-10-CM | POA: Diagnosis not present

## 2016-06-19 DIAGNOSIS — Z955 Presence of coronary angioplasty implant and graft: Secondary | ICD-10-CM

## 2016-06-19 DIAGNOSIS — Z888 Allergy status to other drugs, medicaments and biological substances status: Secondary | ICD-10-CM

## 2016-06-19 DIAGNOSIS — Z87891 Personal history of nicotine dependence: Secondary | ICD-10-CM | POA: Diagnosis not present

## 2016-06-19 DIAGNOSIS — G7000 Myasthenia gravis without (acute) exacerbation: Secondary | ICD-10-CM | POA: Diagnosis present

## 2016-06-19 DIAGNOSIS — R6521 Severe sepsis with septic shock: Secondary | ICD-10-CM | POA: Diagnosis present

## 2016-06-19 DIAGNOSIS — Z66 Do not resuscitate: Secondary | ICD-10-CM | POA: Diagnosis present

## 2016-06-19 DIAGNOSIS — Z800 Family history of malignant neoplasm of digestive organs: Secondary | ICD-10-CM | POA: Diagnosis not present

## 2016-06-19 DIAGNOSIS — G9340 Encephalopathy, unspecified: Secondary | ICD-10-CM | POA: Diagnosis present

## 2016-06-19 DIAGNOSIS — Z7901 Long term (current) use of anticoagulants: Secondary | ICD-10-CM | POA: Diagnosis not present

## 2016-06-19 DIAGNOSIS — J690 Pneumonitis due to inhalation of food and vomit: Secondary | ICD-10-CM | POA: Diagnosis present

## 2016-06-19 DIAGNOSIS — N401 Enlarged prostate with lower urinary tract symptoms: Secondary | ICD-10-CM | POA: Diagnosis present

## 2016-06-19 DIAGNOSIS — Z6834 Body mass index (BMI) 34.0-34.9, adult: Secondary | ICD-10-CM | POA: Diagnosis not present

## 2016-06-19 DIAGNOSIS — Z905 Acquired absence of kidney: Secondary | ICD-10-CM

## 2016-06-19 DIAGNOSIS — Z96653 Presence of artificial knee joint, bilateral: Secondary | ICD-10-CM | POA: Diagnosis present

## 2016-06-19 DIAGNOSIS — R001 Bradycardia, unspecified: Secondary | ICD-10-CM | POA: Diagnosis present

## 2016-06-19 DIAGNOSIS — I2510 Atherosclerotic heart disease of native coronary artery without angina pectoris: Secondary | ICD-10-CM | POA: Diagnosis present

## 2016-06-19 DIAGNOSIS — Z85528 Personal history of other malignant neoplasm of kidney: Secondary | ICD-10-CM

## 2016-06-19 DIAGNOSIS — N39498 Other specified urinary incontinence: Secondary | ICD-10-CM | POA: Diagnosis present

## 2016-06-19 DIAGNOSIS — Z515 Encounter for palliative care: Secondary | ICD-10-CM | POA: Diagnosis present

## 2016-06-19 DIAGNOSIS — Z8673 Personal history of transient ischemic attack (TIA), and cerebral infarction without residual deficits: Secondary | ICD-10-CM

## 2016-06-19 DIAGNOSIS — M810 Age-related osteoporosis without current pathological fracture: Secondary | ICD-10-CM | POA: Diagnosis present

## 2016-06-19 DIAGNOSIS — I10 Essential (primary) hypertension: Secondary | ICD-10-CM | POA: Diagnosis present

## 2016-06-19 DIAGNOSIS — N400 Enlarged prostate without lower urinary tract symptoms: Secondary | ICD-10-CM | POA: Diagnosis present

## 2016-06-19 DIAGNOSIS — Z86711 Personal history of pulmonary embolism: Secondary | ICD-10-CM

## 2016-06-19 DIAGNOSIS — E669 Obesity, unspecified: Secondary | ICD-10-CM | POA: Diagnosis present

## 2016-06-19 DIAGNOSIS — J189 Pneumonia, unspecified organism: Secondary | ICD-10-CM | POA: Diagnosis present

## 2016-06-19 DIAGNOSIS — I6340 Cerebral infarction due to embolism of unspecified cerebral artery: Secondary | ICD-10-CM | POA: Diagnosis present

## 2016-06-19 LAB — CBC WITH DIFFERENTIAL/PLATELET
Basophils Absolute: 0 10*3/uL (ref 0.0–0.1)
Basophils Relative: 0 %
Eosinophils Absolute: 0 10*3/uL (ref 0.0–0.7)
Eosinophils Relative: 0 %
HCT: 26.2 % — ABNORMAL LOW (ref 39.0–52.0)
Hemoglobin: 8.5 g/dL — ABNORMAL LOW (ref 13.0–17.0)
Lymphocytes Relative: 10 %
Lymphs Abs: 2 10*3/uL (ref 0.7–4.0)
MCH: 30.8 pg (ref 26.0–34.0)
MCHC: 32.4 g/dL (ref 30.0–36.0)
MCV: 94.9 fL (ref 78.0–100.0)
Monocytes Absolute: 1.5 10*3/uL — ABNORMAL HIGH (ref 0.1–1.0)
Monocytes Relative: 8 %
Neutro Abs: 16.5 10*3/uL — ABNORMAL HIGH (ref 1.7–7.7)
Neutrophils Relative %: 82 %
Platelets: 370 10*3/uL (ref 150–400)
RBC: 2.76 MIL/uL — ABNORMAL LOW (ref 4.22–5.81)
RDW: 20 % — ABNORMAL HIGH (ref 11.5–15.5)
WBC: 20.1 10*3/uL — ABNORMAL HIGH (ref 4.0–10.5)

## 2016-06-19 LAB — COMPREHENSIVE METABOLIC PANEL
ALT: 42 U/L (ref 17–63)
AST: 80 U/L — ABNORMAL HIGH (ref 15–41)
Albumin: 1.3 g/dL — ABNORMAL LOW (ref 3.5–5.0)
Alkaline Phosphatase: 145 U/L — ABNORMAL HIGH (ref 38–126)
Anion gap: 16 — ABNORMAL HIGH (ref 5–15)
BUN: 44 mg/dL — ABNORMAL HIGH (ref 6–20)
CO2: 17 mmol/L — ABNORMAL LOW (ref 22–32)
Calcium: 8.1 mg/dL — ABNORMAL LOW (ref 8.9–10.3)
Chloride: 115 mmol/L — ABNORMAL HIGH (ref 101–111)
Creatinine, Ser: 2.69 mg/dL — ABNORMAL HIGH (ref 0.61–1.24)
GFR calc Af Amer: 23 mL/min — ABNORMAL LOW (ref >60)
GFR calc non Af Amer: 20 mL/min — ABNORMAL LOW (ref >60)
Glucose, Bld: 107 mg/dL — ABNORMAL HIGH (ref 65–99)
Potassium: 5.1 mmol/L (ref 3.5–5.1)
Sodium: 148 mmol/L — ABNORMAL HIGH (ref 135–145)
Total Bilirubin: 1 mg/dL (ref 0.3–1.2)
Total Protein: 4.6 g/dL — ABNORMAL LOW (ref 6.5–8.1)

## 2016-06-19 LAB — I-STAT CG4 LACTIC ACID, ED: Lactic Acid, Venous: 4.63 mmol/L (ref 0.5–1.9)

## 2016-06-19 MED ORDER — LORAZEPAM 2 MG/ML PO CONC: 2.0000 mg | ORAL | Status: DC | PRN

## 2016-06-19 MED ORDER — SODIUM CHLORIDE 0.9 % IV SOLN
500.0000 mg | Freq: Once | INTRAVENOUS | Status: DC
  Filled 2016-06-19: qty 500

## 2016-06-19 MED ORDER — MORPHINE SULFATE (PF) 2 MG/ML IV SOLN: 2.0000 mg | INTRAVENOUS | Status: DC | PRN

## 2016-06-19 MED ORDER — HALOPERIDOL LACTATE 5 MG/ML IJ SOLN: 1.0000 mg | Freq: Four times a day (QID) | INTRAMUSCULAR | Status: DC | PRN

## 2016-06-19 MED ORDER — ONDANSETRON HCL 4 MG/2ML IJ SOLN: 4.0000 mg | Freq: Four times a day (QID) | INTRAMUSCULAR | Status: DC | PRN

## 2016-06-19 MED ORDER — GLYCOPYRROLATE 0.2 MG/ML IJ SOLN
0.2000 mg | INTRAMUSCULAR | Status: DC | PRN
  Filled 2016-06-19: qty 1

## 2016-06-19 MED ORDER — NAPHAZOLINE-GLYCERIN 0.012-0.2 % OP SOLN
1.0000 [drp] | Freq: Four times a day (QID) | OPHTHALMIC | Status: DC | PRN
  Filled 2016-06-19: qty 15

## 2016-06-19 MED ORDER — PIPERACILLIN-TAZOBACTAM 3.375 G IVPB 30 MIN
3.3750 g | Freq: Once | INTRAVENOUS | Status: AC
  Administered 2016-06-19: 3.375 g via INTRAVENOUS
  Filled 2016-06-19: qty 50

## 2016-06-19 MED ORDER — MORPHINE SULFATE (CONCENTRATE) 10 MG/0.5ML PO SOLN
5.0000 mg | ORAL | Status: DC | PRN
  Administered 2016-06-19: 5 mg via SUBLINGUAL
  Filled 2016-06-19: qty 0.5

## 2016-06-19 MED ORDER — LORAZEPAM 2 MG/ML IJ SOLN: 1.0000 mg | INTRAMUSCULAR | Status: DC | PRN

## 2016-06-19 MED ORDER — SCOPOLAMINE 1 MG/3DAYS TD PT72
1.0000 | MEDICATED_PATCH | TRANSDERMAL | Status: DC
  Administered 2016-06-19: 1.5 mg via TRANSDERMAL
  Filled 2016-06-19: qty 1

## 2016-06-19 MED ORDER — SODIUM CHLORIDE 0.9 % IV BOLUS (SEPSIS)
1000.0000 mL | Freq: Once | INTRAVENOUS | Status: AC
  Administered 2016-06-19: 1000 mL via INTRAVENOUS

## 2016-06-19 MED ORDER — SODIUM CHLORIDE 0.9 % IV BOLUS (SEPSIS)
500.0000 mL | Freq: Once | INTRAVENOUS | Status: AC
  Administered 2016-06-19: 500 mL via INTRAVENOUS

## 2016-06-19 MED ORDER — ONDANSETRON HCL 4 MG PO TABS: 4.0000 mg | ORAL_TABLET | Freq: Four times a day (QID) | ORAL | Status: DC | PRN

## 2016-06-19 MED ORDER — SODIUM CHLORIDE 0.9 % IV BOLUS (SEPSIS)
1000.0000 mL | Freq: Once | INTRAVENOUS | Status: AC
Start: 2016-06-19 — End: 2016-06-19
  Administered 2016-06-19: 1000 mL via INTRAVENOUS

## 2016-06-19 MED ORDER — PIPERACILLIN-TAZOBACTAM IN DEX 2-0.25 GM/50ML IV SOLN
2.2500 g | Freq: Four times a day (QID) | INTRAVENOUS | Status: DC
  Filled 2016-06-19: qty 50

## 2016-06-19 MED ORDER — VANCOMYCIN HCL IN DEXTROSE 1-5 GM/200ML-% IV SOLN
1000.0000 mg | Freq: Once | INTRAVENOUS | Status: AC
Start: 2016-06-19 — End: 2016-06-19
  Administered 2016-06-19: 1000 mg via INTRAVENOUS
  Filled 2016-06-19: qty 200

## 2016-06-21 ENCOUNTER — Ambulatory Visit: Payer: Medicare Other | Admitting: Oncology

## 2016-06-22 NOTE — Discharge Summary (Addendum)
Physician Discharge Summary  [name] DOB: [birth year] DOA: 07/16/16  PCP: [name], MD  Admit date: 07-16-16 Discharge date: 06/22/2016  Time spent: 25 minutes  Recommendations for Outpatient Follow-up:   No follow up appointment needed since pt passed away in hospital.  Discharge Diagnoses:  Principal Problem:   Septic shock ([place]) Active Problems:   Essential hypertension   GERD   BPH (benign prostatic hyperplasia)   CAD (coronary artery disease)   HCAP (healthcare-associated pneumonia)   Palliative care encounter   Cerebrovascular accident (CVA) due to embolism of cerebral artery ([place])   DNR (do not resuscitate)   AKI (acute kidney injury) ([place])   Acute encephalopathy   Aspiration pneumonia ([place])   Discharge Condition: death  Diet recommendation: none  Filed Weights   2016-07-16 1142  Weight: 108.9 kg (240 lb)    History of present illness:   [name] is a 82 y.o. male with medical history significant of renal cell carcinoma (s/p of left nephrectomy), hypertension, GERD, stroke, urinary incontinence, pulmonary embolism on Lovenox, small bowel obstruction, myasthenia gravis, diverticulitis, CAD, BPH, who presents with altered mental status and cough.  Patient was mostly recently admitted to the hospital for acquired pneumonia on 05/12/2016 and discharged to [facility] 05/25/2016. Per family, pt's health condition has been progressively declining after discharged to SNF. He continues to have chest congestion and productive cough. Patient mental status has also been worsening. Per family, his communication has slowly declined, was not able to appropriately communicative and became confused yesterday. He was seen by neurology yesterday who indicated that patients condition was beyond treatment. Patient was brought via EMS today. He was noted to be hypotensive and is nonresponsive to verbal stimuli. No moving extremities.  ED Course: pt was found  to have hypotension with blood pressure 61/28, WBC 20.1, worsening renal function, pending urinalysis, lactic acid 4.63, temperature normal, bradycardia, tachypnea, chest x-ray showed left basilar obesity for possible pneumonia. Patient is admitted to [facility] bed for comfort care  Hospital Course:   Septic shock [facility]): pt was admitted due to septic shock. He has leukocytosis, elevated lactate and hypotension. Possibly due to aspiration pneumonia, though not completely clear about the etiology. Given multiple chronic comorbidities, the chance of survival is extremely low. I spent lengthy time to have discussed the goal of care with family (wife, Marcia Brash [phone], 2 daughters, Ivin Booty [phone]). The family is realistic, agreed with comfort care from now. Pt was admit to regular bed for comfort care. We stopped drawing labs, Abx and IVF. Started Lorazepam 2 MG prn q4h SL for agitation; morphine 20 MG/ML concentrated solution (ROXANOL ) at 5 mg prn SL q2h for pain or shortness of breath; naphazoline 0.1 % ophthalmic solution at prn q6h for eye irritation; scopolamine 1.5 MG patch (1.5 mg total) onto the skin every 3 (three). We consulted to palliterative care team.  Pt was noticed not breathing at approximately January 30, 2144. Pt passed away peacefully. He was pronounced dead at January 29, 2149. Death verified by Dr. Oletha Cruel. Family was at bedside. Emotional support was offered to family at bedside.    Other Active Problems:  -Stopped all home medications and comfort care only from now.   Essential hypertension   GERD   BPH (benign prostatic hyperplasia)   CAD (coronary artery disease)   HCAP (healthcare-associated pneumonia)   Palliative care encounter   Cerebrovascular accident (CVA) due to embolism of cerebral artery ([place])   DNR (do not resuscitate)   AKI (acute  kidney injury) ([place])   Acute encephalopathy   Aspiration pneumonia ([place])   Procedures:  none  Consultations: none  Discharge  Exam: Vitals:   07/18/2016 1516 18-Jul-2016 1615  BP: (!) 57/30 (!) 66/31  Pulse: (!) 56 (!) 59  Resp: 26 (!) 22  Temp:  97.4 F (36.3 C)    General: patient passed away in hospital.  Discharge Instructions: none since pt passed away in hosptial.  Discharge Medication List as of 06/20/2016  1:50 AM  No medicines needed since pt passed away in hospital     The results of significant diagnostics from this hospitalization (including imaging, microbiology, ancillary and laboratory) are listed below for reference.    Significant Diagnostic Studies: Dg Chest Port 1 View  Result Date: 2016/07/18 CLINICAL DATA:  Code sepsis; pt was not able to answer to questions at all; per RN's note: pt comes to [place] from [place] home with c/o altered l.o.c./weakness and possible sepsis. He arrives obtunded and hypotensive.; hx CAD, PE, cancer per chart; ex smoker EXAM: PORTABLE CHEST 1 VIEW COMPARISON:  05/12/2016 FINDINGS: Hazy opacity at the left lung base obscures hemidiaphragm. There is mild opacity at the right lung base. Remainder of the lungs is clear. Cardiac silhouette is normal in size. No mediastinal or hilar masses. No convincing adenopathy. Skeletal structures are demineralized but grossly intact. IMPRESSION: 1. Left lung base opacity. This may reflect pneumonia given the provided history. It could be due to a combination of pleural fluid and atelectasis. 2. Mild right lung base opacity is most likely atelectasis. 3. No evidence of pulmonary edema. Electronically Signed   By: Lajean Manes M.D.   On: Jul 18, 2016 12:35    Microbiology: Recent Results (from the past 240 hour(s))  Blood Culture (routine x 2)     Status: None (Preliminary result)   Collection Time: Jul 18, 2016 11:20 AM  Result Value Ref Range Status   Specimen Description BLOOD RIGHT ARM  10 ML IN [facility] BOTTLE  Final   Special Requests NONE  Final   Culture   Final    NO GROWTH 3 DAYS Performed at [facility]     Report Status PENDING  Incomplete  Blood Culture (routine x 2)     Status: None (Preliminary result)   Collection Time: 07-18-16 11:20 AM  Result Value Ref Range Status   Specimen Description BLOOD LEFT ARM  5 ML IN [facility] BOTTLE  Final   Special Requests NONE  Final   Culture   Final    NO GROWTH 3 DAYS Performed at [facility]    Report Status PENDING  Incomplete     Labs: Basic Metabolic Panel:  Recent Labs Lab 2016/07/18 1200  NA 148*  K 5.1  CL 115*  CO2 17*  GLUCOSE 107*  BUN 44*  CREATININE 2.69*  CALCIUM 8.1*   Liver Function Tests:  Recent Labs Lab 07-18-2016 1200  AST 80*  ALT 42  ALKPHOS 145*  BILITOT 1.0  PROT 4.6*  ALBUMIN 1.3*   No results for input(s): LIPASE, AMYLASE in the last 168 hours. No results for input(s): AMMONIA in the last 168 hours. CBC:  Recent Labs Lab Jul 18, 2016 1200  WBC 20.1*  NEUTROABS 16.5*  HGB 8.5*  HCT 26.2*  MCV 94.9  PLT 370   Cardiac Enzymes: No results for input(s): CKTOTAL, CKMB, CKMBINDEX, TROPONINI in the last 168 hours. BNP: BNP (last 3 results) No results for input(s): BNP in the last 8760 hours.  ProBNP (last  3 results) No results for input(s): PROBNP in the last 8760 hours.  CBG: No results for input(s): GLUCAP in the last 168 hours.    Signed:  Ivor Costa MD.  [facility] 06/22/2016, 8:05 PM

## 2016-06-24 LAB — CULTURE, BLOOD (ROUTINE X 2)
Culture: NO GROWTH
Culture: NO GROWTH

## 2016-07-04 NOTE — ED Notes (Signed)
He remains essentially obtunded and has many family members present. The hospitalist has just entered his room.

## 2016-07-04 NOTE — Progress Notes (Signed)
Pt's wife, daughters, siblings, grandchildren and other relatives were bedside when [facility] arrived. They readily spoke of what a wonderful man he was who gave into his 28's. The family shared their faith (Christianity) life. Pt's wife said they have been married 5 years and she told me how they lost a son who was murdered by his wife who also committed suicide. Although the family has experienced significant loss, they were supportive of one another and appropriately tearful. They wanted and appreciated prayer and hydration. Please page if support is needed prior to my next visit. Chaplain Ernest Haber, M.Div.   06/29/2016 1300  Clinical Encounter Type  Visited With Family

## 2016-07-04 NOTE — Consult Note (Signed)
Consultation Note Date: 06/20/2016   Patient Name: [name]  DOB: 09/10/1925  MRN: [id number]  Age / Sex: 82 y.o., male  PCP: [name], MD Referring Physician: No att. providers found  Reason for Consultation: Terminal Care  HPI/Patient Profile: 82 y.o. male  with past medical history of renal cell carcinoma (s/p of left nephrectomy), hypertension, GERD, stroke, urinary incontinence, pulmonary embolism on Lovenox, small bowel obstruction, myasthenia gravis, diverticulitis, CAD, BPH admitted on 2016-06-30.  Plan is for full comfort care and palliative consulted for family support and recommendations on end-of-life care management.   Clinical Assessment and Goals of Care: I met today with patient's family including his daughter, niece, granddaughter, and 2 sisters.  They have a good understanding of his current condition and understands that he is actively dying.  I spoke with family at length and noted to perform life review about patient, discussed symptomatic management of common symptoms at the end-of-life, and provided anticipatory guidance about the dying process. All questions answered to the best of my abilities and reviewed comfort care plan with patient's nurse.  SUMMARY OF RECOMMENDATIONS   - Patient is actively dying. Full Comfort Care.  Code Status/Advance Care Planning:  DNR   Symptom Management:   Appears comfortable and symptoms currently well controlled.  As he has IV access, will also add on additional medications for use as needed via IV route.  Pain/shortness of breath: Morphine as needed  Anxiety: Ativan as needed  Agitation: Haldol as needed  Excess secretions: Has scopolamine patch in place. Robinul as needed  Palliative Prophylaxis:   Aspiration and Frequent Pain Assessment  Additional Recommendations (Limitations, Scope, Preferences):  Full Comfort  Care  Psycho-social/Spiritual:   Desire for further Chaplaincy support: Already involved  Additional Recommendations: Grief/Bereavement Support  Prognosis:   Hours - Days  Discharge Planning: Anticipated Hospital Death      Primary Diagnoses: Present on Admission: . Septic shock ([place]) . BPH (benign prostatic hyperplasia) . CAD (coronary artery disease) . Cerebrovascular accident (CVA) due to embolism of cerebral artery ([place]) . DNR (do not resuscitate) . Essential hypertension . GERD . AKI (acute kidney injury) ([place]) . Acute encephalopathy . HCAP (healthcare-associated pneumonia) . Aspiration pneumonia ([place])   I have reviewed the medical record, interviewed the patient and family, and examined the patient. The following aspects are pertinent.  Past Medical History:  Diagnosis Date  . Arthritis    back, hips, knees   . Benign prostatic hypertrophy   . Cancer [facility])    h/o renal carcinoma   . Chronic kidney disease    h/o UTI-2012  . Coronary artery disease    sees Dr. Shelva Majestic   . Diverticulitis   . GERD (gastroesophageal reflux disease)   . H/O epistaxis   . Heart disease   . Hypertension   . Myasthenia gravis [facility])    sees Dr. Jannifer Franklin  . Obesity    exogenous  . Osteoporosis   . Partial small bowel obstruction ([place]) 04/16/10   resolved with  bowel rest  . Pulmonary embolus ([place])    2000  . Shortness of breath   . Stroke ([place])   . Urinary incontinence   . UTI (urinary tract infection)    Social History   Social History  . Marital status: Married    Spouse name: N/A  . Number of children: 3  . Years of education: HS   Occupational History  . Retired [place] Topics  . Smoking status: Former Smoker    Types: Cigarettes    Quit date: 03/22/1959  . Smokeless tobacco: Never Used     Comment: quit 1970s  . Alcohol use No  . Drug use: No  . Sexual activity: Not Currently   Other Topics Concern  . None    Social History Narrative   Patient is retired   Patient has a high school education       Patient drinks 1-2 cups of caffeine daily.   Patient is right handed.    Family History  Problem Relation Age of Onset  . Colon cancer Mother    Scheduled Meds: Continuous Infusions: PRN Meds:. Medications Prior to Admission:  Prior to Admission medications   Medication Sig Start Date End Date Taking? Authorizing Provider  enoxaparin (LOVENOX) 150 MG/ML injection Inject 0.73 mLs (110 mg total) into the skin every 12 (twelve) hours. 05/25/16  Yes Albertine Patricia, MD  famotidine (PEPCID) 20 MG tablet Take 1 tablet (20 mg total) by mouth daily. 05/25/16  Yes Silver Huguenin Elgergawy, MD  guaiFENesin-dextromethorphan (ROBITUSSIN DM) 100-10 MG/5ML syrup Take 10 mLs by mouth every 8 (eight) hours as needed for cough. 05/25/16  Yes Albertine Patricia, MD  levalbuterol Penne Lash) 0.63 MG/3ML nebulizer solution Take 3 mLs (0.63 mg total) by nebulization every 6 (six) hours as needed for wheezing or shortness of breath. 05/25/16  Yes Silver Huguenin Elgergawy, MD  metoprolol succinate (TOPROL-XL) 50 MG 24 hr tablet TAKE 1 TABLET ONCE DAILY. 10/24/15  Yes Troy Sine, MD  Multiple Vitamin (MULTIVITAMIN WITH MINERALS) TABS Take 1 tablet by mouth daily.   Yes Historical Provider, MD  mycophenolate (CELLCEPT) 500 MG tablet Take 1 tablet (500 mg total) by mouth 2 (two) times daily. 06/18/16  Yes Kathrynn Ducking, MD  pantoprazole sodium (PROTONIX) 40 mg/20 mL PACK Take 40 mg by mouth daily.   Yes Historical Provider, MD  polyethylene glycol (MIRALAX / GLYCOLAX) packet Take 17 g by mouth daily as needed for moderate constipation. 05/25/16  Yes Silver Huguenin Elgergawy, MD  pyridostigmine (MESTINON) 60 MG tablet TAKE  (1)  TABLET  FOUR TIMES DAILY. 03/09/16  Yes Kathrynn Ducking, MD  tamsulosin (FLOMAX) 0.4 MG CAPS capsule Take 1 capsule (0.4 mg total) by mouth daily. 05/06/16  Yes Shanker Kristeen Mans, MD  food thickener (THICK IT) POWD  use with all liquids 05/25/16   Albertine Patricia, MD   Allergies  Allergen Reactions  . Dicyclomine Other (See Comments)    REACTION: can worsen myasthenia gravis   Review of Systems  Unable to perform ROS: Mental status change    Physical Exam  General: Elderly male. Somnolent. Does not arouse to verbal or tactile stimulation. He is actively dying.  HEENT: No bruits, no goiter, no JVD Heart: Irregular. No murmur appreciated. Lungs: Poor air movement, some periods of apnea noted Abdomen: Soft, nontender, nondistended, positive bowel sounds.  Ext: No significant edema Skin: Warm and dry Neuro: Unable to assess.   Vital  Signs: BP (!) 66/31 (BP Location: Left Arm)   Pulse (!) 59   Temp 97.4 F (36.3 C) (Axillary)   Resp (!) 22   Ht '5\' 10"'$  (1.778 m)   Wt 108.9 kg (240 lb)   SpO2 97%   BMI 34.44 kg/m  Pain Assessment: PAINAD       SpO2: SpO2: 97 % O2 Device:SpO2: 97 % O2 Flow Rate: .   IO: Intake/output summary:  Intake/Output Summary (Last 24 hours) at 06/20/16 1145 Last data filed at 07/17/16 1417  Gross per 24 hour  Intake             4300 ml  Output                0 ml  Net             4300 ml    LBM: Last BM Date: July 17, 2016 Baseline Weight: Weight: 108.9 kg (240 lb) Most recent weight: Weight: 108.9 kg (240 lb)     Palliative Assessment/Data:   Flowsheet Rows   Flowsheet Row Most Recent Value  Intake Tab  Referral Department  Hospitalist  Unit at Time of Referral  ER  Palliative Care Primary Diagnosis  Neurology  Date Notified  2016/07/17  Palliative Care Type  New Palliative care  Reason for referral  End of Life Care Assistance  Date of Admission  07-17-16  Date first seen by Palliative Care  07/17/16  # of days Palliative referral response time  0 Day(s)  # of days IP prior to Palliative referral  0  Clinical Assessment  Palliative Performance Scale Score  10%  Pain Max last 24 hours  Not able to report  Pain Min Last 24 hours  Not able to  report  Psychosocial & Spiritual Assessment  Palliative Care Outcomes  Patient/Family meeting held?  No  Palliative Care Outcomes  Improved pain interventions, Improved non-pain symptom therapy      Time In: 1820 Time Out: 1905 Time Total: 45 Greater than 50%  of this time was spent counseling and coordinating care related to the above assessment and plan.  Signed by: Micheline Rough, MD   Please contact Palliative Medicine Team phone at [phone] for questions and concerns.  For individual provider: See Shea Evans

## 2016-07-04 NOTE — ED Notes (Signed)
We have drawn two blood cultures and other labs. Pt. Placed on non-rebreather shortly after arrival at 15 l.p.m. And remains on it.
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
## 2016-07-04 NOTE — Progress Notes (Signed)
Pharmacy Antibiotic Follow-up Note  [name] is a 82 y.o. year-old male admitted on 07/01/16.  The patient is currently on day 1 of Vancomycin & Zosyn for rule out sepsis.  Assessment/Plan: Vancomycin 1000mg  in ED, per nomogram would dose 1500mg  q48 Aiming for Vanc trough 15-20 mcg/ml Plan additional 500mg  Vancomycin today, further orders based on renal function Zosyn 2.25gm q6hr  Temp (24hrs), Avg:98.6 F (37 C), Min:98.6 F (37 C), Max:98.6 F (37 C)  No results for input(s): WBC in the last 168 hours.  Invalid input(s):  CREATININE No results for input(s): CREATININE in the last 168 hours. CrCl cannot be calculated (Patient's most recent lab result is older than the maximum 21 days allowed.).    Allergies  Allergen Reactions  . Dicyclomine Other (See Comments)    REACTION: can worsen myasthenia gravis   Antimicrobials this admission: 9/6 Vancomycin >>  9/16 Zosyn >>   Levels/dose changes this admission:  Microbiology results: 9/16 Blood Cx: sent 9/16 Urine Cx: requested  Thank you for allowing pharmacy to be a part of this patient's care.  Minda Ditto PharmD 07/01/2016 12:03 PM

## 2016-07-04 NOTE — ED Notes (Signed)
Dr. Blaine Hamper tells [place] he will admit pt. For comfort care (only) to med.-surg. Floor.  He spent much time with pt. And his family before he communicated this to [place].

## 2016-07-04 NOTE — ED Provider Notes (Signed)
[facility] DEPT Provider Note   CSN: [id number] Arrival date & time: 07/10/2016  1116     History   Chief Complaint Chief Complaint  Patient presents with  . Altered Mental Status    HPI [name] is a 82 y.o. male.  HPI   82 year old male presents today with altered mental status. Patient has a significant past medical history osteoarthritis, BPH, renal cell carcinoma, CK-MB, CAD, diverticulitis, GERD, hypertension, myasthenia gravis, obesity, osteoporosis. Patient was mostly recently admitted to the hospital for acquired pneumonia on 05/12/2016 and discharged on 05/25/2016.  Family notes that patient has had progressive decline, return of productive cough. They note that his communication has slowly declined, was not able to appropriately communicative yesterday. He was seen by neurology yesterday who indicated that patients condition was beyond treatment.  Patient was brought via EMS, he was noted to be hypotensive and is nonresponsive to verbal stimuli.  Family was at the emergency room consult at them, they report that patient has had progressive decline, and the patient's wishes were for no intubation, no " CPR paddles " for significant intervention. They report that they would not like CPR, intubation, central line or vasopressors. They would like the patient to be peaceful as they realize this is the end of his life.    Past Medical History:  Diagnosis Date  . Arthritis    back, hips, knees   . Benign prostatic hypertrophy   . Cancer [facility])    h/o renal carcinoma   . Chronic kidney disease    h/o UTI-2012  . Coronary artery disease    sees Dr. Shelva Majestic   . Diverticulitis   . GERD (gastroesophageal reflux disease)   . H/O epistaxis   . Heart disease   . Hypertension   . Myasthenia gravis [facility])    sees Dr. Jannifer Franklin  . Obesity    exogenous  . Osteoporosis   . Partial small bowel obstruction ([place]) 04/16/10   resolved with bowel rest  . Pulmonary embolus ([place])     2000  . Shortness of breath   . Stroke ([place])   . Urinary incontinence   . UTI (urinary tract infection)     Patient Active Problem List   Diagnosis Date Noted  . Septic shock ([place]) 07-10-2016  . AKI (acute kidney injury) ([place]) 2016/07/10  . Acute encephalopathy July 10, 2016  . Aspiration pneumonia ([place]) 2016/07/10  . DNR (do not resuscitate)   . Acute deep vein thrombosis (DVT) of right lower extremity ([place]) 05/18/2016  . Palliative care encounter   . DNR (do not resuscitate) discussion   . Goals of care, counseling/discussion   . Cerebrovascular accident (CVA) due to embolism of cerebral artery ([place])   . Acute thromboembolic cerebrovascular accident (CVA) ([place]) 05/17/2016  . Metastatic cancer ([place]) 05/17/2016  . Demand ischemia ([place]) 05/14/2016  . HCAP (healthcare-associated pneumonia) 05/13/2016  . 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 Portal vein thrombosis 05/13/2016  . Sinus tachycardia ([place]) 05/12/2016  . Abdominal pain 05/04/2016  . GI bleed 05/04/2016  . Abdominal mass, LUQ (left upper quadrant) 05/04/2016  . Normocytic anemia 05/04/2016  . CAD (coronary artery disease) 05/04/2016  . SIRS (systemic inflammatory response syndrome) ([place]) 05/04/2016  . Failure to thrive in adult 05/04/2016  . Urinary retention   . Pancreatic lesion 04/20/2016  . Recurrent UTI 04/20/2016  . First degree heart block 10/08/2015  . Environmental allergies 09/03/2013  . Hyperlipidemia with target LDL less than 70 07/25/2013  . Hx of  unilateral nephrectomy 07/25/2013  . 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 ABDOMINAL PAIN, ACUTE 04/16/2010  . ABDOMINAL PAIN, LOWER 04/16/2010  . COUGH 12/11/2009  . VIRAL GASTROENTERITIS 10/27/2009  . SKIN LESIONS, MULTIPLE 10/27/2009  . DEGENERATIVE JOINT DISEASE 10/27/2009  . RIB PAIN, RIGHT SIDED 08/13/2009  . TINEA CRURIS 06/11/2009  . UNSPECIFIED HEARING LOSS 06/11/2009  . LIPOMA 10/26/2007  . LEG CRAMPS 10/26/2007  . BPH (benign  prostatic hyperplasia) 07/18/2007  . Personal history of renal cell carcinoma 03/27/2007  . MYASTHENIA GRAVIS W/O (ACUTE) EXACERBATION 03/27/2007  . Essential hypertension 03/27/2007  . Coronary atherosclerosis 03/27/2007  . GERD 03/27/2007  . COLONIC POLYPS, BENIGN, HX OF 03/27/2007  . DIVERTICULITIS, HX OF 03/27/2007    Past Surgical History:  Procedure Laterality Date  . ANGIOPLASTY     had 2 stent replacements  . CARDIAC CATHETERIZATION     2008, stents   . CARDIAC CATHETERIZATION  2008  . COLONOSCOPY  05/28/10   per Dr. Laurence Spates, diverticulosis, no repeats planned   . DOPPLER ECHOCARDIOGRAPHY    . EYE SURGERY     cataracts(bilateral)  removed, ?iol  . JOINT REPLACEMENT     2005-R, L knee replacement- 2000  . NEPHRECTOMY     left  . NM MYOVIEW LTD  02/2011   moderate inferior scar no ischemia  . SP KYPHOPLASTY     and vertebroplasty  . stented placed  1/08  . stress dipyridamole myocardial perfusion    . TONSILLECTOMY    . TOTAL KNEE ARTHROPLASTY     both Sees Dr. Novella Olive  . TOTAL KNEE REVISION  03/28/2012   Procedure: TOTAL KNEE REVISION;  Surgeon: Hessie Dibble, MD;  Location: [place];  Service: Orthopedics;  Laterality: Right;      Home Medications    Prior to Admission medications   Medication Sig Start Date End Date Taking? Authorizing Provider  enoxaparin (LOVENOX) 150 MG/ML injection Inject 0.73 mLs (110 mg total) into the skin every 12 (twelve) hours. 05/25/16  Yes Albertine Patricia, MD  famotidine (PEPCID) 20 MG tablet Take 1 tablet (20 mg total) by mouth daily. 05/25/16  Yes Silver Huguenin Elgergawy, MD  guaiFENesin-dextromethorphan (ROBITUSSIN DM) 100-10 MG/5ML syrup Take 10 mLs by mouth every 8 (eight) hours as needed for cough. 05/25/16  Yes Albertine Patricia, MD  levalbuterol Penne Lash) 0.63 MG/3ML nebulizer solution Take 3 mLs (0.63 mg total) by nebulization every 6 (six) hours as needed for wheezing or shortness of breath. 05/25/16  Yes Silver Huguenin  Elgergawy, MD  metoprolol succinate (TOPROL-XL) 50 MG 24 hr tablet TAKE 1 TABLET ONCE DAILY. 10/24/15  Yes Troy Sine, MD  Multiple Vitamin (MULTIVITAMIN WITH MINERALS) TABS Take 1 tablet by mouth daily.   Yes Historical Provider, MD  mycophenolate (CELLCEPT) 500 MG tablet Take 1 tablet (500 mg total) by mouth 2 (two) times daily. 06/18/16  Yes Kathrynn Ducking, MD  pantoprazole sodium (PROTONIX) 40 mg/20 mL PACK Take 40 mg by mouth daily.   Yes Historical Provider, MD  polyethylene glycol (MIRALAX / GLYCOLAX) packet Take 17 g by mouth daily as needed for moderate constipation. 05/25/16  Yes Silver Huguenin Elgergawy, MD  pyridostigmine (MESTINON) 60 MG tablet TAKE  (1)  TABLET  FOUR TIMES DAILY. 03/09/16  Yes Kathrynn Ducking, MD  tamsulosin (FLOMAX) 0.4 MG CAPS capsule Take 1 capsule (0.4 mg total) by mouth daily. 05/06/16  Yes Shanker Kristeen Mans, MD  food thickener (THICK IT) POWD use with  all liquids 05/25/16   Albertine Patricia, MD    Family History Family History  Problem Relation Age of Onset  . Colon cancer Mother     Social History Social History  Substance Use Topics  . Smoking status: Former Smoker    Types: Cigarettes    Quit date: 03/22/1959  . Smokeless tobacco: Never Used     Comment: quit 1970s  . Alcohol use No     Allergies   Dicyclomine   Review of Systems Review of Systems  All other systems reviewed and are negative.   Physical Exam Updated Vital Signs BP (!) 79/39 (BP Location: Left Arm)   Pulse (!) 57   Temp 98.6 F (37 C) (Rectal)   Resp 22   Ht 5\' 10"  (1.778 m)   Wt 108.9 kg   SpO2 100%   BMI 34.44 kg/m   Physical Exam  HENT:  Head: Normocephalic and atraumatic.  Cardiovascular: Normal rate.   Pulmonary/Chest:  Labored respirations   Abdominal: Soft.  Neurological:  Responds to painful stimuli   Skin:  Warm to touch   Nursing note and vitals reviewed.    ED Treatments / Results  Labs (all labs ordered are listed, but only abnormal  results are displayed) Labs Reviewed  COMPREHENSIVE METABOLIC PANEL - Abnormal; Notable for the following:       Result Value   Sodium 148 (*)    Chloride 115 (*)    CO2 17 (*)    Glucose, Bld 107 (*)    BUN 44 (*)    Creatinine, Ser 2.69 (*)    Calcium 8.1 (*)    Total Protein 4.6 (*)    Albumin 1.3 (*)    AST 80 (*)    Alkaline Phosphatase 145 (*)    GFR calc non Af Amer 20 (*)    GFR calc Af Amer 23 (*)    Anion gap 16 (*)    All other components within normal limits  CBC WITH DIFFERENTIAL/PLATELET - Abnormal; Notable for the following:    WBC 20.1 (*)    RBC 2.76 (*)    Hemoglobin 8.5 (*)    HCT 26.2 (*)    RDW 20.0 (*)    Neutro Abs 16.5 (*)    Monocytes Absolute 1.5 (*)    All other components within normal limits  I-STAT CG4 LACTIC ACID, ED - Abnormal; Notable for the following:    Lactic Acid, Venous 4.63 (*)    All other components within normal limits  CULTURE, BLOOD (ROUTINE X 2)  CULTURE, BLOOD (ROUTINE X 2)    EKG  EKG Interpretation  Date/Time:  26-Jun-2016 11:38:55 EDT Ventricular Rate:  72 PR Interval:    QRS Duration: 118 QT Interval:  473 QTC Calculation: 518 R Axis:   -7 Text Interpretation:  AV block, complete (third degree) Nonspecific intraventricular conduction delay Minimal ST elevation, inferior leads Significant artifact. No clear STEMI. Will repeat.  Confirmed by LONG MD, JOSHUA [phone]) on 06-26-16 1:46:09 PM       Radiology Dg Chest Port 1 View  Result Date: 06-26-2016 CLINICAL DATA:  Code sepsis; pt was not able to answer to questions at all; per RN's note: pt comes to [place] from [facility] with c/o altered l.o.c./weakness and possible sepsis. He arrives obtunded and hypotensive.; hx CAD, PE, cancer per chart; ex smoker EXAM: PORTABLE CHEST 1 VIEW COMPARISON:  05/12/2016 FINDINGS: Hazy opacity at the left lung base obscures hemidiaphragm.  There is mild opacity at the right lung base. Remainder of the lungs  is clear. Cardiac silhouette is normal in size. No mediastinal or hilar masses. No convincing adenopathy. Skeletal structures are demineralized but grossly intact. IMPRESSION: 1. Left lung base opacity. This may reflect pneumonia given the provided history. It could be due to a combination of pleural fluid and atelectasis. 2. Mild right lung base opacity is most likely atelectasis. 3. No evidence of pulmonary edema. Electronically Signed   By: Lajean Manes M.D.   On: 2016-07-02 12:35    Procedures Procedures (including critical care time)  CRITICAL CARE Performed by: Elmer Ramp   Total critical care time: 35 minutes  Critical care time was exclusive of separately billable procedures and treating other patients.  Critical care was necessary to treat or prevent imminent or life-threatening deterioration.  Critical care was time spent personally by me on the following activities: development of treatment plan with patient and/or surrogate as well as nursing, discussions with consultants, evaluation of patient's response to treatment, examination of patient, obtaining history from patient or surrogate, ordering and performing treatments and interventions, ordering and review of laboratory studies, ordering and review of radiographic studies, pulse oximetry and re-evaluation of patient's condition.  Medications Ordered in ED Medications  LORazepam (ATIVAN) 2 MG/ML concentrated solution 2 mg (not administered)  morphine CONCENTRATE 10 MG/0.5ML oral solution 5 mg (not administered)  naphazoline-glycerin (CLEAR EYES) ophth solution 1-2 drop (not administered)  scopolamine (TRANSDERM-SCOP) 1 MG/3DAYS 1.5 mg (not administered)  sodium chloride 0.9 % bolus 1,000 mL (0 mLs Intravenous Stopped 2016-07-02 1351)    And  sodium chloride 0.9 % bolus 1,000 mL (0 mLs Intravenous Stopped 07-02-16 1351)    And  sodium chloride 0.9 % bolus 1,000 mL (0 mLs Intravenous Stopped 2016/07/02 1351)    And    sodium chloride 0.9 % bolus 500 mL (0 mLs Intravenous Stopped 2016-07-02 1417)  piperacillin-tazobactam (ZOSYN) IVPB 3.375 g (0 g Intravenous Stopped Jul 02, 2016 1352)  vancomycin (VANCOCIN) IVPB 1000 mg/200 mL premix (0 mg Intravenous Stopped 07/02/2016 1352)     Initial Impression / Assessment and Plan / ED Course  I have reviewed the triage vital signs and the nursing notes.  Pertinent labs & imaging results that were available during my care of the patient were reviewed by me and considered in my medical decision making (see chart for details).  Clinical Course     Final Clinical Impressions(s) / ED Diagnoses   Final diagnoses:  Sepsis, due to unspecified organism ([place])   Labs: Lactic 4.63, WBC 20.1  Imaging: chest port  Consults:  Therapeutics:  Discharge Meds:   Assessment/Plan:  81 year old male presents today with severe sepsis and altered mental status. Patient's presentation likely secondary to pneumonia. Patient has had a progressive decline, and is nonverbal at this point. In-depth discussion with family including wife who is power of attorney, she reports the patient would not want any significant interventions including intubation, CPR, central line, vasopressors. She reports that they would like antibiotics and fluids and comfort care. Palliative care consult he would see patient in the hospital, [facility] consult. Admit the patient.    New Prescriptions New Prescriptions   No medications on file     Okey Regal, PA-C 07-02-2016 [place], MD 07-02-16 1958

## 2016-07-04 NOTE — ED Triage Notes (Signed)
He comes to [place] from [place] home with c/o altered l.o.c./weakness and possible sepsis. He arrives obtunded and hypotensive.

## 2016-07-04 NOTE — ED Notes (Signed)
Bed: RESB Expected date: 07/08/2016 Expected time: 11:15 AM Means of arrival: Ambulance Comments: Sepsis

## 2016-07-04 NOTE — Progress Notes (Addendum)
Went to check on patient at approximately 2144-01-19 and noticed he was not breathing. He was pronounced dead at 01-18-49. Death verified by Oletha Cruel. Family was at bedside.

## 2016-07-04 NOTE — Progress Notes (Signed)
Met with family this evening.  Patient is actively dying.  Appears comfortable on my exam.  Provided anticipatory counseling.  Agree with current comfort regimen.  Made addition of IV medications for use as well if needed as patient already has IV access.  Prognosis is hours to days.  D/w chaplain, bedside RN, patient family.  Full consult note to follow.  Micheline Rough, MD [place] Team [phone]

## 2016-07-04 NOTE — H&P (Addendum)
History and Physical    [name] [id number] DOB: 1925/09/26 DOA: 07-15-2016  Referring MD/NP/PA:   PCP: [name], MD   Patient coming from:  [facility]  At baseline, pt is dependent for his ADL.   Chief Complaint: Altered mental status and cough  HPI: [name] is a 82 y.o. male with medical history significant of renal cell carcinoma (s/p of left nephrectomy), hypertension, GERD, stroke, urinary incontinence, pulmonary embolism on Lovenox, small bowel obstruction, myasthenia gravis, diverticulitis, CAD, BPH, who presents with altered mental status and cough.  Patient was mostly recently admitted to the hospital for acquired pneumonia on 05/12/2016 and [facility]. Per family, pt's health condition has been progressively declining after discharged to SNF. He continues to have chest congestion and productive cough. Patient mental status has also been worsening. Per family, his communication has slowly declined, was not able to appropriately communicative and became confused yesterday. He was seen by neurology yesterday who indicated that patients condition was beyond treatment.  Patient was brought via EMS today. He was noted to be hypotensive and is nonresponsive to verbal stimuli. No moving extremities.  ED Course: pt was found to have hypotension with blood pressure 61/28, WBC 20.1, worsening renal function, pending urinalysis, lactic acid 4.63, temperature normal, bradycardia, tachypnea, chest x-ray showed left basilar obesity for possible pneumonia. Patient is admitted to [place] bed for comfort care  Review of Systems: Could not be reviewed due to altered mental status and nonresponsive status.  Allergy:  Allergies  Allergen Reactions  . Dicyclomine Other (See Comments)    REACTION: can worsen myasthenia gravis    Past Medical History:  Diagnosis Date  . Arthritis    back, hips, knees   . Benign prostatic hypertrophy    . Cancer [facility])    h/o renal carcinoma   . Chronic kidney disease    h/o UTI-2012  . Coronary artery disease    sees Dr. Shelva Majestic   . Diverticulitis   . GERD (gastroesophageal reflux disease)   . H/O epistaxis   . Heart disease   . Hypertension   . Myasthenia gravis [facility])    sees Dr. Jannifer Franklin  . Obesity    exogenous  . Osteoporosis   . Partial small bowel obstruction ([place]) 04/16/10   resolved with bowel rest  . Pulmonary embolus ([place])    2000  . Shortness of breath   . Stroke ([place])   . Urinary incontinence   . UTI (urinary tract infection)     Past Surgical History:  Procedure Laterality Date  . ANGIOPLASTY     had 2 stent replacements  . CARDIAC CATHETERIZATION     2008, stents   . CARDIAC CATHETERIZATION  2008  . COLONOSCOPY  05/28/10   per Dr. Laurence Spates, diverticulosis, no repeats planned   . DOPPLER ECHOCARDIOGRAPHY    . EYE SURGERY     cataracts(bilateral)  removed, ?iol  . JOINT REPLACEMENT     2005-R, L knee replacement- 2000  . NEPHRECTOMY     left  . NM MYOVIEW LTD  02/2011   moderate inferior scar no ischemia  . SP KYPHOPLASTY     and vertebroplasty  . stented placed  1/08  . stress dipyridamole myocardial perfusion    . TONSILLECTOMY    . TOTAL KNEE ARTHROPLASTY     both Sees Dr. Novella Olive  . TOTAL KNEE REVISION  03/28/2012   Procedure: TOTAL KNEE REVISION;  Surgeon:  Hessie Dibble, MD;  Location: [place];  Service: Orthopedics;  Laterality: Right;    Social History:  reports that he quit smoking about 57 years ago. His smoking use included Cigarettes. He has never used smokeless tobacco. He reports that he does not drink alcohol or use drugs.  Family History:  Family History  Problem Relation Age of Onset  . Colon cancer Mother      Prior to Admission medications   Medication Sig Start Date End Date Taking? Authorizing Provider  enoxaparin (LOVENOX) 150 MG/ML injection Inject 0.73 mLs (110 mg total) into the skin every 12 (twelve) hours.  05/25/16  Yes Albertine Patricia, MD  famotidine (PEPCID) 20 MG tablet Take 1 tablet (20 mg total) by mouth daily. 05/25/16  Yes Silver Huguenin Elgergawy, MD  guaiFENesin-dextromethorphan (ROBITUSSIN DM) 100-10 MG/5ML syrup Take 10 mLs by mouth every 8 (eight) hours as needed for cough. 05/25/16  Yes Albertine Patricia, MD  levalbuterol Penne Lash) 0.63 MG/3ML nebulizer solution Take 3 mLs (0.63 mg total) by nebulization every 6 (six) hours as needed for wheezing or shortness of breath. 05/25/16  Yes Silver Huguenin Elgergawy, MD  metoprolol succinate (TOPROL-XL) 50 MG 24 hr tablet TAKE 1 TABLET ONCE DAILY. 10/24/15  Yes Troy Sine, MD  Multiple Vitamin (MULTIVITAMIN WITH MINERALS) TABS Take 1 tablet by mouth daily.   Yes Historical Provider, MD  mycophenolate (CELLCEPT) 500 MG tablet Take 1 tablet (500 mg total) by mouth 2 (two) times daily. 06/18/16  Yes Kathrynn Ducking, MD  pantoprazole sodium (PROTONIX) 40 mg/20 mL PACK Take 40 mg by mouth daily.   Yes Historical Provider, MD  polyethylene glycol (MIRALAX / GLYCOLAX) packet Take 17 g by mouth daily as needed for moderate constipation. 05/25/16  Yes Silver Huguenin Elgergawy, MD  pyridostigmine (MESTINON) 60 MG tablet TAKE  (1)  TABLET  FOUR TIMES DAILY. 03/09/16  Yes Kathrynn Ducking, MD  tamsulosin (FLOMAX) 0.4 MG CAPS capsule Take 1 capsule (0.4 mg total) by mouth daily. 05/06/16  Yes Shanker Kristeen Mans, MD  food thickener (THICK IT) POWD use with all liquids 05/25/16   Albertine Patricia, MD    Physical Exam: Vitals:   07/12/16 1346 07-12-2016 1415 07-12-2016 1516 07/12/2016 1615  BP: (!) 79/39 (!) 79/39 (!) 57/30 (!) 66/31  Pulse: (!) 57 (!) 57 (!) 56 (!) 59  Resp: 23 22 26  (!) 22  Temp:    97.4 F (36.3 C)  TempSrc:    Axillary  SpO2: 100% 100% 100% 97%  Weight:      Height:       General: In nonresponsive state HEENT:       Eyes: no scleral icterus.       ENT: No discharge from the ears and nose, no pharynx injection       Neck: No JVD, no bruit, no mass  felt. Heme: No neck lymph node enlargement. Cardiac: S1/S2, RRR, No murmurs, No gallops or rubs. Respiratory: Has diffused rhonchi bilaterally. GI: Soft, nondistended, no organomegaly, BS present. GU: No hematuria Ext: No pitting leg edema bilaterally. 2+DP/PT pulse bilaterally. Musculoskeletal: No joint deformities, No joint redness or warmth, no limitation of ROM in spin. Skin: No rashes.  Neuro: Nonresponsive, not moving extremities.  Psych: Could not be reviewed due to nonresponsive state  Labs on Admission: I have personally reviewed following labs and imaging studies  CBC:  Recent Labs Lab 07-12-2016 1200  WBC 20.1*  NEUTROABS 16.5*  HGB 8.5*  HCT  26.2*  MCV 94.9  PLT 0000000   Basic Metabolic Panel:  Recent Labs Lab 07-17-2016 1200  NA 148*  K 5.1  CL 115*  CO2 17*  GLUCOSE 107*  BUN 44*  CREATININE 2.69*  CALCIUM 8.1*   GFR: Estimated Creatinine Clearance: 23.5 mL/min (by C-G formula based on SCr of 2.69 mg/dL (H)). Liver Function Tests:  Recent Labs Lab Jul 17, 2016 1200  AST 80*  ALT 42  ALKPHOS 145*  BILITOT 1.0  PROT 4.6*  ALBUMIN 1.3*   No results for input(s): LIPASE, AMYLASE in the last 168 hours. No results for input(s): AMMONIA in the last 168 hours. Coagulation Profile: No results for input(s): INR, PROTIME in the last 168 hours. Cardiac Enzymes: No results for input(s): CKTOTAL, CKMB, CKMBINDEX, TROPONINI in the last 168 hours. BNP (last 3 results) No results for input(s): PROBNP in the last 8760 hours. HbA1C: No results for input(s): HGBA1C in the last 72 hours. CBG: No results for input(s): GLUCAP in the last 168 hours. Lipid Profile: No results for input(s): CHOL, HDL, LDLCALC, TRIG, CHOLHDL, LDLDIRECT in the last 72 hours. Thyroid Function Tests: No results for input(s): TSH, T4TOTAL, FREET4, T3FREE, THYROIDAB in the last 72 hours. Anemia Panel: No results for input(s): VITAMINB12, FOLATE, FERRITIN, TIBC, IRON, RETICCTPCT in the last  72 hours. Urine analysis:    Component Value Date/Time   COLORURINE AMBER (A) 05/12/2016 1720   APPEARANCEUR CLEAR 05/12/2016 1720   LABSPEC >1.046 (H) 05/12/2016 1720   PHURINE 5.5 05/12/2016 1720   GLUCOSEU NEGATIVE 05/12/2016 1720   HGBUR MODERATE (A) 05/12/2016 1720   HGBUR negative 08/13/2009 1549   BILIRUBINUR NEGATIVE 05/12/2016 1720   KETONESUR NEGATIVE 05/12/2016 1720   PROTEINUR NEGATIVE 05/12/2016 1720   UROBILINOGEN 0.2 01/28/2016 1510   NITRITE NEGATIVE 05/12/2016 1720   LEUKOCYTESUR NEGATIVE 05/12/2016 1720   Sepsis Labs: @LABRCNTIP (procalcitonin:4,lacticidven:4) )No results found for this or any previous visit (from the past 240 hour(s)).   Radiological Exams on Admission: Dg Chest Port 1 View  Result Date: 2016-07-17 CLINICAL DATA:  Code sepsis; pt was not able to answer to questions at all; per RN's note: pt comes to [place] from [place] home with c/o altered l.o.c./weakness and possible sepsis. He arrives obtunded and hypotensive.; hx CAD, PE, cancer per chart; ex smoker EXAM: PORTABLE CHEST 1 VIEW COMPARISON:  05/12/2016 FINDINGS: Hazy opacity at the left lung base obscures hemidiaphragm. There is mild opacity at the right lung base. Remainder of the lungs is clear. Cardiac silhouette is normal in size. No mediastinal or hilar masses. No convincing adenopathy. Skeletal structures are demineralized but grossly intact. IMPRESSION: 1. Left lung base opacity. This may reflect pneumonia given the provided history. It could be due to a combination of pleural fluid and atelectasis. 2. Mild right lung base opacity is most likely atelectasis. 3. No evidence of pulmonary edema. Electronically Signed   By: Lajean Manes M.D.   On: July 17, 2016 12:35     EKG: Independently reviewed. Sinus rhythm, QTC 518, early how we've progression, nonspecific T-wave change.   Assessment/Plan Principal Problem:   Septic shock (HCC) Active Problems:   Essential hypertension   GERD    BPH (benign prostatic hyperplasia)   CAD (coronary artery disease)   HCAP (healthcare-associated pneumonia)   Palliative care encounter   Cerebrovascular accident (CVA) due to embolism of cerebral artery ([place])   DNR (do not resuscitate)   AKI (acute kidney injury) ([place])   Acute encephalopathy   Aspiration pneumonia ([place])  Septic shock [facility]): Patient has septic shock clinically. He has leukocytosis, elevated lactate and hypotension. Possibly due to aspiration pneumonia, though not completely clear about the etiology. Given multiple chronic comorbidities, the chance of survival is extremely low. I spent lengthy time to have discussed the goal of care with family (wife, Marcia Brash [phone], 2 daughters, Ivin Booty [phone]). The family is realistic, agreed with comfort care from now.  -Will admit to regular bed for comfort care. -Stop drawing labs and IVF - stop Abx -LORazepam 2 MG prn q4h SL for agitation -morphine 20 MG/ML concentrated solution (ROXANOL ): 5 mg prn SL q2h for pain or shortness of breath.  -Naphazoline 0.1 % ophthalmic solution:  Prn q6h for eye irritation -scopolamine 1.5 MG patch (1.5 mg total) onto the skin every 3 (three)  -Psycho/Social: emotional support offered to family at bedside -consult to palliterative care team  Active Problems:  -Stopped all home medications and comfort care only from now.   Essential hypertension   GERD   BPH (benign prostatic hyperplasia)   CAD (coronary artery disease)   HCAP (healthcare-associated pneumonia)   Palliative care encounter   Cerebrovascular accident (CVA) due to embolism of cerebral artery ([place])   DNR (do not resuscitate)   AKI (acute kidney injury) ([place])   Acute encephalopathy   Aspiration pneumonia ([place])   DVT ppx: None  Code Status: DNR Family Communication: Yes, patient's wife, 2 daughters, grandson at bed side Disposition Plan:  Anticipate death in 01/26/2023 or 2 days Consults called:  Palliative care Admission  status: Inpatient/Med-surg bed   Date of Service 07/12/2016    Ivor Costa [facility] Pager [phone]  If 7PM-7AM, please contact night-coverage [email or web address] Password [facility] Jul 12, 2016, 6:36 PM

## 2016-07-04 NOTE — ED Notes (Signed)
Our providers have been attempting to reach pt's. Family. We have bi-pap on stand-by.

## 2016-07-04 NOTE — Progress Notes (Signed)
[place] stopped by to ck on pt's family. Pt's sisters and niece were outside of pt room working on a schedule to stay w/pt overnight. Pt's sisters immediately talked about pt's wife and daughters and spoke of their lack of care. Their discussion was indicative of the discord and dysfunction that is further complicated by the impending passing of the one that clearly all of them love. CH encouraged spt of each other and commended their love and care for pt. Pt's daughter and granddaughter arrived during my visit. Since leaving earlier, pt's granddaughter had to put her dog down which is adding another layer to her grief. Pt's daughter was ok and thankful for addtl visit. Spoke briefly w/physician to explain family divisivness. Please page if additional support is needed. Chaplain Ernest Haber, M.Div.   Jul 17, 2016 1800  Clinical Encounter Type  Visited With Family

## 2016-07-04 DEATH — deceased

## 2016-12-20 ENCOUNTER — Ambulatory Visit: Payer: Medicare Other | Admitting: Neurology

## 2017-09-08 IMAGING — CT CT ABD-PELV W/O CM
2 of 4 series · 15 of 46 positions shown, 17 images · non-contrast
Comparison: CT abdomen 03/03/2016

CLINICAL DATA: Constipation for 1 day, difficulty urinating for 1
day, abdominal pain, history GERD, BPH, GERD, small-bowel
obstruction, hypertension, pulmonary embolism, myasthenia gravis,
renal cell carcinoma, coronary artery disease, former smoker

EXAM:
CT ABDOMEN AND PELVIS WITHOUT CONTRAST
TECHNIQUE: Multidetector CT imaging of the abdomen and pelvis was performed
following the standard protocol without IV contrast. Sagittal and
coronal MPR images reconstructed from axial data set. Neither oral
nor intravenous contrast were administered for this study.

[Series 2: abd/pel w/o · axial · non-contrast · 0.98mm/px · z∈[-870,-415]mm · 12 of 106 slices shown, 14 images]
[im 8/106  soft-tissue]
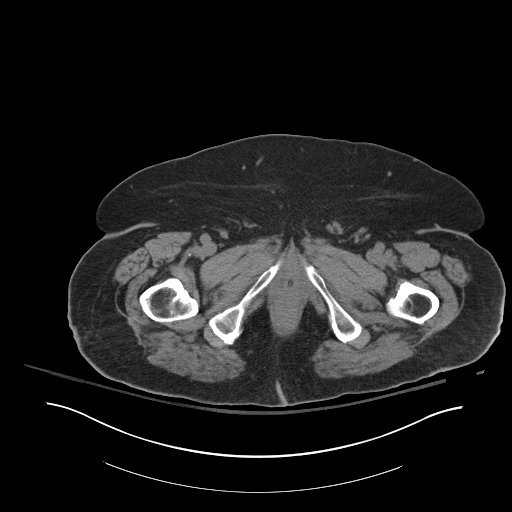
[im 8/106  bone]
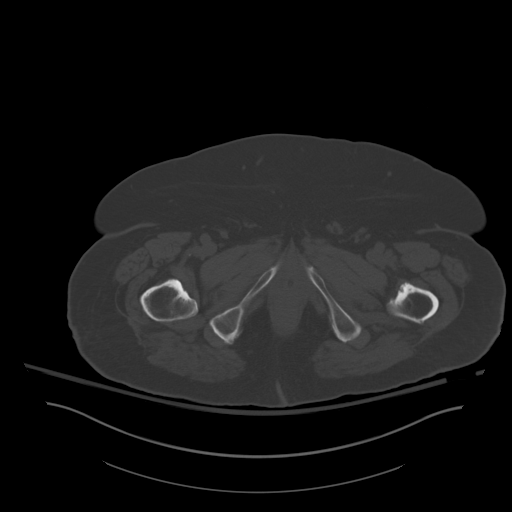
[im 15/106  soft-tissue]
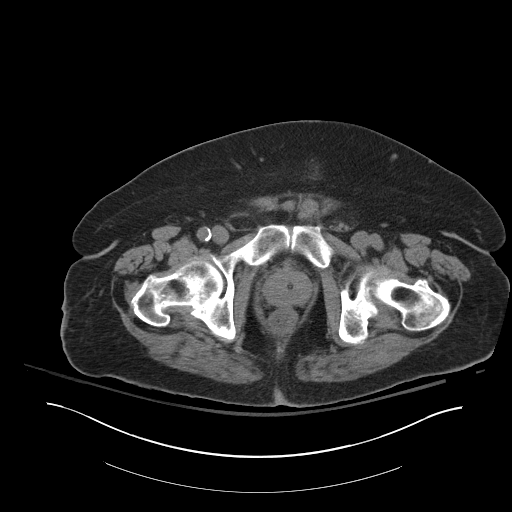
[im 22/106  soft-tissue]
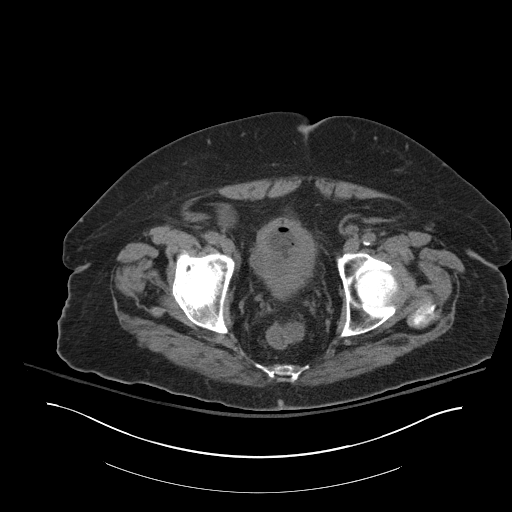
[im 36/106  soft-tissue]
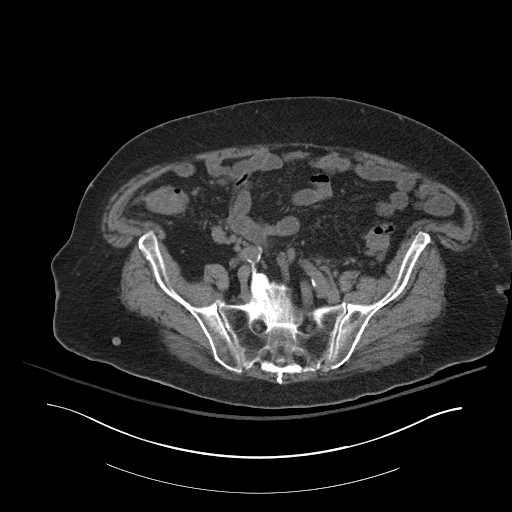
[im 43/106  soft-tissue]
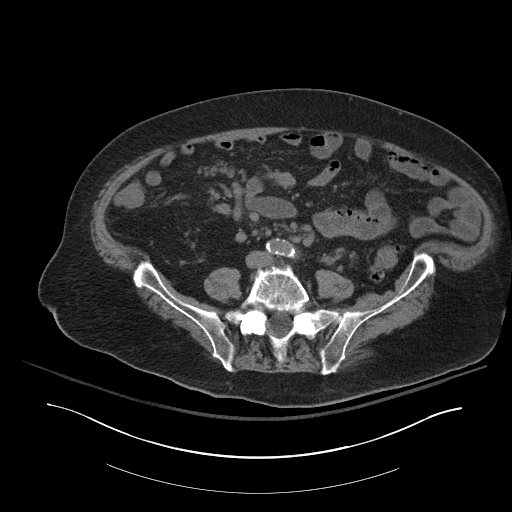
[im 50/106  soft-tissue]
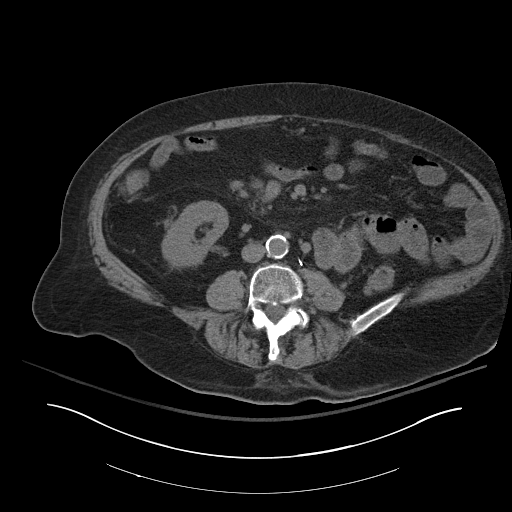
[im 57/106  soft-tissue]
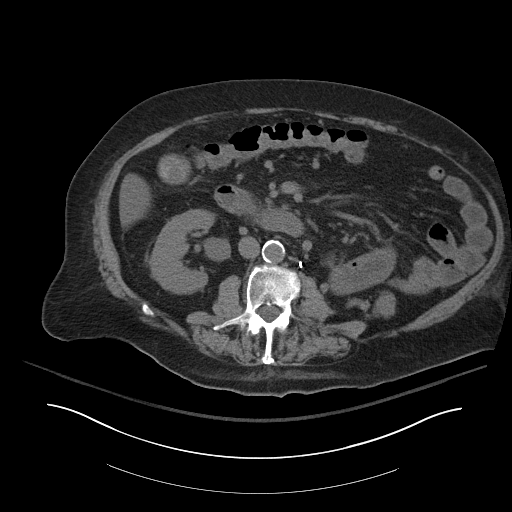
[im 64/106  soft-tissue]
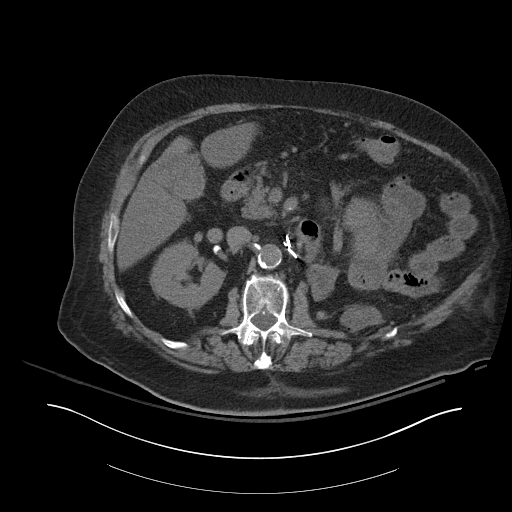
[im 71/106  soft-tissue]
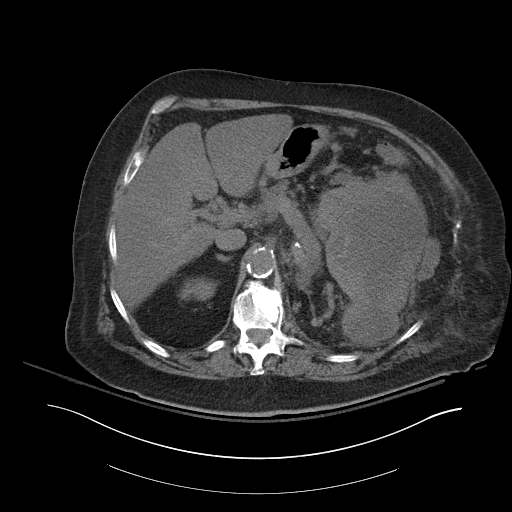
[im 71/106  bone]
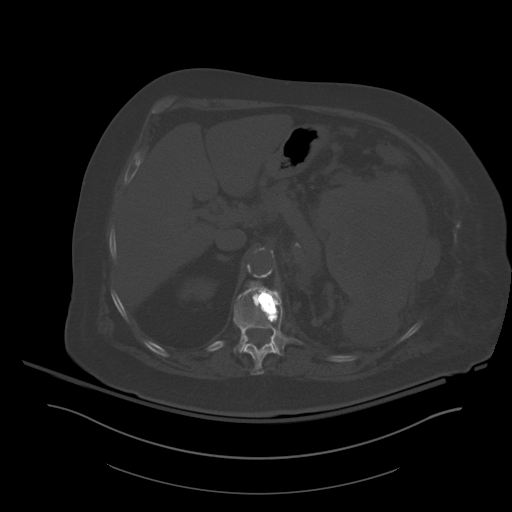
[im 85/106  soft-tissue]
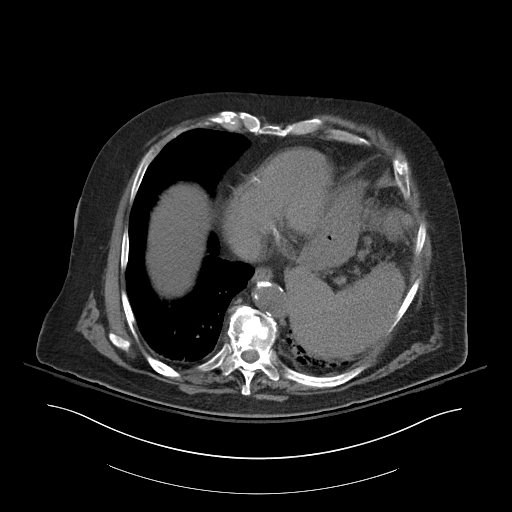
[im 92/106  soft-tissue]
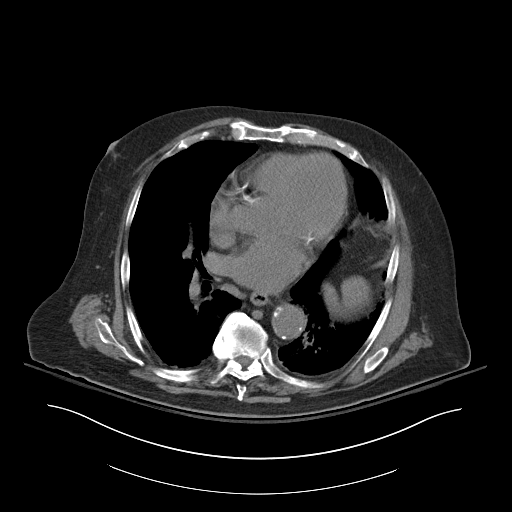
[im 99/106  soft-tissue]
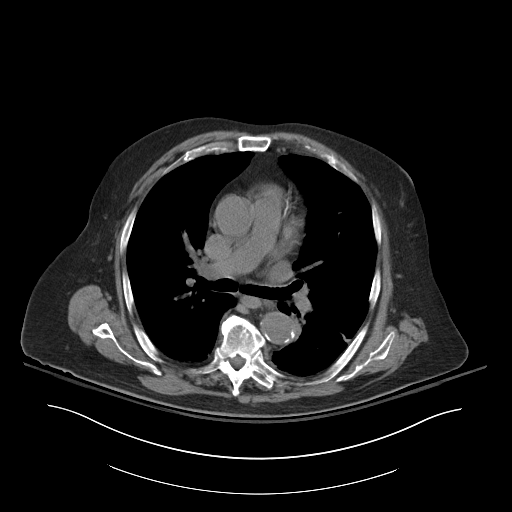

[Series 3: coronal · coronal · 1.03mm/px · 3 of 181 slices shown]
[im 61/181  soft-tissue]
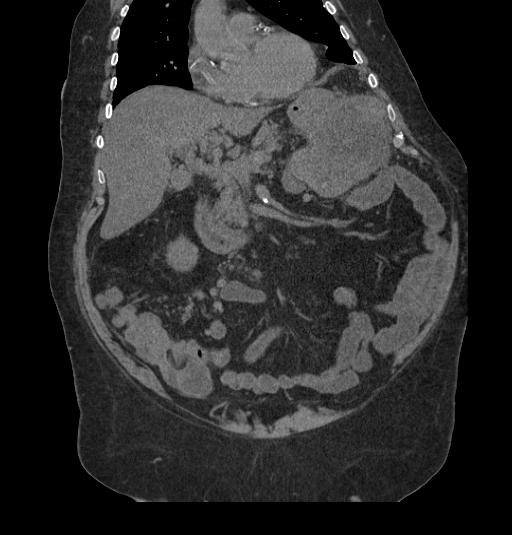
[im 81/181  soft-tissue]
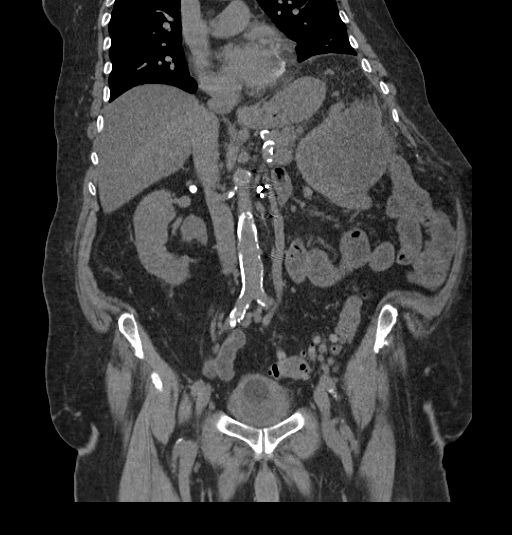
[im 101/181  soft-tissue]
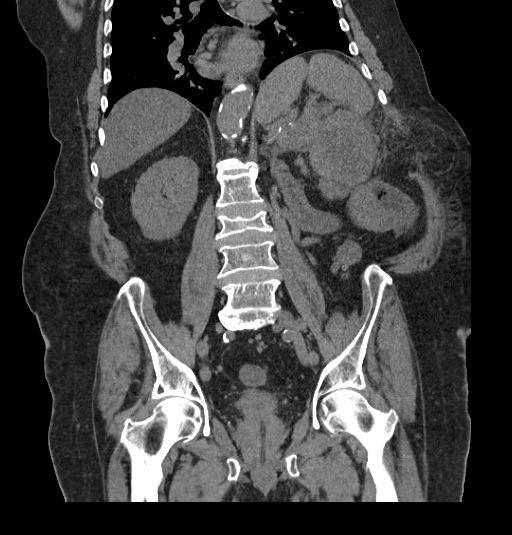

[15 of 46 positions shown; findings below may reference images not displayed]

FINDINGS: Lower chest: Bibasilar atelectasis, increased. Peribronchial
thickening particularly LEFT lower lobe. Mild interstitial
prominence at lung bases little changed.

Hepatobiliary: Minimal gallbladder wall calcification. Gallstone
seen previously not definitely visualized on current study. No
definite focal hepatic lesions.

Pancreas: Slight enlargement of the pancreatic tail with ductal
dilatation seen on previous exam less well visualized due the lack
of IV contrast. Pancreatic appearance little changed.

Spleen: No definite intra splenic abnormalities

Adrenals/Urinary Tract: Normal-appearing RIGHT adrenal gland. LEFT
adrenal gland not visualized. Normal RIGHT kidney and RIGHT ureter.
Foley catheter with[REDACTED]ompressed urinary bladder. Bladder wall
appears thickened though this may be an artifact from collapsed
state. Minimal prostatic enlargement.

Post LEFT nephrectomy. Large soft tissue mass LEFT upper quadrant
17.6 x 10.7 x 8.6 cm in size, mixed attenuation with areas of
low-attenuation centrally and irregular soft tissue attenuation
peripherally. Mass abuts anterior margin of spleen, lateral margin
of stomach, and pancreatic tail but epicenter of mass appears
external to these structures. Surrounding irregular infiltrative
changes of LEFT upper quadrant fat with increased number of vessels.
Overlying hernia of soft tissue and fat screw surgical defect in the
LEFT flank abdominal wall without bowel herniation. This could
represent lymphoma or potentially recurrent renal carcinoma.

Stomach/Bowel: Diverticulosis of sigmoid and descending colon
without definite evidence of diverticulitis. Stomach decompressed
limiting assessment of wall thickness. Bowel loops otherwise
unremarkable. Appendix not definitely visualized.

Vascular/Lymphatic: Mesenteric collaterals/varices particularly
RIGHT of midline. Atherosclerotic calcifications aorta, iliac
arteries, femoral arteries, coronary arteries, celiac artery, and
splenic artery. Aorta normal caliber. No adenopathy.

Reproductive: N/A

Other: No free air or free fluid.

Musculoskeletal: Diffuse osseous demineralization. Multiple
thoracolumbar compression fractures with prior spinal augmentation
procedures at 3 levels.
IMPRESSION: Post LEFT nephrectomy.

Marked interval increase in size of a soft tissue mass in the LEFT
upper quadrant with question central necrosis, now measuring 17.6 x
10.7 x 8.6 cm, favor lymphoma or recurrent renal cell carcinoma ;
while this mass abuts the pancreatic tail, lateral wall of stomach,
and anterior margin of spleen, the lesion appears to have an
epicenter external to these organs.

LEFT flank hernia through nephrectomy scar.

Chronic enlargement of pancreatic tail, by prior CT consisting of
ductal dilatation and mild parenchymal prominence, less well
visualized on current noncontrast technique exam.

Distal colonic diverticulosis.

Aortic atherosclerosis with additional atherosclerotic changes as
above.

Mesenteric collaterals/varices.
